# Patient Record
Sex: Female | Born: 1965
Health system: Southern US, Community
[De-identification: ages and names within clinical notes are randomized; demographics above are authoritative.]

## PROBLEM LIST (undated history)

## (undated) DIAGNOSIS — R45850 Homicidal ideations: Secondary | ICD-10-CM

## (undated) DIAGNOSIS — K219 Gastro-esophageal reflux disease without esophagitis: Secondary | ICD-10-CM

## (undated) DIAGNOSIS — F32A Depression, unspecified: Secondary | ICD-10-CM

## (undated) DIAGNOSIS — E039 Hypothyroidism, unspecified: Secondary | ICD-10-CM

## (undated) DIAGNOSIS — F329 Major depressive disorder, single episode, unspecified: Secondary | ICD-10-CM

## (undated) DIAGNOSIS — I10 Essential (primary) hypertension: Secondary | ICD-10-CM

## (undated) DIAGNOSIS — E119 Type 2 diabetes mellitus without complications: Secondary | ICD-10-CM

## (undated) DIAGNOSIS — F419 Anxiety disorder, unspecified: Secondary | ICD-10-CM

---

## 1999-07-20 ENCOUNTER — Inpatient Hospital Stay (HOSPITAL_COMMUNITY): Admission: EM | Admit: 1999-07-20 | Discharge: 1999-07-23 | Payer: Self-pay | Admitting: Psychiatry

## 1999-11-16 ENCOUNTER — Inpatient Hospital Stay: Admission: EM | Admit: 1999-11-16 | Discharge: 1999-12-17 | Payer: Self-pay | Admitting: Pulmonary Disease

## 1999-11-16 ENCOUNTER — Encounter: Payer: Self-pay | Admitting: Pulmonary Disease

## 1999-11-16 ENCOUNTER — Encounter: Payer: Self-pay | Admitting: Thoracic Surgery (Cardiothoracic Vascular Surgery)

## 1999-11-18 ENCOUNTER — Encounter: Payer: Self-pay | Admitting: Pulmonary Disease

## 1999-11-19 ENCOUNTER — Encounter: Payer: Self-pay | Admitting: Pulmonary Disease

## 1999-11-20 ENCOUNTER — Encounter: Payer: Self-pay | Admitting: Pulmonary Disease

## 1999-11-21 ENCOUNTER — Encounter: Payer: Self-pay | Admitting: Pulmonary Disease

## 1999-11-22 ENCOUNTER — Encounter: Payer: Self-pay | Admitting: Pulmonary Disease

## 1999-11-23 ENCOUNTER — Encounter: Payer: Self-pay | Admitting: Pulmonary Disease

## 1999-11-24 ENCOUNTER — Encounter: Payer: Self-pay | Admitting: Pulmonary Disease

## 1999-11-25 ENCOUNTER — Encounter: Payer: Self-pay | Admitting: Pulmonary Disease

## 1999-11-26 ENCOUNTER — Encounter: Payer: Self-pay | Admitting: Pulmonary Disease

## 1999-11-27 ENCOUNTER — Encounter: Payer: Self-pay | Admitting: Pulmonary Disease

## 1999-11-30 ENCOUNTER — Encounter: Payer: Self-pay | Admitting: Critical Care Medicine

## 1999-12-01 ENCOUNTER — Encounter: Payer: Self-pay | Admitting: Critical Care Medicine

## 1999-12-02 ENCOUNTER — Encounter: Payer: Self-pay | Admitting: Pulmonary Disease

## 1999-12-16 ENCOUNTER — Encounter: Payer: Self-pay | Admitting: Pulmonary Disease

## 1999-12-17 ENCOUNTER — Inpatient Hospital Stay (HOSPITAL_COMMUNITY): Admission: EM | Admit: 1999-12-17 | Discharge: 1999-12-20 | Payer: Self-pay | Admitting: Psychiatry

## 2001-10-16 ENCOUNTER — Inpatient Hospital Stay (HOSPITAL_COMMUNITY): Admission: EM | Admit: 2001-10-16 | Discharge: 2001-10-20 | Payer: Self-pay | Admitting: Psychiatry

## 2002-03-16 ENCOUNTER — Inpatient Hospital Stay (HOSPITAL_COMMUNITY): Admission: EM | Admit: 2002-03-16 | Discharge: 2002-03-20 | Payer: Self-pay | Admitting: Psychiatry

## 2003-04-14 ENCOUNTER — Inpatient Hospital Stay (HOSPITAL_COMMUNITY): Admission: AD | Admit: 2003-04-14 | Discharge: 2003-04-20 | Payer: Self-pay | Admitting: Psychiatry

## 2005-07-30 ENCOUNTER — Inpatient Hospital Stay (HOSPITAL_COMMUNITY): Admission: EM | Admit: 2005-07-30 | Discharge: 2005-08-03 | Payer: Self-pay | Admitting: Psychiatry

## 2005-07-30 ENCOUNTER — Ambulatory Visit: Payer: Self-pay | Admitting: Psychiatry

## 2006-03-01 ENCOUNTER — Ambulatory Visit: Payer: Self-pay | Admitting: *Deleted

## 2006-03-01 ENCOUNTER — Inpatient Hospital Stay (HOSPITAL_COMMUNITY): Admission: AD | Admit: 2006-03-01 | Discharge: 2006-03-08 | Payer: Self-pay | Admitting: *Deleted

## 2006-11-05 ENCOUNTER — Ambulatory Visit: Payer: Self-pay | Admitting: Psychiatry

## 2006-11-06 ENCOUNTER — Inpatient Hospital Stay (HOSPITAL_COMMUNITY): Admission: RE | Admit: 2006-11-06 | Discharge: 2006-11-12 | Payer: Self-pay | Admitting: Psychiatry

## 2007-02-04 ENCOUNTER — Ambulatory Visit: Payer: Self-pay | Admitting: Psychiatry

## 2007-02-04 ENCOUNTER — Inpatient Hospital Stay (HOSPITAL_COMMUNITY): Admission: AD | Admit: 2007-02-04 | Discharge: 2007-02-07 | Payer: Self-pay | Admitting: Psychiatry

## 2007-02-14 ENCOUNTER — Inpatient Hospital Stay (HOSPITAL_COMMUNITY): Admission: AD | Admit: 2007-02-14 | Discharge: 2007-02-21 | Payer: Self-pay | Admitting: Psychiatry
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
## 2010-06-03 NOTE — H&P (Signed)
NAMESUZANA, Jessica [name] NO.:  [id number]   MEDICAL RECORD NO.:  [id number]          PATIENT TYPE:  IPS   LOCATION:  0405                          FACILITY:  [facility]   PHYSICIAN:  Anselm Jungling, MD  DATE OF BIRTH:  1965/06/11   DATE OF ADMISSION:  11/06/2006  DATE OF DISCHARGE:                       PSYCHIATRIC ADMISSION ASSESSMENT   IDENTIFYING INFORMATION:  This is a voluntary admission to the services  of Dr. Geralyn Flash.  This is a 45 year old single white female.  The  patient was originally admitted to [facility] on November 03, 2006.  She  was found with a decreased level of consciousness after an intentional  drug overdose.  She took 45 40-mg tablets of Celexa.  This was after  getting into an argument with her father.  Apparently, he called her  stupid and she was treated with charcoal in the ED at [facility] and  admitted for further stabilization.  She was admitted to the ICU for  supportive care and was medically stable and allowed to transfer here to  the [facility] late yesterday, November 06, 2006.   PAST PSYCHIATRIC HISTORY:  She has had numerous prior inpatient stays,  both here, [facility] and [facility].  She was last with [place]  back in March 01, 2006 to March 08, 2006.   SOCIAL HISTORY:  She is a high school graduate in 1985.  She has never  married.  She has had no children.  She was last employed around age 48.  She gets disability and a check from her dad from his [employer].   FAMILY HISTORY:  Two of her brothers used alcohol and drugs.  She denies  using alcohol or drugs herself.   PRIMARY CARE PHYSICIAN:  Her primary care Jozette Castrellon is Dr. Harrison Mons.  She is  followed psychiatrically at [facility].   MEDICAL PROBLEMS:  Asthma, morbid obesity, hypothyroid,  hyperparathyroidism, hepatitis C and diabetes mellitus type 2.   MEDICATIONS:  Celexa 60 mg p.o. q.d., Topamax 100 mg, 1 p.o. q.a.m.,  1  p.o. q.p.m., calcitriol 0.5 mcg, 2 tablets daily, Synthroid 125 mcg p.o.  daily, hydrochlorothiazide 25 mg p.o. daily, Loxitane 25 mg, 1 p.o.  daily, 1-A-Day vitamin, Humalog mix 75/25, 12 units in the a.m., 14  units at lunch and 10 units at dinner, Lantus 36 units subcu daily.   ALLERGIES:  HALDOL.   POSITIVE PHYSICAL FINDINGS:  Her physical examination is well-documented  on the chart from [place].  On admission to our unit, her vital signs  showed that she is 4 feet 11 inches tall, she weighs 231 pounds,  temperature is 97.6, blood pressure is 132/85, pulse 79, respirations  are 16.  She has old scars from a prior tracheotomy, thyroid surgery and  a fracture of her elbow.  She is O2-dependent and she uses BiPAP at h.s.   MENTAL STATUS EXAM:  Today, she is alert and oriented x4.  She is  appropriately groomed, dressed and nourished.  Her speech is a normal  rate, rhythm and tone.  Her mood is appropriate  to the situation.  Her  affect remains somewhat blunted.  Thought processes are clear, rational  and goal-oriented.  She would like to have placements here in  [place].  Judgment and insight are fair.  Concentration and memory  are intact.  Intelligence is average.  She denies being actively  suicidal or homicidal.  She denies auditory or visual hallucinations.   DIAGNOSES:  AXIS I:  Schizophrenia.  Major depressive disorder.  AXIS II:  Deferred.  AXIS III:  Diabetes, type 2, asthma, hypothyroid, hyperparathyroidism,  hepatitis C.  AXIS IV:  Relationship issues with father, requiring placement changes.  AXIS V:  20.   PLAN:  To admit for further safety and stabilization.  Will have the  casemanager work on different placement here in [place] and will  adjust her medications as indicated.      Mickie Leonarda Salon, P.A.-C.      Anselm Jungling, MD  Electronically Signed    MD/MEDQ  D:  11/07/2006  T:  11/07/2006  Job:  [id number]

## 2010-06-06 NOTE — H&P (Signed)
[facility]  Patient:    Jessica [name], Jessica [name]                       MRN: [id number] Adm. Date:  [id number] Attending:  Marlyn Corporal Fabmy Dictator:   Johnella Moloney, N.P.                         History and Physical  IDENTIFYING INFORMATION:  Ms. Smoot is a 45 year old white single female, admitted December 17, 1999 on a voluntary basis after overdosing on Seroquel and Synthroid.  The chart lists two different times that she took the overdose, either November 16, 1999 or December 02, 1999; patient is unable to provide this information for me.  HISTORY OF PRESENT ILLNESS:  Patient apparently overdosed on Seroquel and Synthroid approximately two weeks ago.  She states she did it because "Im tired of living and tired of having schizophrenia."  The overdose apparently resulted in aspiration pneumonia with chronic respiratory failure; subsequently, she was hospitalized on a medical unit for approximately the past two weeks.  Patient currently denies suicidal ideation or intent.  She states, "I learned my lesson."  She reports getting angry when she gets upset. She did acknowledge that she slept well last night.  Appetite is good.  She did say last night that she saw "dead people trying to grab me and put me in the grave and blood dripping from the ceiling."  She states she believes that this is true since "I am a schizophrenic."  Also, reports hallucinations last night, "they told me to harm people, but I would never do that."  She states she is happy now; denies depression.  She says she does not plan to overdose again, "Ive learned my lesson."  She states she always has auditory and visual hallucinations and they generally do not go away.  Patient has a history of multiple suicide attempts by overdose, cutting wrists.  This patient is an unreliable historian, cannot remember a lot of things, so I do not think this information is as accurate as should be.  I am  getting my information from the chart, i.e., the assessment team and some of the notes that were sent to [facility].  PAST PSYCHIATRIC HISTORY:  Patient currently goes to [facility] and sees Dr. Hinton Lovely, Montez Hageman. and Myriam Forehand, who is her therapist.  She has been going there one year.  She states she saw Dr. Eliezer Bottom for four years, last seen in 1999.  Multiple hospitalizations including [facility], [facility] [place], [facility] and apparently some others which she cannot remember.  PAST MEDICAL HISTORY:  Patients primary care doctor is Dr. Nadine Counts in [place], who she said she saw two weeks ago.  Medical problems include obesity, diabetes mellitus, type 2, Pseudomonas pneumonia (aspiration pneumonia related to overdose), hypercholesterolemia, hepatitis C -- she said this was diagnosed 12 years ago, status post tracheotomy, November 2001, thyroidectomy in 1983 due to Graves disease, shortness of breath and asthma.  MEDICATIONS PRIOR TO THE OVERDOSE:  Seroquel, Celexa, Lamictal, Neurontin, Synthroid; also, O2 2 L via nasal cannula.  DRUG ALLERGIES:  HALDOL -- she does not know what happens when she takes Haldol.  SOCIAL HISTORY:  Patient lives with her parents in [place].  Two brothers and two sisters.  She is single, completed the 12th grade, apparently has some type of learning disability and disabled  since her 65s.  FAMILY HISTORY:  Brother is a paranoid schizophrenic.  Her paternal grandfather is schizophrenic.  ALCOHOL/DRUG HISTORY:  Patient denies alcohol abuse, no substance abuse and is a nonsmoker.  POSITIVE PHYSICAL FINDINGS:  Please see [facility] and medical unit records.  She apparently was admitted there December 02, 1999 through December 17, 1999 and please refer to their notes.  CBG this morning was 141.  Temperature 98, pulse 117, respirations 24, blood pressure  130/82.  CURRENT MENTAL STATUS EXAMINATION:  Short, obese white female with an nasal cannula oxygen, dressed in her pajamas, cooperative, however, and good eye contact; however, she appears to demand a lot of attention from staff.  Speech somewhat pressured; she talks nonstop; it is usually relevant.  Mood euthymic. Affect is euthymic.  She denies current suicidal ideation or intent.  She did tell me that the voices are telling her to hurt someone; however, she states she ignores them.  Thought process:  She is having auditory hallucinations as of last night and also visual hallucinations as of last night.  No apparent delusions currently.  Cognitive:  Alert and oriented.  Her judgement is poor. Impulse control is poor.  Cognitive function appears to be consistent with her level of intellectual functioning.  CURRENT DIAGNOSES Axes I:    1. Schizophrenia, chronic undifferentiated type.            2. Major depression, recurrent. Axis II:   Deferred. Axes III:  1. Diabetes mellitus.            2. Aspiration pneumonia, status post tracheotomy, November 2001.            3. History of a thyroidectomy, hepatitis C and asthma. Axis IV:   Severe, related to her mental illness and medical problems. Axis V:    Current 45 and probably highest in the past year is 55.  TREATMENT PLAN AND RECOMMENDATION:  Voluntary admission to [facility], check every 15 minutes, maintain safety -- patient is a high fall risk.  Will do CBGs a.c. breakfast and supper and coordinate this with Regular insulin sliding scale.  Start her on Geodon 20 mg p.o. b.i.d., Restoril 30 mg p.o. p.r.n. for sleep, also use the incentive spirometer q.2h. when awake, also pulse oximeter every shift, albuterol nebulizer 2.5 mg _____ q.i.d., Protonix 40 mg p.o. b.i.d., ______ 5 mg p.o. q.a.m., levothyroxine 100 mcg p.o. q.d., Colace 100 mg b.i.d., sodium chloride nasal spray, two sprays b.i.d., Neurontin 300 mg  q.i.d., Lamictal 25 mg two q.a.m. and 100 mg at h.s., 70/30 insulin subcu 28 units a.c. b.i.d., Regular scale insulin p.r.n. a.c. breakfast and supper, Artificial Tears p.r.n., albuterol 2.5 mg  nebulizer solution q.3h. p.r.n., Ativan 2 mg p.o. or IM q.4h. p.r.n.  TENTATIVE LENGTH OF STAY AND DISCHARGE PLAN:  Three days. DD:  12/18/99 TD:  12/18/99 Job: [id number]

## 2010-06-06 NOTE — Discharge Summary (Signed)
NAME:  Jessica [name], Jessica [name] NO.:  [id number]   MEDICAL RECORD NO.:  [id number]          PATIENT TYPE:  IPS   LOCATION:  0401                          FACILITY:  [facility]   PHYSICIAN:  Geoffery Lyons, M.D.      DATE OF BIRTH:  September 09, 1965   DATE OF ADMISSION:  07/30/2005  DATE OF DISCHARGE:  08/03/2005                                 DISCHARGE SUMMARY   CHIEF COMPLAINT AND PRESENT ILLNESS:  This was the second admission to [facility] for this 45 year old single white female voluntarily  admitted.  History of psychosis.  She had a nervous breakdown.  Became very  agitated over everything and everyone.  Tired of her illness.  Tired of  all the medications she has to take.  Sudden bout of depression, was hearing  noises, vibrations, or speaker-type noises.  Experiencing visual  hallucinations, seeing vampires and blood on the wall.  She was hearing  voices telling her to overdose.   PAST PSYCHIATRIC HISTORY:  Second admission.  Diagnosed with schizophrenia.   ALCOHOL/DRUG HISTORY:  Denies active use of any substances.   MEDICAL HISTORY:  Insulin-dependent diabetes mellitus, hepatitis C.   MEDICATIONS:  Neurontin 400 mg four times a day, Celexa 40 mg per day,  Synthroid 88 mcg daily, Abilify 10 mg at night, Risperdal 0.5 mg in the  morning, trazodone 50 mg at night, Humalog insulin 15 units subcutaneous in  the morning and 18 units subcutaneous at lunch, 18 units in the evening, and  Lantus 42 units at bedtime.   PHYSICAL EXAMINATION:  Performed and failed to show any acute findings.   LABORATORY DATA:  CBC with white blood cells 10.0, hemoglobin 14.1.  Blood  chemistry with sodium 137, potassium 3.8, glucose 177.  Liver enzymes with  SGOT 42, SGPT 47.  Drug screen negative for substances of abuse.   MENTAL STATUS EXAM:  Alert, cooperative female.  Fully alert.  Good eye  contact.  Speech clear, normal rate, tempo and production.  Mood anxiety.  Affect  anxiety.  Thought processes logical, coherent and relevant.  Endorsing hallucinations, fears, worries, concerns but no evidence of  delusions.  Cognition was well-preserved.   ADMISSION DIAGNOSES:  AXIS I:  Schizoaffective disorder.  AXIS II:  Borderline intellectual functioning.  AXIS III:  Hypothyroidism, diabetes mellitus.  AXIS IV:  Moderate.  AXIS V:  GAF upon admission 35; highest GAF in the last year 65-70.   HOSPITAL COURSE:  She was admitted.  She was started in individual and group  psychotherapy.  She was maintained on her medications.  Neurontin was placed  at 300 mg three times a day and 600 mg at bedtime.  She endorsed that she  was doing well for a long time.  Was switched from Seroquel to Abilify.  Did  work for a year.  Recently increased to 3 tabs.  Cannot identify a possible  stressor.  Got very upset.  Mood swings with hallucinations.  On July 31, 2005, she was very sedated by the medication.  We moved medications around.  By  the 14th, she was better.  Feeling that the medications were working,  interacted with other patients and staff.  On August 03, 2005, she was in full  contact with reality.  There were no suicidal or homicidal ideation, no  hallucinations, no delusions.  She felt better.  Her mood improved.  Affect  was brighter, broader.  Sleeping through the night.  No hallucinations.  We  went ahead and discharged to outpatient follow-up.   DISCHARGE DIAGNOSES:  AXIS I:  Schizoaffective disorder.  AXIS II:  Rule out borderline intellectual functioning.  AXIS III:  Hypothyroidism, insulin-dependent diabetes mellitus, hepatitis C.  AXIS IV:  Moderate.  AXIS V:  GAF upon discharge 50.   DISCHARGE MEDICATIONS:  1. Celexa 40 mg per day.  2. Abilify 10 mg at bedtime.  3. Risperdal 0.5 mg at 6 p.m.  4. Trazodone 50 mg at night.  5. Neurontin 300 mg three times a day and 600 mg at bedtime.  6. Synthroid 88 mcg daily.  7. Continue insulin as scheduled.    FOLLOWUP:  [facility].      Geoffery Lyons, M.D.  Electronically Signed     IL/MEDQ  D:  08/14/2005  T:  08/15/2005  Job:  [id number]

## 2010-06-06 NOTE — H&P (Signed)
NAME:  STORIE, HEFFERN NO.:  [id number]   MEDICAL RECORD NO.:  [id number]          PATIENT TYPE:  IPS   LOCATION:  0401                          [facility]ACILITY[facility]  [facility]   PHYSICIAN:  Geoffery Lyons, M.D.      DATE OF BIRTH:  06/06/1965   DATE OF ADMISSION:  07/30/2005  DATE OF DISCHARGE:  08/03/2005                         PSYCHIATRIC ADMISSION ASSESSMENT   IDENTIFYING INFORMATION:  A 45 year old single white female voluntarily  admitted on July 29, 2005.   HISTORY OF PRESENT ILLNESS:  The patient presents with a history of  psychosis.  The patient states she had a nervous breakdown and became very  agitated over everything and everyone.  She states she is tired of her  illness and tired of all the medications she has to take.  She states she  had a sudden bout of depression, was hearing noises, vibrations or speaker-  type noises, also experiencing visual hallucinations, seeing vampires and  blood on the walls.  The patient states she was hearing voices telling her  to overdose and the patient states all she wants to do is just to get  better.   PAST PSYCHIATRIC HISTORY:  The patient's second admission, has a history of  borderline personality and schizophrenia.  She sees Dr. Claudie Revering at [facility].  The patient has a history of overdosing.  The patient also  has a therapist named Lauree Chandler.   SOCIAL HISTORY:  She is a 45 year old single white female with no children.  She lives alone.  She has a 12th grade education currently on disability.   FAMILY HISTORY:  Brother with schizophrenia and bipolar disorder.   ALCOHOL DRUG HISTORY:  Nonsmoker, denies any alcohol or drug use.   PAST MEDICAL HISTORY:  Primary care Librada Castronovo is Dr. Harrison Mons in [place]. Also  has an endocrinologist, Dr. Lelan Pons in [place]. Medical problems are insulin-  dependent diabetes mellitus, hepatitis C.  The patient was recently  diagnosed with her diabetes.   MEDICATIONS:  Neurontin  400 mg q.i.d., Celexa 40 mg daily, Synthroid 88 mcg  daily, Abilify 10 mg at bedtime, Risperdal 0.5 mg in the morning, trazodone  50 mg at bedtime, Humalog insulin 15 units subcutaneous in the morning,  Humalog insulin 18 units subcutaneous at lunch, Humalog 18 units  subcutaneous in the evening.  Also takes Lantus 42 units at bedtime.   DRUG ALLERGIES:  No known allergies.   PHYSICAL EXAMINATION:  The patient was fully assessed at [facility].  This is a short-statured middle-aged female in no acute distress.  Her  temperature was 98, pulse of 100, 20 respirations, blood pressure 114/81..  She is 252 pounds, approximately 5 feet 4 inches tall.  Urine pregnancy test  was negative, alcohol level less than 0.1, acetaminophen level less than 10,  glucose was 177.  Her RDW is elevated at 15.1.   MENTAL STATUS EXAM:  She is fully alert, cooperative, good eye contact,  casually dressed.  Speech is clear, normal rate and tone.  The patient's  mood is neutral.  The patient is displaying some mild  anxiety.  Thought  process: Endorsing positive auditory or visual hallucinations, cognitive  function intact.  Her memory is good, judgment and insight is fair.   ADMISSION DIAGNOSES:  AXIS I:  Schizoaffective disorder.  AXIS II:  Borderline personality per chart and per patient.  AXIS III: Hypothyroidism, insulin-dependent diabetes mellitus, and hepatitis  C.  AXIS IV:  Other psychosocial problems, medical problems.  AXIS V:  Current is 35.   PLAN:  Plan is to stabilize mood and thinking.  We will resume her  medications, will check her blood sugars a.c. and h.s., put the patient on  sliding scale.  At this time continue with her Lantus.  The patient is to  follow up with her providers as advised.  Emotional reassurance.   TENTATIVE LENGTH OF CARE:  5-7 days.      Landry Corporal, N.P.      Geoffery Lyons, M.D.  Electronically Signed    JO/MEDQ  D:  08/03/2005  T:  08/03/2005  Job:   [id number]

## 2010-06-06 NOTE — Op Note (Signed)
[facility]  Patient:    Jessica [name], Jessica [name]                       MRN: [id number] Proc. Date: 11/27/99 Adm. Date:  [id number] Attending:  Merwyn Katos CC:         Barbaraann Share, M.D. [facility]   Operative Report  PREOPERATIVE DIAGNOSES: 1. Ventilatory dependent. 2. Acute respiratory distress syndrome. 3. Drug overdose.  POSTOPERATIVE DIAGNOSES: 1. Ventilatory dependent. 2. Acute respiratory distress syndrome. 3. Drug overdose.  OPERATION:  Tracheotomy.  SURGEON:  Veverly Fells. Arletha Grippe, M.D.  ASSISTANT:  Kinnie Scales. Annalee Genta, M.D.  ANESTHESIA:  General endotracheal.  INDICATION FOR PROCEDURE:  This is a 45 year old white female with multiple medical problems who has been intubated for the past two weeks despite aggressive attempts at weaning by the pulmonary service and due to her lung function and ARDS and pulmonary edema, she continues to be ventilatory dependent.  Based on her history and physical examination and failure of weaning, we have recommended proceeding with a tracheotomy tube placement.  I have discussed extensively with the family, the risks and benefits for surgery including risk of general anesthesia, infection, bleeding, injury to larynx and trachea and normal recovery period after this type of surgery.  I have entertained questions and answered them appropriately.  Informed consent has been obtained.  The patient presents for the above noted procedure.  OPERATIVE FINDINGS:  Normal anatomy.  DESCRIPTION OF PROCEDURE:  The patient was brought to the operating room and placed in the supine position.  General endotracheal anesthesia administered via the anesthesiologist without complications.  Her shoulders were placed on a shoulder roll.  Her head was placed on a donut.  The patients back was sterilely prepped and draped in a standard fashion.  Prior skin incision where the thyroidectomy had been performed was noted and an  incision approximately 2 to 3 cm in length just above this incision was marked out using a marking pen. It was injected with approximately 4 cc of 1% Lidocaine solution and 1:100,000 epinephrine.  Skin incision was made with a scalpel blade and subcutaneous tissues were dissected free using electrocautery.  Bleeding was controlled with electrocautery.  Strap muscles were identified in the midline and divided and retracted laterally.  Thyroid isthmus had already been resected due to a prior thyroidectomy, therefore an isthmectomy was not needed.  Bleeding was controlled in the area with electrocautery without difficulty.  An incision between the second and third intercartilaginous rings was made with a scalpel blade.  Inferiorly a base trap door flap was created with lateral cuts through the inferior incision of the third ring.  This was sutured lightly to the anterior neck of the skin with a 2-0 silk stitch.  Bleeding from the area of the trachea was controlled with electrocautery without difficulty. The nurse anesthetist was instructed to remove the endotracheal tube slowly.  Once she had it passed above the opening of the tracheotomy site, a size 6 cuff nonfenestrated Shiley tracheal tube was placed within the incision without difficulty.  The cuff was inflated. The inner cannula was placed and it was hooked up to the anesthesia circuit.  Breath sounds were also obtained in both lung fields with good pCO2 return on the monitor and good oxygenation was noted.  The tracheostomy was then sutured using four quadrant silk sutures to the anterior neck skin. A piece of Xeroform gauze was placed underneath the tracheostomy flange  and a foam tracheostomy tie was placed around the patients neck.  Fluids given during the procedure were approximately 300 cc of crystalloid.  Estimated blood loss was minimal.  Urine output not measured. There were no drains.  Above noted Xeroform packing was placed.  No  specimens sent.  The patient tolerated the procedure well without complications and was transferred back, intubated to the 2100 intensive care unit in stable condition.  Sponge, instrument and needle counts were correct during the procedure.  Total duration of the procedure is approximately one hour. It is of note, that portable chest x-ray confirmed placement of the tracheotomy tube and really a pneumothorax is still pending at the time of this dictation. DD:  11/27/99 TD:  11/28/99 Job: [id number]

## 2010-06-06 NOTE — Discharge Summary (Signed)
NAME:  Jessica [name], Jessica [name] NO.:  [id number]   MEDICAL RECORD NO.:  [id number]                   PATIENT TYPE:  IPS   LOCATION:  0506                                 [facility]ILITY[facility]   PHYSICIAN:  Geoffery Lyons, M.D.                   DATE OF BIRTH:  24-May-1965   DATE OF ADMISSION:  03/16/2002  DATE OF DISCHARGE:  03/20/2002                                 DISCHARGE SUMMARY   CHIEF COMPLAINT AND PRESENT ILLNESS:  This was one of multiple admissions to  [facility] for this 45 year old white female,  single, voluntarily admitted.  She was claiming, I don't want to live  anymore.  She overdosed on Seroquel and other psychotropic medications.  She had a phone conversation immediately prior to the overdose with her best  friend that upset her.  Apparently, the best friend told her they could not  be friends anymore; no clear reason why.  She got upset as she also lost  another friend to suicide a couple of years before.  This was the last  friend she had.  Upon this initial evaluation, she endorsed suicidal ideas  but no plan, willing to contract for safety.   PAST PSYCHIATRIC HISTORY:  She had multiple admissions to [facility].  She had a long history of chronic  undifferentiated schizophrenia.  She was followed by [facility].   SUBSTANCE ABUSE HISTORY:  She denied the use or abuse of any substances.   PAST MEDICAL HISTORY:  1. Hepatitis C.  2. Grave's disease.  3. Hyperparathyroidism.  4. Diabetes mellitus type 2.  5. Obesity.  6. Sleep apnea.   MEDICATIONS:  1. Synthroid 112 mcg daily.  2. Celexa 40 mg per day.  3. Risperdal 1 mg at night.  4. Calcitriol 0.5 mcg one in the morning.  5. Calcium carbonate 500 mg three times a day.  6. Seroquel 200 mg one in the morning and 600 mg at night.  7. Humulin insulin 22 units in the morning and 25 units at supper.   PHYSICAL  EXAMINATION:  Physical examination was performed, failed to show  any acute findings.   MENTAL STATUS EXAM:  Mental status exam revealed an obese female, blunted  affect, poor concentration, cooperative, poor eye contact, generally  detached affect.  She appeared to have an apathetic manner.  Speech was  normal.  Mood was depressed, frustrated, grieving, very distressed about the  loss of her friend.  She felt she could be safe in the hospital but not at  home.  She claimed to feel tired as she could not stand to live without her  friend.  Logical and coherent, although very concrete.  Positive for  suicidal ideas, no plan, no homicidal ideas.  Cognitive: Cognition was well  preserved.    ADMISSION DIAGNOSES:  AXIS I:  Schizophrenia, chronic, undifferentiated.   AXIS II:  Borderline intellectual functioning.   AXIS III:  1. Hepatitis C.  2. Edema.  3. Diabetes mellitus type 2.  4. Grave's disease.  5. Hyperparathyroidism.   AXIS IV:  Moderate.   AXIS V:  Global assessment of functioning upon admission 28, highest global  assessment of functioning in the last year 55-60.   LABORATORY DATA:  Other laboratory workup: Thyroid profile was within normal  limits.  Pregnancy test was negative.  Other lab values were within normal  limits.   HOSPITAL COURSE:  She was admitted and started in intensive individual and  group psychotherapy.  She was basically kept on her medications: Humulin  insulin 70/30 22 units in the morning and 25 units in the afternoon,  Seroquel 200 mg in the morning and 600 mg at night, Risperdal 1 mg in the  afternoon, Neurontin 400 mg four times a day, calcitriol 0.5 mg in the  morning, calcium carbonate 500 mg three times a day, Synthroid 112 mcg  daily.  She evidenced some agitation that required that dose of Seroquel.  She did admit to multiple issues but especially the loss of the  relationship.  She continued to evidence depression, suicidal  ruminations,  but then she started getting involved in the individual and group process on  the unit.  She was able to talk about the grief and loss.  She started  feeling better.  She denied any active suicidal or homicidal ideas.  There  were no delusions, no hallucinations.  She started to request to be  considered for discharge.  On March 1, she was in full contact with reality,  no suicidal ideas, no homicidal ideas, dealt with the loss of the  relationship.  She felt she was ready to move on.  She would continue to  work on an outpatient basis.   DISCHARGE DIAGNOSES:   AXIS I:  Schizophrenia, chronic, undifferentiated, by history.   AXIS II:  Borderline intellectual functioning.   AXIS III:  1. Hepatitis C.  2. Edema.  3. Diabetes mellitus type 2.  4. Grave's disease.  5. Hyperparathyroidism.   AXIS IV:  Moderate.   AXIS V:  Global assessment of functioning upon discharge 50.   DISCHARGE MEDICATIONS:  1. Lasix 20 mg per day.  2. Insulin Humulin 70/30 35 units in the morning and 28 in the afternoon.  3. Seroquel 200 mg one at night and three at bedtime.  4. Risperdal 1 mg at bedtime.  5. Neurontin 400 mg four times a day.  6. Rocaltrol 0.5 mg daily.  7. Synthroid 112 mcg daily.  8. __________  40 mg daily.   FOLLOW UP:  She was to follow up at [facility].                                                 Geoffery Lyons, M.D.    IL/MEDQ  D:  04/19/2002  T:  04/19/2002  Job:  [id number]

## 2010-06-06 NOTE — Discharge Summary (Signed)
[facility]  Patient:    Jessica [name], Jessica [name]                       MRN: [id number] Adm. Date:  [id number] Disc. Date: [id number] Attending:  Marlyn Corporal Fabmy                           Discharge Summary  ADMISSION DIAGNOSES: Axis I:    1. Schizophrenia, chronic, undifferentiated-type.            2. Major depression, recurrent. Axis II:   Deferred. Axis III:  1. Diabetes mellitus.            2. Aspiration pneumonia.            3. History of thyroidectomy.            4. Hepatitis C.            5. Bronchial asthma. Axis IV:   Severe (related to her mental illness). Axis V:    Global Assessment of Functioning 45 on admission; 50-55 during the            past year.  DISCHARGE DIAGNOSES: Axis I:    1. Schizophrenia, chronic, undifferentiated-type.            2. Major depression, recurrent. Axis II:   Deferred. Axis III:  1. Diabetes mellitus.            2. Aspiration pneumonia with status post tracheostomy on November               2001.            3. History of thyroidectomy.            4. Hepatitis C.            5. Bronchial asthma. Axis IV:   Severe. Axis V:    Global Assessment of Functioning 45 on admission, 50 on discharge.  BRIEF HISTORY:  This is one of several psychiatric admissions for this 45 year old single Caucasian female who lives with her parents.  Patient overdosed on Seroquel and Synthroid and developed aspiration pneumonia which was managed at [facility] with a tracheostomy.  She is now transferred to the Behavioral Unit for further evaluation and management.  At the time of her admission, she denied suicidal ideation.  She was apparently hallucinating and seeing that people trying to grab her and put her in a grave with blood dripping from the ceiling.  In terms of her suicidal aspirations, the patient stated that she has learned her lesson and that she will not overdose again.  Patient is under the care of Dr. ________ at  [facility].  PERTINENT PHYSICAL AND LABORATORY DATA:  Laboratory data was not done since she had extensive lab work at [facility].  Patient was maintained on oxygen 2 liters and maintained on the medications that she was discharged on from [facility] throughout her hospitalization.  COURSE IN HOSPITAL:  Patient was limited, needy, dependent and demanding. Otherwise, she did not show any evidence of phenomic behavior and was compliant and pleasant throughout her hospital stay.  She seemed to tolerate her medications well and with benefit.  CONDITION ON DISCHARGE:  Patient is pleasant, interactive and shows evidence of good reality connectedness.  There is no evidence of any hallucinations and any fear and she is not delusional.  Affect is full.  She  regrets her overdose and promises never to overdose again.  MEDICATION AND FOLLOW-UP:  Patient is discharged with prescriptions for oxygen 2 liters via nasal cannula, Geodon 2 mg b.i.d., albuterol inhaler 2.5 mg q.i.d., Protonix 40 mg b.i.d., glipizide XL 5 mg daily, levothyroxine 100 mcg daily, Colace 100 mg b.i.d., sodium chloride nasal spray b.i.d., Neurontin 300 mg q.i.d., lamotrigine 25 mg b.i.d., lamotrigine 100 mg q.h.s. and insulin SL 70/30 28 U b.i.d.  She is to follow with mental health and with her own physician.  She is to call me should a crisis develop. DD:  12/20/99 TD:  12/20/99 Job: [id number]

## 2010-06-06 NOTE — Discharge Summary (Signed)
[facility]  Patient:    Jessica [name], Jessica [name]                       MRN: [id number] Adm. Date:  [id number] Disc. Date: [id number] Attending:  Marlyn Corporal Fabmy                           Discharge Summary  ADMISSION DIAGNOSES: Axis I:    Paranoid schizophrenia. Axis II:   Learning disabled. Axis III:  1. Type 1 diabetes mellitus.            2. History of thyroid surgery.            3. Possible hepatitis C. Axis IV:   Severe (problems with primary support group). Axis V:    GAF 20 on admission; 55 during past year.  DISCHARGE DIAGNOSES: Axis I:    Paranoid schizophrenia. Axis II:   Learning disability. Axis III:  1. Type 1 diabetes mellitus.            2. History of thyroid surgery.            3. Possible hepatitis C. Axis IV:   Severe. Axis V:    GAF 20 on admission; 50-55 on discharge.  BRIEF HISTORY:  The patient is a 45 year old single Caucasian female admitted voluntarily to my service from [facility] where she presented expressing suicidal and homicidal ideas.  The patient had a plan to overdose on her insulin and also had a homicidal plan to burn her house down with family inside or to shoot her family and her neighbors.  Patient was able to contract for her own safety on admission.  PERTINENT PHYSICAL AND LABORATORY DATA:  Physical examination, on admission, was unremarkable.  We ordered magnesium and phosphorus levels.  Phosphorus level was elevated at 5.1 with a magnesium being normal.  Thyroid profile showed moderately high TSH at 7.219 and she was advised to follow with her internist with another thyroid level in a months time.  COURSE IN HOSPITAL:  Patient was placed on Neurontin 300 mg q.a.m. and 300 mg q.h.s., Synthroid 0.075 mg q.d., 70/30 insulin 24 U q.a.m. and 20 U q.p.m., Seroquel 200 mg q.a.m. and 3 q.h.s. and Lamictal 100 mg 1/2 q.a.m. and q.h.s. She had Accu-Cheks q.a.c. and q.h.s. and was felt to be a fall risk  and treated accordingly.  Later, Celexa 40 mg q.h.s., trazodone 50 mg q.h.s. p.r.n. and calcium 500 mg q.i.d. with vitamin D 1000 mg q.d. was added to her medication.  She was placed on a course of amoxicillin.  Initially suicidal and homicidal, the patient rapidly compensated after a good nights sleep. The next day shows irritable outburst and stated that her brother had upset her and she lost her temper.  Within a day or two, she had showed me that there was no validity to her threats.  CONDITION ON DISCHARGE:  The patient was much calmer and she was sleeping through the night.  She denies experiencing any hallucinations.  Her thinking was tight and goal directed.  There was no evidence of any schneiderian criteria.  Her affect was full.  Her mood was euthymic.  There was no evidence of impulsivity.  She was tolerating her medication without evidence of EPS, tardive dyskinesia or sedation.  MEDICATION AND FOLLOW-UP:  Patient was discharged with prescriptions for Os-Cal with vitamin D q.i.d., amoxicillin 500 mg t.i.d. x 10 days,  Celexa 40 mg q.d., Lamictal 100 mg 1/2 b.i.d., Seroquel 200 mg 1 q.a.m. and 3 q.a.m., 70/20 insulin 24 U q.a.m. and 20 U at 4 p.m., Synthroid 0.075 mg q.d. and Neurontin 300 mg 2 q.a.m. and 3 q.h.s.  She was to follow at mental health and to see her internist, Dr. Harrison Mons.  She was given a copy of her thyroid profile. She was advised not to drink any alcoholic beverages. DD:  09/19/99 TD:  09/22/99 Job: [id number]

## 2010-06-06 NOTE — Discharge Summary (Signed)
NAMEMarland [name]  [name], Jessica NO.:  [id number]   MEDICAL RECORD NO.:  [id number]          PATIENT TYPE:  IPS   LOCATION:  0404                          [facility]ACILITY:  [facility]   PHYSICIAN:  Jasmine Pang, M.D. DATE OF BIRTH:  June 23, 1965   DATE OF ADMISSION:  03/01/2006  DATE OF DISCHARGE:  03/08/2006                               DISCHARGE SUMMARY   IDENTIFYING INFORMATION:  [age]-year-old single white female who was  admitted on a voluntary basis on March 01, 2006.   HISTORY OF PRESENT ILLNESS:  Patient has a history of schizophrenia.  She presented in the ED feeling unstable and frustrated.  She had not  been sleeping.  She stated the neighbors are causing conflict with a lot  of noise and she has been unable to sleep properly for 2 weeks.  She is  feeling out of control with homicidal ideation towards the neighbors.  There is no plan.  The patient is seen in outpatient treatment at  [facility] by a therapist and psychiatrist.  This is  her third [facility] admission. As indicated above, she has a history of  schizophrenia.  She is currently on Abilify 30 mg p.o. nightly, Celexa  60 mg in the morning, Topamax 100 mg twice daily, trazodone 100 mg p.o.  nightly, and Risperdal 1 tablet at 5:00 p.m. and 1 tablet daily as  needed for agitation or paranoia.   PHYSICAL FINDINGS:  Patient suffers from a dermatitis and diabetes type  1, which is controlled.  She also suffers from obesity and  hypothyroidism.  Her physical exam was done in the ED and there were no  acute medical problems.   ADMISSION LABORATORIES:  Comprehensive metabolic panel was positive for  low potassium of 3 (3.5-5.1), chloride of 94 (96-112), carbon dioxide 33  (19-32), glucose 128 (70-99), and ALT of 41 (0-35).  The rest of the  comprehensive metabolic panel was within normal limits.  CBC was  remarkable for a slightly low RBC count of 3.7 (3.87-5.11).  Platelet  count was low at 100  (150-400).  TSH was 5.206 which was within normal  limits.  A corrected potassium was 4.  Platelet count is pending.   HOSPITAL COURSE:  Upon admission, patient was continued on Abilify 30 mg  p.o. nightly, Celexa 60 mg p.o. q.a.m., Topamax 100 mg q.a.m. and h.s.,  Synthroid 100 mcg in the morning, Calcium D 1 pill daily, trazodone 100  mg at h.s., Risperdal 1 tablet p.o. at 1700 and 1 tablet p.r.n. daily as  needed.  On February 11,2008 patient was placed on hydrochlorothiazide  25 mg 1 tab weekly.  She was also changed to Risperdal 0.5 mg 1 tablet  at 1700 and 0.5 mg p.o. p.r.n. agitation or psychosis.  On March 02, 2006, Risperdal was increased to 1 M-Tab at 5:00 p.m. and 1 M-Tab once  daily p.r.n. agitation.  She was also placed on Lantus insulin 36 units  p.o. nightly, and placed on a glycemic control protocol.  She was  ordered O2 1 liter per minute via nasal  cannula around the clock.  On  March 03, 2006 she was ordered K-Dur 40 mEq p.o. b.i.d. x1 day.  Potassium was to be rechecked.  On March 04, 2006, patient was  started on loxapine 25 mg p.o. nightly with discontinuation of the  Risperdal standing dose.  On March 07, 2005, a repeat platelet count  was ordered but it had not returned at the time of this dictation.  Patient tolerated her medications well with no significant side effects.   Upon first meeting patient, she discussed being unable to sleep because  of her neighbors playing music for the past 2 weeks.  She complained to  the landlord but states he did nothing.  She is apparently now paranoid  about her neighbors wanting to jump her.  She admits increased anger  towards them and having some homicidal thoughts towards them.  She  denies substance abuse.  Information from the patient's landlord was  that there is no loud music playing or neighbors being loud and that she  is apparently hearing voices.  On March 03, 2006, patient states she  felt somewhat  better.  She indicated that a her mother and brother felt  it was my voices making the music.  She denied auditory/visual  hallucinations but had heard music last night.  She had no suicidal or  homicidal ideation.  On March 04, 2006, patient slept well.  She was  eating better.  She felt now that the noise she heard was due to her  voices.  At first she had thought they were real she stated.  She does  not feel quite ready to return to the apartment yet.  On March 05, 2006, patient was in a good mood, because she was happy about a plan to  go home and live with her mother.  She states she always enjoyed living  there, and her mother reports to case manager that she has enjoyed  having Lori there.  The patient states she moved out because she had  some difficulty getting along with her father, but this is resolved now.  The therapist from the mental health center called to speak to our case  manager.  Patient's Risperdal had been discontinued and changed to  loxapine as indicated above.  She was having no psychosis.  On February  16 and March 07, 2006, patient's mental status continued to be  improved.  The.  She was excited about going home.  On March 08, 2006, mental status improved markedly from admission status.  The  patient was friendly and cooperative.  She had good eye contact.  Speech  normal rate and flow.  Psychomotor activity was within normal limits.  Her mood was euthymic.  Affect wide range.  There was no suicidal or  homicidal ideation.  No thoughts of self-injurious behavior.  No  auditory or visual hallucinations.  No paranoia or delusions.  Thoughts  were logical and goal-directed.  Thought content predominantly happy  bout going home to live with her mother and father.  Cognitive was  grossly back to baseline.   DISCHARGE DIAGNOSES:  AXIS I:  Schizophrenia paranoid type.  AXIS II:  None. AXIS III:  Diabetes mellitus type 1, controlled; dermatitis;  obesity;  hypothyroidism, thrombocytopenia.  AXIS IV:  Moderate (problems related to social environment, housing  problem, burden of psychiatric illness, medical problems).  AXIS V: Global assessment of functioning upon admission was 25.  Global  assessment of functioning upon discharge  was 50.  Global assessment of  functioning highest past year was 60.   DISCHARGE/PLAN:  There were no specific activity level restrictions, and  dietary restrictions revolved around needing a diabetic diet with no  extra sugar.   POST HOSPITAL CARE PLANS:  [facility].  This  appointment is being arranged by the patient's case manager here.   DISCHARGE MEDICATIONS:  1. Abilify 30 mg at bedtime.  2. Topamax 100 mg in the morning and at bedtime.  3. Levothyroxine 100 mcg daily in the morning.  4. Os-Cal Plus D daily.  5. Trazodone 100 mg at bedtime.  6. Celexa 40 mg 1-1/2 pills daily.  7. Lantus insulin 36 mg p.o. nightly.  8. Loxitane 25 mg at bedtime.  9. Hydrochlorothiazide 25 mg weekly.      Jasmine Pang, M.D.  Electronically Signed     BHS/MEDQ  D:  03/08/2006  T:  03/08/2006  Job:  [id number]

## 2010-06-06 NOTE — H&P (Signed)
NAME:  Jessica [name], Jessica [name] NO.:  [id number]   MEDICAL RECORD NO.:  [id number]                   PATIENT TYPE:  IPS   LOCATION:  0400                                 [facility]ACILITY:  [facility]   PHYSICIAN:  Jeanice Lim, M.D.              DATE OF BIRTH:  12/05/1965   DATE OF ADMISSION:  04/14/2003  DATE OF DISCHARGE:                         PSYCHIATRIC ADMISSION ASSESSMENT   IDENTIFYING INFORMATION:  A 45 year old single white female, voluntarily  admitted on April 14, 2003.   HISTORY OF PRESENT ILLNESS:  The patient presents with a history of  threatening family members, her parents.  The patient reports that they are  verbally abusive.  She states that she is going to assassinate them with a  knife.  She has no history of violence.  She denies any suicidal thoughts.  The patient reports she has been having trouble sleeping at home and that  her appetite has been fair.  She has been having problems with auditory  hallucinations to hurt her father, also visual hallucinations seeing blood  dripping from the walls and spiders.  The patient reports feeling some  depression at times but denied any current suicidal ideation.   PAST PSYCHIATRIC HISTORY:  First hospitalization at [facility], has a history of overdosing in 2000.  Says it was a serious  overdose.  Has been hospitalized at [facility] last year.  She reports a  history of borderline personality and schizophrenia.  She sees Dr. Denyce Robert  at [facility].   SOCIAL HISTORY:  She is a 45 year old single white female.  She lives with  her parents.  She is on disability.  No legal problems.   FAMILY HISTORY:  On her father's side she reports a history of  schizophrenia.   ALCOHOL DRUG HISTORY:  Nonsmoker, no alcoholic drug use.   PAST MEDICAL HISTORY:  Her primary care Golda Zavalza is Dr. Harrison Mons.  Also sees  Dr. Arnoldo Lenis who is a liver specialist at [facility].  Medical problems are  hepatitis C, insulin-dependent diabetes mellitus.   MEDICATIONS:  Humulin 70-30 32 units in the morning and 34 units in the  evening, Risperdal 1 mg at h.s., Celexa 40 mg 1.5 tabs daily, Neurontin 200  mg q.i.d., calcium 500 mg daily, Seroquel 300 mg at bedtime.   DRUG ALLERGIES:  HALDOL, CLOZARIL.  The patient reports that she gets too  sleepy.   PHYSICAL EXAMINATION:  Done at [place] which was reviewed.  The  patient is a short-statured, overweight female.  Temperature 98.4, 80 pulse,  20 respirations.  She is 4 feet 11 inches tall.  She is 245 pounds.  Her  right hand is mildly swollen, somewhat red.   LABORATORY DATA:  Blood sugar is 136, her urine drug screen is negative.  Her SGOT is 55, SGPT is 62, alkaline phosphatase is 144.  Alcohol level less  than 0.1.  ALT is 48.3.   MENTAL STATUS EXAM:  She is an alert, overweight, unkempt female,  cooperative, fair eye contact.  Speech is clear.  The patient states today  that she feels out of it.  Affect is somewhat flat.  Thought processes are  coherent, the patient was reporting positive auditory hallucinations,  mumbling,  possibly could be distracted at this time to some internal  stimuli, looking off into the distance at time.  Cognitive function intact  Memory is fair, judgment and insight is fair.   ADMISSION DIAGNOSES:   AXIS I:  Schizoaffective disorder.   AXIS II:  Deferred.   AXIS III:  Insulin-dependent diabetes mellitus, hypothyroidism, hepatitis C.   AXIS IV:  Problems with primary support group, other psychosocial problems,  medical problems.   AXIS V:  Current is 30, estimated this past year 35.   PLAN:  Voluntary admission for threatening behavior and psychotic symptoms.  Contract for safety, the patient will be placed in the 400 hall for close  monitoring.  Will monitor blood sugars, will check labs as needed.  Will  stabilize mood and thinking.  We will resume the patient's medications at  this  time.  The patient is to follow up with Dr. Denyce Robert and will consider  assisted living.   TENTATIVE LENGTH OF CARE:  5-6 days or more depending on the patient's  placement.     Landry Corporal, N.P.                       Jeanice Lim, M.D.    JO/MEDQ  D:  04/17/2003  T:  04/17/2003  Job:  [id number]

## 2010-06-06 NOTE — Consult Note (Signed)
[place]. [facility]  Patient:    Jessica [name], Jessica [name]                       MRN: [id number] Proc. Date: 11/17/99 Adm. Date:  [id number] Attending:  Merwyn Katos CC:         Barbaraann Share, M.D. [facility]   Consultation Report  DATE OF BIRTH: 1965-08-19  REASON FOR CONSULTATION: I was asked by the critical care team to see this 45 year old woman with significant overdose of Seroquel and Synthroid, who suffered aspiration pneumonia and developed severe respiratory failure.  HISTORY OF PRESENT ILLNESS: CT scan of the brain carried out yesterday showed symmetric bilateral low density areas in the parieto-occipital and parietal regions that were thought by the reading radiologist to represent hypoxic ischemic insult.  In actuality, these areas could represent watershed infarction but the CT scan does not show evidence of diffuse cerebral edema on the basis of loss of gray/ white matter differentiation, decreased cerebrospinal fluid spaces including the ventricles, sulci, and cisterns.  In addition to this a small area of low density in the right pons which is of uncertain significance was noted.  The remainder of the brain looks quite good at this time.  Detailed review of the referring notes fails to show evidence of a PaO2 below 60.  At this point the patient seems to be improving somewhat in oxygenation and ventilation with being placed in the prone position, on Diprivan with -.  All three of these steps were necessary in order to properly ventilate her.  Unfortunately, they have made neurologic examination virtually impossible.  PAST MEDICAL HISTORY:  1. Graves disease, treated with thyroidectomy and she is now functionally     hypothyroid.  2. Type 2 diabetes mellitus.  3. Obesity.  4. Hepatitis C.  5. Paranoid schizophrenia.  6. Borderline personality disorder.  7. Obsessive-compulsive disorder.  8. The chart suggests that she has mental  retardation but the mother     vehemently denies this and says that she has learning disabilities.  No previous episode of suicide attempt.  The patient was initially evaluated and found to have obtundation.  She was given charcoal and became slightly combative.  She became more somnolent and started snoring and grunting, was dusky, and was intubated.  At some point she may have aspirated.  She was able to self-extubate on November 14, 1999. Arterial blood gas showed a pH of 7.23 at one point on November 14, 1999, which is when I suspect this occurred.  Again, no signs of hypoxemia.  ADMISSION MEDICATIONS:  1. Humulin 70/30 24 units in the morning and 20 units in the evening.  2. Neurontin 300 mg 4 tablets q.d.  3. Celexa 40 mg 2 tablets every evening.  4. Lamictal 100 mg 1/2 tablet in the morning and 1 tablet in the evening.  5. Synthroid 0.1 mg q.d.  6. Seroquel 200 mg b.i.d.  ALLERGIES: No known drug allergies.  SOCIAL HISTORY: The patient lives with her parents.  She does not use tobacco, drink alcohol, or use drugs.  There are times when she is noncompliant with medications.  She became despondent and her mother had gone to the gas station to fill the car and came back and found two empty bottles, and immediately took the patient to the hospital.  FAMILY HISTORY: Noncontributory.  PHYSICAL EXAMINATION:  VITAL SIGNS: On examination today temperature is 99.8 degrees, blood pressure 116/66 resting,  pulse 94, respirations 26.  Pulse oximetry 100% with the patient on 100% oxygen.  There is a very significant effusion gradient with the most recent blood gas obtained around noon showing a pH of 7.31, pCO2 of 64.4, pO2 69.  She was on decreased ventilation the patient decrease stretch of lung.  CHEST: Lungs show good air movement and diffuse rhonchi.  HEART: Appears normal.  No signs of infection on the limited examination.  No significant rash.  Normal pulses.  ABDOMEN:  Morbidly obese.  I cannot palpate her abdomen.  NEUROLOGIC: Limited to pupillary reaction.  IMPRESSION: Medical obtundation with altered mental status secondary to medications, 293.0.  I cannot rule out the possibility of hypoxemic insult but CT scan does not show that.  I suspect that the effects of Seroquel and Synthroid have passed or will pass soon.  Effects of the aspiration pneumonia will require prolonged ventilatory support, which has the attendant risk of developing critical care polyneuropathy and neuromyopathy.  RECOMMENDATIONS: CT scan of the brain needs to be repeated when it is safe to do so.  EEG should be done when the patient can come off Diprivan.  Finally, MRI scan with an open magnet could be done when it is safe to actually move the patient.  I appreciate the opportunity to see the patient.  I spoke with the mother at length about our inability to prognosticate, the reason for her diffuse edema, and the relationship of Seroquel, Synthroid, and aspiration to her clinical course.  I will follow up and make comments as are appropriate. DD:  11/17/99 TD:  11/18/99 Job: [id number]

## 2010-06-06 NOTE — Discharge Summary (Signed)
NAME:  Jessica [name], Jessica [name] NO.:  [id number]   MEDICAL RECORD NO.:  [id number]                   PATIENT TYPE:  IPS   LOCATION:  0400                                 FACILITY:  [facility]   PHYSICIAN:  Jeanice Lim, M.D.              DATE OF BIRTH:  11-11-1965   DATE OF ADMISSION:  04/14/2003  DATE OF DISCHARGE:  04/20/2003                                 DISCHARGE SUMMARY   IDENTIFYING DATA:  This is a 45 year old single Caucasian female,  voluntarily admitted, history of threatening family members, talking about  wanting to assassinate parents with a knife, however has no history of  violence.  History of trouble sleeping and auditory hallucinations, at times  command nature to hurt father and reported seeing visual hallucinations of  blood dripping from the walls and spiders.  The patient sees Dr. Ames Coupe  at [facility].   ADMISSION MEDICATIONS:  Humulin, Risperdal, Celexa, Neurontin, calcium and  Seroquel.   ALLERGIES:  HALDOL, CLOZARIL.   PHYSICAL EXAMINATION:  Essentially within normal limits, neurologically  nonfocal.   ROUTINE ADMISSION LABS:  Essentially within normal limits.  Blood sugar 136,  urine drug screen negative.  SGOT and SGPT 55 and 62.  Alcohol level less  than 0.1.   MENTAL STATUS EXAM:  Alert, overweight, unkempt female, cooperative, fair  eye contact.  Speech clear, feeling out of it.  Affect somewhat flat,  thought process goal directed, reporting auditory hallucinations, mumbling,  thoughts distracted at times.  Some internal stimulus, looking off in the  distance.  Cognitive function intact.  Memory, judgment and insight fair.   ADMISSION DIAGNOSES:   AXIS I:  Schizoaffective disorder.   AXIS II:  Deferred.   AXIS III:  Insulin-dependent diabetes mellitus, hypothyroidism, hepatitis C.   AXIS IV:  Moderate, problems with primary support group and other  psychosocial issues, medical  problems.   AXIS V:  30/60.   HOSPITAL COURSE:  The patient was admitted and ordered routine p.r.n.  medications, underwent further monitoring, and was encouraged to participate  in individual, group and milieu therapy.  The patient was stabilized on  medications.  The patient was encouraged to develop healthier coping skills.  Seroquel was decreased, Neurontin optimized, then Seroquel further  optimized.  Risperdal added for acute psychotic symptoms and as medications  were adjusted the patient reported positive response, with resolution of  dangerous ideation, no longer feeling anger towards parents, regretting  having made statements prior to admission, doing quite well.  Mood was  stable and there were no overt psychotic symptoms.  The patient was given  medication education.   DISCHARGE MEDICATIONS:  1. Insulin.  2. Risperdal 0.25 mg q.a.m. and 4 tablets at night.  3. Neurontin 100 mg 2 tablets 3 times a day and Neurontin 400 mg q.h.s.  4. Seroquel 300 mg q.h.s. and 25 mg four q.a.m. and two  at bedtime.  5. Celexa 40 mg 1 tablet in the morning.  6. Levothyroxine 112 mcg q.a.m.   DISPOSITION:  The patient was to follow up on April 1 with Pennie Rushing and  April 5 at 2:30 with Serita Sheller, attend  [facility] on April  6 at 9 a.m., Dr. Cheree Ditto.   DISCHARGE DIAGNOSES:   AXIS I:  Schizoaffective disorder.   AXIS II:  Deferred.   AXIS III:  Insulin-dependent diabetes mellitus, hypothyroidism, hepatitis C.   AXIS IV:  Moderate, problems with primary support group and other  psychosocial issues, medical problems.   AXIS V:  Global assessment of function on discharge was 50-55.                                               Jeanice Lim, M.D.    JEM/MEDQ  D:  05/13/2003  T:  05/14/2003  Job:  [id number]

## 2010-06-06 NOTE — H&P (Signed)
NAME:  Jessica [name], Jessica [name] NO.:  [id number]   MEDICAL RECORD NO.:  [id number]                   PATIENT TYPE:  IPS   LOCATION:  0506                                 FACILITY:  [facility]   PHYSICIAN:  Geoffery Lyons, M.D.                   DATE OF BIRTH:  15-May-1965   DATE OF ADMISSION:  03/16/2002  DATE OF DISCHARGE:                         PSYCHIATRIC ADMISSION ASSESSMENT   DATE OF ASSESSMENT:  March 17, 2002   CHIEF COMPLAINT:  I just don't want to live anymore; its not worth it.   PATIENT IDENTIFICATION:  This is a 45 year old white female who is single,  voluntary admission.   HISTORY OF PRESENT ILLNESS:  This patient took and overdose of Seroquel and  other psychotropic medications on February 25, taking approximately five  days' worth, according to her mother's report in the emergency room.  The  patient had phone conversation immediately prior to the overdose with her  best friend that upset her.  Her best friend told her that they could not be  friends anymore, although the patient seems to be unclear on the reason for  this.  The patient states that she is upset, this is her best friend, and  she lost another friend to suicide a couple of years ago.  This is the last  friend she has and she does not want to live anymore if she cannot continue  with their friendship.  The patient is able to promise safety on the unit.  She thought that her medications were working quite well up until the time  she took the overdose.  She endorses suicidal ideation today with no  particular plan; denies any homicidal ideation.  She uses no substances was  not under the influence of any substances at the time of the overdose.   PAST PSYCHIATRIC HISTORY:  This is one of multiple admissions to [facility] with the last one here being in September 2003.  The patient has a long history of chronic undifferentiated schizophrenia and  is followed at  [facility].   SUBSTANCE ABUSE HISTORY:  The patient denies any history of substance abuse.   PAST MEDICAL HISTORY:  The patient is followed by Dr. Harrison Mons in [place], who  is her primary care Jessica [name].  Medical problems include a history of  hepatitis C, Grave's disease, hyperparathyroidism, diabetes mellitus type  II, morbid obesity, sleep apnea, and currently, she has considerable edema,  appears to have 1-2+ edema of her extremities.   MEDICATIONS:  1. Synthroid 112 mcg daily.  2. Celexa 40 mg daily.  3. Risperdal 1 mg p.o. q.h.s.  4. Calcitrol 0.5 mcg one p.o. q.a.m.  5. Calcium carbonate 500 mg p.o. t.i.d. p.r.n.  6. Seroquel 200 mg one tablet p.o. q.a.m. and 600 mg q.h.s.  7. Humulin insulin 70/30 22 units q.a.m. and 25 units  at supper.   DRUG ALLERGIES:  None.   PHYSICAL EXAMINATION:  GENERAL:  The patient's physical examination was done  in the medical ICU at [facility] by Dr. Corey Skains.  It is noted in the  record and is generally unremarkable.  She was treated there with IVs and  had EKGs done, which include normal tracings.  Her corrected QT interval at  that time was noted at 400.  She is 4 feet 11 tall and weighs 233 pounds.  Today, she appears to have peripheral edema in her hands and feet.  Her face  is rounded and appears puffy.  VITAL SIGNS:  On admission to the unit, temperature 98.1, pulse 95,  respirations 16, blood pressure 141/92.  LUNGS:  Her lungs are clear to auscultation.  HEART:  Heart sounds are generally normal and apical pulse is synchronous  with radial pulse.  MUSCULOSKELETAL:  Her gait is generally normal.  ABDOMEN:  Her abdomen is rounded and firm and nontender.   LABORATORY DATA:  Her laboratory studies revealed a generally normal CBC  with normal hemoglobin and hematocrit, platelets were mildly decreased at  92,000.  Her hemoglobin A1c is noted at 7.8.  Her last metabolic panel that  was done noted her chloride  was mildly decreased at 97, her glucose was  elevated at 124, her potassium was normal at 4.2, and sodium 138.  Her  calcium level was 7.3.  On admission last night, her CBG was 119 mg%.   SOCIAL HISTORY:  The patient is single, no children, never married.  She  lives with her parents.  She has no history of substance abuse, no legal  charges against her.  Her social life is primarily centered around [employer] in [place], where she has had varying degrees of  involvement with different youth groups there and has had considerable  support there.  That is where her best friend is located and how she knew  her and this may be as stressor for her also.   FAMILY HISTORY:  Family history is unclear; unremarkable.   MENTAL STATUS EXAM:  The patient is an obese female with a blunted affect,  poor concentration.  She is cooperative with poor eye contact and generally  has detached affect and appears to have an apathetic manner.  Speech is  normal.  Mood is depressed, frustrated.  She is grieving and rather  distressed about the loss of her friend.  When asked about her thoughts  today, she states, I know I can be safe here but not at home.  I just feel  so tired.  I just can't stand to live without my friend.  Thought process  is generally logical and coherent, although her thinking processes are  extremely concrete.  She sees no way or opportunity to find herself any  other friends or any future without the friend that had rejected her.  She  seems to be very unclear about why friend has rejected her and is unable to  think toward the future that there could be something going with this friend  or that anything will change.  She is positive for positive but has no clear  plan; no homicidal ideation.  Cognitive: She is intact to person and place.  She is a little bit confused about the day.   ADMISSION DIAGNOSES:   AXIS I: 1. Adjustment reaction, not otherwise  specified.  2. Schizophrenia, chronic, undifferentiated.   AXIS II:  Deferred.  AXIS III:  1. Hepatitis C.  2. Edema, not otherwise specified.  3. Diabetes mellitus type 2.  4. Grave's disease.  5. Hyperparathyroidism.   AXIS IV:  Grief over the loss of a friendship.  Having supportive parents  and home environment and a supportive social group at church is an asset to  her.   AXIS V:  Current 28, past year 37.   INITIAL PLAN OF CARE:  Plan is to voluntarily admit the patient with q.17m.  checks in place to alleviate her suicidal ideation.  We are going to go  ahead and continue her previous medicines at this point and will ask the  case manager to contact her parents for additional history about their  concerns.  We are going to ask internal medicine also to evaluate her edema  and her medical conditions.  She does have several complex medical  conditions.  We have discussed a plan of care with her and she is agreeable  to it.  Because she has had some agitation today and some worry over her  condition, we are going to give her one time now dose of Seroquel 100 mg  p.o. daily and will make further adjustments as need to.   ESTIMATED LENGTH OF STAY:  At this point, estimated length of stay is five  days.     Margaret A. Scott, N.P.                   Geoffery Lyons, M.D.    MAS/MEDQ  D:  03/20/2002  T:  03/20/2002  Job:  [id number]
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
## 2010-06-06 NOTE — Discharge Summary (Signed)
[facility]  Patient:    Jessica [name], Jessica [name]                       MRN: [id number] Adm. Date:  [id number] Disc. Date: 12/17/99 Attending:  Avie Echevaria Dictator:   Earley Favor, RN,MSN,ACNP CC:         Adelene Amas. Williford, M.D.  Dr. Reggy Eye.  Dr. Deanna Artis. Hickling.  Dr. Kristopher Oppenheim.   Discharge Summary  DATE OF BIRTH:  [birth year]  DISCHARGE DIAGNOSES: 1. Acute respiratory failure with fibroproliferative adult respiratory    distress syndrome requiring mechanical ventilatory support. 2. Adult onset diabetes mellitus, type 2, with exacerbation secondary to    steroids. 3. Paranoid schizophrenia. 4. Hypothyroidism.  HISTORY OF PRESENT ILLNESS:  Jessica [name] is a 45 year old white female admitted to [facility] November 12, 1999, status post drug overdose on Seroquel and Zaroxolyn.  She has had progressive obtundation and necessitated intubation, and attempts at weaning have failed secondary to agitation.  She was transferred to [facility] with progressive hypoxemia.  Due to her deteriorated pulmonary status, she was transferred to [facility] to the care of the pulmonary critical care services.  PAST MEDICAL HISTORY: 1. Graves disease. 2. Obesity. 3. Obsessive-Compulsive disorder. 4. Mental retardation. 5. Schizophrenia. 6. Borderline personality. 7. Multiple suicide attempts.  LABORATORY DATA:  Hemoglobin was 11.1, hematocrit 34.4, platelets 127, WBC 4.7, sodium 141, potassium 3.4, chloride 100, CO2 33, BUN 7, creatinine 0.5, glucose 177.  Chest x-ray currently shows bilateral diffuse air space disease that is improved from previous x-rays.  ABGs on 40% FIO2 with a pressure regulated ______ control ______ normal 500 with a rate of 18 on ______ . pH 7.3, pCO2 49, PO2 106. TSH on 12/13/99 was 14.081.  Chest x-ray also shows stable cardiomegaly.  EKG shows sinus tachycardia with nonspecific ST changes, no acute  abnormalities.  2-D echocardiogram demonstrated left ventricular grossly normal, ejection fraction 55-65%, without further abnormalities.  This was read by Dr. Charlton Haws.  PROCEDURES: 1. Endotracheal intubation 1/28 to 11/27/1999. 2. A #6 Shiley tracheotomy from 11/8 to 12/08/1999.  November 19, a #4 Shiley    tracheotomy. until 11/20. 3. Central venous access from 10/28 to 11/07. 4. Right radial arterial line 11/5 to 11/08.  HOSPITAL COURSE BY DISCHARGE DIAGNOSES: #1 - ACUTE RESPIRATORY FAILURE WITH FIBROPROLIFERATIVE ADULT RESPIRATORY DISTRESS SYNDROME:  Requiring mechanical ventilatory support with oral tracheal intubation, subsequently underwent tracheostomy with prolonged mechanical ventilatory support.  Jessica [name] was admitted as a transfer from [facility] with overdose requiring mechanical ventilatory support and decrease in level of consciousness.  She required tracheostomy for prolonged ventilation.  She was subsequently liberated from the ventilator on November 29, 1999 and subsequently had her tracheostomy removed by December 09, 1999.  Currently she remains O2 dependent requiring O2 of 1-2 liters 24 hours a day to keep her O2 saturations reading at 92%.  Of note, her tracheostomy site is 90% closed.  She has reached maximal hospital benefit and despite requiring oxygen for residual fibroproliferative ARDS changes, she is ready to be transferred to [facility].  #2 - ADULT ONSET DIABETES MELLITUS, TYPE 2, EXACERBATION SECONDARY TO ILLNESS AND STEROIDS FOR FIBROPROLIFERATIVE ARDS:  Currently, her capillary glucose is being controlled at 170.  She will be continued on her current treatment as specified in her MAR.  #3 - PARANOID SCHIZOPHRENIC WITH OBSESSIVE-COMPULSIVE RITUALISTIC PERSONALITY WITH MENTAL RETARDATION:  While in hospital, she  has been cared for by Dr. Jeanie Sewer of psychiatry.  Please see the medication list for pharmaceutical interventions  to control her mental state.  #4 - HYPOTHYROIDISM:  Her TSH was noted to be elevated at 14.  She was restarted on her Synthroid, and her TSH will be checked in the future.  #5 - DEBILITATION:  She is debilitated secondary to prolonged hospitalization but has improved with speech therapy, occupational therapy and physical therapy.  DISCHARGE MEDICATIONS:  1. Remeron 45 mg q.h.s.  2. Klonopin 1.5 mg t.i.d.  Note, this is being held for sedation.  3. Albuterol or Ventolin nebulizer 2.5 q.i.d.  4. Protonix 40 mg b.i.d.  5. Glucotrol 5 mg q.a.c.  6. Synthroid 100 mcg q.d.  7. Colace 100 mg b.i.d.  8. Sodium chloride nasal spray 2 puffs b.i.d.  9. Neurontin 300 mg q.i.d. 10. Lamictal 25 mg 1 q.d. 11. Lamictal 100 mg q.h.s. 12. Seroquel 100 mg q.d. 13. Celexa 60 mg 1 q.d. 14. Seroquel 300 mg q.h.s. 15. Albuterol p.r.n. 2.5 mg q.3h. p.r.n. 16. Artificial tears q.2h. p.r.n. to both eyes. 17. Laxative of choice. 18. Tylenol 325 mg q.4h. p.r.n. 19. Sliding scale insulin on an a.c. and h.s. scale.  From 200-299, 6 units     regular insulin; 300-399, 12 units; greater than 400, 14 units.  FOLLOW-UP:  With [facility] under the care of Dr. ______  DIET:  1800 calorie ADA diet.  SPECIAL INSTRUCTIONS:  Per [facility].  DISPOSITION/CONDITION ON DISCHARGE:  Acute respiratory failure with acute respiratory distress syndrome with fibroproliferative ARDS is currently stable.  She remains O2 dependent.  Her main problem at this time are her psychiatric problems which will be addressed at [facility]. DD:  12/17/99 TD:  12/17/99 Job: [id number]

## 2010-06-06 NOTE — Consult Note (Signed)
[place]  Patient:    Jessica [name], Jessica [name]                       MRN: [id number] Proc. Date: 11/26/99 Adm. Date:  [id number] Attending:  Merwyn Katos CC:         Barbaraann Share, M.D. [facility]                          Consultation Report  REASON FOR CONSULTATION:  I was asked to see this unfortunate 45 year old white female with multiple medical problems who basically was admitted to [facility] with change and alteration of level of consciousness due to drug overdose.  She was intubated and then transferred soon thereafter to the surgical intensive care unit over at [facility].  She is being followed by the critical care team, including Dr. Shelle Iron.  She has developed ARDS, and has been ventilatory dependent despite very aggressive attempt of weaning by the critical care team.  She continues to be ventilatory dependent, and we are consulted regarding tracheotomy tube placement.  It was of note that she has had problems with Graves disease, and that she has actually had a subtotal thyroidectomy at a prior setting.  PHYSICAL EXAMINATION:  GENERAL:  The patient is a 45 year old sedated white female intubated in the ICU.  HEENT:  Oral cavity shows the orotracheal tube in place, but no evidence of any lesions or masses.  NECK:  Shows a well-healed thyroidectomy incision, horizontal ______ with discrete masses or other abnormalities.  The rest of her head and neck examination is unremarkable.  ASSESSMENT:  A 45 year old white female with a history of ARDS, ventilatory dependent, prior history of thyroidectomy due to Graves disease, intubated since 11/12/99, secondary to a drug overdose and ARDS.  RECOMMENDATIONS:  At this point we will proceed with elective tracheotomy tube placement in the GOR tomorrow, 11/27/99, under anesthesia.  I have discussed with the mother the risks and benefits from surgery, including risk of  general anesthesia, infection, bleeding, injury to trachea and larynx, and normal recovery period after surgery.  I have entertained any questions, and answered them appropriately.  Informed consent has been obtained.  The patient will be sent for surgery as noted tomorrow morning.  Thank you for allowing me to take part in the care of Ms. Selbe.  If you have any further questions or concerns please feel free to contact me anytime. DD:  11/26/99 TD:  11/27/99 Job: [id number]
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
## 2012-08-16 DIAGNOSIS — F259 Schizoaffective disorder, unspecified: Secondary | ICD-10-CM | POA: Diagnosis not present

## 2013-01-20 DIAGNOSIS — F259 Schizoaffective disorder, unspecified: Secondary | ICD-10-CM | POA: Diagnosis not present

## 2013-01-31 DIAGNOSIS — F259 Schizoaffective disorder, unspecified: Secondary | ICD-10-CM | POA: Diagnosis not present

## 2013-02-03 DIAGNOSIS — F259 Schizoaffective disorder, unspecified: Secondary | ICD-10-CM | POA: Diagnosis not present

## 2013-02-08 DIAGNOSIS — Z79899 Other long term (current) drug therapy: Secondary | ICD-10-CM | POA: Diagnosis not present

## 2013-02-21 DIAGNOSIS — F259 Schizoaffective disorder, unspecified: Secondary | ICD-10-CM | POA: Diagnosis not present

## 2013-02-22 DIAGNOSIS — Z79899 Other long term (current) drug therapy: Secondary | ICD-10-CM | POA: Diagnosis not present

## 2013-02-27 DIAGNOSIS — Z6836 Body mass index (BMI) 36.0-36.9, adult: Secondary | ICD-10-CM | POA: Diagnosis not present

## 2013-02-27 DIAGNOSIS — R059 Cough, unspecified: Secondary | ICD-10-CM | POA: Diagnosis not present

## 2013-02-27 DIAGNOSIS — J069 Acute upper respiratory infection, unspecified: Secondary | ICD-10-CM | POA: Diagnosis not present

## 2013-02-27 DIAGNOSIS — J309 Allergic rhinitis, unspecified: Secondary | ICD-10-CM | POA: Diagnosis not present

## 2013-02-27 DIAGNOSIS — R05 Cough: Secondary | ICD-10-CM | POA: Diagnosis not present

## 2013-03-09 DIAGNOSIS — J45901 Unspecified asthma with (acute) exacerbation: Secondary | ICD-10-CM | POA: Diagnosis not present

## 2013-03-09 DIAGNOSIS — J069 Acute upper respiratory infection, unspecified: Secondary | ICD-10-CM | POA: Diagnosis not present

## 2013-03-09 DIAGNOSIS — Z79899 Other long term (current) drug therapy: Secondary | ICD-10-CM | POA: Diagnosis not present

## 2013-03-21 DIAGNOSIS — F259 Schizoaffective disorder, unspecified: Secondary | ICD-10-CM | POA: Diagnosis not present

## 2013-03-22 DIAGNOSIS — Z3009 Encounter for other general counseling and advice on contraception: Secondary | ICD-10-CM | POA: Diagnosis not present

## 2013-03-27 DIAGNOSIS — M25569 Pain in unspecified knee: Secondary | ICD-10-CM | POA: Diagnosis not present

## 2013-03-28 DIAGNOSIS — M25569 Pain in unspecified knee: Secondary | ICD-10-CM | POA: Diagnosis not present

## 2013-04-06 DIAGNOSIS — R45851 Suicidal ideations: Secondary | ICD-10-CM | POA: Diagnosis not present

## 2013-04-06 DIAGNOSIS — F3289 Other specified depressive episodes: Secondary | ICD-10-CM | POA: Diagnosis not present

## 2013-04-06 DIAGNOSIS — F329 Major depressive disorder, single episode, unspecified: Secondary | ICD-10-CM | POA: Diagnosis not present

## 2013-04-06 DIAGNOSIS — F315 Bipolar disorder, current episode depressed, severe, with psychotic features: Secondary | ICD-10-CM | POA: Diagnosis not present

## 2013-04-06 DIAGNOSIS — R45850 Homicidal ideations: Secondary | ICD-10-CM | POA: Diagnosis not present

## 2013-04-06 DIAGNOSIS — F29 Unspecified psychosis not due to a substance or known physiological condition: Secondary | ICD-10-CM | POA: Diagnosis not present

## 2013-04-07 DIAGNOSIS — F315 Bipolar disorder, current episode depressed, severe, with psychotic features: Secondary | ICD-10-CM | POA: Diagnosis not present

## 2013-04-08 DIAGNOSIS — E039 Hypothyroidism, unspecified: Secondary | ICD-10-CM | POA: Diagnosis present

## 2013-04-08 DIAGNOSIS — F200 Paranoid schizophrenia: Secondary | ICD-10-CM | POA: Diagnosis present

## 2013-04-08 DIAGNOSIS — R45851 Suicidal ideations: Secondary | ICD-10-CM | POA: Diagnosis not present

## 2013-04-08 DIAGNOSIS — F911 Conduct disorder, childhood-onset type: Secondary | ICD-10-CM | POA: Diagnosis not present

## 2013-04-08 DIAGNOSIS — I10 Essential (primary) hypertension: Secondary | ICD-10-CM | POA: Diagnosis present

## 2013-04-08 DIAGNOSIS — F3189 Other bipolar disorder: Secondary | ICD-10-CM | POA: Diagnosis not present

## 2013-04-08 DIAGNOSIS — E119 Type 2 diabetes mellitus without complications: Secondary | ICD-10-CM | POA: Diagnosis present

## 2013-04-08 DIAGNOSIS — F3289 Other specified depressive episodes: Secondary | ICD-10-CM | POA: Diagnosis not present

## 2013-04-08 DIAGNOSIS — F319 Bipolar disorder, unspecified: Secondary | ICD-10-CM | POA: Diagnosis present

## 2013-04-08 DIAGNOSIS — B1920 Unspecified viral hepatitis C without hepatic coma: Secondary | ICD-10-CM | POA: Diagnosis present

## 2013-04-08 DIAGNOSIS — F068 Other specified mental disorders due to known physiological condition: Secondary | ICD-10-CM | POA: Diagnosis not present

## 2013-04-08 DIAGNOSIS — F259 Schizoaffective disorder, unspecified: Secondary | ICD-10-CM | POA: Diagnosis not present

## 2013-04-08 DIAGNOSIS — F603 Borderline personality disorder: Secondary | ICD-10-CM | POA: Diagnosis not present

## 2013-04-08 DIAGNOSIS — F329 Major depressive disorder, single episode, unspecified: Secondary | ICD-10-CM | POA: Diagnosis not present

## 2013-04-08 DIAGNOSIS — R45850 Homicidal ideations: Secondary | ICD-10-CM | POA: Diagnosis not present

## 2013-04-12 DIAGNOSIS — M25569 Pain in unspecified knee: Secondary | ICD-10-CM | POA: Diagnosis not present

## 2013-04-12 DIAGNOSIS — Z79899 Other long term (current) drug therapy: Secondary | ICD-10-CM | POA: Diagnosis not present

## 2013-04-12 DIAGNOSIS — F259 Schizoaffective disorder, unspecified: Secondary | ICD-10-CM | POA: Diagnosis not present

## 2013-04-18 DIAGNOSIS — F259 Schizoaffective disorder, unspecified: Secondary | ICD-10-CM | POA: Diagnosis not present

## 2013-04-18 DIAGNOSIS — M25569 Pain in unspecified knee: Secondary | ICD-10-CM | POA: Diagnosis not present

## 2013-05-02 DIAGNOSIS — M1710 Unilateral primary osteoarthritis, unspecified knee: Secondary | ICD-10-CM | POA: Diagnosis not present

## 2013-05-02 DIAGNOSIS — Z79899 Other long term (current) drug therapy: Secondary | ICD-10-CM | POA: Diagnosis not present

## 2013-05-02 DIAGNOSIS — IMO0002 Reserved for concepts with insufficient information to code with codable children: Secondary | ICD-10-CM | POA: Diagnosis not present

## 2013-05-03 DIAGNOSIS — M25569 Pain in unspecified knee: Secondary | ICD-10-CM | POA: Diagnosis not present

## 2013-05-08 DIAGNOSIS — Z794 Long term (current) use of insulin: Secondary | ICD-10-CM | POA: Diagnosis not present

## 2013-05-08 DIAGNOSIS — J189 Pneumonia, unspecified organism: Secondary | ICD-10-CM | POA: Diagnosis not present

## 2013-05-08 DIAGNOSIS — Z79899 Other long term (current) drug therapy: Secondary | ICD-10-CM | POA: Diagnosis not present

## 2013-05-08 DIAGNOSIS — J180 Bronchopneumonia, unspecified organism: Secondary | ICD-10-CM | POA: Diagnosis not present

## 2013-05-08 DIAGNOSIS — I10 Essential (primary) hypertension: Secondary | ICD-10-CM | POA: Diagnosis not present

## 2013-05-08 DIAGNOSIS — E039 Hypothyroidism, unspecified: Secondary | ICD-10-CM | POA: Diagnosis not present

## 2013-05-08 DIAGNOSIS — E119 Type 2 diabetes mellitus without complications: Secondary | ICD-10-CM | POA: Diagnosis not present

## 2013-05-09 DIAGNOSIS — Z79899 Other long term (current) drug therapy: Secondary | ICD-10-CM | POA: Diagnosis not present

## 2013-05-09 DIAGNOSIS — J309 Allergic rhinitis, unspecified: Secondary | ICD-10-CM | POA: Diagnosis not present

## 2013-05-09 DIAGNOSIS — J189 Pneumonia, unspecified organism: Secondary | ICD-10-CM | POA: Diagnosis not present

## 2013-05-13 DIAGNOSIS — F489 Nonpsychotic mental disorder, unspecified: Secondary | ICD-10-CM | POA: Diagnosis not present

## 2013-05-13 DIAGNOSIS — F209 Schizophrenia, unspecified: Secondary | ICD-10-CM | POA: Diagnosis not present

## 2013-05-13 DIAGNOSIS — F3289 Other specified depressive episodes: Secondary | ICD-10-CM | POA: Diagnosis not present

## 2013-05-13 DIAGNOSIS — F312 Bipolar disorder, current episode manic severe with psychotic features: Secondary | ICD-10-CM | POA: Diagnosis not present

## 2013-05-13 DIAGNOSIS — F29 Unspecified psychosis not due to a substance or known physiological condition: Secondary | ICD-10-CM | POA: Diagnosis not present

## 2013-05-13 DIAGNOSIS — F329 Major depressive disorder, single episode, unspecified: Secondary | ICD-10-CM | POA: Diagnosis not present

## 2013-05-13 DIAGNOSIS — E119 Type 2 diabetes mellitus without complications: Secondary | ICD-10-CM | POA: Diagnosis not present

## 2013-05-13 DIAGNOSIS — I10 Essential (primary) hypertension: Secondary | ICD-10-CM | POA: Diagnosis not present

## 2013-05-13 DIAGNOSIS — R443 Hallucinations, unspecified: Secondary | ICD-10-CM | POA: Diagnosis not present

## 2013-05-13 DIAGNOSIS — R45850 Homicidal ideations: Secondary | ICD-10-CM | POA: Diagnosis not present

## 2013-05-13 DIAGNOSIS — E039 Hypothyroidism, unspecified: Secondary | ICD-10-CM | POA: Diagnosis not present

## 2013-05-14 DIAGNOSIS — R45850 Homicidal ideations: Secondary | ICD-10-CM | POA: Diagnosis not present

## 2013-05-14 DIAGNOSIS — F312 Bipolar disorder, current episode manic severe with psychotic features: Secondary | ICD-10-CM | POA: Diagnosis not present

## 2013-05-14 DIAGNOSIS — F329 Major depressive disorder, single episode, unspecified: Secondary | ICD-10-CM | POA: Diagnosis not present

## 2013-05-14 DIAGNOSIS — F3289 Other specified depressive episodes: Secondary | ICD-10-CM | POA: Diagnosis not present

## 2013-05-14 DIAGNOSIS — F209 Schizophrenia, unspecified: Secondary | ICD-10-CM | POA: Diagnosis not present

## 2013-05-15 DIAGNOSIS — F259 Schizoaffective disorder, unspecified: Secondary | ICD-10-CM | POA: Diagnosis not present

## 2013-05-15 DIAGNOSIS — F79 Unspecified intellectual disabilities: Secondary | ICD-10-CM | POA: Diagnosis not present

## 2013-05-15 DIAGNOSIS — I10 Essential (primary) hypertension: Secondary | ICD-10-CM | POA: Diagnosis present

## 2013-05-15 DIAGNOSIS — B1920 Unspecified viral hepatitis C without hepatic coma: Secondary | ICD-10-CM | POA: Diagnosis present

## 2013-05-15 DIAGNOSIS — E669 Obesity, unspecified: Secondary | ICD-10-CM | POA: Diagnosis present

## 2013-05-15 DIAGNOSIS — F312 Bipolar disorder, current episode manic severe with psychotic features: Secondary | ICD-10-CM | POA: Diagnosis not present

## 2013-05-15 DIAGNOSIS — F29 Unspecified psychosis not due to a substance or known physiological condition: Secondary | ICD-10-CM | POA: Diagnosis not present

## 2013-05-15 DIAGNOSIS — E119 Type 2 diabetes mellitus without complications: Secondary | ICD-10-CM | POA: Diagnosis present

## 2013-05-15 DIAGNOSIS — E039 Hypothyroidism, unspecified: Secondary | ICD-10-CM | POA: Diagnosis present

## 2013-05-15 DIAGNOSIS — F3289 Other specified depressive episodes: Secondary | ICD-10-CM | POA: Diagnosis not present

## 2013-05-15 DIAGNOSIS — R45850 Homicidal ideations: Secondary | ICD-10-CM | POA: Diagnosis not present

## 2013-05-15 DIAGNOSIS — F39 Unspecified mood [affective] disorder: Secondary | ICD-10-CM | POA: Diagnosis not present

## 2013-05-15 DIAGNOSIS — F3130 Bipolar disorder, current episode depressed, mild or moderate severity, unspecified: Secondary | ICD-10-CM | POA: Diagnosis present

## 2013-05-15 DIAGNOSIS — F209 Schizophrenia, unspecified: Secondary | ICD-10-CM | POA: Diagnosis not present

## 2013-05-15 DIAGNOSIS — F329 Major depressive disorder, single episode, unspecified: Secondary | ICD-10-CM | POA: Diagnosis not present

## 2013-06-05 DIAGNOSIS — A6000 Herpesviral infection of urogenital system, unspecified: Secondary | ICD-10-CM | POA: Diagnosis not present

## 2013-06-05 DIAGNOSIS — G4733 Obstructive sleep apnea (adult) (pediatric): Secondary | ICD-10-CM | POA: Diagnosis not present

## 2013-06-05 DIAGNOSIS — F70 Mild intellectual disabilities: Secondary | ICD-10-CM | POA: Diagnosis not present

## 2013-06-05 DIAGNOSIS — D696 Thrombocytopenia, unspecified: Secondary | ICD-10-CM | POA: Diagnosis not present

## 2013-06-05 DIAGNOSIS — E782 Mixed hyperlipidemia: Secondary | ICD-10-CM | POA: Diagnosis not present

## 2013-06-05 DIAGNOSIS — E039 Hypothyroidism, unspecified: Secondary | ICD-10-CM | POA: Diagnosis not present

## 2013-06-05 DIAGNOSIS — I10 Essential (primary) hypertension: Secondary | ICD-10-CM | POA: Diagnosis not present

## 2013-06-05 DIAGNOSIS — E119 Type 2 diabetes mellitus without complications: Secondary | ICD-10-CM | POA: Diagnosis not present

## 2013-06-19 DIAGNOSIS — J3089 Other allergic rhinitis: Secondary | ICD-10-CM | POA: Diagnosis not present

## 2013-06-20 DIAGNOSIS — F259 Schizoaffective disorder, unspecified: Secondary | ICD-10-CM | POA: Diagnosis not present

## 2013-06-22 DIAGNOSIS — Z79899 Other long term (current) drug therapy: Secondary | ICD-10-CM | POA: Diagnosis not present

## 2013-06-23 DIAGNOSIS — I10 Essential (primary) hypertension: Secondary | ICD-10-CM | POA: Diagnosis not present

## 2013-06-23 DIAGNOSIS — F17200 Nicotine dependence, unspecified, uncomplicated: Secondary | ICD-10-CM | POA: Diagnosis not present

## 2013-06-23 DIAGNOSIS — F79 Unspecified intellectual disabilities: Secondary | ICD-10-CM | POA: Diagnosis not present

## 2013-06-23 DIAGNOSIS — G4733 Obstructive sleep apnea (adult) (pediatric): Secondary | ICD-10-CM | POA: Diagnosis not present

## 2013-06-23 DIAGNOSIS — E119 Type 2 diabetes mellitus without complications: Secondary | ICD-10-CM | POA: Diagnosis not present

## 2013-06-23 DIAGNOSIS — Z888 Allergy status to other drugs, medicaments and biological substances status: Secondary | ICD-10-CM | POA: Diagnosis not present

## 2013-06-23 DIAGNOSIS — Z79899 Other long term (current) drug therapy: Secondary | ICD-10-CM | POA: Diagnosis not present

## 2013-06-23 DIAGNOSIS — F919 Conduct disorder, unspecified: Secondary | ICD-10-CM | POA: Diagnosis not present

## 2013-06-23 DIAGNOSIS — F603 Borderline personality disorder: Secondary | ICD-10-CM | POA: Diagnosis not present

## 2013-06-24 DIAGNOSIS — F603 Borderline personality disorder: Secondary | ICD-10-CM | POA: Diagnosis not present

## 2013-06-25 DIAGNOSIS — F603 Borderline personality disorder: Secondary | ICD-10-CM | POA: Diagnosis not present

## 2013-06-27 DIAGNOSIS — E878 Other disorders of electrolyte and fluid balance, not elsewhere classified: Secondary | ICD-10-CM | POA: Diagnosis not present

## 2013-06-27 DIAGNOSIS — E119 Type 2 diabetes mellitus without complications: Secondary | ICD-10-CM | POA: Diagnosis not present

## 2013-06-27 DIAGNOSIS — E782 Mixed hyperlipidemia: Secondary | ICD-10-CM | POA: Diagnosis not present

## 2013-06-27 DIAGNOSIS — E039 Hypothyroidism, unspecified: Secondary | ICD-10-CM | POA: Diagnosis not present

## 2013-06-29 DIAGNOSIS — F259 Schizoaffective disorder, unspecified: Secondary | ICD-10-CM | POA: Diagnosis not present

## 2013-06-30 DIAGNOSIS — F70 Mild intellectual disabilities: Secondary | ICD-10-CM | POA: Diagnosis not present

## 2013-06-30 DIAGNOSIS — F259 Schizoaffective disorder, unspecified: Secondary | ICD-10-CM | POA: Diagnosis not present

## 2013-07-03 DIAGNOSIS — I10 Essential (primary) hypertension: Secondary | ICD-10-CM | POA: Diagnosis not present

## 2013-07-03 DIAGNOSIS — A6000 Herpesviral infection of urogenital system, unspecified: Secondary | ICD-10-CM | POA: Diagnosis not present

## 2013-07-03 DIAGNOSIS — E782 Mixed hyperlipidemia: Secondary | ICD-10-CM | POA: Diagnosis not present

## 2013-07-03 DIAGNOSIS — E669 Obesity, unspecified: Secondary | ICD-10-CM | POA: Diagnosis not present

## 2013-07-03 DIAGNOSIS — D696 Thrombocytopenia, unspecified: Secondary | ICD-10-CM | POA: Diagnosis not present

## 2013-07-03 DIAGNOSIS — E119 Type 2 diabetes mellitus without complications: Secondary | ICD-10-CM | POA: Diagnosis not present

## 2013-07-03 DIAGNOSIS — G4733 Obstructive sleep apnea (adult) (pediatric): Secondary | ICD-10-CM | POA: Diagnosis not present

## 2013-07-03 DIAGNOSIS — E039 Hypothyroidism, unspecified: Secondary | ICD-10-CM | POA: Diagnosis not present

## 2013-07-07 DIAGNOSIS — F259 Schizoaffective disorder, unspecified: Secondary | ICD-10-CM | POA: Diagnosis not present

## 2013-07-07 DIAGNOSIS — F70 Mild intellectual disabilities: Secondary | ICD-10-CM | POA: Diagnosis not present

## 2013-07-11 DIAGNOSIS — R45851 Suicidal ideations: Secondary | ICD-10-CM | POA: Diagnosis not present

## 2013-07-11 DIAGNOSIS — F79 Unspecified intellectual disabilities: Secondary | ICD-10-CM | POA: Diagnosis present

## 2013-07-11 DIAGNOSIS — Z794 Long term (current) use of insulin: Secondary | ICD-10-CM | POA: Diagnosis not present

## 2013-07-11 DIAGNOSIS — F603 Borderline personality disorder: Secondary | ICD-10-CM | POA: Diagnosis not present

## 2013-07-11 DIAGNOSIS — F259 Schizoaffective disorder, unspecified: Secondary | ICD-10-CM | POA: Diagnosis not present

## 2013-07-11 DIAGNOSIS — F332 Major depressive disorder, recurrent severe without psychotic features: Secondary | ICD-10-CM | POA: Diagnosis not present

## 2013-07-11 DIAGNOSIS — E079 Disorder of thyroid, unspecified: Secondary | ICD-10-CM | POA: Diagnosis not present

## 2013-07-11 DIAGNOSIS — E119 Type 2 diabetes mellitus without complications: Secondary | ICD-10-CM | POA: Diagnosis present

## 2013-07-11 DIAGNOSIS — B1920 Unspecified viral hepatitis C without hepatic coma: Secondary | ICD-10-CM | POA: Diagnosis present

## 2013-07-11 DIAGNOSIS — I10 Essential (primary) hypertension: Secondary | ICD-10-CM | POA: Diagnosis present

## 2013-07-11 DIAGNOSIS — Z888 Allergy status to other drugs, medicaments and biological substances status: Secondary | ICD-10-CM | POA: Diagnosis not present

## 2013-07-11 DIAGNOSIS — F209 Schizophrenia, unspecified: Secondary | ICD-10-CM | POA: Diagnosis not present

## 2013-07-11 DIAGNOSIS — G4733 Obstructive sleep apnea (adult) (pediatric): Secondary | ICD-10-CM | POA: Diagnosis present

## 2013-07-26 DIAGNOSIS — F259 Schizoaffective disorder, unspecified: Secondary | ICD-10-CM | POA: Diagnosis not present

## 2013-07-26 DIAGNOSIS — F70 Mild intellectual disabilities: Secondary | ICD-10-CM | POA: Diagnosis not present

## 2013-08-01 DIAGNOSIS — E039 Hypothyroidism, unspecified: Secondary | ICD-10-CM | POA: Diagnosis not present

## 2013-08-01 DIAGNOSIS — E782 Mixed hyperlipidemia: Secondary | ICD-10-CM | POA: Diagnosis not present

## 2013-08-01 DIAGNOSIS — E878 Other disorders of electrolyte and fluid balance, not elsewhere classified: Secondary | ICD-10-CM | POA: Diagnosis not present

## 2013-08-01 DIAGNOSIS — D510 Vitamin B12 deficiency anemia due to intrinsic factor deficiency: Secondary | ICD-10-CM | POA: Diagnosis not present

## 2013-08-04 DIAGNOSIS — A6000 Herpesviral infection of urogenital system, unspecified: Secondary | ICD-10-CM | POA: Diagnosis not present

## 2013-08-04 DIAGNOSIS — E782 Mixed hyperlipidemia: Secondary | ICD-10-CM | POA: Diagnosis not present

## 2013-08-04 DIAGNOSIS — D696 Thrombocytopenia, unspecified: Secondary | ICD-10-CM | POA: Diagnosis not present

## 2013-08-04 DIAGNOSIS — G4733 Obstructive sleep apnea (adult) (pediatric): Secondary | ICD-10-CM | POA: Diagnosis not present

## 2013-08-04 DIAGNOSIS — F259 Schizoaffective disorder, unspecified: Secondary | ICD-10-CM | POA: Diagnosis not present

## 2013-08-04 DIAGNOSIS — E669 Obesity, unspecified: Secondary | ICD-10-CM | POA: Diagnosis not present

## 2013-08-04 DIAGNOSIS — E119 Type 2 diabetes mellitus without complications: Secondary | ICD-10-CM | POA: Diagnosis not present

## 2013-08-04 DIAGNOSIS — E039 Hypothyroidism, unspecified: Secondary | ICD-10-CM | POA: Diagnosis not present

## 2013-08-04 DIAGNOSIS — I10 Essential (primary) hypertension: Secondary | ICD-10-CM | POA: Diagnosis not present

## 2013-08-07 DIAGNOSIS — E669 Obesity, unspecified: Secondary | ICD-10-CM | POA: Diagnosis not present

## 2013-08-07 DIAGNOSIS — E119 Type 2 diabetes mellitus without complications: Secondary | ICD-10-CM | POA: Diagnosis not present

## 2013-08-07 DIAGNOSIS — A6000 Herpesviral infection of urogenital system, unspecified: Secondary | ICD-10-CM | POA: Diagnosis not present

## 2013-08-07 DIAGNOSIS — G4733 Obstructive sleep apnea (adult) (pediatric): Secondary | ICD-10-CM | POA: Diagnosis not present

## 2013-08-07 DIAGNOSIS — E039 Hypothyroidism, unspecified: Secondary | ICD-10-CM | POA: Diagnosis not present

## 2013-08-07 DIAGNOSIS — I10 Essential (primary) hypertension: Secondary | ICD-10-CM | POA: Diagnosis not present

## 2013-08-07 DIAGNOSIS — E782 Mixed hyperlipidemia: Secondary | ICD-10-CM | POA: Diagnosis not present

## 2013-08-07 DIAGNOSIS — D696 Thrombocytopenia, unspecified: Secondary | ICD-10-CM | POA: Diagnosis not present

## 2013-08-08 DIAGNOSIS — F259 Schizoaffective disorder, unspecified: Secondary | ICD-10-CM | POA: Diagnosis not present

## 2013-08-15 DIAGNOSIS — F259 Schizoaffective disorder, unspecified: Secondary | ICD-10-CM | POA: Diagnosis not present

## 2013-08-22 DIAGNOSIS — F259 Schizoaffective disorder, unspecified: Secondary | ICD-10-CM | POA: Diagnosis not present

## 2013-09-05 DIAGNOSIS — F259 Schizoaffective disorder, unspecified: Secondary | ICD-10-CM | POA: Diagnosis not present

## 2013-09-08 DIAGNOSIS — M25529 Pain in unspecified elbow: Secondary | ICD-10-CM | POA: Diagnosis not present

## 2013-09-11 DIAGNOSIS — K219 Gastro-esophageal reflux disease without esophagitis: Secondary | ICD-10-CM | POA: Diagnosis not present

## 2013-09-11 DIAGNOSIS — I10 Essential (primary) hypertension: Secondary | ICD-10-CM | POA: Diagnosis not present

## 2013-09-11 DIAGNOSIS — G4733 Obstructive sleep apnea (adult) (pediatric): Secondary | ICD-10-CM | POA: Diagnosis not present

## 2013-09-11 DIAGNOSIS — E119 Type 2 diabetes mellitus without complications: Secondary | ICD-10-CM | POA: Diagnosis not present

## 2013-09-11 DIAGNOSIS — D696 Thrombocytopenia, unspecified: Secondary | ICD-10-CM | POA: Diagnosis not present

## 2013-09-11 DIAGNOSIS — S42309A Unspecified fracture of shaft of humerus, unspecified arm, initial encounter for closed fracture: Secondary | ICD-10-CM | POA: Diagnosis not present

## 2013-09-11 DIAGNOSIS — E039 Hypothyroidism, unspecified: Secondary | ICD-10-CM | POA: Diagnosis not present

## 2013-09-11 DIAGNOSIS — E782 Mixed hyperlipidemia: Secondary | ICD-10-CM | POA: Diagnosis not present

## 2013-09-17 DIAGNOSIS — IMO0002 Reserved for concepts with insufficient information to code with codable children: Secondary | ICD-10-CM | POA: Diagnosis not present

## 2013-09-17 DIAGNOSIS — N390 Urinary tract infection, site not specified: Secondary | ICD-10-CM | POA: Diagnosis not present

## 2013-09-17 DIAGNOSIS — Z888 Allergy status to other drugs, medicaments and biological substances status: Secondary | ICD-10-CM | POA: Diagnosis not present

## 2013-09-17 DIAGNOSIS — F79 Unspecified intellectual disabilities: Secondary | ICD-10-CM | POA: Diagnosis not present

## 2013-09-17 DIAGNOSIS — R45851 Suicidal ideations: Secondary | ICD-10-CM | POA: Diagnosis not present

## 2013-09-17 DIAGNOSIS — F259 Schizoaffective disorder, unspecified: Secondary | ICD-10-CM | POA: Diagnosis not present

## 2013-09-17 DIAGNOSIS — M25529 Pain in unspecified elbow: Secondary | ICD-10-CM | POA: Diagnosis not present

## 2013-09-26 DIAGNOSIS — F259 Schizoaffective disorder, unspecified: Secondary | ICD-10-CM | POA: Diagnosis not present

## 2013-10-06 DIAGNOSIS — F70 Mild intellectual disabilities: Secondary | ICD-10-CM | POA: Diagnosis not present

## 2013-10-06 DIAGNOSIS — F259 Schizoaffective disorder, unspecified: Secondary | ICD-10-CM | POA: Diagnosis not present

## 2013-10-13 DIAGNOSIS — I10 Essential (primary) hypertension: Secondary | ICD-10-CM | POA: Diagnosis not present

## 2013-10-13 DIAGNOSIS — E119 Type 2 diabetes mellitus without complications: Secondary | ICD-10-CM | POA: Diagnosis not present

## 2013-10-13 DIAGNOSIS — D696 Thrombocytopenia, unspecified: Secondary | ICD-10-CM | POA: Diagnosis not present

## 2013-10-13 DIAGNOSIS — A6000 Herpesviral infection of urogenital system, unspecified: Secondary | ICD-10-CM | POA: Diagnosis not present

## 2013-10-13 DIAGNOSIS — S42309A Unspecified fracture of shaft of humerus, unspecified arm, initial encounter for closed fracture: Secondary | ICD-10-CM | POA: Diagnosis not present

## 2013-10-13 DIAGNOSIS — K219 Gastro-esophageal reflux disease without esophagitis: Secondary | ICD-10-CM | POA: Diagnosis not present

## 2013-10-13 DIAGNOSIS — G4733 Obstructive sleep apnea (adult) (pediatric): Secondary | ICD-10-CM | POA: Diagnosis not present

## 2013-10-13 DIAGNOSIS — E782 Mixed hyperlipidemia: Secondary | ICD-10-CM | POA: Diagnosis not present

## 2013-10-20 DIAGNOSIS — F250 Schizoaffective disorder, bipolar type: Secondary | ICD-10-CM | POA: Diagnosis not present

## 2013-10-23 DIAGNOSIS — M25561 Pain in right knee: Secondary | ICD-10-CM | POA: Diagnosis not present

## 2013-10-31 DIAGNOSIS — F250 Schizoaffective disorder, bipolar type: Secondary | ICD-10-CM | POA: Diagnosis not present

## 2013-11-29 DIAGNOSIS — E1165 Type 2 diabetes mellitus with hyperglycemia: Secondary | ICD-10-CM | POA: Diagnosis not present

## 2013-11-29 DIAGNOSIS — Z79899 Other long term (current) drug therapy: Secondary | ICD-10-CM | POA: Diagnosis not present

## 2013-11-29 DIAGNOSIS — E039 Hypothyroidism, unspecified: Secondary | ICD-10-CM | POA: Diagnosis not present

## 2013-11-29 DIAGNOSIS — E785 Hyperlipidemia, unspecified: Secondary | ICD-10-CM | POA: Diagnosis not present

## 2013-11-29 DIAGNOSIS — F209 Schizophrenia, unspecified: Secondary | ICD-10-CM | POA: Diagnosis not present

## 2013-11-29 DIAGNOSIS — I10 Essential (primary) hypertension: Secondary | ICD-10-CM | POA: Diagnosis not present

## 2013-11-30 DIAGNOSIS — E1165 Type 2 diabetes mellitus with hyperglycemia: Secondary | ICD-10-CM | POA: Diagnosis not present

## 2013-12-12 DIAGNOSIS — F250 Schizoaffective disorder, bipolar type: Secondary | ICD-10-CM | POA: Diagnosis not present

## 2013-12-28 DIAGNOSIS — Z79899 Other long term (current) drug therapy: Secondary | ICD-10-CM | POA: Diagnosis not present

## 2013-12-28 DIAGNOSIS — M25569 Pain in unspecified knee: Secondary | ICD-10-CM | POA: Diagnosis not present

## 2014-01-08 DIAGNOSIS — F250 Schizoaffective disorder, bipolar type: Secondary | ICD-10-CM | POA: Diagnosis not present

## 2014-01-08 DIAGNOSIS — I10 Essential (primary) hypertension: Secondary | ICD-10-CM | POA: Diagnosis not present

## 2014-01-08 DIAGNOSIS — E119 Type 2 diabetes mellitus without complications: Secondary | ICD-10-CM | POA: Diagnosis not present

## 2014-01-08 DIAGNOSIS — R45850 Homicidal ideations: Secondary | ICD-10-CM | POA: Diagnosis not present

## 2014-01-08 DIAGNOSIS — F603 Borderline personality disorder: Secondary | ICD-10-CM | POA: Diagnosis not present

## 2014-01-08 DIAGNOSIS — Z30013 Encounter for initial prescription of injectable contraceptive: Secondary | ICD-10-CM | POA: Diagnosis not present

## 2014-01-08 DIAGNOSIS — E039 Hypothyroidism, unspecified: Secondary | ICD-10-CM | POA: Diagnosis not present

## 2014-01-08 DIAGNOSIS — R45851 Suicidal ideations: Secondary | ICD-10-CM | POA: Diagnosis not present

## 2014-01-08 DIAGNOSIS — F202 Catatonic schizophrenia: Secondary | ICD-10-CM | POA: Diagnosis not present

## 2014-01-08 DIAGNOSIS — Z79899 Other long term (current) drug therapy: Secondary | ICD-10-CM | POA: Diagnosis not present

## 2014-01-09 DIAGNOSIS — E039 Hypothyroidism, unspecified: Secondary | ICD-10-CM | POA: Diagnosis present

## 2014-01-09 DIAGNOSIS — R45851 Suicidal ideations: Secondary | ICD-10-CM | POA: Diagnosis not present

## 2014-01-09 DIAGNOSIS — B009 Herpesviral infection, unspecified: Secondary | ICD-10-CM | POA: Diagnosis present

## 2014-01-09 DIAGNOSIS — Z62811 Personal history of psychological abuse in childhood: Secondary | ICD-10-CM | POA: Diagnosis present

## 2014-01-09 DIAGNOSIS — K5909 Other constipation: Secondary | ICD-10-CM | POA: Diagnosis present

## 2014-01-09 DIAGNOSIS — R45850 Homicidal ideations: Secondary | ICD-10-CM | POA: Diagnosis not present

## 2014-01-09 DIAGNOSIS — F419 Anxiety disorder, unspecified: Secondary | ICD-10-CM | POA: Diagnosis present

## 2014-01-09 DIAGNOSIS — F209 Schizophrenia, unspecified: Secondary | ICD-10-CM | POA: Diagnosis not present

## 2014-01-09 DIAGNOSIS — B1920 Unspecified viral hepatitis C without hepatic coma: Secondary | ICD-10-CM | POA: Diagnosis present

## 2014-01-09 DIAGNOSIS — K219 Gastro-esophageal reflux disease without esophagitis: Secondary | ICD-10-CM | POA: Diagnosis present

## 2014-01-09 DIAGNOSIS — E119 Type 2 diabetes mellitus without complications: Secondary | ICD-10-CM | POA: Diagnosis present

## 2014-01-09 DIAGNOSIS — I10 Essential (primary) hypertension: Secondary | ICD-10-CM | POA: Diagnosis present

## 2014-01-09 DIAGNOSIS — F250 Schizoaffective disorder, bipolar type: Secondary | ICD-10-CM | POA: Diagnosis not present

## 2014-01-09 DIAGNOSIS — E559 Vitamin D deficiency, unspecified: Secondary | ICD-10-CM | POA: Diagnosis present

## 2014-01-09 DIAGNOSIS — F603 Borderline personality disorder: Secondary | ICD-10-CM | POA: Diagnosis present

## 2014-01-29 DIAGNOSIS — F250 Schizoaffective disorder, bipolar type: Secondary | ICD-10-CM | POA: Diagnosis not present

## 2014-01-31 DIAGNOSIS — L0231 Cutaneous abscess of buttock: Secondary | ICD-10-CM | POA: Diagnosis not present

## 2014-02-27 DIAGNOSIS — F250 Schizoaffective disorder, bipolar type: Secondary | ICD-10-CM | POA: Diagnosis not present

## 2014-03-21 DIAGNOSIS — F411 Generalized anxiety disorder: Secondary | ICD-10-CM | POA: Diagnosis not present

## 2014-03-21 DIAGNOSIS — M25569 Pain in unspecified knee: Secondary | ICD-10-CM | POA: Diagnosis not present

## 2014-03-21 DIAGNOSIS — Z6833 Body mass index (BMI) 33.0-33.9, adult: Secondary | ICD-10-CM | POA: Diagnosis not present

## 2014-03-26 DIAGNOSIS — F250 Schizoaffective disorder, bipolar type: Secondary | ICD-10-CM | POA: Diagnosis not present

## 2014-04-04 DIAGNOSIS — L739 Follicular disorder, unspecified: Secondary | ICD-10-CM | POA: Diagnosis not present

## 2014-04-04 DIAGNOSIS — N763 Subacute and chronic vulvitis: Secondary | ICD-10-CM | POA: Diagnosis not present

## 2014-04-05 DIAGNOSIS — H6593 Unspecified nonsuppurative otitis media, bilateral: Secondary | ICD-10-CM | POA: Diagnosis not present

## 2014-04-05 DIAGNOSIS — Z6833 Body mass index (BMI) 33.0-33.9, adult: Secondary | ICD-10-CM | POA: Diagnosis not present

## 2014-04-09 DIAGNOSIS — N763 Subacute and chronic vulvitis: Secondary | ICD-10-CM | POA: Diagnosis not present

## 2014-04-09 DIAGNOSIS — L739 Follicular disorder, unspecified: Secondary | ICD-10-CM | POA: Diagnosis not present

## 2014-04-11 DIAGNOSIS — R45851 Suicidal ideations: Secondary | ICD-10-CM | POA: Diagnosis not present

## 2014-04-11 DIAGNOSIS — E1165 Type 2 diabetes mellitus with hyperglycemia: Secondary | ICD-10-CM | POA: Diagnosis not present

## 2014-04-11 DIAGNOSIS — F329 Major depressive disorder, single episode, unspecified: Secondary | ICD-10-CM | POA: Diagnosis not present

## 2014-04-11 DIAGNOSIS — E039 Hypothyroidism, unspecified: Secondary | ICD-10-CM | POA: Diagnosis not present

## 2014-04-11 DIAGNOSIS — I10 Essential (primary) hypertension: Secondary | ICD-10-CM | POA: Diagnosis not present

## 2014-04-11 DIAGNOSIS — Z79899 Other long term (current) drug therapy: Secondary | ICD-10-CM | POA: Diagnosis not present

## 2014-04-11 DIAGNOSIS — F209 Schizophrenia, unspecified: Secondary | ICD-10-CM | POA: Diagnosis not present

## 2014-04-13 DIAGNOSIS — F250 Schizoaffective disorder, bipolar type: Secondary | ICD-10-CM | POA: Diagnosis not present

## 2014-04-13 DIAGNOSIS — F603 Borderline personality disorder: Secondary | ICD-10-CM | POA: Diagnosis not present

## 2014-04-13 DIAGNOSIS — Z794 Long term (current) use of insulin: Secondary | ICD-10-CM | POA: Diagnosis not present

## 2014-04-13 DIAGNOSIS — F329 Major depressive disorder, single episode, unspecified: Secondary | ICD-10-CM | POA: Diagnosis not present

## 2014-04-13 DIAGNOSIS — I10 Essential (primary) hypertension: Secondary | ICD-10-CM | POA: Diagnosis present

## 2014-04-13 DIAGNOSIS — F209 Schizophrenia, unspecified: Secondary | ICD-10-CM | POA: Diagnosis not present

## 2014-04-13 DIAGNOSIS — R45851 Suicidal ideations: Secondary | ICD-10-CM | POA: Diagnosis not present

## 2014-04-13 DIAGNOSIS — E1165 Type 2 diabetes mellitus with hyperglycemia: Secondary | ICD-10-CM | POA: Diagnosis present

## 2014-04-13 DIAGNOSIS — E118 Type 2 diabetes mellitus with unspecified complications: Secondary | ICD-10-CM | POA: Diagnosis not present

## 2014-04-13 DIAGNOSIS — E039 Hypothyroidism, unspecified: Secondary | ICD-10-CM | POA: Diagnosis not present

## 2014-04-20 DIAGNOSIS — F411 Generalized anxiety disorder: Secondary | ICD-10-CM | POA: Diagnosis not present

## 2014-04-20 DIAGNOSIS — E1165 Type 2 diabetes mellitus with hyperglycemia: Secondary | ICD-10-CM | POA: Diagnosis not present

## 2014-04-20 DIAGNOSIS — Z6834 Body mass index (BMI) 34.0-34.9, adult: Secondary | ICD-10-CM | POA: Diagnosis not present

## 2014-04-20 DIAGNOSIS — M25569 Pain in unspecified knee: Secondary | ICD-10-CM | POA: Diagnosis not present

## 2014-04-20 DIAGNOSIS — Z79899 Other long term (current) drug therapy: Secondary | ICD-10-CM | POA: Diagnosis not present

## 2014-04-23 DIAGNOSIS — F250 Schizoaffective disorder, bipolar type: Secondary | ICD-10-CM | POA: Diagnosis not present

## 2014-04-26 DIAGNOSIS — N763 Subacute and chronic vulvitis: Secondary | ICD-10-CM | POA: Diagnosis not present

## 2014-04-26 DIAGNOSIS — L739 Follicular disorder, unspecified: Secondary | ICD-10-CM | POA: Diagnosis not present

## 2014-04-27 DIAGNOSIS — Z79899 Other long term (current) drug therapy: Secondary | ICD-10-CM | POA: Diagnosis not present

## 2014-05-02 DIAGNOSIS — E11329 Type 2 diabetes mellitus with mild nonproliferative diabetic retinopathy without macular edema: Secondary | ICD-10-CM | POA: Diagnosis not present

## 2014-05-02 DIAGNOSIS — H524 Presbyopia: Secondary | ICD-10-CM | POA: Diagnosis not present

## 2014-05-07 DIAGNOSIS — F250 Schizoaffective disorder, bipolar type: Secondary | ICD-10-CM | POA: Diagnosis not present

## 2014-05-21 DIAGNOSIS — F250 Schizoaffective disorder, bipolar type: Secondary | ICD-10-CM | POA: Diagnosis not present

## 2014-05-25 DIAGNOSIS — M25561 Pain in right knee: Secondary | ICD-10-CM | POA: Diagnosis not present

## 2014-06-04 DIAGNOSIS — R45851 Suicidal ideations: Secondary | ICD-10-CM | POA: Diagnosis not present

## 2014-06-04 DIAGNOSIS — T887XXA Unspecified adverse effect of drug or medicament, initial encounter: Secondary | ICD-10-CM | POA: Diagnosis not present

## 2014-06-06 DIAGNOSIS — R45851 Suicidal ideations: Secondary | ICD-10-CM | POA: Diagnosis not present

## 2014-06-06 DIAGNOSIS — T50904A Poisoning by unspecified drugs, medicaments and biological substances, undetermined, initial encounter: Secondary | ICD-10-CM | POA: Diagnosis not present

## 2014-06-06 DIAGNOSIS — I10 Essential (primary) hypertension: Secondary | ICD-10-CM | POA: Diagnosis not present

## 2014-06-06 DIAGNOSIS — F329 Major depressive disorder, single episode, unspecified: Secondary | ICD-10-CM | POA: Diagnosis not present

## 2014-06-06 DIAGNOSIS — T424X2A Poisoning by benzodiazepines, intentional self-harm, initial encounter: Secondary | ICD-10-CM | POA: Diagnosis not present

## 2014-06-06 DIAGNOSIS — E039 Hypothyroidism, unspecified: Secondary | ICD-10-CM | POA: Diagnosis not present

## 2014-06-06 DIAGNOSIS — T39392A Poisoning by other nonsteroidal anti-inflammatory drugs [NSAID], intentional self-harm, initial encounter: Secondary | ICD-10-CM | POA: Diagnosis not present

## 2014-06-06 DIAGNOSIS — Z79899 Other long term (current) drug therapy: Secondary | ICD-10-CM | POA: Diagnosis not present

## 2014-06-06 DIAGNOSIS — Z794 Long term (current) use of insulin: Secondary | ICD-10-CM | POA: Diagnosis not present

## 2014-06-06 DIAGNOSIS — E119 Type 2 diabetes mellitus without complications: Secondary | ICD-10-CM | POA: Diagnosis not present

## 2014-06-07 DIAGNOSIS — T39392A Poisoning by other nonsteroidal anti-inflammatory drugs [NSAID], intentional self-harm, initial encounter: Secondary | ICD-10-CM | POA: Diagnosis not present

## 2014-06-07 DIAGNOSIS — E039 Hypothyroidism, unspecified: Secondary | ICD-10-CM | POA: Diagnosis not present

## 2014-06-07 DIAGNOSIS — T424X2A Poisoning by benzodiazepines, intentional self-harm, initial encounter: Secondary | ICD-10-CM | POA: Diagnosis not present

## 2014-06-07 DIAGNOSIS — F329 Major depressive disorder, single episode, unspecified: Secondary | ICD-10-CM | POA: Diagnosis not present

## 2014-06-07 DIAGNOSIS — I10 Essential (primary) hypertension: Secondary | ICD-10-CM | POA: Diagnosis not present

## 2014-06-07 DIAGNOSIS — F250 Schizoaffective disorder, bipolar type: Secondary | ICD-10-CM | POA: Diagnosis not present

## 2014-06-07 DIAGNOSIS — E119 Type 2 diabetes mellitus without complications: Secondary | ICD-10-CM | POA: Diagnosis not present

## 2014-06-26 DIAGNOSIS — F250 Schizoaffective disorder, bipolar type: Secondary | ICD-10-CM | POA: Diagnosis not present

## 2014-06-26 DIAGNOSIS — F603 Borderline personality disorder: Secondary | ICD-10-CM | POA: Diagnosis not present

## 2014-06-27 DIAGNOSIS — Z30013 Encounter for initial prescription of injectable contraceptive: Secondary | ICD-10-CM | POA: Diagnosis not present

## 2014-06-28 DIAGNOSIS — Z124 Encounter for screening for malignant neoplasm of cervix: Secondary | ICD-10-CM | POA: Diagnosis not present

## 2014-06-28 DIAGNOSIS — Z01419 Encounter for gynecological examination (general) (routine) without abnormal findings: Secondary | ICD-10-CM | POA: Diagnosis not present

## 2014-06-28 DIAGNOSIS — Z30013 Encounter for initial prescription of injectable contraceptive: Secondary | ICD-10-CM | POA: Diagnosis not present

## 2014-06-28 DIAGNOSIS — R87619 Unspecified abnormal cytological findings in specimens from cervix uteri: Secondary | ICD-10-CM | POA: Diagnosis not present

## 2014-06-29 DIAGNOSIS — F250 Schizoaffective disorder, bipolar type: Secondary | ICD-10-CM | POA: Diagnosis not present

## 2014-07-03 DIAGNOSIS — F603 Borderline personality disorder: Secondary | ICD-10-CM | POA: Diagnosis not present

## 2014-07-03 DIAGNOSIS — R45850 Homicidal ideations: Secondary | ICD-10-CM | POA: Diagnosis not present

## 2014-07-03 DIAGNOSIS — E039 Hypothyroidism, unspecified: Secondary | ICD-10-CM | POA: Diagnosis not present

## 2014-07-03 DIAGNOSIS — Z1231 Encounter for screening mammogram for malignant neoplasm of breast: Secondary | ICD-10-CM | POA: Diagnosis not present

## 2014-07-03 DIAGNOSIS — Z79899 Other long term (current) drug therapy: Secondary | ICD-10-CM | POA: Diagnosis not present

## 2014-07-03 DIAGNOSIS — I10 Essential (primary) hypertension: Secondary | ICD-10-CM | POA: Diagnosis not present

## 2014-07-03 DIAGNOSIS — Z794 Long term (current) use of insulin: Secondary | ICD-10-CM | POA: Diagnosis not present

## 2014-07-03 DIAGNOSIS — E119 Type 2 diabetes mellitus without complications: Secondary | ICD-10-CM | POA: Diagnosis not present

## 2014-07-03 DIAGNOSIS — F209 Schizophrenia, unspecified: Secondary | ICD-10-CM | POA: Diagnosis not present

## 2014-07-04 DIAGNOSIS — F250 Schizoaffective disorder, bipolar type: Secondary | ICD-10-CM | POA: Diagnosis not present

## 2014-07-04 DIAGNOSIS — F603 Borderline personality disorder: Secondary | ICD-10-CM | POA: Diagnosis not present

## 2014-07-10 DIAGNOSIS — F250 Schizoaffective disorder, bipolar type: Secondary | ICD-10-CM | POA: Diagnosis not present

## 2014-07-11 DIAGNOSIS — F250 Schizoaffective disorder, bipolar type: Secondary | ICD-10-CM | POA: Diagnosis not present

## 2014-07-11 DIAGNOSIS — F603 Borderline personality disorder: Secondary | ICD-10-CM | POA: Diagnosis not present

## 2014-07-17 DIAGNOSIS — F250 Schizoaffective disorder, bipolar type: Secondary | ICD-10-CM | POA: Diagnosis not present

## 2014-07-17 DIAGNOSIS — F603 Borderline personality disorder: Secondary | ICD-10-CM | POA: Diagnosis not present

## 2014-07-31 DIAGNOSIS — F250 Schizoaffective disorder, bipolar type: Secondary | ICD-10-CM | POA: Diagnosis not present

## 2014-08-20 DIAGNOSIS — F250 Schizoaffective disorder, bipolar type: Secondary | ICD-10-CM | POA: Diagnosis not present

## 2014-08-20 DIAGNOSIS — F603 Borderline personality disorder: Secondary | ICD-10-CM | POA: Diagnosis not present

## 2014-08-21 DIAGNOSIS — F329 Major depressive disorder, single episode, unspecified: Secondary | ICD-10-CM | POA: Diagnosis not present

## 2014-08-21 DIAGNOSIS — Z79899 Other long term (current) drug therapy: Secondary | ICD-10-CM | POA: Diagnosis not present

## 2014-08-21 DIAGNOSIS — Z6839 Body mass index (BMI) 39.0-39.9, adult: Secondary | ICD-10-CM | POA: Diagnosis not present

## 2014-08-21 DIAGNOSIS — I10 Essential (primary) hypertension: Secondary | ICD-10-CM | POA: Diagnosis not present

## 2014-08-21 DIAGNOSIS — F209 Schizophrenia, unspecified: Secondary | ICD-10-CM | POA: Diagnosis not present

## 2014-08-21 DIAGNOSIS — E039 Hypothyroidism, unspecified: Secondary | ICD-10-CM | POA: Diagnosis not present

## 2014-08-21 DIAGNOSIS — E1165 Type 2 diabetes mellitus with hyperglycemia: Secondary | ICD-10-CM | POA: Diagnosis not present

## 2014-08-23 DIAGNOSIS — R87619 Unspecified abnormal cytological findings in specimens from cervix uteri: Secondary | ICD-10-CM | POA: Diagnosis not present

## 2014-08-23 DIAGNOSIS — N840 Polyp of corpus uteri: Secondary | ICD-10-CM | POA: Diagnosis not present

## 2014-08-28 DIAGNOSIS — F250 Schizoaffective disorder, bipolar type: Secondary | ICD-10-CM | POA: Diagnosis not present

## 2014-08-31 DIAGNOSIS — M25561 Pain in right knee: Secondary | ICD-10-CM | POA: Diagnosis not present

## 2014-09-03 DIAGNOSIS — Z79899 Other long term (current) drug therapy: Secondary | ICD-10-CM | POA: Diagnosis not present

## 2014-09-03 DIAGNOSIS — E1165 Type 2 diabetes mellitus with hyperglycemia: Secondary | ICD-10-CM | POA: Diagnosis not present

## 2014-09-03 DIAGNOSIS — Z794 Long term (current) use of insulin: Secondary | ICD-10-CM | POA: Diagnosis not present

## 2014-09-03 DIAGNOSIS — I10 Essential (primary) hypertension: Secondary | ICD-10-CM | POA: Diagnosis not present

## 2014-09-03 DIAGNOSIS — E039 Hypothyroidism, unspecified: Secondary | ICD-10-CM | POA: Diagnosis not present

## 2014-09-04 DIAGNOSIS — F603 Borderline personality disorder: Secondary | ICD-10-CM | POA: Diagnosis not present

## 2014-09-04 DIAGNOSIS — F250 Schizoaffective disorder, bipolar type: Secondary | ICD-10-CM | POA: Diagnosis not present

## 2014-09-10 DIAGNOSIS — R5381 Other malaise: Secondary | ICD-10-CM | POA: Diagnosis not present

## 2014-09-10 DIAGNOSIS — Z79899 Other long term (current) drug therapy: Secondary | ICD-10-CM | POA: Diagnosis not present

## 2014-09-10 DIAGNOSIS — E1165 Type 2 diabetes mellitus with hyperglycemia: Secondary | ICD-10-CM | POA: Diagnosis not present

## 2014-09-10 DIAGNOSIS — Z6839 Body mass index (BMI) 39.0-39.9, adult: Secondary | ICD-10-CM | POA: Diagnosis not present

## 2014-09-13 DIAGNOSIS — Z309 Encounter for contraceptive management, unspecified: Secondary | ICD-10-CM | POA: Diagnosis not present

## 2014-09-17 DIAGNOSIS — R590 Localized enlarged lymph nodes: Secondary | ICD-10-CM | POA: Diagnosis not present

## 2014-09-17 DIAGNOSIS — L03112 Cellulitis of left axilla: Secondary | ICD-10-CM | POA: Diagnosis not present

## 2014-09-17 DIAGNOSIS — E119 Type 2 diabetes mellitus without complications: Secondary | ICD-10-CM | POA: Diagnosis not present

## 2014-09-17 DIAGNOSIS — I10 Essential (primary) hypertension: Secondary | ICD-10-CM | POA: Diagnosis not present

## 2014-09-17 DIAGNOSIS — Z794 Long term (current) use of insulin: Secondary | ICD-10-CM | POA: Diagnosis not present

## 2014-09-17 DIAGNOSIS — E039 Hypothyroidism, unspecified: Secondary | ICD-10-CM | POA: Diagnosis not present

## 2014-09-17 DIAGNOSIS — Z79899 Other long term (current) drug therapy: Secondary | ICD-10-CM | POA: Diagnosis not present

## 2014-09-17 DIAGNOSIS — R591 Generalized enlarged lymph nodes: Secondary | ICD-10-CM | POA: Diagnosis not present

## 2014-09-18 DIAGNOSIS — R591 Generalized enlarged lymph nodes: Secondary | ICD-10-CM | POA: Diagnosis not present

## 2014-09-18 DIAGNOSIS — L03112 Cellulitis of left axilla: Secondary | ICD-10-CM | POA: Diagnosis not present

## 2014-09-19 DIAGNOSIS — E11329 Type 2 diabetes mellitus with mild nonproliferative diabetic retinopathy without macular edema: Secondary | ICD-10-CM | POA: Diagnosis not present

## 2014-09-19 DIAGNOSIS — L02412 Cutaneous abscess of left axilla: Secondary | ICD-10-CM | POA: Diagnosis not present

## 2014-09-21 DIAGNOSIS — E119 Type 2 diabetes mellitus without complications: Secondary | ICD-10-CM | POA: Diagnosis not present

## 2014-09-21 DIAGNOSIS — L02412 Cutaneous abscess of left axilla: Secondary | ICD-10-CM | POA: Diagnosis not present

## 2014-09-21 DIAGNOSIS — Z4801 Encounter for change or removal of surgical wound dressing: Secondary | ICD-10-CM | POA: Diagnosis not present

## 2014-09-23 DIAGNOSIS — L0291 Cutaneous abscess, unspecified: Secondary | ICD-10-CM | POA: Diagnosis not present

## 2014-09-26 DIAGNOSIS — Z6839 Body mass index (BMI) 39.0-39.9, adult: Secondary | ICD-10-CM | POA: Diagnosis not present

## 2014-09-26 DIAGNOSIS — L02419 Cutaneous abscess of limb, unspecified: Secondary | ICD-10-CM | POA: Diagnosis not present

## 2014-09-28 DIAGNOSIS — F250 Schizoaffective disorder, bipolar type: Secondary | ICD-10-CM | POA: Diagnosis not present

## 2014-09-28 DIAGNOSIS — F603 Borderline personality disorder: Secondary | ICD-10-CM | POA: Diagnosis not present

## 2014-10-01 DIAGNOSIS — E1165 Type 2 diabetes mellitus with hyperglycemia: Secondary | ICD-10-CM | POA: Diagnosis not present

## 2014-10-01 DIAGNOSIS — Z6839 Body mass index (BMI) 39.0-39.9, adult: Secondary | ICD-10-CM | POA: Diagnosis not present

## 2014-10-01 DIAGNOSIS — L02419 Cutaneous abscess of limb, unspecified: Secondary | ICD-10-CM | POA: Diagnosis not present

## 2014-10-02 DIAGNOSIS — F250 Schizoaffective disorder, bipolar type: Secondary | ICD-10-CM | POA: Diagnosis not present

## 2014-10-05 DIAGNOSIS — F603 Borderline personality disorder: Secondary | ICD-10-CM | POA: Diagnosis not present

## 2014-10-05 DIAGNOSIS — F250 Schizoaffective disorder, bipolar type: Secondary | ICD-10-CM | POA: Diagnosis not present

## 2014-10-19 DIAGNOSIS — F250 Schizoaffective disorder, bipolar type: Secondary | ICD-10-CM | POA: Diagnosis not present

## 2014-10-19 DIAGNOSIS — F603 Borderline personality disorder: Secondary | ICD-10-CM | POA: Diagnosis not present

## 2014-10-26 DIAGNOSIS — F603 Borderline personality disorder: Secondary | ICD-10-CM | POA: Diagnosis not present

## 2014-10-26 DIAGNOSIS — F250 Schizoaffective disorder, bipolar type: Secondary | ICD-10-CM | POA: Diagnosis not present

## 2014-10-30 DIAGNOSIS — F250 Schizoaffective disorder, bipolar type: Secondary | ICD-10-CM | POA: Diagnosis not present

## 2014-11-17 DIAGNOSIS — R45851 Suicidal ideations: Secondary | ICD-10-CM | POA: Diagnosis not present

## 2014-11-17 DIAGNOSIS — Z79899 Other long term (current) drug therapy: Secondary | ICD-10-CM | POA: Diagnosis not present

## 2014-11-17 DIAGNOSIS — Z7984 Long term (current) use of oral hypoglycemic drugs: Secondary | ICD-10-CM | POA: Diagnosis not present

## 2014-11-17 DIAGNOSIS — E039 Hypothyroidism, unspecified: Secondary | ICD-10-CM | POA: Diagnosis not present

## 2014-11-17 DIAGNOSIS — E119 Type 2 diabetes mellitus without complications: Secondary | ICD-10-CM | POA: Diagnosis not present

## 2014-11-17 DIAGNOSIS — F200 Paranoid schizophrenia: Secondary | ICD-10-CM | POA: Diagnosis not present

## 2014-11-17 DIAGNOSIS — F603 Borderline personality disorder: Secondary | ICD-10-CM | POA: Diagnosis not present

## 2014-11-17 DIAGNOSIS — R440 Auditory hallucinations: Secondary | ICD-10-CM | POA: Diagnosis not present

## 2014-11-17 DIAGNOSIS — Z794 Long term (current) use of insulin: Secondary | ICD-10-CM | POA: Diagnosis not present

## 2014-11-17 DIAGNOSIS — I10 Essential (primary) hypertension: Secondary | ICD-10-CM | POA: Diagnosis not present

## 2014-11-21 DIAGNOSIS — E039 Hypothyroidism, unspecified: Secondary | ICD-10-CM | POA: Diagnosis not present

## 2014-11-21 DIAGNOSIS — Z09 Encounter for follow-up examination after completed treatment for conditions other than malignant neoplasm: Secondary | ICD-10-CM | POA: Diagnosis not present

## 2014-11-21 DIAGNOSIS — E1165 Type 2 diabetes mellitus with hyperglycemia: Secondary | ICD-10-CM | POA: Diagnosis not present

## 2014-11-21 DIAGNOSIS — I10 Essential (primary) hypertension: Secondary | ICD-10-CM | POA: Diagnosis not present

## 2014-11-21 DIAGNOSIS — F209 Schizophrenia, unspecified: Secondary | ICD-10-CM | POA: Diagnosis not present

## 2014-11-21 DIAGNOSIS — Z6839 Body mass index (BMI) 39.0-39.9, adult: Secondary | ICD-10-CM | POA: Diagnosis not present

## 2014-11-21 DIAGNOSIS — Z79899 Other long term (current) drug therapy: Secondary | ICD-10-CM | POA: Diagnosis not present

## 2014-11-21 DIAGNOSIS — E663 Overweight: Secondary | ICD-10-CM | POA: Diagnosis not present

## 2014-11-29 DIAGNOSIS — Z309 Encounter for contraceptive management, unspecified: Secondary | ICD-10-CM | POA: Diagnosis not present

## 2014-11-30 DIAGNOSIS — Z6839 Body mass index (BMI) 39.0-39.9, adult: Secondary | ICD-10-CM | POA: Diagnosis not present

## 2014-11-30 DIAGNOSIS — L0291 Cutaneous abscess, unspecified: Secondary | ICD-10-CM | POA: Diagnosis not present

## 2014-12-03 DIAGNOSIS — F250 Schizoaffective disorder, bipolar type: Secondary | ICD-10-CM | POA: Diagnosis not present

## 2014-12-17 DIAGNOSIS — F250 Schizoaffective disorder, bipolar type: Secondary | ICD-10-CM | POA: Diagnosis not present

## 2014-12-17 DIAGNOSIS — F603 Borderline personality disorder: Secondary | ICD-10-CM | POA: Diagnosis not present

## 2014-12-31 DIAGNOSIS — F250 Schizoaffective disorder, bipolar type: Secondary | ICD-10-CM | POA: Diagnosis not present

## 2015-01-18 DIAGNOSIS — I10 Essential (primary) hypertension: Secondary | ICD-10-CM | POA: Diagnosis not present

## 2015-01-18 DIAGNOSIS — Z79899 Other long term (current) drug therapy: Secondary | ICD-10-CM | POA: Diagnosis not present

## 2015-01-18 DIAGNOSIS — F209 Schizophrenia, unspecified: Secondary | ICD-10-CM | POA: Diagnosis not present

## 2015-01-18 DIAGNOSIS — F250 Schizoaffective disorder, bipolar type: Secondary | ICD-10-CM | POA: Diagnosis not present

## 2015-01-18 DIAGNOSIS — Z794 Long term (current) use of insulin: Secondary | ICD-10-CM | POA: Diagnosis not present

## 2015-01-18 DIAGNOSIS — F332 Major depressive disorder, recurrent severe without psychotic features: Secondary | ICD-10-CM | POA: Diagnosis not present

## 2015-01-18 DIAGNOSIS — E119 Type 2 diabetes mellitus without complications: Secondary | ICD-10-CM | POA: Diagnosis not present

## 2015-01-18 DIAGNOSIS — E039 Hypothyroidism, unspecified: Secondary | ICD-10-CM | POA: Diagnosis not present

## 2015-01-18 DIAGNOSIS — R45851 Suicidal ideations: Secondary | ICD-10-CM | POA: Diagnosis not present

## 2015-01-18 DIAGNOSIS — R440 Auditory hallucinations: Secondary | ICD-10-CM | POA: Diagnosis not present

## 2015-01-19 DIAGNOSIS — Z794 Long term (current) use of insulin: Secondary | ICD-10-CM | POA: Diagnosis not present

## 2015-01-19 DIAGNOSIS — E039 Hypothyroidism, unspecified: Secondary | ICD-10-CM | POA: Diagnosis not present

## 2015-01-19 DIAGNOSIS — E119 Type 2 diabetes mellitus without complications: Secondary | ICD-10-CM | POA: Diagnosis not present

## 2015-01-19 DIAGNOSIS — F419 Anxiety disorder, unspecified: Secondary | ICD-10-CM | POA: Diagnosis not present

## 2015-01-19 DIAGNOSIS — K219 Gastro-esophageal reflux disease without esophagitis: Secondary | ICD-10-CM | POA: Diagnosis not present

## 2015-01-19 DIAGNOSIS — F332 Major depressive disorder, recurrent severe without psychotic features: Secondary | ICD-10-CM | POA: Diagnosis not present

## 2015-01-19 DIAGNOSIS — R45851 Suicidal ideations: Secondary | ICD-10-CM | POA: Diagnosis not present

## 2015-01-19 DIAGNOSIS — F603 Borderline personality disorder: Secondary | ICD-10-CM | POA: Diagnosis not present

## 2015-01-19 DIAGNOSIS — R440 Auditory hallucinations: Secondary | ICD-10-CM | POA: Diagnosis not present

## 2015-01-19 DIAGNOSIS — M1990 Unspecified osteoarthritis, unspecified site: Secondary | ICD-10-CM | POA: Diagnosis not present

## 2015-01-19 DIAGNOSIS — Z23 Encounter for immunization: Secondary | ICD-10-CM | POA: Diagnosis not present

## 2015-01-19 DIAGNOSIS — F209 Schizophrenia, unspecified: Secondary | ICD-10-CM | POA: Diagnosis not present

## 2015-01-19 DIAGNOSIS — B009 Herpesviral infection, unspecified: Secondary | ICD-10-CM | POA: Diagnosis not present

## 2015-01-19 DIAGNOSIS — I10 Essential (primary) hypertension: Secondary | ICD-10-CM | POA: Diagnosis not present

## 2015-01-19 DIAGNOSIS — F329 Major depressive disorder, single episode, unspecified: Secondary | ICD-10-CM | POA: Diagnosis not present

## 2015-01-29 DIAGNOSIS — F250 Schizoaffective disorder, bipolar type: Secondary | ICD-10-CM | POA: Diagnosis not present

## 2015-02-08 DIAGNOSIS — F250 Schizoaffective disorder, bipolar type: Secondary | ICD-10-CM | POA: Diagnosis not present

## 2015-02-08 DIAGNOSIS — F603 Borderline personality disorder: Secondary | ICD-10-CM | POA: Diagnosis not present

## 2015-02-14 DIAGNOSIS — Z309 Encounter for contraceptive management, unspecified: Secondary | ICD-10-CM | POA: Diagnosis not present

## 2015-02-18 DIAGNOSIS — F603 Borderline personality disorder: Secondary | ICD-10-CM | POA: Diagnosis not present

## 2015-02-18 DIAGNOSIS — F250 Schizoaffective disorder, bipolar type: Secondary | ICD-10-CM | POA: Diagnosis not present

## 2015-02-27 DIAGNOSIS — E1165 Type 2 diabetes mellitus with hyperglycemia: Secondary | ICD-10-CM | POA: Diagnosis not present

## 2015-02-27 DIAGNOSIS — E039 Hypothyroidism, unspecified: Secondary | ICD-10-CM | POA: Diagnosis not present

## 2015-02-27 DIAGNOSIS — F209 Schizophrenia, unspecified: Secondary | ICD-10-CM | POA: Diagnosis not present

## 2015-02-27 DIAGNOSIS — I10 Essential (primary) hypertension: Secondary | ICD-10-CM | POA: Diagnosis not present

## 2015-02-27 DIAGNOSIS — Z6839 Body mass index (BMI) 39.0-39.9, adult: Secondary | ICD-10-CM | POA: Diagnosis not present

## 2015-02-27 DIAGNOSIS — Z79899 Other long term (current) drug therapy: Secondary | ICD-10-CM | POA: Diagnosis not present

## 2015-03-01 DIAGNOSIS — E1165 Type 2 diabetes mellitus with hyperglycemia: Secondary | ICD-10-CM | POA: Diagnosis not present

## 2015-03-05 DIAGNOSIS — F250 Schizoaffective disorder, bipolar type: Secondary | ICD-10-CM | POA: Diagnosis not present

## 2015-03-08 DIAGNOSIS — F603 Borderline personality disorder: Secondary | ICD-10-CM | POA: Diagnosis not present

## 2015-03-08 DIAGNOSIS — F250 Schizoaffective disorder, bipolar type: Secondary | ICD-10-CM | POA: Diagnosis not present

## 2015-04-15 DIAGNOSIS — F250 Schizoaffective disorder, bipolar type: Secondary | ICD-10-CM | POA: Diagnosis not present

## 2015-05-06 DIAGNOSIS — Z3042 Encounter for surveillance of injectable contraceptive: Secondary | ICD-10-CM | POA: Diagnosis not present

## 2015-05-06 DIAGNOSIS — F250 Schizoaffective disorder, bipolar type: Secondary | ICD-10-CM | POA: Diagnosis not present

## 2015-05-27 DIAGNOSIS — Z1231 Encounter for screening mammogram for malignant neoplasm of breast: Secondary | ICD-10-CM | POA: Diagnosis not present

## 2015-05-27 DIAGNOSIS — E039 Hypothyroidism, unspecified: Secondary | ICD-10-CM | POA: Diagnosis not present

## 2015-05-27 DIAGNOSIS — I8393 Asymptomatic varicose veins of bilateral lower extremities: Secondary | ICD-10-CM | POA: Diagnosis not present

## 2015-05-27 DIAGNOSIS — F209 Schizophrenia, unspecified: Secondary | ICD-10-CM | POA: Diagnosis not present

## 2015-05-27 DIAGNOSIS — A6009 Herpesviral infection of other urogenital tract: Secondary | ICD-10-CM | POA: Diagnosis not present

## 2015-05-27 DIAGNOSIS — I10 Essential (primary) hypertension: Secondary | ICD-10-CM | POA: Diagnosis not present

## 2015-05-27 DIAGNOSIS — Z79899 Other long term (current) drug therapy: Secondary | ICD-10-CM | POA: Diagnosis not present

## 2015-05-27 DIAGNOSIS — F329 Major depressive disorder, single episode, unspecified: Secondary | ICD-10-CM | POA: Diagnosis not present

## 2015-05-27 DIAGNOSIS — Z6838 Body mass index (BMI) 38.0-38.9, adult: Secondary | ICD-10-CM | POA: Diagnosis not present

## 2015-05-27 DIAGNOSIS — E785 Hyperlipidemia, unspecified: Secondary | ICD-10-CM | POA: Diagnosis not present

## 2015-05-27 DIAGNOSIS — Z1389 Encounter for screening for other disorder: Secondary | ICD-10-CM | POA: Diagnosis not present

## 2015-05-27 DIAGNOSIS — E1165 Type 2 diabetes mellitus with hyperglycemia: Secondary | ICD-10-CM | POA: Diagnosis not present

## 2015-05-28 DIAGNOSIS — E1165 Type 2 diabetes mellitus with hyperglycemia: Secondary | ICD-10-CM | POA: Diagnosis not present

## 2015-06-03 DIAGNOSIS — H25013 Cortical age-related cataract, bilateral: Secondary | ICD-10-CM | POA: Diagnosis not present

## 2015-06-03 DIAGNOSIS — E113293 Type 2 diabetes mellitus with mild nonproliferative diabetic retinopathy without macular edema, bilateral: Secondary | ICD-10-CM | POA: Diagnosis not present

## 2015-06-03 DIAGNOSIS — F250 Schizoaffective disorder, bipolar type: Secondary | ICD-10-CM | POA: Diagnosis not present

## 2015-06-27 DIAGNOSIS — R748 Abnormal levels of other serum enzymes: Secondary | ICD-10-CM | POA: Diagnosis not present

## 2015-06-27 DIAGNOSIS — D638 Anemia in other chronic diseases classified elsewhere: Secondary | ICD-10-CM | POA: Diagnosis not present

## 2015-07-08 DIAGNOSIS — F250 Schizoaffective disorder, bipolar type: Secondary | ICD-10-CM | POA: Diagnosis not present

## 2015-07-16 DIAGNOSIS — Z1231 Encounter for screening mammogram for malignant neoplasm of breast: Secondary | ICD-10-CM | POA: Diagnosis not present

## 2015-07-16 DIAGNOSIS — R87610 Atypical squamous cells of undetermined significance on cytologic smear of cervix (ASC-US): Secondary | ICD-10-CM | POA: Diagnosis not present

## 2015-07-16 DIAGNOSIS — Z01419 Encounter for gynecological examination (general) (routine) without abnormal findings: Secondary | ICD-10-CM | POA: Diagnosis not present

## 2015-07-16 DIAGNOSIS — Z124 Encounter for screening for malignant neoplasm of cervix: Secondary | ICD-10-CM | POA: Diagnosis not present

## 2015-07-16 DIAGNOSIS — Z3009 Encounter for other general counseling and advice on contraception: Secondary | ICD-10-CM | POA: Diagnosis not present

## 2015-07-29 DIAGNOSIS — F250 Schizoaffective disorder, bipolar type: Secondary | ICD-10-CM | POA: Diagnosis not present

## 2015-08-07 DIAGNOSIS — F603 Borderline personality disorder: Secondary | ICD-10-CM | POA: Diagnosis not present

## 2015-08-07 DIAGNOSIS — F250 Schizoaffective disorder, bipolar type: Secondary | ICD-10-CM | POA: Diagnosis not present

## 2015-09-09 DIAGNOSIS — F250 Schizoaffective disorder, bipolar type: Secondary | ICD-10-CM | POA: Diagnosis not present

## 2015-09-26 DIAGNOSIS — R87619 Unspecified abnormal cytological findings in specimens from cervix uteri: Secondary | ICD-10-CM | POA: Diagnosis not present

## 2015-09-30 DIAGNOSIS — F209 Schizophrenia, unspecified: Secondary | ICD-10-CM | POA: Diagnosis not present

## 2015-09-30 DIAGNOSIS — A6009 Herpesviral infection of other urogenital tract: Secondary | ICD-10-CM | POA: Diagnosis not present

## 2015-09-30 DIAGNOSIS — I10 Essential (primary) hypertension: Secondary | ICD-10-CM | POA: Diagnosis not present

## 2015-09-30 DIAGNOSIS — E785 Hyperlipidemia, unspecified: Secondary | ICD-10-CM | POA: Diagnosis not present

## 2015-09-30 DIAGNOSIS — E039 Hypothyroidism, unspecified: Secondary | ICD-10-CM | POA: Diagnosis not present

## 2015-09-30 DIAGNOSIS — Z6838 Body mass index (BMI) 38.0-38.9, adult: Secondary | ICD-10-CM | POA: Diagnosis not present

## 2015-09-30 DIAGNOSIS — E1165 Type 2 diabetes mellitus with hyperglycemia: Secondary | ICD-10-CM | POA: Diagnosis not present

## 2015-09-30 DIAGNOSIS — F329 Major depressive disorder, single episode, unspecified: Secondary | ICD-10-CM | POA: Diagnosis not present

## 2015-09-30 DIAGNOSIS — Z79899 Other long term (current) drug therapy: Secondary | ICD-10-CM | POA: Diagnosis not present

## 2015-10-01 DIAGNOSIS — E1165 Type 2 diabetes mellitus with hyperglycemia: Secondary | ICD-10-CM | POA: Diagnosis not present

## 2015-10-07 DIAGNOSIS — F250 Schizoaffective disorder, bipolar type: Secondary | ICD-10-CM | POA: Diagnosis not present

## 2015-10-09 DIAGNOSIS — R87619 Unspecified abnormal cytological findings in specimens from cervix uteri: Secondary | ICD-10-CM | POA: Diagnosis not present

## 2015-10-21 DIAGNOSIS — Z01818 Encounter for other preprocedural examination: Secondary | ICD-10-CM | POA: Diagnosis not present

## 2015-10-21 DIAGNOSIS — N879 Dysplasia of cervix uteri, unspecified: Secondary | ICD-10-CM | POA: Diagnosis not present

## 2015-10-21 DIAGNOSIS — E1165 Type 2 diabetes mellitus with hyperglycemia: Secondary | ICD-10-CM | POA: Diagnosis not present

## 2015-10-21 DIAGNOSIS — F209 Schizophrenia, unspecified: Secondary | ICD-10-CM | POA: Diagnosis not present

## 2015-10-21 DIAGNOSIS — Z6838 Body mass index (BMI) 38.0-38.9, adult: Secondary | ICD-10-CM | POA: Diagnosis not present

## 2015-10-21 DIAGNOSIS — E039 Hypothyroidism, unspecified: Secondary | ICD-10-CM | POA: Diagnosis not present

## 2015-10-21 DIAGNOSIS — I10 Essential (primary) hypertension: Secondary | ICD-10-CM | POA: Diagnosis not present

## 2015-10-23 DIAGNOSIS — R87619 Unspecified abnormal cytological findings in specimens from cervix uteri: Secondary | ICD-10-CM | POA: Diagnosis not present

## 2015-10-29 DIAGNOSIS — E079 Disorder of thyroid, unspecified: Secondary | ICD-10-CM | POA: Diagnosis not present

## 2015-10-29 DIAGNOSIS — E669 Obesity, unspecified: Secondary | ICD-10-CM | POA: Diagnosis not present

## 2015-10-29 DIAGNOSIS — A6000 Herpesviral infection of urogenital system, unspecified: Secondary | ICD-10-CM | POA: Diagnosis not present

## 2015-10-29 DIAGNOSIS — N840 Polyp of corpus uteri: Secondary | ICD-10-CM | POA: Diagnosis not present

## 2015-10-29 DIAGNOSIS — R87619 Unspecified abnormal cytological findings in specimens from cervix uteri: Secondary | ICD-10-CM | POA: Diagnosis not present

## 2015-10-29 DIAGNOSIS — B1920 Unspecified viral hepatitis C without hepatic coma: Secondary | ICD-10-CM | POA: Diagnosis not present

## 2015-10-29 DIAGNOSIS — Z79899 Other long term (current) drug therapy: Secondary | ICD-10-CM | POA: Diagnosis not present

## 2015-10-29 DIAGNOSIS — E119 Type 2 diabetes mellitus without complications: Secondary | ICD-10-CM | POA: Diagnosis not present

## 2015-10-29 DIAGNOSIS — Z7984 Long term (current) use of oral hypoglycemic drugs: Secondary | ICD-10-CM | POA: Diagnosis not present

## 2015-10-29 DIAGNOSIS — K219 Gastro-esophageal reflux disease without esophagitis: Secondary | ICD-10-CM | POA: Diagnosis not present

## 2015-10-29 DIAGNOSIS — Z794 Long term (current) use of insulin: Secondary | ICD-10-CM | POA: Diagnosis not present

## 2015-10-29 DIAGNOSIS — Z8659 Personal history of other mental and behavioral disorders: Secondary | ICD-10-CM | POA: Diagnosis not present

## 2015-11-04 DIAGNOSIS — F250 Schizoaffective disorder, bipolar type: Secondary | ICD-10-CM | POA: Diagnosis not present

## 2015-12-02 DIAGNOSIS — F250 Schizoaffective disorder, bipolar type: Secondary | ICD-10-CM | POA: Diagnosis not present

## 2015-12-09 DIAGNOSIS — R87619 Unspecified abnormal cytological findings in specimens from cervix uteri: Secondary | ICD-10-CM | POA: Diagnosis not present

## 2016-01-28 DIAGNOSIS — F250 Schizoaffective disorder, bipolar type: Secondary | ICD-10-CM | POA: Diagnosis not present

## 2016-01-31 DIAGNOSIS — E113293 Type 2 diabetes mellitus with mild nonproliferative diabetic retinopathy without macular edema, bilateral: Secondary | ICD-10-CM | POA: Diagnosis not present

## 2016-02-19 DIAGNOSIS — F329 Major depressive disorder, single episode, unspecified: Secondary | ICD-10-CM | POA: Diagnosis not present

## 2016-02-19 DIAGNOSIS — E039 Hypothyroidism, unspecified: Secondary | ICD-10-CM | POA: Diagnosis not present

## 2016-02-19 DIAGNOSIS — E1165 Type 2 diabetes mellitus with hyperglycemia: Secondary | ICD-10-CM | POA: Diagnosis not present

## 2016-02-19 DIAGNOSIS — Z79899 Other long term (current) drug therapy: Secondary | ICD-10-CM | POA: Diagnosis not present

## 2016-02-19 DIAGNOSIS — B182 Chronic viral hepatitis C: Secondary | ICD-10-CM | POA: Diagnosis not present

## 2016-02-19 DIAGNOSIS — F209 Schizophrenia, unspecified: Secondary | ICD-10-CM | POA: Diagnosis not present

## 2016-02-19 DIAGNOSIS — E785 Hyperlipidemia, unspecified: Secondary | ICD-10-CM | POA: Diagnosis not present

## 2016-02-19 DIAGNOSIS — I10 Essential (primary) hypertension: Secondary | ICD-10-CM | POA: Diagnosis not present

## 2016-02-19 DIAGNOSIS — F603 Borderline personality disorder: Secondary | ICD-10-CM | POA: Diagnosis not present

## 2016-02-19 DIAGNOSIS — F250 Schizoaffective disorder, bipolar type: Secondary | ICD-10-CM | POA: Diagnosis not present

## 2016-02-19 DIAGNOSIS — Z6837 Body mass index (BMI) 37.0-37.9, adult: Secondary | ICD-10-CM | POA: Diagnosis not present

## 2016-02-28 ENCOUNTER — Telehealth: Payer: Self-pay | Admitting: *Deleted

## 2016-02-28 ENCOUNTER — Ambulatory Visit: Payer: Self-pay | Admitting: Gynecologic Oncology

## 2016-02-28 NOTE — Telephone Encounter (Signed)
Pt no showed appt. No working numbers in system for pt.

## 2016-02-28 NOTE — Telephone Encounter (Signed)
[place] office called back with contact # for pt. Called [phone]. Unable to reach pt. Lmovm for pt to call regarding r/s appt.

## 2016-02-28 NOTE — Telephone Encounter (Signed)
Left message for Dr Marin Roberts nurse to call office as pt no show and no contact for pt as numbers in system are invalid.

## 2016-02-28 NOTE — Progress Notes (Deleted)
Consult Note: Gyn-Onc  Consult was requested by Dr. Marland Kitchen for the evaluation of Jessica [name] 51 y.o. female  CC: No chief complaint on file.   Assessment/Plan:  Ms. CALYSSA YARDLEY  is a 51 y.o.  year old ***   HPI: ***   Interval History: ***  Current Meds:  No outpatient encounter prescriptions on file as of 02/28/2016.   No facility-administered encounter medications on file as of 02/28/2016.     Allergy: Allergies not on file  Social Hx:   Social History   Social History  . Marital status: Single    Spouse name: N/A  . Number of children: N/A  . Years of education: N/A   Occupational History  . Not on file.   Social History Main Topics  . Smoking status: Not on file  . Smokeless tobacco: Not on file  . Alcohol use Not on file  . Drug use: Unknown  . Sexual activity: Not on file   Other Topics Concern  . Not on file   Social History Narrative  . No narrative on file    Past Surgical Hx: No past surgical history on file.  Past Medical Hx: No past medical history on file.  Past Gynecological History:  *** No LMP recorded.  Family Hx: No family history on file.  Review of Systems:  Constitutional  Feels well,  ***  ENT Normal appearing ears and nares bilaterally Skin/Breast  No rash, sores, jaundice, itching, dryness Cardiovascular  No chest pain, shortness of breath, or edema  Pulmonary  No cough or wheeze.  Gastro Intestinal  No nausea, vomitting, or diarrhoea. No bright red blood per rectum, no abdominal pain, change in bowel movement, or constipation.  Genito Urinary  No frequency, urgency, dysuria, *** Musculo Skeletal  No myalgia, arthralgia, joint swelling or pain  Neurologic  No weakness, numbness, change in gait,  Psychology  No depression, anxiety, insomnia.   Vitals:  There were no vitals taken for this visit.  Physical Exam: WD in NAD Neck  Supple NROM, without any enlargements.  Lymph Node Survey No cervical  supraclavicular or inguinal adenopathy Cardiovascular  Pulse normal rate, regularity and rhythm. S1 and S2 normal.  Lungs  Clear to auscultation bilateraly, without wheezes/crackles/rhonchi. Good air movement.  Skin  No rash/lesions/breakdown  Psychiatry  Alert and oriented to person, place, and time  Abdomen  Normoactive bowel sounds, abdomen soft, non-tender and obese without evidence of hernia. *** Back No CVA tenderness Genito Urinary  Vulva/vagina: Normal external female genitalia. ***  No lesions. No discharge or bleeding.  Bladder/urethra:  No lesions or masses, well supported bladder  Vagina: ***  Cervix: Normal appearing, no lesions.  Uterus: *** Small, mobile, no parametrial involvement or nodularity.  Adnexa: *** masses. Rectal  Good tone, no masses no cul de sac nodularity.  Extremities  No bilateral cyanosis, clubbing or edema.   Donaciano Eva, MD  02/28/2016, 9:26 AM

## 2016-03-09 DIAGNOSIS — F250 Schizoaffective disorder, bipolar type: Secondary | ICD-10-CM | POA: Diagnosis not present

## 2016-03-30 DIAGNOSIS — F250 Schizoaffective disorder, bipolar type: Secondary | ICD-10-CM | POA: Diagnosis not present

## 2016-05-04 DIAGNOSIS — F250 Schizoaffective disorder, bipolar type: Secondary | ICD-10-CM | POA: Diagnosis not present

## 2016-05-27 ENCOUNTER — Telehealth: Payer: Self-pay | Admitting: Gynecologic Oncology

## 2016-05-27 NOTE — Telephone Encounter (Signed)
Called Dr. Jon Billings office to touch base about the patient's referral since we had not been able to reach her.  Butch Penny, RN stated she would reach out to Dr. Marin Roberts and let us know if the patient needed to be seen.

## 2016-06-01 DIAGNOSIS — F250 Schizoaffective disorder, bipolar type: Secondary | ICD-10-CM | POA: Diagnosis not present

## 2016-06-09 DIAGNOSIS — R87610 Atypical squamous cells of undetermined significance on cytologic smear of cervix (ASC-US): Secondary | ICD-10-CM | POA: Diagnosis not present

## 2016-06-09 DIAGNOSIS — R87619 Unspecified abnormal cytological findings in specimens from cervix uteri: Secondary | ICD-10-CM | POA: Insufficient documentation

## 2016-06-09 DIAGNOSIS — Z124 Encounter for screening for malignant neoplasm of cervix: Secondary | ICD-10-CM | POA: Diagnosis not present

## 2016-06-09 DIAGNOSIS — Z1231 Encounter for screening mammogram for malignant neoplasm of breast: Secondary | ICD-10-CM | POA: Diagnosis not present

## 2016-06-30 DIAGNOSIS — F250 Schizoaffective disorder, bipolar type: Secondary | ICD-10-CM | POA: Diagnosis not present

## 2016-08-03 DIAGNOSIS — F250 Schizoaffective disorder, bipolar type: Secondary | ICD-10-CM | POA: Diagnosis not present

## 2016-08-19 DIAGNOSIS — E113291 Type 2 diabetes mellitus with mild nonproliferative diabetic retinopathy without macular edema, right eye: Secondary | ICD-10-CM | POA: Diagnosis not present

## 2016-08-19 DIAGNOSIS — E113212 Type 2 diabetes mellitus with mild nonproliferative diabetic retinopathy with macular edema, left eye: Secondary | ICD-10-CM | POA: Diagnosis not present

## 2016-08-19 DIAGNOSIS — H2513 Age-related nuclear cataract, bilateral: Secondary | ICD-10-CM | POA: Diagnosis not present

## 2016-08-31 DIAGNOSIS — F250 Schizoaffective disorder, bipolar type: Secondary | ICD-10-CM | POA: Diagnosis not present

## 2016-09-03 DIAGNOSIS — E113393 Type 2 diabetes mellitus with moderate nonproliferative diabetic retinopathy without macular edema, bilateral: Secondary | ICD-10-CM | POA: Diagnosis not present

## 2016-09-12 DIAGNOSIS — Z751 Person awaiting admission to adequate facility elsewhere: Secondary | ICD-10-CM | POA: Diagnosis not present

## 2016-09-12 DIAGNOSIS — F259 Schizoaffective disorder, unspecified: Secondary | ICD-10-CM | POA: Diagnosis not present

## 2016-09-12 DIAGNOSIS — R456 Violent behavior: Secondary | ICD-10-CM | POA: Diagnosis not present

## 2016-09-12 DIAGNOSIS — I10 Essential (primary) hypertension: Secondary | ICD-10-CM | POA: Diagnosis not present

## 2016-09-12 DIAGNOSIS — Z794 Long term (current) use of insulin: Secondary | ICD-10-CM | POA: Diagnosis not present

## 2016-09-12 DIAGNOSIS — E039 Hypothyroidism, unspecified: Secondary | ICD-10-CM | POA: Diagnosis not present

## 2016-09-12 DIAGNOSIS — E119 Type 2 diabetes mellitus without complications: Secondary | ICD-10-CM | POA: Diagnosis not present

## 2016-09-12 DIAGNOSIS — F319 Bipolar disorder, unspecified: Secondary | ICD-10-CM | POA: Diagnosis not present

## 2016-09-12 DIAGNOSIS — Z79899 Other long term (current) drug therapy: Secondary | ICD-10-CM | POA: Diagnosis not present

## 2016-09-12 DIAGNOSIS — Z7984 Long term (current) use of oral hypoglycemic drugs: Secondary | ICD-10-CM | POA: Diagnosis not present

## 2016-09-14 DIAGNOSIS — F251 Schizoaffective disorder, depressive type: Secondary | ICD-10-CM | POA: Diagnosis present

## 2016-09-14 DIAGNOSIS — E109 Type 1 diabetes mellitus without complications: Secondary | ICD-10-CM | POA: Diagnosis not present

## 2016-09-14 DIAGNOSIS — E119 Type 2 diabetes mellitus without complications: Secondary | ICD-10-CM | POA: Diagnosis present

## 2016-09-14 DIAGNOSIS — I10 Essential (primary) hypertension: Secondary | ICD-10-CM | POA: Diagnosis present

## 2016-09-14 DIAGNOSIS — F331 Major depressive disorder, recurrent, moderate: Secondary | ICD-10-CM | POA: Diagnosis not present

## 2016-09-14 DIAGNOSIS — R456 Violent behavior: Secondary | ICD-10-CM | POA: Diagnosis not present

## 2016-09-14 DIAGNOSIS — B1920 Unspecified viral hepatitis C without hepatic coma: Secondary | ICD-10-CM | POA: Diagnosis present

## 2016-09-14 DIAGNOSIS — R45850 Homicidal ideations: Secondary | ICD-10-CM | POA: Diagnosis present

## 2016-09-14 DIAGNOSIS — R45851 Suicidal ideations: Secondary | ICD-10-CM | POA: Diagnosis present

## 2016-09-14 DIAGNOSIS — G4089 Other seizures: Secondary | ICD-10-CM | POA: Diagnosis not present

## 2016-09-14 DIAGNOSIS — F603 Borderline personality disorder: Secondary | ICD-10-CM | POA: Diagnosis present

## 2016-09-14 DIAGNOSIS — Z915 Personal history of self-harm: Secondary | ICD-10-CM | POA: Diagnosis not present

## 2016-09-14 DIAGNOSIS — Z91419 Personal history of unspecified adult abuse: Secondary | ICD-10-CM | POA: Diagnosis not present

## 2016-09-14 DIAGNOSIS — F259 Schizoaffective disorder, unspecified: Secondary | ICD-10-CM | POA: Diagnosis not present

## 2016-09-14 DIAGNOSIS — E039 Hypothyroidism, unspecified: Secondary | ICD-10-CM | POA: Diagnosis present

## 2016-09-14 DIAGNOSIS — Z62811 Personal history of psychological abuse in childhood: Secondary | ICD-10-CM | POA: Diagnosis present

## 2016-09-14 DIAGNOSIS — Z87891 Personal history of nicotine dependence: Secondary | ICD-10-CM | POA: Diagnosis not present

## 2016-09-14 DIAGNOSIS — F819 Developmental disorder of scholastic skills, unspecified: Secondary | ICD-10-CM | POA: Diagnosis present

## 2016-09-15 DIAGNOSIS — G4089 Other seizures: Secondary | ICD-10-CM | POA: Diagnosis not present

## 2016-09-15 DIAGNOSIS — F331 Major depressive disorder, recurrent, moderate: Secondary | ICD-10-CM | POA: Diagnosis not present

## 2016-09-15 DIAGNOSIS — E109 Type 1 diabetes mellitus without complications: Secondary | ICD-10-CM | POA: Diagnosis not present

## 2016-09-29 DIAGNOSIS — F250 Schizoaffective disorder, bipolar type: Secondary | ICD-10-CM | POA: Diagnosis not present

## 2016-10-07 DIAGNOSIS — Z794 Long term (current) use of insulin: Secondary | ICD-10-CM | POA: Diagnosis not present

## 2016-10-07 DIAGNOSIS — I8511 Secondary esophageal varices with bleeding: Secondary | ICD-10-CM | POA: Diagnosis present

## 2016-10-07 DIAGNOSIS — I10 Essential (primary) hypertension: Secondary | ICD-10-CM | POA: Diagnosis not present

## 2016-10-07 DIAGNOSIS — K7290 Hepatic failure, unspecified without coma: Secondary | ICD-10-CM | POA: Diagnosis not present

## 2016-10-07 DIAGNOSIS — Z79899 Other long term (current) drug therapy: Secondary | ICD-10-CM | POA: Diagnosis not present

## 2016-10-07 DIAGNOSIS — D62 Acute posthemorrhagic anemia: Secondary | ICD-10-CM | POA: Diagnosis present

## 2016-10-07 DIAGNOSIS — F329 Major depressive disorder, single episode, unspecified: Secondary | ICD-10-CM | POA: Diagnosis not present

## 2016-10-07 DIAGNOSIS — Z888 Allergy status to other drugs, medicaments and biological substances status: Secondary | ICD-10-CM | POA: Diagnosis not present

## 2016-10-07 DIAGNOSIS — K3189 Other diseases of stomach and duodenum: Secondary | ICD-10-CM | POA: Diagnosis not present

## 2016-10-07 DIAGNOSIS — B1920 Unspecified viral hepatitis C without hepatic coma: Secondary | ICD-10-CM | POA: Diagnosis not present

## 2016-10-07 DIAGNOSIS — E669 Obesity, unspecified: Secondary | ICD-10-CM | POA: Diagnosis not present

## 2016-10-07 DIAGNOSIS — K766 Portal hypertension: Secondary | ICD-10-CM | POA: Diagnosis present

## 2016-10-07 DIAGNOSIS — F209 Schizophrenia, unspecified: Secondary | ICD-10-CM | POA: Diagnosis not present

## 2016-10-07 DIAGNOSIS — K219 Gastro-esophageal reflux disease without esophagitis: Secondary | ICD-10-CM | POA: Diagnosis not present

## 2016-10-07 DIAGNOSIS — E119 Type 2 diabetes mellitus without complications: Secondary | ICD-10-CM | POA: Diagnosis not present

## 2016-10-07 DIAGNOSIS — R079 Chest pain, unspecified: Secondary | ICD-10-CM | POA: Diagnosis not present

## 2016-10-07 DIAGNOSIS — F332 Major depressive disorder, recurrent severe without psychotic features: Secondary | ICD-10-CM | POA: Diagnosis present

## 2016-10-07 DIAGNOSIS — Z23 Encounter for immunization: Secondary | ICD-10-CM | POA: Diagnosis not present

## 2016-10-07 DIAGNOSIS — R161 Splenomegaly, not elsewhere classified: Secondary | ICD-10-CM | POA: Diagnosis not present

## 2016-10-07 DIAGNOSIS — K922 Gastrointestinal hemorrhage, unspecified: Secondary | ICD-10-CM | POA: Diagnosis not present

## 2016-10-07 DIAGNOSIS — E039 Hypothyroidism, unspecified: Secondary | ICD-10-CM | POA: Diagnosis not present

## 2016-10-07 DIAGNOSIS — F1411 Cocaine abuse, in remission: Secondary | ICD-10-CM | POA: Diagnosis not present

## 2016-10-07 DIAGNOSIS — I8501 Esophageal varices with bleeding: Secondary | ICD-10-CM | POA: Diagnosis present

## 2016-10-07 DIAGNOSIS — I8500 Esophageal varices without bleeding: Secondary | ICD-10-CM | POA: Diagnosis not present

## 2016-10-07 DIAGNOSIS — Z6837 Body mass index (BMI) 37.0-37.9, adult: Secondary | ICD-10-CM | POA: Diagnosis not present

## 2016-10-07 DIAGNOSIS — D649 Anemia, unspecified: Secondary | ICD-10-CM | POA: Diagnosis not present

## 2016-10-07 DIAGNOSIS — K7460 Unspecified cirrhosis of liver: Secondary | ICD-10-CM | POA: Diagnosis not present

## 2016-10-08 DIAGNOSIS — I8500 Esophageal varices without bleeding: Secondary | ICD-10-CM | POA: Diagnosis not present

## 2016-10-08 DIAGNOSIS — K3189 Other diseases of stomach and duodenum: Secondary | ICD-10-CM | POA: Diagnosis not present

## 2016-10-08 DIAGNOSIS — K7460 Unspecified cirrhosis of liver: Secondary | ICD-10-CM | POA: Diagnosis not present

## 2016-11-10 DIAGNOSIS — F603 Borderline personality disorder: Secondary | ICD-10-CM | POA: Diagnosis not present

## 2016-11-10 DIAGNOSIS — F250 Schizoaffective disorder, bipolar type: Secondary | ICD-10-CM | POA: Diagnosis not present

## 2016-11-17 DIAGNOSIS — F603 Borderline personality disorder: Secondary | ICD-10-CM | POA: Diagnosis not present

## 2016-11-18 DIAGNOSIS — Z1339 Encounter for screening examination for other mental health and behavioral disorders: Secondary | ICD-10-CM | POA: Diagnosis not present

## 2016-11-18 DIAGNOSIS — Z0000 Encounter for general adult medical examination without abnormal findings: Secondary | ICD-10-CM | POA: Diagnosis not present

## 2016-11-18 DIAGNOSIS — E1165 Type 2 diabetes mellitus with hyperglycemia: Secondary | ICD-10-CM | POA: Diagnosis not present

## 2016-11-18 DIAGNOSIS — E1140 Type 2 diabetes mellitus with diabetic neuropathy, unspecified: Secondary | ICD-10-CM | POA: Diagnosis not present

## 2016-12-01 DIAGNOSIS — F250 Schizoaffective disorder, bipolar type: Secondary | ICD-10-CM | POA: Diagnosis not present

## 2016-12-02 DIAGNOSIS — K31819 Angiodysplasia of stomach and duodenum without bleeding: Secondary | ICD-10-CM | POA: Diagnosis not present

## 2016-12-02 DIAGNOSIS — K766 Portal hypertension: Secondary | ICD-10-CM | POA: Diagnosis not present

## 2016-12-02 DIAGNOSIS — K7460 Unspecified cirrhosis of liver: Secondary | ICD-10-CM | POA: Diagnosis not present

## 2016-12-02 DIAGNOSIS — I8500 Esophageal varices without bleeding: Secondary | ICD-10-CM | POA: Diagnosis not present

## 2016-12-02 DIAGNOSIS — E039 Hypothyroidism, unspecified: Secondary | ICD-10-CM | POA: Diagnosis not present

## 2016-12-02 DIAGNOSIS — I8510 Secondary esophageal varices without bleeding: Secondary | ICD-10-CM | POA: Diagnosis not present

## 2016-12-02 DIAGNOSIS — K219 Gastro-esophageal reflux disease without esophagitis: Secondary | ICD-10-CM | POA: Diagnosis not present

## 2016-12-02 DIAGNOSIS — B1920 Unspecified viral hepatitis C without hepatic coma: Secondary | ICD-10-CM | POA: Diagnosis not present

## 2016-12-02 DIAGNOSIS — K3189 Other diseases of stomach and duodenum: Secondary | ICD-10-CM | POA: Diagnosis not present

## 2016-12-02 DIAGNOSIS — E119 Type 2 diabetes mellitus without complications: Secondary | ICD-10-CM | POA: Diagnosis not present

## 2016-12-08 DIAGNOSIS — F603 Borderline personality disorder: Secondary | ICD-10-CM | POA: Diagnosis not present

## 2017-01-01 DIAGNOSIS — E119 Type 2 diabetes mellitus without complications: Secondary | ICD-10-CM | POA: Diagnosis not present

## 2017-01-01 DIAGNOSIS — E039 Hypothyroidism, unspecified: Secondary | ICD-10-CM | POA: Diagnosis not present

## 2017-01-01 DIAGNOSIS — I10 Essential (primary) hypertension: Secondary | ICD-10-CM | POA: Diagnosis not present

## 2017-01-01 DIAGNOSIS — K219 Gastro-esophageal reflux disease without esophagitis: Secondary | ICD-10-CM | POA: Diagnosis not present

## 2017-01-01 DIAGNOSIS — R45851 Suicidal ideations: Secondary | ICD-10-CM | POA: Diagnosis not present

## 2017-01-01 DIAGNOSIS — F319 Bipolar disorder, unspecified: Secondary | ICD-10-CM | POA: Diagnosis not present

## 2017-01-02 ENCOUNTER — Other Ambulatory Visit: Payer: Self-pay

## 2017-01-02 ENCOUNTER — Inpatient Hospital Stay (HOSPITAL_COMMUNITY)
Admission: AD | Admit: 2017-01-02 | Discharge: 2017-01-15 | DRG: 885 | Disposition: A | Discharge status: Home / Self Care | Payer: Medicare Other | Attending: Psychiatry | Admitting: Psychiatry

## 2017-01-02 ENCOUNTER — Encounter (HOSPITAL_COMMUNITY): Payer: Self-pay | Admitting: Emergency Medicine

## 2017-01-02 DIAGNOSIS — F419 Anxiety disorder, unspecified: Secondary | ICD-10-CM | POA: Diagnosis present

## 2017-01-02 DIAGNOSIS — Z79899 Other long term (current) drug therapy: Secondary | ICD-10-CM

## 2017-01-02 DIAGNOSIS — G4700 Insomnia, unspecified: Secondary | ICD-10-CM | POA: Diagnosis not present

## 2017-01-02 DIAGNOSIS — Z794 Long term (current) use of insulin: Secondary | ICD-10-CM

## 2017-01-02 DIAGNOSIS — F29 Unspecified psychosis not due to a substance or known physiological condition: Secondary | ICD-10-CM | POA: Diagnosis not present

## 2017-01-02 DIAGNOSIS — Z87891 Personal history of nicotine dependence: Secondary | ICD-10-CM

## 2017-01-02 DIAGNOSIS — E039 Hypothyroidism, unspecified: Secondary | ICD-10-CM | POA: Diagnosis not present

## 2017-01-02 DIAGNOSIS — Z915 Personal history of self-harm: Secondary | ICD-10-CM

## 2017-01-02 DIAGNOSIS — F319 Bipolar disorder, unspecified: Secondary | ICD-10-CM | POA: Diagnosis present

## 2017-01-02 DIAGNOSIS — F1210 Cannabis abuse, uncomplicated: Secondary | ICD-10-CM | POA: Diagnosis not present

## 2017-01-02 DIAGNOSIS — K219 Gastro-esophageal reflux disease without esophagitis: Secondary | ICD-10-CM | POA: Diagnosis not present

## 2017-01-02 DIAGNOSIS — R45851 Suicidal ideations: Secondary | ICD-10-CM | POA: Diagnosis not present

## 2017-01-02 DIAGNOSIS — I10 Essential (primary) hypertension: Secondary | ICD-10-CM | POA: Diagnosis present

## 2017-01-02 DIAGNOSIS — Z888 Allergy status to other drugs, medicaments and biological substances status: Secondary | ICD-10-CM

## 2017-01-02 DIAGNOSIS — Z62820 Parent-biological child conflict: Secondary | ICD-10-CM | POA: Diagnosis not present

## 2017-01-02 DIAGNOSIS — R441 Visual hallucinations: Secondary | ICD-10-CM | POA: Diagnosis not present

## 2017-01-02 DIAGNOSIS — R440 Auditory hallucinations: Secondary | ICD-10-CM | POA: Diagnosis not present

## 2017-01-02 DIAGNOSIS — R451 Restlessness and agitation: Secondary | ICD-10-CM | POA: Diagnosis not present

## 2017-01-02 DIAGNOSIS — Z818 Family history of other mental and behavioral disorders: Secondary | ICD-10-CM | POA: Diagnosis not present

## 2017-01-02 DIAGNOSIS — F39 Unspecified mood [affective] disorder: Secondary | ICD-10-CM | POA: Diagnosis not present

## 2017-01-02 DIAGNOSIS — F209 Schizophrenia, unspecified: Principal | ICD-10-CM | POA: Diagnosis present

## 2017-01-02 DIAGNOSIS — F200 Paranoid schizophrenia: Secondary | ICD-10-CM | POA: Diagnosis not present

## 2017-01-02 DIAGNOSIS — R450 Nervousness: Secondary | ICD-10-CM | POA: Diagnosis not present

## 2017-01-02 DIAGNOSIS — E119 Type 2 diabetes mellitus without complications: Secondary | ICD-10-CM | POA: Diagnosis present

## 2017-01-02 DIAGNOSIS — F1410 Cocaine abuse, uncomplicated: Secondary | ICD-10-CM | POA: Diagnosis not present

## 2017-01-02 HISTORY — DX: Depression, unspecified: F32.A

## 2017-01-02 HISTORY — DX: Major depressive disorder, single episode, unspecified: F32.9

## 2017-01-02 HISTORY — DX: Essential (primary) hypertension: I10

## 2017-01-02 HISTORY — DX: Anxiety disorder, unspecified: F41.9

## 2017-01-02 HISTORY — DX: Gastro-esophageal reflux disease without esophagitis: K21.9

## 2017-01-02 HISTORY — DX: Type 2 diabetes mellitus without complications: E11.9

## 2017-01-02 LAB — GLUCOSE, CAPILLARY: GLUCOSE-CAPILLARY: 121 mg/dL — AB (ref 65–99)

## 2017-01-02 MED ORDER — ALUM & MAG HYDROXIDE-SIMETH 200-200-20 MG/5ML PO SUSP: 30.0000 mL | ORAL | Status: DC | PRN

## 2017-01-02 MED ORDER — MAGNESIUM HYDROXIDE 400 MG/5ML PO SUSP: 30.0000 mL | Freq: Every day | ORAL | Status: DC | PRN

## 2017-01-02 MED ORDER — METFORMIN HCL 500 MG PO TABS
500.0000 mg | ORAL_TABLET | Freq: Three times a day (TID) | ORAL | Status: DC
  Filled 2017-01-02: qty 1

## 2017-01-02 MED ORDER — ACETAMINOPHEN 325 MG PO TABS
650.0000 mg | ORAL_TABLET | Freq: Four times a day (QID) | ORAL | Status: DC | PRN
  Administered 2017-01-03 – 2017-01-15 (×27): 650 mg via ORAL
  Filled 2017-01-02 (×27): qty 2

## 2017-01-02 MED ORDER — INSULIN DETEMIR 100 UNIT/ML ~~LOC~~ SOLN
30.0000 [IU] | Freq: Two times a day (BID) | SUBCUTANEOUS | Status: DC
  Administered 2017-01-03 – 2017-01-04 (×2): 30 [IU] via SUBCUTANEOUS
  Administered 2017-01-04: 3 [IU] via SUBCUTANEOUS
  Administered 2017-01-05 – 2017-01-15 (×21): 30 [IU] via SUBCUTANEOUS

## 2017-01-02 MED ORDER — HYDROXYZINE HCL 25 MG PO TABS
25.0000 mg | ORAL_TABLET | Freq: Three times a day (TID) | ORAL | Status: DC | PRN
  Administered 2017-01-02 – 2017-01-14 (×9): 25 mg via ORAL
  Filled 2017-01-02 (×7): qty 1

## 2017-01-02 MED ORDER — TRAZODONE HCL 50 MG PO TABS
50.0000 mg | ORAL_TABLET | Freq: Every evening | ORAL | Status: DC | PRN
  Administered 2017-01-02 – 2017-01-08 (×6): 50 mg via ORAL
  Filled 2017-01-02 (×3): qty 1

## 2017-01-02 MED ORDER — INSULIN ASPART 100 UNIT/ML ~~LOC~~ SOLN: 12.0000 [IU] | Freq: Three times a day (TID) | SUBCUTANEOUS | Status: DC

## 2017-01-02 NOTE — Progress Notes (Signed)
Patient ID: Jessica [name], female   DOB: 06-14-1965, 51 y.o.   MRN: [id number] Per State regulations 482.30 this chart was reviewed for medical necessity with respect to the patient's admission/duration of stay.    Next review date: 01/06/17  Debarah Crape, BSN, RN-BC  Case Manager
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
## 2017-01-02 NOTE — Tx Team (Signed)
Initial Treatment Plan 01/02/2017 10:24 PM Jessica [name] GTX:[id number]    PATIENT STRESSORS: Marital or family conflict Other: Auditory hallucinations   PATIENT STRENGTHS: Ability for insight Average or above average intelligence Communication skills General fund of knowledge Supportive family/friends   PATIENT IDENTIFIED PROBLEMS: Depression Suicidal thoughts Auditory hallucinations "Think I need to be on something else"                     DISCHARGE CRITERIA:  Ability to meet basic life and health needs Improved stabilization in mood, thinking, and/or behavior Verbal commitment to aftercare and medication compliance  PRELIMINARY DISCHARGE PLAN: Attend aftercare/continuing care group Return to previous living arrangement  PATIENT/FAMILY INVOLVEMENT: This treatment plan has been presented to and reviewed with the patient, Jessica [name], and/or family member, .  The patient and family have been given the opportunity to ask questions and make suggestions.  [place], [place], [place] 01/02/2017, 10:24 PM
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
## 2017-01-03 DIAGNOSIS — R441 Visual hallucinations: Secondary | ICD-10-CM

## 2017-01-03 DIAGNOSIS — R450 Nervousness: Secondary | ICD-10-CM

## 2017-01-03 DIAGNOSIS — R440 Auditory hallucinations: Secondary | ICD-10-CM

## 2017-01-03 DIAGNOSIS — G4700 Insomnia, unspecified: Secondary | ICD-10-CM

## 2017-01-03 DIAGNOSIS — Z818 Family history of other mental and behavioral disorders: Secondary | ICD-10-CM

## 2017-01-03 DIAGNOSIS — F419 Anxiety disorder, unspecified: Secondary | ICD-10-CM

## 2017-01-03 DIAGNOSIS — F209 Schizophrenia, unspecified: Principal | ICD-10-CM

## 2017-01-03 LAB — GLUCOSE, CAPILLARY
GLUCOSE-CAPILLARY: 184 mg/dL — AB (ref 65–99)
GLUCOSE-CAPILLARY: 183 mg/dL — AB (ref 65–99)
Glucose-Capillary: 158 mg/dL — ABNORMAL HIGH (ref 65–99)
Glucose-Capillary: 232 mg/dL — ABNORMAL HIGH (ref 65–99)

## 2017-01-03 LAB — LIPID PANEL
CHOL/HDL RATIO: 5.2 ratio
Cholesterol: 223 mg/dL — ABNORMAL HIGH (ref 0–200)
HDL: 43 mg/dL (ref >40)
LDL CALC: 134 mg/dL — AB (ref 0–99)
TRIGLYCERIDES: 228 mg/dL — AB (ref <150)
VLDL: 46 mg/dL — ABNORMAL HIGH (ref 0–40)

## 2017-01-03 LAB — TSH: TSH: 1.969 u[IU]/mL (ref 0.350–4.500)

## 2017-01-03 MED ORDER — VALACYCLOVIR HCL 500 MG PO TABS
500.0000 mg | ORAL_TABLET | Freq: Every day | ORAL | Status: DC
  Administered 2017-01-03 – 2017-01-15 (×13): 500 mg via ORAL
  Filled 2017-01-03 (×14): qty 1

## 2017-01-03 MED ORDER — INSULIN ASPART 100 UNIT/ML ~~LOC~~ SOLN: 0.0000 [IU] | Freq: Every day | SUBCUTANEOUS | Status: DC

## 2017-01-03 MED ORDER — LAMOTRIGINE 25 MG PO TABS
50.0000 mg | ORAL_TABLET | Freq: Every day | ORAL | Status: DC
  Administered 2017-01-03: 50 mg via ORAL
  Filled 2017-01-03 (×3): qty 2

## 2017-01-03 MED ORDER — GABAPENTIN 600 MG PO TABS
600.0000 mg | ORAL_TABLET | Freq: Four times a day (QID) | ORAL | Status: DC
  Filled 2017-01-03: qty 1

## 2017-01-03 MED ORDER — LEVOCETIRIZINE DIHYDROCHLORIDE 5 MG PO TABS
5.0000 mg | ORAL_TABLET | Freq: Every day | ORAL | Status: DC
  Filled 2017-01-03 (×4): qty 1

## 2017-01-03 MED ORDER — BENZTROPINE MESYLATE 1 MG PO TABS
1.0000 mg | ORAL_TABLET | Freq: Two times a day (BID) | ORAL | Status: DC
  Administered 2017-01-03 – 2017-01-15 (×25): 1 mg via ORAL
  Filled 2017-01-03 (×29): qty 1

## 2017-01-03 MED ORDER — LISINOPRIL 10 MG PO TABS
10.0000 mg | ORAL_TABLET | Freq: Every day | ORAL | Status: DC
  Administered 2017-01-03 – 2017-01-15 (×13): 10 mg via ORAL
  Filled 2017-01-03 (×11): qty 1
  Filled 2017-01-03: qty 2
  Filled 2017-01-03 (×3): qty 1

## 2017-01-03 MED ORDER — PANTOPRAZOLE SODIUM 40 MG PO TBEC
40.0000 mg | DELAYED_RELEASE_TABLET | Freq: Every day | ORAL | Status: DC
  Administered 2017-01-03 – 2017-01-15 (×13): 40 mg via ORAL
  Filled 2017-01-03 (×14): qty 1

## 2017-01-03 MED ORDER — LURASIDONE HCL 80 MG PO TABS
80.0000 mg | ORAL_TABLET | Freq: Two times a day (BID) | ORAL | Status: DC
  Administered 2017-01-03 – 2017-01-15 (×25): 80 mg via ORAL
  Filled 2017-01-03 (×5): qty 1
  Filled 2017-01-03 (×2): qty 2
  Filled 2017-01-03 (×17): qty 1
  Filled 2017-01-03 (×2): qty 2
  Filled 2017-01-03 (×5): qty 1

## 2017-01-03 MED ORDER — INSULIN ASPART 100 UNIT/ML ~~LOC~~ SOLN
0.0000 [IU] | Freq: Three times a day (TID) | SUBCUTANEOUS | Status: DC
  Administered 2017-01-04: 2 [IU] via SUBCUTANEOUS
  Administered 2017-01-06: 3 [IU] via SUBCUTANEOUS
  Administered 2017-01-12: 2 [IU] via SUBCUTANEOUS

## 2017-01-03 MED ORDER — LAMOTRIGINE 25 MG PO TABS
25.0000 mg | ORAL_TABLET | Freq: Two times a day (BID) | ORAL | Status: DC
  Administered 2017-01-03 – 2017-01-04 (×4): 25 mg via ORAL
  Filled 2017-01-03 (×7): qty 1

## 2017-01-03 MED ORDER — DULOXETINE HCL 60 MG PO CPEP
60.0000 mg | ORAL_CAPSULE | Freq: Every day | ORAL | Status: DC
  Administered 2017-01-03 – 2017-01-15 (×13): 60 mg via ORAL
  Filled 2017-01-03 (×14): qty 1

## 2017-01-03 MED ORDER — CARVEDILOL 6.25 MG PO TABS
6.2500 mg | ORAL_TABLET | Freq: Two times a day (BID) | ORAL | Status: DC
  Administered 2017-01-03 – 2017-01-15 (×25): 6.25 mg via ORAL
  Filled 2017-01-03 (×27): qty 1

## 2017-01-03 MED ORDER — CALCIUM CARBONATE-VITAMIN D 500-200 MG-UNIT PO TABS
1.0000 | ORAL_TABLET | Freq: Two times a day (BID) | ORAL | Status: DC
  Administered 2017-01-03 – 2017-01-15 (×25): 1 via ORAL
  Filled 2017-01-03 (×28): qty 1

## 2017-01-03 MED ORDER — CYCLOSPORINE 0.05 % OP EMUL
1.0000 [drp] | Freq: Two times a day (BID) | OPHTHALMIC | Status: DC
  Administered 2017-01-03 – 2017-01-15 (×25): 1 [drp] via OPHTHALMIC
  Filled 2017-01-03 (×27): qty 1

## 2017-01-03 MED ORDER — GABAPENTIN 600 MG PO TABS
600.0000 mg | ORAL_TABLET | Freq: Three times a day (TID) | ORAL | Status: DC
  Administered 2017-01-03 – 2017-01-15 (×38): 600 mg via ORAL
  Filled 2017-01-03 (×42): qty 1

## 2017-01-03 MED ORDER — METFORMIN HCL 500 MG PO TABS
500.0000 mg | ORAL_TABLET | Freq: Two times a day (BID) | ORAL | Status: DC
  Administered 2017-01-03 – 2017-01-15 (×25): 500 mg via ORAL
  Filled 2017-01-03 (×28): qty 1

## 2017-01-03 MED ORDER — FERROUS SULFATE 325 (65 FE) MG PO TABS
325.0000 mg | ORAL_TABLET | Freq: Two times a day (BID) | ORAL | Status: DC
  Administered 2017-01-03 – 2017-01-15 (×25): 325 mg via ORAL
  Filled 2017-01-03 (×27): qty 1

## 2017-01-03 MED ORDER — LEVOTHYROXINE SODIUM 150 MCG PO TABS
150.0000 ug | ORAL_TABLET | Freq: Every day | ORAL | Status: DC
  Administered 2017-01-03 – 2017-01-15 (×13): 150 ug via ORAL
  Filled 2017-01-03 (×14): qty 1
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
## 2017-01-03 NOTE — BHH Group Notes (Signed)
[facility] LCSW Group Therapy Note  Date/Time:  01/03/2017  11:00AM-12:00PM  Type of Therapy and Topic:  Group Therapy:  Music and Mood  Participation Level:  Active   Description of Group: In this process group, members listened to a variety of genres of music and identified that different types of music evoke different responses.  Patients were encouraged to identify music that was soothing for them and music that was energizing for them.  Patients discussed how this knowledge can help with wellness and recovery in various ways including managing depression and anxiety as well as encouraging healthy sleep habits.    Therapeutic Goals: 1. Patients will explore the impact of different varieties of music on mood 2. Patients will verbalize the thoughts they have when listening to different types of music 3. Patients will identify music that is soothing to them as well as music that is energizing to them 4. Patients will discuss how to use this knowledge to assist in maintaining wellness and recovery 5. Patients will explore the use of music as a coping skill  Summary of Patient Progress:  At the beginning of group, patient expressed that she felt good.  She danced almost continually, saying at the end of group that she felt relaxed.  Therapeutic Modalities: Solution Focused Brief Therapy Motivational Interviewing Activity   Selmer Dominion, LCSW 01/03/2017 12:32 PM

## 2017-01-03 NOTE — Plan of Care (Signed)
D: Jessica [name] has been calm and cooperative today. She denied SI, HI, and AVH. She has been interactive with staff and peers, engaging in conversations when possible. She has been concerned about her mother, who she has been admitted to [facility] for cellulitis. Jessica [name] is also concerned about how her dog is doing while she and her mother are hospitalized. She has complained of arthritic pain that has been relieved somewhat with 650 mg acetaminophen. Her insulin was held this a.m while Ricky Ala NP reviewed her medications.   A: Meds given as ordered, including the PRN acetaminophen mentioned above. CBG 183 at 1205. Q15 safety checks maintained. Support/encouragement offered.  R: Pt remains free from harm and continues with treatment. Will continue to monitor for needs/safety.

## 2017-01-03 NOTE — Progress Notes (Signed)
CBG 184 at present time. Provider notified. Lindon Romp NP orders novolog to be administered after breakfast. Will continue to monitor.
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
## 2017-01-04 DIAGNOSIS — F1210 Cannabis abuse, uncomplicated: Secondary | ICD-10-CM

## 2017-01-04 DIAGNOSIS — Z87891 Personal history of nicotine dependence: Secondary | ICD-10-CM

## 2017-01-04 DIAGNOSIS — E119 Type 2 diabetes mellitus without complications: Secondary | ICD-10-CM

## 2017-01-04 DIAGNOSIS — F1410 Cocaine abuse, uncomplicated: Secondary | ICD-10-CM

## 2017-01-04 DIAGNOSIS — R451 Restlessness and agitation: Secondary | ICD-10-CM

## 2017-01-04 DIAGNOSIS — I10 Essential (primary) hypertension: Secondary | ICD-10-CM

## 2017-01-04 DIAGNOSIS — F39 Unspecified mood [affective] disorder: Secondary | ICD-10-CM

## 2017-01-04 DIAGNOSIS — F29 Unspecified psychosis not due to a substance or known physiological condition: Secondary | ICD-10-CM

## 2017-01-04 LAB — GLUCOSE, CAPILLARY
GLUCOSE-CAPILLARY: 114 mg/dL — AB (ref 65–99)
GLUCOSE-CAPILLARY: 197 mg/dL — AB (ref 65–99)
Glucose-Capillary: 109 mg/dL — ABNORMAL HIGH (ref 65–99)
Glucose-Capillary: 205 mg/dL — ABNORMAL HIGH (ref 65–99)

## 2017-01-04 LAB — HEMOGLOBIN A1C
Hgb A1c MFr Bld: 6.3 % — ABNORMAL HIGH (ref 4.8–5.6)
MEAN PLASMA GLUCOSE: 134 mg/dL

## 2017-01-04 MED ORDER — CETIRIZINE HCL 10 MG PO TABS
10.0000 mg | ORAL_TABLET | Freq: Every day | ORAL | Status: DC
  Administered 2017-01-04 – 2017-01-06 (×3): 10 mg via ORAL
  Filled 2017-01-04 (×5): qty 1

## 2017-01-04 MED ORDER — LAMOTRIGINE 100 MG PO TABS
100.0000 mg | ORAL_TABLET | Freq: Every day | ORAL | Status: DC
  Administered 2017-01-04 – 2017-01-14 (×11): 100 mg via ORAL
  Filled 2017-01-04 (×15): qty 1

## 2017-01-04 NOTE — Plan of Care (Signed)
D: Jessica [name] has been pleasant and visible in the milieu. She denied SI, HI, and AVH. She reported no notable medication side effects. On her self inventory form, she reported fair sleep, fair appetite, normal energy level, and good concentration. She rated her depression 7/10, hopelessness 5/10, and anxiety 8/10. She has presented no behavioral management issues, as she has been calm and friendly. She has continued to wonder aloud whether she should return to her mom's or go to an ALF.   A: Meds given as ordered. Q15 safety checks maintained. Support/encouragement offered.  R: Pt remains free from harm and continues with treatment. Will continue to monitor for needs/safety.

## 2017-01-04 NOTE — Progress Notes (Signed)
D: When asked about her day pt stated, "I spoke to the doctor and social walker. They are looking for asst living in [place]". Stated, "I don't want to be too far from family because they might not be able to come visit". Pt has no other questions or concerns.    A:  Support and encouragement was offered. 15 min checks continued for safety.  R: Pt remains safe.

## 2017-01-04 NOTE — BHH Group Notes (Signed)
LCSW Group Therapy Note   01/04/2017 1:15pm   Type of Therapy and Topic:  Group Therapy:  Overcoming Obstacles   Participation Level:  Minimal   Description of Group:    In this group patients will be encouraged to explore what they see as obstacles to their own wellness and recovery. They will be guided to discuss their thoughts, feelings, and behaviors related to these obstacles. The group will process together ways to cope with barriers, with attention given to specific choices patients can make. Each patient will be challenged to identify changes they are motivated to make in order to overcome their obstacles. This group will be process-oriented, with patients participating in exploration of their own experiences as well as giving and receiving support and challenge from other group members.   Therapeutic Goals: 1. Patient will identify personal and current obstacles as they relate to admission. 2. Patient will identify barriers that currently interfere with their wellness or overcoming obstacles.  3. Patient will identify feelings, thought process and behaviors related to these barriers. 4. Patient will identify two changes they are willing to make to overcome these obstacles:      Summary of Patient Progress   Stayesd the entire time, engaged throughout.  Minimal participation.  Concrete in her thinking.  "I decided that I need to go to a [facility], but I think that will be a good thing."   Therapeutic Modalities:   Cognitive Behavioral Therapy Solution Focused Therapy Motivational Interviewing Relapse Prevention Mena, LCSW 01/04/2017 4:50 PM

## 2017-01-04 NOTE — Tx Team (Signed)
Interdisciplinary Treatment and Diagnostic Plan Update  01/04/2017 Time of Session: 8:23 AM  Jessica [name] MRN: [id number]  Principal Diagnosis: Schizophrenia [facility])  Secondary Diagnoses: Principal Problem:   Schizophrenia ([place])   Current Medications:  Current Facility-Administered Medications  Medication Dose Route Frequency Provider Last Rate Last Dose  . acetaminophen (TYLENOL) tablet 650 mg  650 mg Oral Q6H PRN Okonkwo, Justina A, NP   650 mg at 01/03/17 1032  . alum & mag hydroxide-simeth (MAALOX/MYLANTA) 200-200-20 MG/5ML suspension 30 mL  30 mL Oral Q4H PRN Okonkwo, Justina A, NP      . benztropine (COGENTIN) tablet 1 mg  1 mg Oral BID Lindon Romp A, NP   1 mg at 01/04/17 0750  . calcium-vitamin D (OSCAL WITH D) 500-200 MG-UNIT per tablet 1 tablet  1 tablet Oral BID Lindon Romp A, NP   1 tablet at 01/04/17 0749  . carvedilol (COREG) tablet 6.25 mg  6.25 mg Oral BID Lindon Romp A, NP   6.25 mg at 01/04/17 0749  . cycloSPORINE (RESTASIS) 0.05 % ophthalmic emulsion 1 drop  1 drop Both Eyes BID Lindon Romp A, NP   1 drop at 01/04/17 0751  . DULoxetine (CYMBALTA) DR capsule 60 mg  60 mg Oral Daily Lindon Romp A, NP   60 mg at 01/04/17 0749  . ferrous sulfate tablet 325 mg  325 mg Oral BID Lindon Romp A, NP   325 mg at 01/04/17 0750  . gabapentin (NEURONTIN) tablet 600 mg  600 mg Oral TID Lindon Romp A, NP   600 mg at 01/04/17 0751  . hydrOXYzine (ATARAX/VISTARIL) tablet 25 mg  25 mg Oral TID PRN Hughie Closs A, NP   25 mg at 01/03/17 2129  . insulin aspart (novoLOG) injection 0-5 Units  0-5 Units Subcutaneous TID WC Lewis, Marcy Panning, NP      . insulin detemir (LEVEMIR) injection 30 Units  30 Units Subcutaneous Q12H Lindon Romp A, NP   30 Units at 01/04/17 0759  . lamoTRIgine (LAMICTAL) tablet 25 mg  25 mg Oral BID Lindon Romp A, NP   25 mg at 01/04/17 0751  . lamoTRIgine (LAMICTAL) tablet 50 mg  50 mg Oral QHS Lindon Romp A, NP   50 mg at 01/03/17 2128  . levocetirizine  (XYZAL) tablet 5 mg  5 mg Oral Daily Lindon Romp A, NP      . levothyroxine (SYNTHROID, LEVOTHROID) tablet 150 mcg  150 mcg Oral Daily Lindon Romp A, NP   150 mcg at 01/04/17 0750  . lisinopril (PRINIVIL,ZESTRIL) tablet 10 mg  10 mg Oral Daily Lindon Romp A, NP   10 mg at 01/04/17 0751  . lurasidone (LATUDA) tablet 80 mg  80 mg Oral BID PC Lindon Romp A, NP   80 mg at 01/04/17 0750  . magnesium hydroxide (MILK OF MAGNESIA) suspension 30 mL  30 mL Oral Daily PRN Okonkwo, Justina A, NP      . metFORMIN (GLUCOPHAGE) tablet 500 mg  500 mg Oral BID Lindon Romp A, NP   500 mg at 01/04/17 0750  . pantoprazole (PROTONIX) EC tablet 40 mg  40 mg Oral Daily Lindon Romp A, NP   40 mg at 01/04/17 0751  . traZODone (DESYREL) tablet 50 mg  50 mg Oral QHS PRN Lu Duffel, Justina A, NP   50 mg at 01/03/17 2129  . valACYclovir (VALTREX) tablet 500 mg  500 mg Oral Daily Lindon Romp A, NP   500 mg at 01/04/17 0750  PTA Medications: Medications Prior to Admission  Medication Sig Dispense Refill Last Dose  . acetaminophen (TYLENOL) 325 MG tablet Take 650 mg by mouth every 6 (six) hours as needed for mild pain or headache.     . benztropine (COGENTIN) 1 MG tablet Take 1 mg by mouth 2 (two) times daily.  1   . carvedilol (COREG) 6.25 MG tablet Take 6.25 mg by mouth 2 (two) times daily.  5   . DULoxetine (CYMBALTA) 30 MG capsule Take one (1) capsule by mouth every morning  1   . FEROSUL 325 (65 Fe) MG tablet Take 325 mg by mouth 2 (two) times daily.  0   . furosemide (LASIX) 40 MG tablet Take 40 mg by mouth daily.  5   . gabapentin (NEURONTIN) 600 MG tablet Take one (1) tablet by mouth four times a day  1   . lamoTRIgine (LAMICTAL) 25 MG tablet TAKE ONE TABLET BY MOUTH EVERY MORNING, ONE TABLET AT NOON AND TWO TABLETS AT BEDTIME  1   . LATUDA 80 MG TABS tablet Take one (1) tablet by mouth twice a day  1   . LEVEMIR FLEXTOUCH 100 UNIT/ML Pen INJECT 30 UNITS UNDER SKIN EVERY TWELVE HOURS  5   . levocetirizine  (XYZAL) 5 MG tablet Take 5 mg by mouth daily.  3   . levothyroxine (SYNTHROID, LEVOTHROID) 150 MCG tablet Take 150 mcg by mouth daily.  3   . lisinopril (PRINIVIL,ZESTRIL) 10 MG tablet Take 10 mg by mouth daily.  6   . metFORMIN (GLUCOPHAGE) 500 MG tablet Take 500 mg by mouth 2 (two) times daily.  0   . NOVOLOG FLEXPEN 100 UNIT/ML FlexPen INJECT 12 UNITS UNDER SKIN THREE TIMES DAILY  5   . OS-CAL CALCIUM + D3 500-200 MG-UNIT TABS Take 1 tablet by mouth 2 (two) times daily.  0   . pantoprazole (PROTONIX) 40 MG tablet Take 40 mg by mouth daily.  1   . RESTASIS 0.05 % ophthalmic emulsion INSTILL ONE DROP IN AFFECTED EYE TWICE DAILY AS DIRECTED  6   . valACYclovir (VALTREX) 500 MG tablet Take 500 mg by mouth daily.  5     Patient Stressors: Marital or family conflict Other: Auditory hallucinations  Patient Strengths: Ability for insight Average or above average intelligence Communication skills General fund of knowledge Supportive family/friends  Treatment Modalities: Medication Management, Group therapy, Case management,  1 to 1 session with clinician, Psychoeducation, Recreational therapy.   Physician Treatment Plan for Primary Diagnosis: Schizophrenia ([place]) Long Term Goal(s): Improvement in symptoms so as ready for discharge  Short Term Goals: Ability to identify changes in lifestyle to reduce recurrence of condition will improve Ability to verbalize feelings will improve Ability to disclose and discuss suicidal ideas Ability to demonstrate self-control will improve Ability to identify and develop effective coping behaviors will improve Ability to maintain clinical measurements within normal limits will improve  Medication Management: Evaluate patient's response, side effects, and tolerance of medication regimen.  Therapeutic Interventions: 1 to 1 sessions, Unit Group sessions and Medication administration.  Evaluation of Outcomes: Progressing  Physician Treatment Plan for  Secondary Diagnosis: Principal Problem:   Schizophrenia ([place])   Long Term Goal(s): Improvement in symptoms so as ready for discharge  Short Term Goals: Ability to identify changes in lifestyle to reduce recurrence of condition will improve Ability to verbalize feelings will improve Ability to disclose and discuss suicidal ideas Ability to demonstrate self-control will improve Ability to identify and develop  effective coping behaviors will improve Ability to maintain clinical measurements within normal limits will improve  Medication Management: Evaluate patient's response, side effects, and tolerance of medication regimen.  Therapeutic Interventions: 1 to 1 sessions, Unit Group sessions and Medication administration.  Evaluation of Outcomes: Progressing   RN Treatment Plan for Primary Diagnosis: Schizophrenia ([place]) Long Term Goal(s): Knowledge of disease and therapeutic regimen to maintain health will improve  Short Term Goals: Ability to identify and develop effective coping behaviors will improve and Compliance with prescribed medications will improve  Medication Management: RN will administer medications as ordered by provider, will assess and evaluate patient's response and provide education to patient for prescribed medication. RN will report any adverse and/or side effects to prescribing provider.  Therapeutic Interventions: 1 on 1 counseling sessions, Psychoeducation, Medication administration, Evaluate responses to treatment, Monitor vital signs and CBGs as ordered, Perform/monitor CIWA, COWS, AIMS and Fall Risk screenings as ordered, Perform wound care treatments as ordered.  Evaluation of Outcomes: Progressing   LCSW Treatment Plan for Primary Diagnosis: Schizophrenia ([place]) Long Term Goal(s): Safe transition to appropriate next level of care at discharge, Engage patient in therapeutic group addressing interpersonal concerns.  Short Term Goals: Engage patient in aftercare  planning with referrals and resources  Therapeutic Interventions: Assess for all discharge needs, 1 to 1 time with Social worker, Explore available resources and support systems, Assess for adequacy in community support network, Educate family and significant other(s) on suicide prevention, Complete Psychosocial Assessment, Interpersonal group therapy.  Evaluation of Outcomes: Progressing  States she wants to go to [facility] or [facility]"   Progress in Treatment: Attending groups: Yes Participating in groups: Yes Taking medication as prescribed: Yes Toleration medication: Yes, no side effects reported at this time Family/Significant other contact made: No Patient understands diagnosis: Yes AEB Discussing patient identified problems/goals with staff: Yes Medical problems stabilized or resolved: Yes Denies suicidal/homicidal ideation: Yes Issues/concerns per patient self-inventory: None Other: N/A  New problem(s) identified: None identified at this time.   New Short Term/Long Term Goal(s): "Think I need to be on something else"   Discharge Plan or Barriers:   Reason for Continuation of Hospitalization: Mood instability Medication stabilization   Estimated Length of Stay: 12/22  Attendees: Patient: Jessica [name] 01/04/2017  8:23 AM  Physician: Maris Berger, MD 01/04/2017  8:23 AM  Nursing: Sena Hitch, RN 01/04/2017  8:23 AM  RN Care Manager: Lars Pinks, RN 01/04/2017  8:23 AM  Social Worker: [name] Fraise 01/04/2017  8:23 AM  Recreational Therapist: Winfield Cunas 01/04/2017  8:23 AM  Other: Norberto Sorenson 01/04/2017  8:23 AM  Other:  01/04/2017  8:23 AM    Scribe for Treatment Team:  Roque Lias LCSW 01/04/2017 8:23 AM

## 2017-01-04 NOTE — Progress Notes (Signed)
Adult Psychoeducational Group Note  Date:  01/04/2017 Time:  9:59 PM  Group Topic/Focus:  Wrap-Up Group:   The focus of this group is to help patients review their daily goal of treatment and discuss progress on daily workbooks.  Participation Level:  Minimal  Participation Quality:  Appropriate  Affect:  Flat  Cognitive:  Lacking  Insight: Limited  Engagement in Group:  Engaged  Modes of Intervention:  Socialization and Support  Additional Comments:  Patient attended and participated in group tonight. She reports that she talked with the SW today. The SW will assist her with finding an Environmental consultant living arrangement in [place].  Salley Scarlet [facility] 01/04/2017, 9:59 PM

## 2017-01-04 NOTE — Progress Notes (Signed)
  D: When asked about her day pt stated, "good. I spoke to my SW. In the morning I'm gonna go to breakfast. I went to supper today". When asked if SI or SI pt stated, "I don't want to her my mom or myself. It was a good day". Pt explained that she wants to stay 10 days in the hosp, because "she wants to get well". Pt has no questions or concerns.    A:  Support and encouragement was offered. 15 min checks continued for safety.  R: Pt remains safe.

## 2017-01-04 NOTE — Progress Notes (Signed)
Recreation Therapy Notes  Date: 01/04/17 Time: 1000 Location: 500 Hall Dayroom  Group Topic: Coping Skills  Goal Area(s) Addresses:  Patient will be able to identify positive coping skills. Patient will be able to identify benefits of using coping skills. Patient will be able to identify benefits of using coping skills post d/c.  Behavioral Response: Engaged  Intervention: Mind map, pencils, white board, marker, eraser  Activity: Mind map.  Patients and LRT filled in the first eight boxes of the mind map together with depression, anxiety, hygiene, wellness, family, withdrawal, work and social interaction.  Patients were to then identify three coping skills for each of the situations identified.  The group would then reconvene and coping skills would be written on the board.   Education: Radiographer, therapeutic, Dentist.   Education Outcome: Acknowledges understanding/In group clarification offered/Needs additional education.   Clinical Observations/Feedback: Pt was bright.  Pt stated she had a hard time seeing the board because of her glasses.  Pt stated her coping skills were healthy eating for anxiety; read bible for wellness; see therapist for family; prayer for work; talk to somebody for social interaction and play with pet, exercise for depression.    Victorino Sparrow, LRT/CTRS    Victorino Sparrow A 01/04/2017 12:25 PM
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
## 2017-01-04 NOTE — Progress Notes (Signed)
Adult Psychoeducational Group Note  Date:  01/04/2017 Time:  3:21 AM  Group Topic/Focus:  Wrap-Up Group:   The focus of this group is to help patients review their daily goal of treatment and discuss progress on daily workbooks.  Participation Level:  Minimal  Participation Quality:  Appropriate  Affect:  Flat  Cognitive:  Oriented  Insight: Limited  Engagement in Group:  Engaged  Modes of Intervention:  Socialization and Support  Additional Comments:  Patient attended and participated in group tonight. She reports having a good day. She saw  the NP, SW, and her Doctor. She spoke with the SW about going to an Web designer environment. Today she went to her meals, attended her groups and went with the group to the gym.  Salley Scarlet [facility] 01/04/2017, 3:21 AM
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
## 2017-01-05 LAB — GLUCOSE, CAPILLARY
GLUCOSE-CAPILLARY: 175 mg/dL — AB (ref 65–99)
GLUCOSE-CAPILLARY: 121 mg/dL — AB (ref 65–99)
GLUCOSE-CAPILLARY: 165 mg/dL — AB (ref 65–99)

## 2017-01-05 MED ORDER — TUBERCULIN PPD 5 UNIT/0.1ML ID SOLN
5.0000 [IU] | Freq: Once | INTRADERMAL | Status: AC
  Administered 2017-01-05: 5 [IU] via INTRADERMAL

## 2017-01-05 NOTE — Progress Notes (Signed)
Recreation Therapy Notes  Date: 01/05/17 Time: 0950 Location:  500 Hall Dayroom   Group Topic: Self-Esteem  Goal Area(s) Addresses:  Patient will successfully identify positive attributes about themselves.  Patient will successfully identify benefit of improved self-esteem.   Behavioral Response: Engaged  Intervention: Magazines, scissors, glue sticks, Architect paper  Activity: Collage.  Patients were to create a collage that highlighted things about them such as what they like, new things they would like to try, things that mean something to them, etc.  Education:  Self-Esteem, Discharge Planning.   Education Outcome: Acknowledges education/In group clarification offered/Needs additional education  Clinical Observations/Feedback: Pt was pleasant and engaged.  Pt used her collage to show she is healthy, likes fashion, tea which makes her feel warm inside and beautiful.    Victorino Sparrow, LRT/CTRS    Victorino Sparrow A 01/05/2017 12:01 PM

## 2017-01-05 NOTE — B[facility] Group Notes (Signed)
LCSW Group Therapy Note   01/05/2017 1:15pm   Type of Therapy and Topic:  Group Therapy:  Positive Affirmations   Participation Level:  None  Description of Group: This group addressed positive affirmation toward self and others. Patients went around the room and identified two positive things about themselves and two positive things about a peer in the room. Patients reflected on how it felt to share something positive with others, to identify positive things about themselves, and to hear positive things from others. Patients were encouraged to have a daily reflection of positive characteristics or circumstances.  Therapeutic Goals 1. Patient will verbalize two of their positive qualities 2. Patient will demonstrate empathy for others by stating two positive qualities about a peer in the group 3. Patient will verbalize their feelings when voicing positive self affirmations and when voicing positive affirmations of others 4. Patients will discuss the potential positive impact on their wellness/recovery of focusing on positive traits of self and others. Summary of Patient Progress:  Stayed for the first half of group.  Left and did not return.  Therapeutic Modalities Cognitive Behavioral Therapy Motivational Hooks, [place] 01/05/2017 2:45 PM
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
## 2017-01-05 NOTE — Progress Notes (Signed)
Adult Psychoeducational Group Note  Date:  01/05/2017 Time:  9:31 PM  Group Topic/Focus:  Wrap-Up Group:   The focus of this group is to help patients review their daily goal of treatment and discuss progress on daily workbooks.  Participation Level:  Active  Participation Quality:  Appropriate  Affect:  Appropriate  Cognitive:  Appropriate  Insight: Appropriate  Engagement in Group:  Engaged  Modes of Intervention:  Discussion  Additional Comments: The patient expressed that she attended groups.The patient also said that she rates today a 7.  Nash Shearer 01/05/2017, 9:31 PM

## 2017-01-06 LAB — GLUCOSE, CAPILLARY
GLUCOSE-CAPILLARY: 254 mg/dL — AB (ref 65–99)
GLUCOSE-CAPILLARY: 198 mg/dL — AB (ref 65–99)
Glucose-Capillary: 222 mg/dL — ABNORMAL HIGH (ref 65–99)
Glucose-Capillary: 90 mg/dL (ref 65–99)
Glucose-Capillary: 114 mg/dL — ABNORMAL HIGH (ref 65–99)

## 2017-01-06 MED ORDER — LORATADINE 10 MG PO TABS
10.0000 mg | ORAL_TABLET | Freq: Every day | ORAL | Status: DC
  Administered 2017-01-07 – 2017-01-15 (×9): 10 mg via ORAL
  Filled 2017-01-06 (×10): qty 1

## 2017-01-06 NOTE — Progress Notes (Signed)
Recreation Therapy Notes  Date: 01/06/17 Time: 1000 Location: 500 Hall Dayroom  Group Topic: Leisure Education  Goal Area(s) Addresses:  Patient will identify positive leisure activities.  Patient will identify one positive benefit of participation in leisure activities.   Behavioral Response: Engaged  Intervention: Various words, dry erase marker, white board, eraser  Activity: Pictionary.  Patients were divided into two teams.  One person from a team would pick a word from the container and draw it on the board.  That team gets one minute to guess the picture drawn on the board.  If the team doesn't guess the picture, the other team can steal the point.  If they don't guess the picture, no one gets the point.  The other team would then go.  Education:  Leisure Education, Dentist  Education Outcome: Acknowledges education/In group clarification offered/Needs additional education  Clinical Observations/Feedback: Pt was engaged during activity.  Pt needed to be reminded of the rules a few times.  Pt was also social with her peers.   Victorino Sparrow, LRT/CTRS         Ria Comment, Lashana Spang A 01/06/2017 1:07 PM
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
## 2017-01-06 NOTE — Progress Notes (Signed)
Patient is very pleasant and cooperative, social and interactive with peers and staff. Compliant with meds and unit activities. Stated that she is looking forward to finding a group home or assisted living where she can make new friends.

## 2017-01-06 NOTE — Progress Notes (Signed)
Nursing Progress Note: 7p-7a D: Pt currently presents with a anxiety/depressed affect and behavior. Pt states "I had an ok day. Just been doing what I'm supposed to." Interacting appropriately with the milieu. Pt reports good sleep during the previous night with current medication regimen. Pt did attend wrap-up group.  A: Pt provided with medications per providers orders. Pt's labs and vitals were monitored throughout the night. Pt supported emotionally and encouraged to express concerns and questions. Pt educated on medications.  R: Pt's safety ensured with 15 minute and environmental checks. Pt currently denies SI, HI, and AVH. Pt verbally contracts to seek staff if SI,HI, or AVH occurs and to consult with staff before acting on any harmful thoughts. Will continue to monitor.

## 2017-01-06 NOTE — Progress Notes (Signed)
Patient ID: Jessica [name], female   DOB: 07-11-1965, 51 y.o.   MRN: [id number]   DAR Note: Pt is very animated. Denied depression, anxiety, pain, SI, HI or AVH; "they started me some new meds; they work real good." Pt is very hyper-verbal however, easily redirected. Medications offered as prescribed. All patient's questions and concerns addressed. Support, encouragement, and safe environment provided. 15-minute safety checks continue. Pt was med compliant. Safety checks continue. Pt did attend wrap-up group. Pt is very pleasant.

## 2017-01-06 NOTE — BHH Group Notes (Signed)
LCSW Group Therapy Note  01/06/2017 1:15pm  Type of Therapy/Topic:  Group Therapy:  Balance in Life  Participation Level:  Minimal  Description of Group:    This group will address the concept of balance and how it feels and looks when one is unbalanced. Patients will be encouraged to process areas in their lives that are out of balance and identify reasons for remaining unbalanced. Facilitators will guide patients in utilizing problem-solving interventions to address and correct the stressor making their life unbalanced. Understanding and applying boundaries will be explored and addressed for obtaining and maintaining a balanced life. Patients will be encouraged to explore ways to assertively make their unbalanced needs known to significant others in their lives, using other group members and facilitator for support and feedback.  Therapeutic Goals: 1. Patient will identify two or more emotions or situations they have that consume much of in their lives. 2. Patient will identify signs/triggers that life has become out of balance:  3. Patient will identify two ways to set boundaries in order to achieve balance in their lives:  4. Patient will demonstrate ability to communicate their needs through discussion and/or role plays  Summary of Patient Progress:  In and out multiple times.  "I know I'm balanced today because I am not cussing."    Therapeutic Modalities:   Cognitive Behavioral Therapy Solution-Focused Therapy Assertiveness Training  [name], [place] 01/06/2017 4:23 PM

## 2017-01-06 NOTE — BHH Suicide Risk Assessment (Signed)
[facility] INPATIENT:  Family/Significant Other Suicide Prevention Education  Suicide Prevention Education:  Education Completed; Brother Shaneese Tait [phone] has been identified by the patient as the family member/significant other with whom the patient will be residing, and identified as the person(s) who will aid the patient in the event of a mental health crisis (suicidal ideations/suicide attempt).  With written consent from the patient, the family member/significant other has been provided the following suicide prevention education, prior to the and/or following the discharge of the patient.  The suicide prevention education provided includes the following:  Suicide risk factors  Suicide prevention and interventions  National Suicide Hotline telephone number  [facility] assessment telephone number  [facility] Emergency Assistance [place] and/or Residential Mobile Crisis Unit telephone number  Request made of family/significant other to:  Remove weapons (e.g., guns, rifles, knives), all items previously/currently identified as safety concern.    Remove drugs/medications (over-the-counter, prescriptions, illicit drugs), all items previously/currently identified as a safety concern.  The family member/significant other verbalizes understanding of the suicide prevention education information provided.  The family member/significant other agrees to remove the items of safety concern listed above.  [name] 01/06/2017, 5:29 PM

## 2017-01-07 LAB — GLUCOSE, CAPILLARY
GLUCOSE-CAPILLARY: 125 mg/dL — AB (ref 65–99)
GLUCOSE-CAPILLARY: 177 mg/dL — AB (ref 65–99)
Glucose-Capillary: 96 mg/dL (ref 65–99)
Glucose-Capillary: 112 mg/dL — ABNORMAL HIGH (ref 65–99)

## 2017-01-07 NOTE — Progress Notes (Signed)
Pt is on unit in day room at shift change.  Pt participates in group.  Pt is a bit loud and asks many personal questions.  :Where did you get that watch?  How much did it cost?  How old are you? Pt is med compliant.  Pt has BS of 177 and get 5 units. Pt approaches nurses station for tylenol for generalized pain in legs and "bones".  Pt denies SI, HI and AVH.   Pt contracts for safety and remains safe on unit

## 2017-01-07 NOTE — Progress Notes (Signed)
DAR NOTE: Patient presents with anxious affect and depressed mood.  Denies suicidal thoughts, auditory and visual hallucinations.  Described energy level as normal and concentration as good.  Rates depression at 7, hopelessness at 0, and anxiety at 6.  Maintained on routine safety checks.  Medications given as prescribed.  Support and encouragement offered as needed.  Attended group and participated.  States goal for today is "better myself."  Patient observed socializing with peers in the dayroom.  Tylenol 650 mg given for complain of lower back pain with good effect.

## 2017-01-07 NOTE — Progress Notes (Signed)
Adult Psychoeducational Group Note  Date:  01/07/2017 Time:  9:08 PM  Group Topic/Focus:  Wrap-Up Group:   The focus of this group is to help patients review their daily goal of treatment and discuss progress on daily workbooks.  Participation Level:  Active  Participation Quality:  Appropriate  Affect:  Appropriate  Cognitive:  Appropriate  Insight: Appropriate  Engagement in Group:  Engaged  Modes of Intervention:  Discussion  Additional Comments: The  patient expressed she rates today a 10.The patient also said that she enjoyed the mental health stiory.  Nash Shearer 01/07/2017, 9:08 PM
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
## 2017-01-07 NOTE — BHH Group Notes (Signed)
[facility] Group Therapy  01/07/2017 , 2:23 PM    Type of Therapy:  Mental Health Association Presentation  Participation Level:  Active  Participation Quality:  Attentive  Affect:  Blunted  Cognitive:  Oriented  Insight:  Limited  Engagement in Therapy:  Engaged  Modes of Intervention:  Discussion, Education and Socialization  Summary of Progress/Problems:  Jessica [name]  from [place] came to present her recovery story, encourage group  members to share something about their story, and present information about the MHA.   Stayed the enitre time, engaged throughout [place] 01/07/2017 , 2:23 PM

## 2017-01-07 NOTE — Progress Notes (Signed)
Recreation Therapy Notes  Date: 01/07/17 Time: 1000 Location: 500 Hall Dayroom   Group Topic: Communication, Team Building, Problem Solving  Goal Area(s) Addresses:  Patient will effectively work with peer towards shared goal.  Patient will identify skill used to make activity successful.  Patient will identify how skills used during activity can be used to reach post d/c goals.   Behavioral Response: Engaged  Intervention: STEM Activity   Activity: [employer]. Patients were provided the following materials: 5 drinking straws, 5 rubber bands, 5 paper clips, 2 index cards, 2 drinking cups, and 2 toilet paper rolls. Using the provided materials patients were asked to build a launching mechanisms to launch a ping pong ball approximately 12 feet. Patients were divided into teams of 3-5.   Education: Education officer, community, Dentist.   Education Outcome: Acknowledges education/In group clarification offered/Needs additional education.   Clinical Observations/Feedback:  Pt was active and asked questions when she needed clarification.  Pt stated they could use the skills from this group once discharged to "help each other and communicate".    Victorino Sparrow, LRT/CTRS     Victorino Sparrow A 01/07/2017 12:32 PM
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
## 2017-01-08 LAB — GLUCOSE, CAPILLARY
GLUCOSE-CAPILLARY: 104 mg/dL — AB (ref 65–99)
GLUCOSE-CAPILLARY: 147 mg/dL — AB (ref 65–99)
Glucose-Capillary: 80 mg/dL (ref 65–99)
Glucose-Capillary: 72 mg/dL (ref 65–99)
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
## 2017-01-08 NOTE — Progress Notes (Signed)
DAR NOTE: Patient presents with anxious affect and mood.  Denies suicidal thoughts, auditory and visual hallucinations.  Described energy level as normal and concentration as good.  Rates depression at 7, hopelessness at 8, and anxiety at 9.  Maintained on routine safety checks.  Medications given as prescribed.  Support and encouragement offered as needed.  Attended group and participated.  States goal for today is "getting discharge."  Patient observed socializing with peers in the dayroom.  Patient continues to voiced concern regarding placement.  Patient is safe on the unit.

## 2017-01-08 NOTE — Progress Notes (Signed)
Did not attend group 

## 2017-01-09 DIAGNOSIS — F200 Paranoid schizophrenia: Secondary | ICD-10-CM

## 2017-01-09 LAB — GLUCOSE, CAPILLARY
GLUCOSE-CAPILLARY: 70 mg/dL (ref 65–99)
GLUCOSE-CAPILLARY: 121 mg/dL — AB (ref 65–99)
Glucose-Capillary: 85 mg/dL (ref 65–99)
Glucose-Capillary: 102 mg/dL — ABNORMAL HIGH (ref 65–99)

## 2017-01-09 MED ORDER — TRAZODONE HCL 100 MG PO TABS
100.0000 mg | ORAL_TABLET | Freq: Every evening | ORAL | Status: DC | PRN
  Administered 2017-01-09 – 2017-01-14 (×6): 100 mg via ORAL
  Filled 2017-01-09 (×6): qty 1

## 2017-01-09 NOTE — Progress Notes (Signed)
Patient ID: Jessica [name], female   DOB: 17-Feb-1965, 51 y.o.   MRN: [id number]  DAR Note: Pt was very worried about finding placement; "it has been difficult for me to find a place to stay." Pt remains animated and hyper-verbal however, easily redirected. Pt denied depression, anxiety, SI, HI or AVH; Medications offered as prescribed. All patient's questions and concerns addressed. Support, encouragement, and safe environment provided. 15-minute safety checks continue. Pt was med compliant. Safety checks continue. Pt did attend wrap-up group. Pt is very pleasant.

## 2017-01-09 NOTE — Progress Notes (Signed)
DAR NOTE: Patient presents with calm affect and pleasant mood.  Denies suicidal thoughts, auditory and visual hallucinations.  Described energy level as normal and concentration as good.  Rates depression at 0, hopelessness at 0, and anxiety at 0.  Maintained on routine safety checks.  Medications given as prescribed.  Support and encouragement offered as needed.  Attended group and participated.  States goal for today is "finding a group home."  Patient observed socializing with peers in the dayroom.  Tylenol 650 mg given for complain of back pain with good effect.

## 2017-01-09 NOTE — [facility] Group Notes (Addendum)
[facility] Group Notes:  (Nursing/MHT/Case Management/Adjunct)  Date:  01/09/2017  Time:  10:50am  Type of Therapy:  Nurse Education  Participation Level:  Active  Participation Quality:  Appropriate, Sharing and Supportive  Affect:  Excited  Cognitive:  Alert  Insight:  Improving  Engagement in Group:  Engaged and Supportive  Modes of Intervention:  Activity and Discussion  Summary of Progress/Problems:  Discussed snow flake and how no two are alike just like every individual in the world. We discussed favorite holiday memory and what was our favorite holiday movie.  Natelie was able to participate well in the group activity.  She reported her favorite movie was "It's a Immunologist Life."   Chiropodist
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
## 2017-01-09 NOTE — [facility] Group Notes (Signed)
LCSW Group Therapy Note  Date/Time:  01/09/2017    11:15am-12:00pm  Type of Therapy and Topic:  Group Therapy:  Fears and Unhealthy/Healthy Coping Skills  Participation Level:  Active   Description of Group:  The focus of this group was to discuss some of the prevalent fears that patients experience, and to identify the commonalities among group members.  A fun exercise was used to initiate the discussion, followed by writing on the white board a group-generated list of unhealthy coping and healthy coping techniques to deal with each fear.    Therapeutic Goals: 1. Patient will be able to distinguish between healthy and unhealthy coping skills 2. Patient will identify and describe 3 fears they experience 3. Patient will identify one positive coping strategy for each fear they experience 4. Patient will respond empathetically to peers' statements regarding fears they experience  Summary of Patient Progress:  The patient expressed herself freely and on topic throughout group.  She was focused on answering questions and showed an ability to discuss the responses with insight.  She also was asking numerous questions about going into a group home.  Therapeutic Modalities Cognitive Behavioral Therapy Motivational Interviewing  Selmer Dominion, LCSW

## 2017-01-10 LAB — GLUCOSE, CAPILLARY
GLUCOSE-CAPILLARY: 109 mg/dL — AB (ref 65–99)
GLUCOSE-CAPILLARY: 192 mg/dL — AB (ref 65–99)
Glucose-Capillary: 112 mg/dL — ABNORMAL HIGH (ref 65–99)
Glucose-Capillary: 121 mg/dL — ABNORMAL HIGH (ref 65–99)
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
## 2017-01-10 NOTE — Progress Notes (Addendum)
Patient ID: Jessica [name], female   DOB: 01/08/1966, 51 y.o.   MRN: [id number]   DAR Note: Pt denied depression, anxiety, pain, SI, HI or AVH; "I am just a little worried about finding placement." Pt may be confused atimes. Medications offered as prescribed. All patient's questions and concerns addressed. Support, encouragement, and safe environment provided. 15-minute safety checks continue. Pt was med compliant. Safety checks continue. Pt did attend wrap-up group. Pt is very pleasant.

## 2017-01-10 NOTE — [facility] Group Notes (Signed)
[facility] LCSW Group Therapy Note  Date/Time:  01/10/2017  11:00AM-12:00PM  Type of Therapy and Topic:  Group Therapy:  Music and Mood  Participation Level:  Active   Description of Group: In this process group, members listened to a variety of genres of music and identified that different types of music evoke different responses.  Patients were encouraged to identify music that was soothing for them and music that was energizing for them.  Patients discussed how this knowledge can help with wellness and recovery in various ways including managing depression and anxiety as well as encouraging healthy sleep habits.    Therapeutic Goals: 1. Patients will explore the impact of different varieties of music on mood 2. Patients will verbalize the thoughts they have when listening to different types of music 3. Patients will identify music that is soothing to them as well as music that is energizing to them 4. Patients will discuss how to use this knowledge to assist in maintaining wellness and recovery 5. Patients will explore the use of music as a coping skill  Summary of Patient Progress:  At the beginning of group, patient started dancing, and she danced and sang throughout group when present.  She did leave several times to go to the bathroom, said she was having trouble, would say "I feel much better now" when she returned.  Therapeutic Modalities: Solution Focused Brief Therapy Activity   Selmer Dominion, LCSW

## 2017-01-10 NOTE — Progress Notes (Signed)
DAR NOTE: Patient presents with anxious affect and depressed mood. Pt kept approaching the nurses station asking if she is doing good. Pt reports good sleep, good appetite, normal energy, and good concentration. Denies pain, auditory and visual hallucinations.  Rates depression at 7, hopelessness at 8, and anxiety at 9.  Maintained on routine safety checks.  Medications given as prescribed.  Support and encouragement offered as needed.  Attended group and participated.  States goal for today is " working on discharge." Patient observed socializing with peers in the dayroom.  Offered no complaint.

## 2017-01-11 LAB — GLUCOSE, CAPILLARY
GLUCOSE-CAPILLARY: 92 mg/dL (ref 65–99)
GLUCOSE-CAPILLARY: 79 mg/dL (ref 65–99)
GLUCOSE-CAPILLARY: 178 mg/dL — AB (ref 65–99)
Glucose-Capillary: 84 mg/dL (ref 65–99)

## 2017-01-11 NOTE — Progress Notes (Signed)
D:Patient has been in pleasant mood today. She has been observed interacting with staff and peers. Patient is taking medication as ordered. Patient rates depression a 5, hopelessness 6 and anxiety 4. Patient states she feels better. A:Patient provide support and encouragement. Q 15 minute checks in progress.  R:Patient remains safe on unit.

## 2017-01-11 NOTE — Progress Notes (Signed)
Patient ID: Jessica [name], female   DOB: Sep 11, 1965, 51 y.o.   MRN: [id number]  PER STATE REGULATIONS 482.30  THIS CHART WAS REVIEWED FOR MEDICAL NECESSITY WITH RESPECT TO THE PATIENT'S ADMISSION/ DURATION OF STAY.  NEXT REVIEW DATE: 01/14/2017  Chauncy Lean, RN, BSN CASE MANAGER

## 2017-01-11 NOTE — Progress Notes (Signed)
Recreation Therapy Notes  Date: 01/11/17 Time: 1000 Location: 500 Hall Dayroom  Group Topic: Self-Expression  Goal Area(s) Addresses:  Patient will successfully identify positive ways to express themselves.  Patient will successfully identify benefit of using positive interventions to express themselves.  Behavioral Response: Engaged  Intervention: Paint, colored pencils, Architect paper, paint brushes, water  Activity: Holiday Cards.  Patients were to use the supplies available to create holiday cards for themselves or their loved ones.  Education:  Self-Esteem, Dentist.   Education Outcome: Acknowledges education/In group clarification offered/Needs additional education  Clinical Observations/Feedback: Pt was active and social during group.  Pt talked about how she likes to draw and does abstract art.  Pt also stated her sister went to school for art and has her own greeting card line.     Victorino Sparrow, LRT/CTRS       Victorino Sparrow A 01/11/2017 12:28 PM

## 2017-01-11 NOTE — Progress Notes (Signed)
Patient ID: Jessica [name], female   DOB: 31-Jan-1965, 51 y.o.   MRN: [id number] DAR Note: Pt denied depression, anxiety, pain, SI, HI or AVH; "I'm ready to go' just waiting for placement." Pt continue to the pleasant and funny. Medications offered as prescribed. All patient's questions and concerns addressed. Support, encouragement, and safe environment provided. 15-minute safety checks continue. Pt was med compliant. Safety checks continue. Pt did attend wrap-up group.

## 2017-01-11 NOTE — Plan of Care (Signed)
Patient observed in milieu interacting with staff and peers. Patient is in a pleasant mood she [place] Patient is exited and has been sharing this news with staff and peers.

## 2017-01-11 NOTE — Progress Notes (Signed)
Pt attend wrap up group. Her day was a 9. Her goal was to work on her discharge plan. She talked to the doctor today. She plan to go to a group home. Rob is helping her find placement.
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
## 2017-01-12 LAB — GLUCOSE, CAPILLARY
GLUCOSE-CAPILLARY: 76 mg/dL (ref 65–99)
GLUCOSE-CAPILLARY: 131 mg/dL — AB (ref 65–99)
GLUCOSE-CAPILLARY: 210 mg/dL — AB (ref 65–99)
Glucose-Capillary: 132 mg/dL — ABNORMAL HIGH (ref 65–99)

## 2017-01-12 NOTE — Progress Notes (Signed)
D: Pt was at the nurse's station upon initial approach.  Pt presents with appropriate affect and mood.  Her goal today was to "get a group home and it came through, I'm going there Friday morning, I feel positive."  Pt reports she will be going to a group home in [place]  Pt denies SI/HI, denies hallucinations, reports bilateral leg pain of 8/10.  Pt has been visible in milieu interacting with peers and staff appropriately.     A: Introduced self to pt.  Actively listened to pt and offered support and encouragement. Medications administered per order.  PRN medication administered for anxiety, pain, and sleep.  Q15 minute safety checks maintained.  R: Pt is safe on the unit.  Pt is compliant with medications.  Pt verbally contracts for safety.  Will continue to monitor and assess.
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
## 2017-01-12 NOTE — Progress Notes (Signed)
Cooperative with unit routine, interactive with peers and staff. Occasionally intrusive requiring redirection. Talks excitedly about discharge to group home. Denies SI and AVH.

## 2017-01-12 NOTE — Progress Notes (Signed)
Adult Psychoeducational Group Note  Date:  01/12/2017 Time:  9:38 PM  Group Topic/Focus:  Wrap-Up Group:   The focus of this group is to help patients review their daily goal of treatment and discuss progress on daily workbooks.  Participation Level:  Active  Participation Quality:  Appropriate  Affect:  Appropriate  Cognitive:  Appropriate  Insight: Appropriate  Engagement in Group:  Engaged  Modes of Intervention:  Discussion  Additional Comments:  The patient expressed that she had a great day. Nash Shearer 01/12/2017, 9:38 PM

## 2017-01-13 LAB — GLUCOSE, CAPILLARY
GLUCOSE-CAPILLARY: 84 mg/dL (ref 65–99)
GLUCOSE-CAPILLARY: 174 mg/dL — AB (ref 65–99)
GLUCOSE-CAPILLARY: 141 mg/dL — AB (ref 65–99)
Glucose-Capillary: 131 mg/dL — ABNORMAL HIGH (ref 65–99)
Glucose-Capillary: 68 mg/dL (ref 65–99)

## 2017-01-13 NOTE — Plan of Care (Signed)
Nurse discussed anxiety, depression, coping skills with patient. 

## 2017-01-13 NOTE — Progress Notes (Signed)
Patient's CBG before lunch was 68.  After lunch CBG was 84.  NP stated not to give this patient any insulin at lunch.

## 2017-01-13 NOTE — Progress Notes (Signed)
Patient's CBG was 68 before lunch.  NP said for patient to eat lunch, retake CBG again.  Patient informed.

## 2017-01-13 NOTE — Progress Notes (Signed)
Recreation Therapy Notes  Date: 01/13/17 Time: 1000 Location: 500 Hall Dayroom  Group Topic: Coping Skills  Goal Area(s) Addresses:  Patient will be able to identify positive coping skills. Patient will be able to identify the benefits of using positive coping skills. Patient will identify benefits of using coping skills post d/c.  Behavioral Response: Engaged  Intervention: Building surveyor, pencils  Activity: Building surveyor.  Patient will place the things that have gotten them stuck inside the spider web.  Patients were to then identify their coping skills and place them outside the spider web.  Education:Coping Skills, Discharge Planning.   Education Outcome: Acknowledges understanding/In group clarification offered/Needs additional education.   Clinical Observations/Feedback: Pt stated coping skills was caring for others.  Pt stated the things that have her stuck are "fighting with my mother and not getting along with my family".  Pt stated her coping skills were shopping, go to her room, listen to radio and do art work.  Pt also expressed when she gets angry, she lashes out.  Pt went on to explain her medicine has been changed which will help with her anger.  Moving forward, pt stated she could "put things in the past and look towards the future".    Victorino Sparrow, LRT/CTRS      Victorino Sparrow A 01/13/2017 12:06 PM
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
## 2017-01-13 NOTE — Progress Notes (Signed)
Patient ID: Jessica [name], female   DOB: 1965/07/23, 51 y.o.   MRN: [id number]  DAR Note: Pt is very animated. Pt remained very worried about getting a good report by the RN; "I have been very good or have I done something wrong? Are you going to wright a good report about me because I may not be allowed to go to the new place with a bad report. Pt denied depression, anxiety, pain, SI, HI or AVH; "I am very excited." Medications offered as prescribed. All patient's questions and concerns addressed. Support, encouragement, and safe environment provided. 15-minute safety checks continue. Pt was med compliant. Safety checks continue. Pt did attend wrap-up group. Pt is very pleasant.
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
## 2017-01-13 NOTE — Progress Notes (Signed)
D:  Patient's self inventory sheet, patient sleeps good, sleep medication helpful.  Good appetite, normal energy level, anxiety 3.  Denied withdrawals.  Denied SI.  Physical problems, pain, legs and back.  Worst pain in past 24 hours is #3.  Goal is discharge.  Plans to be discharged.  Does have discharge plans. A:  Medicattons administered per MD orders.  Emotional support and encouragement given patient. R:  Patient denied SI and HI, contracts for safety.  Denied A/V hallucinations.  Safety maintained with 15 minute checks.

## 2017-01-14 LAB — GLUCOSE, CAPILLARY
GLUCOSE-CAPILLARY: 76 mg/dL (ref 65–99)
GLUCOSE-CAPILLARY: 82 mg/dL (ref 65–99)
Glucose-Capillary: 147 mg/dL — ABNORMAL HIGH (ref 65–99)
Glucose-Capillary: 161 mg/dL — ABNORMAL HIGH (ref 65–99)

## 2017-01-14 NOTE — Progress Notes (Signed)
[facility] MD Progress Note  01/14/2017 12:46 PM Jessica [name]  MRN:  [id number] Subjective:   Jessica [name] is a 51 y/o F with history of schizophrenia who was admitted due to worsening mood symptoms, agitation, and psychosis. Pt was aggressive towards her elderly mother with whom she was living. Pt was restarted on medications of cymbalta, neurontin, lamictal, and latuda, and she was observed on the inpatient psychiatry unit.She had dose of lamictal increased and consolidated to once-daily dosing in the evening. Pt has been calm and cooperative since admission on the unit, and SW team has secured placement at a group home in [facility].  Today upon evaluation, pt reports she is doing well overall, but she had some increased anxiety last night when she got a new roommate who was irritable and responding to internal stimuli. Pt shares, "I had to walk over to the quiet room because I couldn't sleep last night." Aside from that disturbance, pt reports she feeling stable and ready for discharge to group home tomorrow. She denies SI/HI/AH/VH. She is tolerating her medications well without difficulty or side effects. She agrees to continue her current regimen without changes at this time. Pt had no further questions, comments, or concerns.   Principal Problem: Schizophrenia ([place]) Diagnosis:   Patient Active Problem List   Diagnosis Date Noted  . Schizophrenia ([place]) [F20.9] 01/02/2017   Total Time spent with patient: 30 minutes  Past Psychiatric History: see H&P  Past Medical History:  Past Medical History:  Diagnosis Date  . Anxiety   . Depression   . Diabetes mellitus without complication ([place])   . GERD (gastroesophageal reflux disease)   . Hypertension    History reviewed. No pertinent surgical history. Family History: History reviewed. No pertinent family history. Family Psychiatric  History: see H&P Social History:  Social History   Substance and Sexual Activity  Alcohol Use No  .  Frequency: Never     Social History   Substance and Sexual Activity  Drug Use Yes  . Types: Marijuana, "Crack" cocaine   Comment: reports she has not done anything in a long time    Social History   Socioeconomic History  . Marital status: Single    Spouse name: None  . Number of children: None  . Years of education: None  . Highest education level: None  Social Needs  . Financial resource strain: None  . Food insecurity - worry: None  . Food insecurity - inability: None  . Transportation needs - medical: None  . Transportation needs - non-medical: None  Occupational History  . None  Tobacco Use  . Smoking status: Former Research scientist (life sciences)  . Smokeless tobacco: Never Used  Substance and Sexual Activity  . Alcohol use: No    Frequency: Never  . Drug use: Yes    Types: Marijuana, "Crack" cocaine    Comment: reports she has not done anything in a long time  . Sexual activity: None  Other Topics Concern  . None  Social History Narrative  . None   Additional Social History:    Pain Medications: pt denies abuse - see pta meds list Prescriptions: pt denies abuse - see pta meds list Over the Counter: pt denies abuse - see pta meds list History of alcohol / drug use?: Yes Name of Substance 1: crack cocaine 1 - Age of First Use: unknown 1 - Amount (size/oz): unknown 1 - Frequency: unknown 1 - Duration: unknown 1 - Last Use / Amount: two mos ago  Name of Substance 2: THC 2 - Age of First Use: unknown 2 - Amount (size/oz): unknown 2 - Frequency: unknown 2 - Duration: unknown 2 - Last Use / Amount: unknown - pt reports hx of THC abuse                Sleep: Poor  Appetite:  Fair  Current Medications: Current Facility-Administered Medications  Medication Dose Route Frequency Provider Last Rate Last Dose  . acetaminophen (TYLENOL) tablet 650 mg  650 mg Oral Q6H PRN Okonkwo, Justina A, NP   650 mg at 01/14/17 0818  . alum & mag hydroxide-simeth (MAALOX/MYLANTA) 200-200-20  MG/5ML suspension 30 mL  30 mL Oral Q4H PRN Okonkwo, Justina A, NP      . benztropine (COGENTIN) tablet 1 mg  1 mg Oral BID Lindon Romp A, NP   1 mg at 01/14/17 0814  . calcium-vitamin D (OSCAL WITH D) 500-200 MG-UNIT per tablet 1 tablet  1 tablet Oral BID Lindon Romp A, NP   1 tablet at 01/14/17 [phone]  . carvedilol (COREG) tablet 6.25 mg  6.25 mg Oral BID Lindon Romp A, NP   6.25 mg at 01/14/17 6237  . cycloSPORINE (RESTASIS) 0.05 % ophthalmic emulsion 1 drop  1 drop Both Eyes BID Lindon Romp A, NP   1 drop at 01/14/17 6283  . DULoxetine (CYMBALTA) DR capsule 60 mg  60 mg Oral Daily Lindon Romp A, NP   60 mg at 01/14/17 1517  . ferrous sulfate tablet 325 mg  325 mg Oral BID Lindon Romp A, NP   325 mg at 01/14/17 6160  . gabapentin (NEURONTIN) tablet 600 mg  600 mg Oral TID Lindon Romp A, NP   600 mg at 01/14/17 7371  . hydrOXYzine (ATARAX/VISTARIL) tablet 25 mg  25 mg Oral TID PRN Hughie Closs A, NP   25 mg at 01/13/17 2123  . insulin aspart (novoLOG) injection 0-5 Units  0-5 Units Subcutaneous TID WC Derrill Center, NP   2 Units at 01/12/17 1702  . insulin detemir (LEVEMIR) injection 30 Units  30 Units Subcutaneous Q12H Lindon Romp A, NP   30 Units at 01/14/17 0815  . lamoTRIgine (LAMICTAL) tablet 100 mg  100 mg Oral QHS Pennelope Bracken, MD   100 mg at 01/13/17 2124  . levothyroxine (SYNTHROID, LEVOTHROID) tablet 150 mcg  150 mcg Oral Daily Lindon Romp A, NP   150 mcg at 01/14/17 0626  . lisinopril (PRINIVIL,ZESTRIL) tablet 10 mg  10 mg Oral Daily Lindon Romp A, NP   10 mg at 01/14/17 9485  . loratadine (CLARITIN) tablet 10 mg  10 mg Oral Daily Minda Ditto, RPH   10 mg at 01/14/17 4627  . lurasidone (LATUDA) tablet 80 mg  80 mg Oral BID PC Lindon Romp A, NP   80 mg at 01/14/17 0350  . magnesium hydroxide (MILK OF MAGNESIA) suspension 30 mL  30 mL Oral Daily PRN Okonkwo, Justina A, NP      . metFORMIN (GLUCOPHAGE) tablet 500 mg  500 mg Oral BID Lindon Romp A, NP   500 mg  at 01/14/17 0814  . pantoprazole (PROTONIX) EC tablet 40 mg  40 mg Oral Daily Lindon Romp A, NP   40 mg at 01/14/17 0938  . traZODone (DESYREL) tablet 100 mg  100 mg Oral QHS PRN Benjamine Mola, FNP   100 mg at 01/13/17 2123  . valACYclovir (VALTREX) tablet 500 mg  500 mg Oral Daily Rozetta Nunnery,  NP   500 mg at 01/14/17 4742    Lab Results:  Results for orders placed or performed during the hospital encounter of 01/02/17 (from the past 48 hour(s))  Glucose, capillary     Status: Abnormal   Collection Time: 01/12/17  4:56 PM  Result Value Ref Range   Glucose-Capillary 210 (H) 65 - 99 mg/dL  Glucose, capillary     Status: Abnormal   Collection Time: 01/12/17  9:03 PM  Result Value Ref Range   Glucose-Capillary 132 (H) 65 - 99 mg/dL  Glucose, capillary     Status: Abnormal   Collection Time: 01/13/17  6:45 AM  Result Value Ref Range   Glucose-Capillary 131 (H) 65 - 99 mg/dL  Glucose, capillary     Status: None   Collection Time: 01/13/17 11:34 AM  Result Value Ref Range   Glucose-Capillary 68 65 - 99 mg/dL  Glucose, capillary     Status: None   Collection Time: 01/13/17 12:52 PM  Result Value Ref Range   Glucose-Capillary 84 65 - 99 mg/dL  Glucose, capillary     Status: Abnormal   Collection Time: 01/13/17  4:53 PM  Result Value Ref Range   Glucose-Capillary 174 (H) 65 - 99 mg/dL  Glucose, capillary     Status: Abnormal   Collection Time: 01/13/17  8:30 PM  Result Value Ref Range   Glucose-Capillary 141 (H) 65 - 99 mg/dL  Glucose, capillary     Status: None   Collection Time: 01/14/17  6:15 AM  Result Value Ref Range   Glucose-Capillary 76 65 - 99 mg/dL  Glucose, capillary     Status: None   Collection Time: 01/14/17 12:03 PM  Result Value Ref Range   Glucose-Capillary 82 65 - 99 mg/dL    Blood Alcohol level:  No results found for: [facility]  Metabolic Disorder Labs: Lab Results  Component Value Date   HGBA1C 6.3 (H) 01/03/2017   MPG 134 01/03/2017   No results found  for: PROLACTIN Lab Results  Component Value Date   CHOL 223 (H) 01/03/2017   TRIG 228 (H) 01/03/2017   HDL 43 01/03/2017   CHOLHDL 5.2 01/03/2017   VLDL 46 (H) 01/03/2017   LDLCALC 134 (H) 01/03/2017    Physical Findings: AIMS: Facial and Oral Movements Muscles of Facial Expression: None, normal Lips and Perioral Area: None, normal Jaw: None, normal Tongue: None, normal,Extremity Movements Upper (arms, wrists, hands, fingers): None, normal Lower (legs, knees, ankles, toes): None, normal, Trunk Movements Neck, shoulders, hips: None, normal, Overall Severity Severity of abnormal movements (highest score from questions above): None, normal Incapacitation due to abnormal movements: None, normal Patient's awareness of abnormal movements (rate only patient's report): No Awareness, Dental Status Current problems with teeth and/or dentures?: No Does patient usually wear dentures?: No  CIWA:  CIWA-Ar Total: 1 COWS:  COWS Total Score: 2  Musculoskeletal: Strength & Muscle Tone: within normal limits Gait & Station: normal Patient leans: N/A  Psychiatric Specialty Exam: Physical Exam  Nursing note and vitals reviewed.   Review of Systems  Constitutional: Negative for chills and fever.  Respiratory: Negative for cough and shortness of breath.   Cardiovascular: Negative for chest pain and palpitations.  Gastrointestinal: Negative for abdominal pain, heartburn, nausea and vomiting.  Psychiatric/Behavioral: Negative for depression, hallucinations and suicidal ideas. The patient is nervous/anxious and has insomnia.     Blood pressure 126/74, pulse 92, temperature 98.3 F (36.8 C), temperature source Oral, resp. rate 16, height 4\' 11"  (1.499  m), weight 84.8 kg (187 lb).Body mass index is 37.77 kg/m.  General Appearance: Casual and Fairly Groomed  Eye Contact:  Good  Speech:  Clear and Coherent and Normal Rate  Volume:  Normal  Mood:  Anxious and Euthymic  Affect:  Appropriate and  Congruent  Thought Process:  Coherent and Goal Directed  Orientation:  Full (Time, Place, and Person)  Thought Content:  Logical  Suicidal Thoughts:  No  Homicidal Thoughts:  No  Memory:  Immediate;   Good Recent;   Good Remote;   Good  Judgement:  Fair  Insight:  Fair  Psychomotor Activity:  Normal  Concentration:  Concentration: Fair  Recall:  [employer] of Knowledge:  Fair  Language:  Fair  Akathisia:  No  Handed:    AIMS (if indicated):     Assets:  Armed forces logistics/support/administrative officer Physical Health Resilience  ADL's:  Intact  Cognition:  WNL  Sleep:  Number of Hours: 6     Treatment Plan Summary: Daily contact with patient to assess and evaluate symptoms and progress in treatment and Medication management. Pt demonstrates ongoing stability of her mood symptoms and she is appropriate for anticipated discharge to group home in [facility] tomorrow.   - Continue inpatient hospitalization  - Schizophrenia - Continue cymbalta 60mg  qDay - Continue latuda 80mg  BID with food - Continuelamictal 100mg  qhs - Continue gabapentin 600mg  QID - HTN - Continue carvedilol 6.25mg  BID, lisinopril  - anxiety - continue atarax 25mg  TID prn anxiety - DMII - Continue SSI, metformin - insomnia - Continue Trazodone 100mg  qhs prn insomnia  -Encourage participation in groups and therapeutic milieu -Discharge planning will be ongoing    Pennelope Bracken, MD 01/14/2017, 12:46 PM

## 2017-01-14 NOTE — Progress Notes (Signed)
Recreation Therapy Notes  Date: 01/14/17 Time: 0950 Location: 500 Hall Dayroom  Group Topic: Wellness  Goal Area(s) Addresses:  Patient will define components of whole wellness. Patient will verbalize benefit of whole wellness.  Behavioral Response: Engaged  Intervention: Tax adviser, meditation script, music  Activity: Physicist, medical, Guided Meditation and Dancing.  LRT led patients through some chair exercises.  LRT also led patients through a short meditation before finishing with some dancing.  Education: Wellness, Dentist.   Education Outcome: Acknowledges education/In group clarification offered/Needs additional education.   Clinical Observations/Feedback: Pt was bright and active during group.  Pt participated in the exercises and meditation.  Pt stated she couldn't dance unless it something like [place] or [place].  Pt was pleasant.    Victorino Sparrow, LRT/CTRS      Victorino Sparrow A 01/14/2017 11:34 AM

## 2017-01-14 NOTE — Progress Notes (Signed)
Patient denies SI, HI and AVH this shift.  Patient reported being excited to discharge to the group home tomorrow. Patient has been compliant with therapy, attending groups, and has had no incident of behavioral dyscontrol.     Assess patient for safety, offer medications as prescribed, engage patient in 1:1 staff talks.   Patient able to contract for safety. Continue to monitor as prescribed.

## 2017-01-14 NOTE — Progress Notes (Signed)
Patient ID: Jessica [name], female   DOB: 1965-07-10, 51 y.o.   MRN: [id number] PER STATE REGULATIONS 482.30  THIS CHART WAS REVIEWED FOR MEDICAL NECESSITY WITH RESPECT TO THE PATIENT'S ADMISSION/ DURATION OF STAY.  NEXT REVIEW DATE: 01/17/2017  Chauncy Lean, RN, BSN CASE MANAGER

## 2017-01-14 NOTE — Progress Notes (Signed)
D: Patient was cheerful and pleasant at the beginning of the shift until she got a room mate. Patient reported that she does not like her room mate because roomate would not talk to her.  Patient opted to sleep in quiet room. Patient Denies pain, SI/HI, AH/VH states her day was "very good and I enjoyed Christmas day here. I got many useful gifts". Patient accepted her bedtime med.  A: Staff offered support and encouragement as needed. Routine safety checks maintained. Will continue to monitor patient.  R: Patient remains safe on unit.

## 2017-01-15 LAB — GLUCOSE, CAPILLARY
GLUCOSE-CAPILLARY: 80 mg/dL (ref 65–99)
Glucose-Capillary: 77 mg/dL (ref 65–99)

## 2017-01-15 MED ORDER — TRAZODONE HCL 100 MG PO TABS: 100.0000 mg | ORAL_TABLET | Freq: Every evening | ORAL | 0 refills | Status: DC | PRN

## 2017-01-15 MED ORDER — LISINOPRIL 10 MG PO TABS: 10.0000 mg | ORAL_TABLET | Freq: Every day | ORAL | 0 refills | Status: DC

## 2017-01-15 MED ORDER — LURASIDONE HCL 80 MG PO TABS: 80.0000 mg | ORAL_TABLET | Freq: Two times a day (BID) | ORAL | 0 refills | Status: DC

## 2017-01-15 MED ORDER — CARVEDILOL 6.25 MG PO TABS: 6.2500 mg | ORAL_TABLET | Freq: Two times a day (BID) | ORAL | 0 refills | Status: DC

## 2017-01-15 MED ORDER — RESTASIS 0.05 % OP EMUL: OPHTHALMIC | 0 refills | Status: DC

## 2017-01-15 MED ORDER — LAMOTRIGINE 100 MG PO TABS: 100.0000 mg | ORAL_TABLET | Freq: Every day | ORAL | 0 refills | Status: DC

## 2017-01-15 MED ORDER — LEVEMIR FLEXTOUCH 100 UNIT/ML ~~LOC~~ SOPN: PEN_INJECTOR | SUBCUTANEOUS | 0 refills | Status: DC

## 2017-01-15 MED ORDER — METFORMIN HCL 500 MG PO TABS: 500.0000 mg | ORAL_TABLET | Freq: Two times a day (BID) | ORAL | 0 refills | Status: DC

## 2017-01-15 MED ORDER — DULOXETINE HCL 60 MG PO CPEP: 60.0000 mg | ORAL_CAPSULE | Freq: Every day | ORAL | 0 refills | Status: DC

## 2017-01-15 MED ORDER — HYDROXYZINE HCL 25 MG PO TABS: 25.0000 mg | ORAL_TABLET | Freq: Three times a day (TID) | ORAL | 0 refills | Status: DC | PRN

## 2017-01-15 MED ORDER — OS-CAL CALCIUM + D3 500-200 MG-UNIT PO TABS: 1.0000 | ORAL_TABLET | Freq: Two times a day (BID) | ORAL | 0 refills | Status: DC

## 2017-01-15 MED ORDER — GABAPENTIN 600 MG PO TABS: 600.0000 mg | ORAL_TABLET | Freq: Three times a day (TID) | ORAL | 0 refills | Status: DC

## 2017-01-15 MED ORDER — VALACYCLOVIR HCL 500 MG PO TABS: 500.0000 mg | ORAL_TABLET | Freq: Every day | ORAL | 0 refills | Status: DC

## 2017-01-15 MED ORDER — ACETAMINOPHEN 325 MG PO TABS: 650.0000 mg | ORAL_TABLET | Freq: Four times a day (QID) | ORAL | Status: DC | PRN

## 2017-01-15 MED ORDER — LORATADINE 10 MG PO TABS: 10.0000 mg | ORAL_TABLET | Freq: Every day | ORAL | 0 refills | Status: DC

## 2017-01-15 MED ORDER — LEVOTHYROXINE SODIUM 150 MCG PO TABS: 150.0000 ug | ORAL_TABLET | Freq: Every day | ORAL | 0 refills | Status: DC

## 2017-01-15 MED ORDER — PANTOPRAZOLE SODIUM 40 MG PO TBEC: 40.0000 mg | DELAYED_RELEASE_TABLET | Freq: Every day | ORAL | 0 refills | Status: DC

## 2017-01-15 MED ORDER — BENZTROPINE MESYLATE 1 MG PO TABS: 1.0000 mg | ORAL_TABLET | Freq: Two times a day (BID) | ORAL | 0 refills | Status: DC

## 2017-01-15 MED ORDER — FERROUS SULFATE 325 (65 FE) MG PO TABS: 325.0000 mg | ORAL_TABLET | Freq: Two times a day (BID) | ORAL | 0 refills | Status: DC

## 2017-01-15 NOTE — Plan of Care (Signed)
Pt was able to identify coping skills to help with her anger after completing coping skills recreation therapy sessions.   Victorino Sparrow, LRT/CTRS

## 2017-01-15 NOTE — Progress Notes (Signed)
Patient ID: Jessica [name], female   DOB: [birth year], 51 y.o.   MRN: [id number] DAR Note: Pt remained in room for most of the evening. Pt denied depression, anxiety, pain, SI, HI or AVH; "I will be leaving tomorrow." Pt continue to the pleasant and funny. Medications offered as prescribed. All patient's questions and concerns addressed. Support, encouragement, and safe environment provided. 15-minute safety checks continue. Pt was med compliant. Safety checks continue. Pt did attend wrap-up group.

## 2017-01-15 NOTE — Progress Notes (Signed)
D: Pt was cooperative and polite all a.m. She denied SI, HI, and AVH as well as all psychiatric concerns. She was mostly preoccupied with when her discharge would occur.   A: Meds given as ordered. Q15 safety checks maintained. Support/encouragement offered.  R: Pt remains free from harm and continues with treatment. Will continue to monitor for needs/safety.

## 2017-01-15 NOTE — Progress Notes (Signed)
Patient was discharged per order. AVS, SRA, medications, scripts and transition summary were all reviewed with patient. Pt was given an opportunity to ask questions and verbalized understanding of all discharge paperwork. Belongings were returned, and patient signed for receipt. Patient verbalized readiness for discharge and appeared in no acute distress when escorted to lobby, where ride to group home awaited.

## 2017-01-15 NOTE — Progress Notes (Signed)
Recreation Therapy Notes  INPATIENT RECREATION TR PLAN  Patient Details Name: Jessica [name] MRN: [id number] DOB: 1965-05-26 Today's Date: 01/15/2017  Rec Therapy Plan Is patient appropriate for Therapeutic Recreation?: Yes Treatment times per week: about 3 days Estimated Length of Stay: 5-7 days TR Treatment/Interventions: Group participation (Comment)  Discharge Criteria Pt will be discharged from therapy if:: Discharged Treatment plan/goals/alternatives discussed and agreed upon by:: Patient/family  Discharge Summary Short term goals set: Pt will be able to identify coping skills for anger at completion of recreation therapy sessions. Short term goals met: Complete Progress toward goals comments: Groups attended Which groups?: Self-esteem, Wellness, Communication, Leisure education, Coping skills, Other (Comment)(Self-expression, Teambuilding) Reason goals not met: None Therapeutic equipment acquired: N/A Reason patient discharged from therapy: Discharge from hospital Pt/family agrees with progress & goals achieved: Yes Date patient discharged from therapy: 01/15/17    Victorino Sparrow, LRT/CTRS  Ria Comment, Latiffany Harwick A 01/15/2017, 11:57 AM

## 2017-01-15 NOTE — NC FL2 (Signed)
[place] LEVEL OF CARE SCREENING TOOL     IDENTIFICATION  Patient Name: Jessica [name] Birthdate: April 21, 1965 Sex: female Admission Date (Current Location): 01/02/2017  [facility] and [place] Number:  Jessica [name] [id number] [place] and Address:  [facility] [address]  [place] [phone])      Provider Number: [id number]  Attending Physician Name and Address:  Pennelope Bracken*  Relative Name and Phone Number:  [name], brother, [phone]    Current Level of Care: Hospital Recommended Level of Care: [place], [place] Prior Approval Number:    Date Approved/Denied:   PASRR Number:    Discharge Plan: Domiciliary (Rest home)    Current Diagnoses: Patient Active Problem List   Diagnosis Date Noted  . Schizophrenia ([place]) 01/02/2017    Orientation RESPIRATION BLADDER Height & Weight     Self, Time, Situation, Place  Normal Continent Weight: 187 lb (84.8 kg) Height:  4\' 11"  (149.9 cm)  BEHAVIORAL SYMPTOMS/MOOD NEUROLOGICAL BOWEL NUTRITION STATUS  Physically abusive (None) Continent (Regular diet)  AMBULATORY STATUS COMMUNICATION OF NEEDS Skin   Independent Verbally Normal                       Personal Care Assistance Level of Assistance  Bathing, Feeding, Dressing Bathing Assistance: Independent Feeding assistance: Independent Dressing Assistance: Independent     Functional Limitations Info  (none)          SPECIAL CARE FACTORS FREQUENCY  (None)                    Contractures Contractures Info: Not present    Additional Factors Info  (None)               Current Medications (01/15/2017):  This is the current hospital active medication list Current Facility-Administered Medications  Medication Dose Route Frequency Provider Last Rate Last Dose  . acetaminophen (TYLENOL) tablet 650 mg  650 mg Oral Q6H PRN Okonkwo, Justina A, NP   650 mg at 01/15/17 0823  . alum & mag hydroxide-simeth  (MAALOX/MYLANTA) 200-200-20 MG/5ML suspension 30 mL  30 mL Oral Q4H PRN Okonkwo, Justina A, NP      . benztropine (COGENTIN) tablet 1 mg  1 mg Oral BID Lindon Romp A, NP   1 mg at 01/15/17 7106  . calcium-vitamin D (OSCAL WITH D) 500-200 MG-UNIT per tablet 1 tablet  1 tablet Oral BID Lindon Romp A, NP   1 tablet at 01/15/17 [phone]  . carvedilol (COREG) tablet 6.25 mg  6.25 mg Oral BID Lindon Romp A, NP   6.25 mg at 01/15/17 8546  . cycloSPORINE (RESTASIS) 0.05 % ophthalmic emulsion 1 drop  1 drop Both Eyes BID Lindon Romp A, NP   1 drop at 01/15/17 [phone]  . DULoxetine (CYMBALTA) DR capsule 60 mg  60 mg Oral Daily Lindon Romp A, NP   60 mg at 01/15/17 5009  . ferrous sulfate tablet 325 mg  325 mg Oral BID Lindon Romp A, NP   325 mg at 01/15/17 3818  . gabapentin (NEURONTIN) tablet 600 mg  600 mg Oral TID Lindon Romp A, NP   600 mg at 01/15/17 2993  . hydrOXYzine (ATARAX/VISTARIL) tablet 25 mg  25 mg Oral TID PRN Hughie Closs A, NP   25 mg at 01/14/17 2101  . insulin aspart (novoLOG) injection 0-5 Units  0-5 Units Subcutaneous TID WC Derrill Center, NP   2 Units  at 01/12/17 1702  . insulin detemir (LEVEMIR) injection 30 Units  30 Units Subcutaneous Q12H Lindon Romp A, NP   30 Units at 01/15/17 8527  . lamoTRIgine (LAMICTAL) tablet 100 mg  100 mg Oral QHS Pennelope Bracken, MD   100 mg at 01/14/17 2101  . levothyroxine (SYNTHROID, LEVOTHROID) tablet 150 mcg  150 mcg Oral Daily Lindon Romp A, NP   150 mcg at 01/15/17 0820  . lisinopril (PRINIVIL,ZESTRIL) tablet 10 mg  10 mg Oral Daily Lindon Romp A, NP   10 mg at 01/15/17 0820  . loratadine (CLARITIN) tablet 10 mg  10 mg Oral Daily Minda Ditto, RPH   10 mg at 01/15/17 0820  . lurasidone (LATUDA) tablet 80 mg  80 mg Oral BID PC Lindon Romp A, NP   80 mg at 01/15/17 7824  . magnesium hydroxide (MILK OF MAGNESIA) suspension 30 mL  30 mL Oral Daily PRN Okonkwo, Justina A, NP      . metFORMIN (GLUCOPHAGE) tablet 500 mg  500 mg Oral BID  Lindon Romp A, NP   500 mg at 01/15/17 2353  . pantoprazole (PROTONIX) EC tablet 40 mg  40 mg Oral Daily Lindon Romp A, NP   40 mg at 01/15/17 6144  . traZODone (DESYREL) tablet 100 mg  100 mg Oral QHS PRN Benjamine Mola, FNP   100 mg at 01/14/17 2101  . valACYclovir (VALTREX) tablet 500 mg  500 mg Oral Daily Lindon Romp A, NP   500 mg at 01/15/17 3154     Discharge Medications: Please see discharge summary for a list of discharge medications.  Relevant Imaging Results:  Relevant Lab Results:   Additional [place], LCSW

## 2017-01-15 NOTE — Progress Notes (Signed)
Recreation Therapy Notes  Date: 01/15/17 Time: 1000 Location: 500 Hall Dayroom  Group Topic: Communication, Team Building, Problem Solving  Goal Area(s) Addresses:  Patient will effectively work with peers as a team  Patient will identify skill used to make activity successful.  Patient will identify how skills used during activity can be used to reach post d/c goals.   Behavioral Response: Engaged  Intervention: Beach ball  Activity:  Patients were placed in a small circle and tossed the ball back and forth to each other.   Education: Education officer, community, Dentist.   Education Outcome: Acknowledges education/In group clarification offered/Needs additional education.   Clinical Observations/Feedback: Pt was bright and active during group.  Pt was pleasant and patient with her peer.  Pt was social and able to focus on activity.    Victorino Sparrow, LRT/CTRS    Ria Comment, Tacara Hadlock A 01/15/2017 11:40 AM

## 2017-01-15 NOTE — Progress Notes (Signed)
  [facility] Adult Case Management Discharge Plan :  Will you be returning to the same living situation after discharge:  No, Group home  At discharge, do you have transportation home?: Yes,  Group home  Do you have the ability to pay for your medications: Yes,  insurance   Release of information consent forms completed and in the chart;  Patient's signature needed at discharge.  Patient to Follow up at: Follow-up Information    [facility] Follow up.   Why:  Go the the walk-in clinic with-in 3 days of d/c for your hospital follow up apointment Contact information: [place] [address] [phone]           Next level of care provider has access to [place] and Suicide Prevention discussed: Yes,  with pt and brother   Have you used any form of tobacco in the last 30 days? (Cigarettes, Smokeless Tobacco, Cigars, and/or Pipes): No  Has patient been referred to the Quitline?: N/A patient is not a smoker  Patient has been referred for addiction treatment: [place], LCSW 01/15/2017, 11:09 AM
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
## 2017-01-18 DIAGNOSIS — E039 Hypothyroidism, unspecified: Secondary | ICD-10-CM | POA: Diagnosis not present

## 2017-01-18 DIAGNOSIS — E559 Vitamin D deficiency, unspecified: Secondary | ICD-10-CM | POA: Diagnosis not present

## 2017-01-18 DIAGNOSIS — G4700 Insomnia, unspecified: Secondary | ICD-10-CM | POA: Diagnosis not present

## 2017-01-18 DIAGNOSIS — F209 Schizophrenia, unspecified: Secondary | ICD-10-CM | POA: Diagnosis not present

## 2017-01-18 DIAGNOSIS — E119 Type 2 diabetes mellitus without complications: Secondary | ICD-10-CM | POA: Diagnosis not present

## 2017-01-18 DIAGNOSIS — D509 Iron deficiency anemia, unspecified: Secondary | ICD-10-CM | POA: Diagnosis not present

## 2017-01-18 DIAGNOSIS — K219 Gastro-esophageal reflux disease without esophagitis: Secondary | ICD-10-CM | POA: Diagnosis not present

## 2017-01-20 DIAGNOSIS — F209 Schizophrenia, unspecified: Secondary | ICD-10-CM | POA: Diagnosis not present

## 2017-02-09 DIAGNOSIS — F419 Anxiety disorder, unspecified: Secondary | ICD-10-CM | POA: Diagnosis not present

## 2017-02-09 DIAGNOSIS — F209 Schizophrenia, unspecified: Secondary | ICD-10-CM | POA: Diagnosis not present

## 2017-02-10 DIAGNOSIS — F209 Schizophrenia, unspecified: Secondary | ICD-10-CM | POA: Diagnosis not present

## 2017-03-07 ENCOUNTER — Encounter: Payer: Self-pay | Admitting: Emergency Medicine

## 2017-03-07 ENCOUNTER — Emergency Department
Admission: EM | Admit: 2017-03-07 | Discharge: 2017-03-07 | Disposition: A | Discharge status: Home / Self Care | Payer: Medicare Other | Attending: Emergency Medicine | Admitting: Emergency Medicine

## 2017-03-07 ENCOUNTER — Other Ambulatory Visit: Payer: Self-pay

## 2017-03-07 DIAGNOSIS — I10 Essential (primary) hypertension: Secondary | ICD-10-CM | POA: Diagnosis not present

## 2017-03-07 DIAGNOSIS — R45851 Suicidal ideations: Secondary | ICD-10-CM | POA: Diagnosis not present

## 2017-03-07 DIAGNOSIS — F209 Schizophrenia, unspecified: Secondary | ICD-10-CM | POA: Diagnosis not present

## 2017-03-07 DIAGNOSIS — E119 Type 2 diabetes mellitus without complications: Secondary | ICD-10-CM | POA: Diagnosis not present

## 2017-03-07 DIAGNOSIS — Z79899 Other long term (current) drug therapy: Secondary | ICD-10-CM | POA: Diagnosis not present

## 2017-03-07 DIAGNOSIS — Z7984 Long term (current) use of oral hypoglycemic drugs: Secondary | ICD-10-CM | POA: Diagnosis not present

## 2017-03-07 DIAGNOSIS — Z87891 Personal history of nicotine dependence: Secondary | ICD-10-CM | POA: Insufficient documentation

## 2017-03-07 DIAGNOSIS — F418 Other specified anxiety disorders: Secondary | ICD-10-CM | POA: Diagnosis not present

## 2017-03-07 LAB — CBC
HCT: 40.1 % (ref 35.0–47.0)
Hemoglobin: 13.4 g/dL (ref 12.0–16.0)
MCH: 31.7 pg (ref 26.0–34.0)
MCHC: 33.5 g/dL (ref 32.0–36.0)
MCV: 94.4 fL (ref 80.0–100.0)
Platelets: 80 10*3/uL — ABNORMAL LOW (ref 150–440)
RBC: 4.24 MIL/uL (ref 3.80–5.20)
RDW: 13 % (ref 11.5–14.5)
WBC: 4.1 10*3/uL (ref 3.6–11.0)

## 2017-03-07 LAB — SALICYLATE LEVEL

## 2017-03-07 LAB — ETHANOL: Alcohol, Ethyl (B): <10 mg/dL (ref <10)

## 2017-03-07 LAB — COMPREHENSIVE METABOLIC PANEL
ALK PHOS: 89 U/L (ref 38–126)
ALT: 23 U/L (ref 14–54)
ANION GAP: 11 (ref 5–15)
AST: 30 U/L (ref 15–41)
Albumin: 4 g/dL (ref 3.5–5.0)
BUN: 8 mg/dL (ref 6–20)
CALCIUM: 7.7 mg/dL — AB (ref 8.9–10.3)
CHLORIDE: 100 mmol/L — AB (ref 101–111)
CO2: 25 mmol/L (ref 22–32)
Creatinine, Ser: 0.54 mg/dL (ref 0.44–1.00)
GFR calc non Af Amer: >60 mL/min (ref >60)
Glucose, Bld: 110 mg/dL — ABNORMAL HIGH (ref 65–99)
Potassium: 3.9 mmol/L (ref 3.5–5.1)
Sodium: 136 mmol/L (ref 135–145)
Total Bilirubin: 0.6 mg/dL (ref 0.3–1.2)
Total Protein: 8.2 g/dL — ABNORMAL HIGH (ref 6.5–8.1)

## 2017-03-07 LAB — URINE DRUG SCREEN, QUALITATIVE (ARMC ONLY)
Amphetamines, Ur Screen: NOT DETECTED
BARBITURATES, UR SCREEN: NOT DETECTED
BENZODIAZEPINE, UR SCRN: NOT DETECTED
COCAINE METABOLITE, UR ~~LOC~~: NOT DETECTED
Cannabinoid 50 Ng, Ur ~~LOC~~: NOT DETECTED
MDMA (Ecstasy)Ur Screen: NOT DETECTED
METHADONE SCREEN, URINE: NOT DETECTED
Opiate, Ur Screen: NOT DETECTED
Phencyclidine (PCP) Ur S: NOT DETECTED
TRICYCLIC, UR SCREEN: NOT DETECTED

## 2017-03-07 LAB — ACETAMINOPHEN LEVEL

## 2017-03-07 MED ORDER — LURASIDONE HCL 40 MG PO TABS: 40.0000 mg | ORAL_TABLET | Freq: Every day | ORAL | Status: DC

## 2017-03-07 MED ORDER — LURASIDONE HCL 40 MG PO TABS
40.0000 mg | ORAL_TABLET | ORAL | Status: AC
  Administered 2017-03-07: 40 mg via ORAL
  Filled 2017-03-07: qty 1

## 2017-03-07 NOTE — ED Notes (Signed)
Gave report to [employer] RN

## 2017-03-07 NOTE — ED Notes (Signed)
Pt dressed out per this RN and Anda Kraft, Therapist, sports.  Belongings (clothing, shoes) labeled and handed off to [place], Therapist, sports.

## 2017-03-07 NOTE — ED Notes (Signed)
Group Home phone number; [phone]

## 2017-03-07 NOTE — ED Notes (Signed)
Patient is IVC

## 2017-03-07 NOTE — ED Notes (Signed)
[place] at [phone].  Phone rang numerous times and then fax came on.

## 2017-03-07 NOTE — ED Notes (Signed)
Spoke with Alvis [phone]) at [place].  He states he is going to "try" to find transportation for patient.

## 2017-03-07 NOTE — ED Provider Notes (Signed)
-----------------------------------------   3:33 PM on 03/07/2017 -----------------------------------------  A she is now denying suicidal ideation will let her go as planned with tele-psychiatry.   Nena Polio, MD 03/07/17 [phone]

## 2017-03-07 NOTE — BH Assessment (Signed)
Writer called Group Home but was unable to reach anyone.  The [phone] rings busy and the [phone] number would not allow writer to leave a voicemail message. "The mailbox wasn't set up.

## 2017-03-07 NOTE — ED Provider Notes (Signed)
[facility] Emergency Department Provider Note   ____________________________________________   First MD Initiated Contact with Patient 03/07/17 1221     (approximate)  I have reviewed the triage vital signs and the nursing notes.   HISTORY  Chief Complaint Suicidal   HPI Jessica [name] is a 52 y.o. female Who has a history of schizophrenia. She says she is feeling suicidal and wants to kill herself by cutting herself. She has had these feelings before. She has diabetes and hypertension and other past medical history but nothing is bothering her right now except for the depression or suicidal ideation.   Past Medical History:  Diagnosis Date  . Anxiety   . Depression   . Diabetes mellitus without complication ([place])   . GERD (gastroesophageal reflux disease)   . Hypertension     Patient Active Problem List   Diagnosis Date Noted  . Schizophrenia ([place]) 01/02/2017    History reviewed. No pertinent surgical history.  Prior to Admission medications   Medication Sig Start Date End Date Taking? Authorizing Provider  acetaminophen (TYLENOL) 325 MG tablet Take 2 tablets (650 mg total) by mouth every 6 (six) hours as needed for mild pain or headache. 01/15/17   Lindell Spar I, NP  benztropine (COGENTIN) 1 MG tablet Take 1 tablet (1 mg total) by mouth 2 (two) times daily. For prevention of drug induced tremors 01/15/17   Lindell Spar I, NP  carvedilol (COREG) 6.25 MG tablet Take 1 tablet (6.25 mg total) by mouth 2 (two) times daily. For high blood pressure 01/15/17   Nwoko, Herbert Pun I, NP  DULoxetine (CYMBALTA) 60 MG capsule Take 1 capsule (60 mg total) by mouth daily. For depression 01/16/17   Lindell Spar I, NP  ferrous sulfate (FEROSUL) 325 (65 FE) MG tablet Take 1 tablet (325 mg total) by mouth 2 (two) times daily. For anemia 01/15/17   Lindell Spar I, NP  gabapentin (NEURONTIN) 600 MG tablet Take 1 tablet (600 mg total) by mouth 3 (three) times daily. For  agitation 01/15/17   Lindell Spar I, NP  hydrOXYzine (ATARAX/VISTARIL) 25 MG tablet Take 1 tablet (25 mg total) by mouth 3 (three) times daily as needed for anxiety. 01/15/17   Lindell Spar I, NP  lamoTRIgine (LAMICTAL) 100 MG tablet Take 1 tablet (100 mg total) by mouth at bedtime. For mood stabilization 01/15/17   Nwoko, Herbert Pun I, NP  LEVEMIR FLEXTOUCH 100 UNIT/ML Pen INJECT 30 UNITS UNDER SKIN EVERY TWELVE HOURS: For diabetes managment 01/15/17   Lindell Spar I, NP  levothyroxine (SYNTHROID, LEVOTHROID) 150 MCG tablet Take 1 tablet (150 mcg total) by mouth daily. For thyroid function hormone replacement 01/15/17   Lindell Spar I, NP  lisinopril (PRINIVIL,ZESTRIL) 10 MG tablet Take 1 tablet (10 mg total) by mouth daily. For high blood pressure 01/16/17   Nwoko, Herbert Pun I, NP  loratadine (CLARITIN) 10 MG tablet Take 1 tablet (10 mg total) by mouth daily. For allergies 01/16/17   Lindell Spar I, NP  lurasidone (LATUDA) 80 MG TABS tablet Take 1 tablet (80 mg total) by mouth 2 (two) times daily after a meal. For mood control 01/15/17   Lindell Spar I, NP  metFORMIN (GLUCOPHAGE) 500 MG tablet Take 1 tablet (500 mg total) by mouth 2 (two) times daily. For diabetes management 01/15/17   Lindell Spar I, NP  OS-CAL CALCIUM + D3 500-200 MG-UNIT TABS Take 1 tablet by mouth 2 (two) times daily. 01/15/17   Encarnacion Slates, NP  pantoprazole (PROTONIX) 40 MG tablet Take 1 tablet (40 mg total) by mouth daily. For acid reflux 01/16/17   Nwoko, Herbert Pun I, NP  RESTASIS 0.05 % ophthalmic emulsion INSTILL ONE DROP IN AFFECTED EYE TWICE DAILY AS DIRECTED: eye irritation 01/15/17   Lindell Spar I, NP  traZODone (DESYREL) 100 MG tablet Take 1 tablet (100 mg total) by mouth at bedtime as needed for sleep. 01/15/17   Lindell Spar I, NP  valACYclovir (VALTREX) 500 MG tablet Take 1 tablet (500 mg total) by mouth daily. For herpes simplex infection 01/15/17   Encarnacion Slates, NP    Allergies Abilify [aripiprazole]; Haldol  [haloperidol]; and Risperdal [risperidone]  History reviewed. No pertinent family history.  Social History Social History   Tobacco Use  . Smoking status: Former Research scientist (life sciences)  . Smokeless tobacco: Never Used  Substance Use Topics  . Alcohol use: No    Frequency: Never  . Drug use: Yes    Types: Marijuana, "Crack" cocaine    Comment: reports she has not done anything in a long time    Review of Systems  Constitutional: No fever/chills Eyes: No visual changes. ENT: No sore throat. Cardiovascular: Denies chest pain. Respiratory: Denies shortness of breath. Gastrointestinal: No abdominal pain.  No nausea, no vomiting.  No diarrhea.  No constipation. Genitourinary: Negative for dysuria. Musculoskeletal: Negative for back pain. Skin: Negative for rash. Neurological: Negative for headaches, focal weakness   ____________________________________________   PHYSICAL EXAM:  VITAL SIGNS: ED Triage Vitals [03/07/17 1148]  Enc Vitals Group     BP 131/77     Pulse Rate 77     Resp 16     Temp 99.2 F (37.3 C)     Temp Source Oral     SpO2 96 %     Weight 182 lb (82.6 kg)     Height 4\' 11"  (1.499 m)     Head Circumference      Peak Flow      Pain Score      Pain Loc      Pain Edu?      Excl. in [place]?     Constitutional: Alert and oriented. Well appearing and in no acute distress. Eyes: Conjunctivae are normal. . Head: Atraumatic. Nose: No congestion/rhinnorhea. Mouth/Throat: Mucous membranes are moist.  Oropharynx non-erythematous. Neck: No stridor.   Cardiovascular: Normal rate, regular rhythm. Grossly normal heart sounds.  Good peripheral circulation. Respiratory: Normal respiratory effort.  No retractions. Lungs CTAB. Gastrointestinal: Soft and nontender. No distention. No abdominal bruits. No CVA tenderness. Musculoskeletal: No lower extremity tenderness nor edema.   Neurologic:  Normal speech and language. No gross focal neurologic deficits are appreciated.  Skin:  Skin  is warm, dry and intact. No rash noted.   ____________________________________________   LABS (all labs ordered are listed, but only abnormal results are displayed)  Labs Reviewed  COMPREHENSIVE METABOLIC PANEL - Abnormal; Notable for the following components:      Result Value   Chloride 100 (*)    Glucose, Bld 110 (*)    Calcium 7.7 (*)    Total Protein 8.2 (*)    All other components within normal limits  ACETAMINOPHEN LEVEL - Abnormal; Notable for the following components:   Acetaminophen (Tylenol), Serum <10 (*)    All other components within normal limits  CBC - Abnormal; Notable for the following components:   Platelets 80 (*)    All other components within normal limits  ETHANOL  SALICYLATE LEVEL  URINE DRUG  SCREEN, QUALITATIVE ([name] ONLY)  BLOOD GAS, VENOUS   ____________________________________________  EKG   ____________________________________________  RADIOLOGY  ED MD interpretation:    Official radiology report(s): No results found.  ____________________________________________   PROCEDURES  Procedure(s) performed:   Procedures  Critical Care performed:   ____________________________________________   INITIAL IMPRESSION / ASSESSMENT AND PLAN / ED COURSE  patient's old records are reviewed she does have a history of schizophrenia. She has had aggressive behavior in the past but is not acting aggressively present. We will consult TTS and psychiatry.  ----------------------------------------- 2:42 PM on 03/07/2017 -----------------------------------------  Tele-psychiatry calls back and says that when they spoke with her she is no longer suicidal. They suggests giving her 40 with 2 to by mouth and seeing if she still not suicidal discharge her. Tell psychiatry thinks that the patient was probably stressed out by her visit to the church which she hadn't been to for a while and that may have triggered her feelings.        ____________________________________________   FINAL CLINICAL IMPRESSION(S) / ED DIAGNOSES  Final diagnoses:  Suicidal ideation   final diagnosis is suicidal ideation resolved suicidal ideation likely brought on by stress  ED Discharge Orders    None       Note:  This document was prepared using Dragon voice recognition software and may include unintentional dictation errors.    Nena Polio, MD 03/07/17 [phone]

## 2017-03-07 NOTE — ED Triage Notes (Signed)
Pt in via [place] [employer] from church; pt reports suicidal throughts x 3 days, plan to cut herself.  Pt reports hx of schizophrenia.  Pt from [place] but unable to recall name of home at this time.  NAD noted at this time.

## 2017-03-07 NOTE — ED Notes (Signed)
Preparing patient for discharge. 

## 2017-03-07 NOTE — ED Notes (Signed)
Pt denies SI/HI/AVH. Pt given discharge instructions. Pt states understanding. Pt states receipt of all belongings.   
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
## 2017-03-07 NOTE — Discharge Instructions (Signed)
tinea medicines. Please be sure to see your psychiatrist tomorrow as scheduled. Return for any further thoughts of hurting herself.

## 2017-03-07 NOTE — ED Notes (Signed)
Patient denies SI.

## 2017-05-05 DIAGNOSIS — F209 Schizophrenia, unspecified: Secondary | ICD-10-CM | POA: Diagnosis not present

## 2017-05-13 DIAGNOSIS — F419 Anxiety disorder, unspecified: Secondary | ICD-10-CM | POA: Diagnosis not present

## 2017-05-13 DIAGNOSIS — F209 Schizophrenia, unspecified: Secondary | ICD-10-CM | POA: Diagnosis not present

## 2017-05-14 DIAGNOSIS — K219 Gastro-esophageal reflux disease without esophagitis: Secondary | ICD-10-CM | POA: Diagnosis not present

## 2017-05-14 DIAGNOSIS — R739 Hyperglycemia, unspecified: Secondary | ICD-10-CM | POA: Diagnosis not present

## 2017-05-14 DIAGNOSIS — E032 Hypothyroidism due to medicaments and other exogenous substances: Secondary | ICD-10-CM | POA: Diagnosis not present

## 2017-05-14 DIAGNOSIS — F209 Schizophrenia, unspecified: Secondary | ICD-10-CM | POA: Diagnosis not present

## 2017-05-14 DIAGNOSIS — I10 Essential (primary) hypertension: Secondary | ICD-10-CM | POA: Diagnosis not present

## 2017-05-14 DIAGNOSIS — D509 Iron deficiency anemia, unspecified: Secondary | ICD-10-CM | POA: Diagnosis not present

## 2017-05-20 DIAGNOSIS — F419 Anxiety disorder, unspecified: Secondary | ICD-10-CM | POA: Diagnosis not present

## 2017-05-20 DIAGNOSIS — F209 Schizophrenia, unspecified: Secondary | ICD-10-CM | POA: Diagnosis not present

## 2017-05-27 DIAGNOSIS — F209 Schizophrenia, unspecified: Secondary | ICD-10-CM | POA: Diagnosis not present

## 2017-05-27 DIAGNOSIS — F419 Anxiety disorder, unspecified: Secondary | ICD-10-CM | POA: Diagnosis not present

## 2017-06-10 DIAGNOSIS — E113413 Type 2 diabetes mellitus with severe nonproliferative diabetic retinopathy with macular edema, bilateral: Secondary | ICD-10-CM | POA: Diagnosis not present

## 2017-06-11 DIAGNOSIS — F209 Schizophrenia, unspecified: Secondary | ICD-10-CM | POA: Diagnosis not present

## 2017-06-11 DIAGNOSIS — F419 Anxiety disorder, unspecified: Secondary | ICD-10-CM | POA: Diagnosis not present

## 2017-06-17 DIAGNOSIS — F209 Schizophrenia, unspecified: Secondary | ICD-10-CM | POA: Diagnosis not present

## 2017-06-17 DIAGNOSIS — F419 Anxiety disorder, unspecified: Secondary | ICD-10-CM | POA: Diagnosis not present

## 2017-06-24 DIAGNOSIS — E113412 Type 2 diabetes mellitus with severe nonproliferative diabetic retinopathy with macular edema, left eye: Secondary | ICD-10-CM | POA: Diagnosis not present

## 2017-06-30 DIAGNOSIS — R45850 Homicidal ideations: Secondary | ICD-10-CM | POA: Diagnosis not present

## 2017-06-30 DIAGNOSIS — F418 Other specified anxiety disorders: Secondary | ICD-10-CM | POA: Diagnosis not present

## 2017-06-30 DIAGNOSIS — I10 Essential (primary) hypertension: Secondary | ICD-10-CM | POA: Diagnosis not present

## 2017-06-30 DIAGNOSIS — Z79899 Other long term (current) drug therapy: Secondary | ICD-10-CM | POA: Diagnosis not present

## 2017-06-30 DIAGNOSIS — R45851 Suicidal ideations: Secondary | ICD-10-CM | POA: Diagnosis not present

## 2017-06-30 DIAGNOSIS — K219 Gastro-esophageal reflux disease without esophagitis: Secondary | ICD-10-CM | POA: Diagnosis not present

## 2017-06-30 DIAGNOSIS — M1990 Unspecified osteoarthritis, unspecified site: Secondary | ICD-10-CM | POA: Diagnosis not present

## 2017-07-02 DIAGNOSIS — F419 Anxiety disorder, unspecified: Secondary | ICD-10-CM | POA: Diagnosis not present

## 2017-07-02 DIAGNOSIS — F209 Schizophrenia, unspecified: Secondary | ICD-10-CM | POA: Diagnosis not present

## 2017-07-09 ENCOUNTER — Other Ambulatory Visit: Payer: Self-pay

## 2017-07-09 ENCOUNTER — Emergency Department
Admission: EM | Admit: 2017-07-09 | Discharge: 2017-07-10 | Disposition: A | Discharge status: Home / Self Care | Payer: Medicare Other | Attending: Emergency Medicine | Admitting: Emergency Medicine

## 2017-07-09 DIAGNOSIS — F419 Anxiety disorder, unspecified: Secondary | ICD-10-CM | POA: Diagnosis not present

## 2017-07-09 DIAGNOSIS — Y929 Unspecified place or not applicable: Secondary | ICD-10-CM | POA: Diagnosis not present

## 2017-07-09 DIAGNOSIS — S62232A Other displaced fracture of base of first metacarpal bone, left hand, initial encounter for closed fracture: Secondary | ICD-10-CM | POA: Diagnosis not present

## 2017-07-09 DIAGNOSIS — F4320 Adjustment disorder, unspecified: Secondary | ICD-10-CM | POA: Diagnosis not present

## 2017-07-09 DIAGNOSIS — E119 Type 2 diabetes mellitus without complications: Secondary | ICD-10-CM | POA: Insufficient documentation

## 2017-07-09 DIAGNOSIS — Y999 Unspecified external cause status: Secondary | ICD-10-CM | POA: Insufficient documentation

## 2017-07-09 DIAGNOSIS — Z794 Long term (current) use of insulin: Secondary | ICD-10-CM | POA: Diagnosis not present

## 2017-07-09 DIAGNOSIS — Z8659 Personal history of other mental and behavioral disorders: Secondary | ICD-10-CM | POA: Insufficient documentation

## 2017-07-09 DIAGNOSIS — S62235A Other nondisplaced fracture of base of first metacarpal bone, left hand, initial encounter for closed fracture: Secondary | ICD-10-CM | POA: Diagnosis not present

## 2017-07-09 DIAGNOSIS — I10 Essential (primary) hypertension: Secondary | ICD-10-CM | POA: Diagnosis not present

## 2017-07-09 DIAGNOSIS — Z046 Encounter for general psychiatric examination, requested by authority: Secondary | ICD-10-CM | POA: Diagnosis present

## 2017-07-09 DIAGNOSIS — Z87891 Personal history of nicotine dependence: Secondary | ICD-10-CM | POA: Insufficient documentation

## 2017-07-09 DIAGNOSIS — F209 Schizophrenia, unspecified: Secondary | ICD-10-CM | POA: Diagnosis not present

## 2017-07-09 DIAGNOSIS — Y939 Activity, unspecified: Secondary | ICD-10-CM | POA: Diagnosis not present

## 2017-07-09 DIAGNOSIS — F4325 Adjustment disorder with mixed disturbance of emotions and conduct: Secondary | ICD-10-CM | POA: Diagnosis not present

## 2017-07-09 DIAGNOSIS — X58XXXA Exposure to other specified factors, initial encounter: Secondary | ICD-10-CM | POA: Diagnosis not present

## 2017-07-09 DIAGNOSIS — Z79899 Other long term (current) drug therapy: Secondary | ICD-10-CM | POA: Diagnosis not present

## 2017-07-09 LAB — COMPREHENSIVE METABOLIC PANEL
ALBUMIN: 4.3 g/dL (ref 3.5–5.0)
ALT: 23 U/L (ref 14–54)
AST: 28 U/L (ref 15–41)
Alkaline Phosphatase: 87 U/L (ref 38–126)
Anion gap: 10 (ref 5–15)
BUN: 9 mg/dL (ref 6–20)
CHLORIDE: 99 mmol/L — AB (ref 101–111)
CO2: 29 mmol/L (ref 22–32)
Calcium: 8.1 mg/dL — ABNORMAL LOW (ref 8.9–10.3)
Creatinine, Ser: 0.55 mg/dL (ref 0.44–1.00)
GFR calc Af Amer: >60 mL/min (ref >60)
GFR calc non Af Amer: >60 mL/min (ref >60)
Glucose, Bld: 147 mg/dL — ABNORMAL HIGH (ref 65–99)
POTASSIUM: 3.9 mmol/L (ref 3.5–5.1)
Sodium: 138 mmol/L (ref 135–145)
Total Bilirubin: 0.7 mg/dL (ref 0.3–1.2)
Total Protein: 8.3 g/dL — ABNORMAL HIGH (ref 6.5–8.1)

## 2017-07-09 LAB — CBC
HCT: 41.2 % (ref 35.0–47.0)
Hemoglobin: 13.5 g/dL (ref 12.0–16.0)
MCH: 30.4 pg (ref 26.0–34.0)
MCHC: 32.9 g/dL (ref 32.0–36.0)
MCV: 92.5 fL (ref 80.0–100.0)
PLATELETS: 86 10*3/uL — AB (ref 150–440)
RBC: 4.46 MIL/uL (ref 3.80–5.20)
RDW: 14 % (ref 11.5–14.5)
WBC: 5.9 10*3/uL (ref 3.6–11.0)

## 2017-07-09 LAB — URINE DRUG SCREEN, QUALITATIVE (ARMC ONLY)
AMPHETAMINES, UR SCREEN: NOT DETECTED
Benzodiazepine, Ur Scrn: NOT DETECTED
Cannabinoid 50 Ng, Ur ~~LOC~~: NOT DETECTED
Cocaine Metabolite,Ur ~~LOC~~: NOT DETECTED
MDMA (ECSTASY) UR SCREEN: NOT DETECTED
METHADONE SCREEN, URINE: NOT DETECTED
Opiate, Ur Screen: NOT DETECTED
PHENCYCLIDINE (PCP) UR S: NOT DETECTED
Tricyclic, Ur Screen: NOT DETECTED

## 2017-07-09 LAB — ETHANOL

## 2017-07-09 LAB — SALICYLATE LEVEL: Salicylate Lvl: <7 mg/dL (ref 2.8–30.0)

## 2017-07-09 LAB — ACETAMINOPHEN LEVEL: Acetaminophen (Tylenol), Serum: <10 ug/mL — ABNORMAL LOW (ref 10–30)

## 2017-07-09 NOTE — ED Notes (Signed)
Patient received PM snack. 

## 2017-07-09 NOTE — ED Notes (Signed)

## 2017-07-09 NOTE — ED Provider Notes (Signed)
[facility] Emergency Department Provider Note  ____________________________________________   I have reviewed the triage vital signs and the nursing notes. Where available I have reviewed prior notes and, if possible and indicated, outside hospital notes.    HISTORY  Chief Complaint Psychiatric Evaluation    HPI Jessica [name] is a 52 y.o. female who presents today complaining of wanting to hurt herself and other people.  She is unhappy at her group home.  She did not actually take an overdose but she would if she could she states.  She has no other complaints.  She is been feeling this way for a few days nothing makes it better nothing makes it worse no other associated symptoms no prior treatment    Past Medical History:  Diagnosis Date  . Anxiety   . Depression   . Diabetes mellitus without complication ([place])   . GERD (gastroesophageal reflux disease)   . Hypertension     Patient Active Problem List   Diagnosis Date Noted  . Schizophrenia ([place]) 01/02/2017    History reviewed. No pertinent surgical history.  Prior to Admission medications   Medication Sig Start Date End Date Taking? Authorizing Provider  acetaminophen (TYLENOL) 325 MG tablet Take 2 tablets (650 mg total) by mouth every 6 (six) hours as needed for mild pain or headache. 01/15/17   Lindell Spar I, NP  benztropine (COGENTIN) 1 MG tablet Take 1 tablet (1 mg total) by mouth 2 (two) times daily. For prevention of drug induced tremors 01/15/17   Lindell Spar I, NP  carvedilol (COREG) 6.25 MG tablet Take 1 tablet (6.25 mg total) by mouth 2 (two) times daily. For high blood pressure 01/15/17   Nwoko, Herbert Pun I, NP  DULoxetine (CYMBALTA) 60 MG capsule Take 1 capsule (60 mg total) by mouth daily. For depression 01/16/17   Lindell Spar I, NP  ferrous sulfate (FEROSUL) 325 (65 FE) MG tablet Take 1 tablet (325 mg total) by mouth 2 (two) times daily. For anemia 01/15/17   Lindell Spar I, NP   gabapentin (NEURONTIN) 600 MG tablet Take 1 tablet (600 mg total) by mouth 3 (three) times daily. For agitation 01/15/17   Lindell Spar I, NP  hydrOXYzine (ATARAX/VISTARIL) 25 MG tablet Take 1 tablet (25 mg total) by mouth 3 (three) times daily as needed for anxiety. 01/15/17   Lindell Spar I, NP  lamoTRIgine (LAMICTAL) 100 MG tablet Take 1 tablet (100 mg total) by mouth at bedtime. For mood stabilization 01/15/17   Nwoko, Herbert Pun I, NP  LEVEMIR FLEXTOUCH 100 UNIT/ML Pen INJECT 30 UNITS UNDER SKIN EVERY TWELVE HOURS: For diabetes managment 01/15/17   Lindell Spar I, NP  levothyroxine (SYNTHROID, LEVOTHROID) 150 MCG tablet Take 1 tablet (150 mcg total) by mouth daily. For thyroid function hormone replacement 01/15/17   Lindell Spar I, NP  lisinopril (PRINIVIL,ZESTRIL) 10 MG tablet Take 1 tablet (10 mg total) by mouth daily. For high blood pressure 01/16/17   Nwoko, Herbert Pun I, NP  loratadine (CLARITIN) 10 MG tablet Take 1 tablet (10 mg total) by mouth daily. For allergies 01/16/17   Lindell Spar I, NP  lurasidone (LATUDA) 80 MG TABS tablet Take 1 tablet (80 mg total) by mouth 2 (two) times daily after a meal. For mood control 01/15/17   Lindell Spar I, NP  metFORMIN (GLUCOPHAGE) 500 MG tablet Take 1 tablet (500 mg total) by mouth 2 (two) times daily. For diabetes management 01/15/17   Encarnacion Slates, NP  OS-CAL CALCIUM +  D3 500-200 MG-UNIT TABS Take 1 tablet by mouth 2 (two) times daily. 01/15/17   Lindell Spar I, NP  pantoprazole (PROTONIX) 40 MG tablet Take 1 tablet (40 mg total) by mouth daily. For acid reflux 01/16/17   Nwoko, Herbert Pun I, NP  RESTASIS 0.05 % ophthalmic emulsion INSTILL ONE DROP IN AFFECTED EYE TWICE DAILY AS DIRECTED: eye irritation 01/15/17   Lindell Spar I, NP  traZODone (DESYREL) 100 MG tablet Take 1 tablet (100 mg total) by mouth at bedtime as needed for sleep. 01/15/17   Lindell Spar I, NP  valACYclovir (VALTREX) 500 MG tablet Take 1 tablet (500 mg total) by mouth daily. For herpes  simplex infection 01/15/17   Encarnacion Slates, NP    Allergies Abilify [aripiprazole]; Haldol [haloperidol]; and Risperdal [risperidone]  No family history on file.  Social History Social History   Tobacco Use  . Smoking status: Former Research scientist (life sciences)  . Smokeless tobacco: Never Used  Substance Use Topics  . Alcohol use: No    Frequency: Never  . Drug use: Yes    Types: Marijuana, "Crack" cocaine    Comment: reports she has not done anything in a long time    Review of Systems Constitutional: No fever/chills Eyes: No visual changes. ENT: No sore throat. No stiff neck no neck pain Cardiovascular: Denies chest pain. Respiratory: Denies shortness of breath. Gastrointestinal:   no vomiting.  No diarrhea.  No constipation. Genitourinary: Negative for dysuria. Musculoskeletal: Negative lower extremity swelling Skin: Negative for rash. Neurological: Negative for severe headaches, focal weakness or numbness.   ____________________________________________   PHYSICAL EXAM:  VITAL SIGNS: ED Triage Vitals [07/09/17 1641]  Enc Vitals Group     BP (!) 163/81     Pulse Rate 87     Resp 18     Temp 98.9 F (37.2 C)     Temp Source Oral     SpO2 95 %     Weight 160 lb (72.6 kg)     Height 4\' 11"  (1.499 m)     Head Circumference      Peak Flow      Pain Score 0     Pain Loc      Pain Edu?      Excl. in [place]?     Constitutional: Alert and oriented. Well appearing and in no acute distress. Eyes: Conjunctivae are normal Head: Atraumatic HEENT: No congestion/rhinnorhea. Mucous membranes are moist.  Oropharynx non-erythematous Neck:   Nontender with no meningismus, no masses, no stridor Cardiovascular: Normal rate, regular rhythm. Grossly normal heart sounds.  Good peripheral circulation. Respiratory: Normal respiratory effort.  No retractions. Lungs CTAB. Abdominal: Soft and nontender. No distention. No guarding no rebound Back:  There is no focal tenderness or step off.  there is no  midline tenderness there are no lesions noted. there is no CVA tenderness} Musculoskeletal: No lower extremity tenderness, no upper extremity tenderness. No joint effusions, no DVT signs strong distal pulses no edema Neurologic:  Normal speech and language. No gross focal neurologic deficits are appreciated.  Skin:  Skin is warm, dry and intact. No rash noted. Psychiatric: Mood and affect are somewhat flattened and her affect. Speech and behavior are normal.  ____________________________________________   LABS (all labs ordered are listed, but only abnormal results are displayed)  Labs Reviewed  CBC - Abnormal; Notable for the following components:      Result Value   Platelets 86 (*)    All other components within normal limits  COMPREHENSIVE METABOLIC PANEL  ETHANOL  SALICYLATE LEVEL  ACETAMINOPHEN LEVEL  URINE DRUG SCREEN, QUALITATIVE ([place])    Pertinent labs  results that were available during my care of the patient were reviewed by me and considered in my medical decision making (see chart for details). ____________________________________________  EKG  I personally interpreted any EKGs ordered by me or triage  ____________________________________________  RADIOLOGY  Pertinent labs & imaging results that were available during my care of the patient were reviewed by me and considered in my medical decision making (see chart for details). If possible, patient and/or family made aware of any abnormal findings.  No results found. ____________________________________________    PROCEDURES  Procedure(s) performed: None  Procedures  Critical Care performed: None  ____________________________________________   INITIAL IMPRESSION / ASSESSMENT AND PLAN / ED COURSE  Pertinent labs & imaging results that were available during my care of the patient were reviewed by me and considered in my medical decision making (see chart for details).  Patient here because she  has suicidal thoughts she is under IVC we will continue IVC until she is cleared by psychiatry.    ____________________________________________   FINAL CLINICAL IMPRESSION(S) / ED DIAGNOSES  Final diagnoses:  None      This chart was dictated using voice recognition software.  Despite best efforts to proofread,  errors can occur which can change meaning.      Schuyler Amor, MD 07/09/17 [phone]
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
## 2017-07-09 NOTE — ED Notes (Signed)
Pt asked this RN to call her sister [name]. Pt stated she is "in charge" of her affairs. Verbal permission given to leave [name] a message on voicemail to return phone call. Pt knows phone number.

## 2017-07-09 NOTE — ED Notes (Signed)
Amy, RN aware that patient in bed. Tommy EDT sitting 1:1. Charge RN aware.  belongings include shirt, pants, shoes, socks and bra. Pt has her own glasses.
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
## 2017-07-09 NOTE — ED Notes (Signed)
Pt. Moved from [facility] to [place].
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
## 2017-07-10 ENCOUNTER — Emergency Department: Payer: Medicare Other

## 2017-07-10 DIAGNOSIS — F4325 Adjustment disorder with mixed disturbance of emotions and conduct: Secondary | ICD-10-CM | POA: Diagnosis not present

## 2017-07-10 DIAGNOSIS — S62235A Other nondisplaced fracture of base of first metacarpal bone, left hand, initial encounter for closed fracture: Secondary | ICD-10-CM | POA: Diagnosis not present

## 2017-07-10 LAB — GLUCOSE, CAPILLARY: GLUCOSE-CAPILLARY: 137 mg/dL — AB (ref 65–99)

## 2017-07-10 MED ORDER — DULOXETINE HCL 60 MG PO CPEP
60.0000 mg | ORAL_CAPSULE | Freq: Every day | ORAL | Status: DC
  Administered 2017-07-10: 60 mg via ORAL
  Filled 2017-07-10: qty 1

## 2017-07-10 MED ORDER — METFORMIN HCL 500 MG PO TABS
500.0000 mg | ORAL_TABLET | Freq: Two times a day (BID) | ORAL | Status: DC
  Administered 2017-07-10: 500 mg via ORAL
  Filled 2017-07-10: qty 1

## 2017-07-10 MED ORDER — LISINOPRIL 5 MG PO TABS
10.0000 mg | ORAL_TABLET | Freq: Every day | ORAL | Status: DC
  Administered 2017-07-10: 10 mg via ORAL
  Filled 2017-07-10: qty 2

## 2017-07-10 MED ORDER — PANTOPRAZOLE SODIUM 40 MG PO TBEC
40.0000 mg | DELAYED_RELEASE_TABLET | Freq: Every day | ORAL | Status: DC
  Administered 2017-07-10: 40 mg via ORAL
  Filled 2017-07-10: qty 1

## 2017-07-10 MED ORDER — POLYETHYLENE GLYCOL 3350 17 G PO PACK
17.0000 g | PACK | Freq: Every day | ORAL | Status: DC
  Administered 2017-07-10: 17 g via ORAL
  Filled 2017-07-10: qty 1

## 2017-07-10 MED ORDER — IBUPROFEN 800 MG PO TABS
800.0000 mg | ORAL_TABLET | Freq: Once | ORAL | Status: AC
  Administered 2017-07-10: 800 mg via ORAL
  Filled 2017-07-10: qty 1

## 2017-07-10 MED ORDER — LAMOTRIGINE 100 MG PO TABS
100.0000 mg | ORAL_TABLET | Freq: Every day | ORAL | Status: DC
  Administered 2017-07-10: 100 mg via ORAL
  Filled 2017-07-10: qty 1

## 2017-07-10 MED ORDER — LORATADINE 10 MG PO TABS
10.0000 mg | ORAL_TABLET | Freq: Every day | ORAL | Status: DC
  Administered 2017-07-10: 10 mg via ORAL
  Filled 2017-07-10: qty 1

## 2017-07-10 MED ORDER — TRAZODONE HCL 100 MG PO TABS
100.0000 mg | ORAL_TABLET | Freq: Every evening | ORAL | Status: DC | PRN
  Administered 2017-07-10: 100 mg via ORAL
  Filled 2017-07-10: qty 1

## 2017-07-10 MED ORDER — FERROUS SULFATE 325 (65 FE) MG PO TABS
325.0000 mg | ORAL_TABLET | Freq: Two times a day (BID) | ORAL | Status: DC
  Administered 2017-07-10 (×2): 325 mg via ORAL
  Filled 2017-07-10 (×3): qty 1

## 2017-07-10 MED ORDER — LEVOTHYROXINE SODIUM 75 MCG PO TABS
150.0000 ug | ORAL_TABLET | ORAL | Status: DC
  Administered 2017-07-10: 150 ug via ORAL
  Filled 2017-07-10: qty 2

## 2017-07-10 MED ORDER — INSULIN DETEMIR 100 UNIT/ML ~~LOC~~ SOLN
15.0000 [IU] | Freq: Two times a day (BID) | SUBCUTANEOUS | Status: DC
  Administered 2017-07-10 (×2): 15 [IU] via SUBCUTANEOUS
  Filled 2017-07-10 (×4): qty 0.15

## 2017-07-10 MED ORDER — NAPROXEN 500 MG PO TABS: 500.0000 mg | ORAL_TABLET | Freq: Two times a day (BID) | ORAL | 2 refills | Status: DC

## 2017-07-10 MED ORDER — CARVEDILOL 6.25 MG PO TABS
6.2500 mg | ORAL_TABLET | Freq: Two times a day (BID) | ORAL | Status: DC
  Administered 2017-07-10 (×2): 6.25 mg via ORAL
  Filled 2017-07-10 (×3): qty 1

## 2017-07-10 MED ORDER — LURASIDONE HCL 40 MG PO TABS
80.0000 mg | ORAL_TABLET | Freq: Every day | ORAL | Status: DC
  Administered 2017-07-10: 80 mg via ORAL
  Filled 2017-07-10: qty 2
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
## 2017-07-10 NOTE — ED Notes (Signed)
Patient in shower 

## 2017-07-10 NOTE — ED Notes (Signed)
Patient talked with SOC.

## 2017-07-10 NOTE — ED Notes (Signed)
Patient alert and oriented and ambulatory at discharge. Patient discharge home with group home staff. Patient AVS reviewed with her and she verbalized understanding. Patient given written copy. All patient belongings returned to her. Patient escorted to lobby by this Probation officer.
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
## 2017-07-10 NOTE — ED Notes (Signed)
[place] machine set up for patient.

## 2017-07-10 NOTE — ED Notes (Signed)
Patient breakfast meal given to her.
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
## 2017-07-10 NOTE — ED Notes (Signed)
Patient lunch meal given to her.

## 2017-07-21 ENCOUNTER — Encounter: Payer: Self-pay | Admitting: Emergency Medicine

## 2017-07-21 ENCOUNTER — Emergency Department
Admission: EM | Admit: 2017-07-21 | Discharge: 2017-07-21 | Disposition: A | Discharge status: Home / Self Care | Payer: Medicare Other | Attending: Emergency Medicine | Admitting: Emergency Medicine

## 2017-07-21 ENCOUNTER — Other Ambulatory Visit: Payer: Self-pay

## 2017-07-21 DIAGNOSIS — E119 Type 2 diabetes mellitus without complications: Secondary | ICD-10-CM | POA: Diagnosis not present

## 2017-07-21 DIAGNOSIS — F4325 Adjustment disorder with mixed disturbance of emotions and conduct: Secondary | ICD-10-CM | POA: Diagnosis not present

## 2017-07-21 DIAGNOSIS — F209 Schizophrenia, unspecified: Secondary | ICD-10-CM | POA: Diagnosis not present

## 2017-07-21 DIAGNOSIS — I10 Essential (primary) hypertension: Secondary | ICD-10-CM | POA: Diagnosis not present

## 2017-07-21 DIAGNOSIS — Z7984 Long term (current) use of oral hypoglycemic drugs: Secondary | ICD-10-CM | POA: Diagnosis not present

## 2017-07-21 DIAGNOSIS — Z87891 Personal history of nicotine dependence: Secondary | ICD-10-CM | POA: Diagnosis not present

## 2017-07-21 DIAGNOSIS — Z79899 Other long term (current) drug therapy: Secondary | ICD-10-CM | POA: Diagnosis not present

## 2017-07-21 DIAGNOSIS — F329 Major depressive disorder, single episode, unspecified: Secondary | ICD-10-CM | POA: Diagnosis present

## 2017-07-21 LAB — COMPREHENSIVE METABOLIC PANEL
ALBUMIN: 3.9 g/dL (ref 3.5–5.0)
ALK PHOS: 97 U/L (ref 38–126)
ALT: 28 U/L (ref 0–44)
AST: 32 U/L (ref 15–41)
Anion gap: 8 (ref 5–15)
BUN: 10 mg/dL (ref 6–20)
CALCIUM: 7.3 mg/dL — AB (ref 8.9–10.3)
CO2: 29 mmol/L (ref 22–32)
Chloride: 103 mmol/L (ref 98–111)
Creatinine, Ser: 0.52 mg/dL (ref 0.44–1.00)
GFR calc non Af Amer: >60 mL/min (ref >60)
Glucose, Bld: 116 mg/dL — ABNORMAL HIGH (ref 70–99)
POTASSIUM: 3.9 mmol/L (ref 3.5–5.1)
SODIUM: 140 mmol/L (ref 135–145)
Total Bilirubin: 0.8 mg/dL (ref 0.3–1.2)
Total Protein: 7.4 g/dL (ref 6.5–8.1)

## 2017-07-21 LAB — URINE DRUG SCREEN, QUALITATIVE (ARMC ONLY)
Amphetamines, Ur Screen: NOT DETECTED
Benzodiazepine, Ur Scrn: NOT DETECTED
CANNABINOID 50 NG, UR ~~LOC~~: NOT DETECTED
Cocaine Metabolite,Ur ~~LOC~~: NOT DETECTED
MDMA (ECSTASY) UR SCREEN: NOT DETECTED
Methadone Scn, Ur: NOT DETECTED
OPIATE, UR SCREEN: NOT DETECTED
PHENCYCLIDINE (PCP) UR S: NOT DETECTED
Tricyclic, Ur Screen: NOT DETECTED

## 2017-07-21 LAB — CBC
HCT: 36.5 % (ref 35.0–47.0)
HEMOGLOBIN: 12.2 g/dL (ref 12.0–16.0)
MCH: 30.6 pg (ref 26.0–34.0)
MCHC: 33.5 g/dL (ref 32.0–36.0)
MCV: 91.2 fL (ref 80.0–100.0)
Platelets: 101 10*3/uL — ABNORMAL LOW (ref 150–440)
RBC: 4 MIL/uL (ref 3.80–5.20)
RDW: 13.9 % (ref 11.5–14.5)
WBC: 5.4 10*3/uL (ref 3.6–11.0)

## 2017-07-21 LAB — SALICYLATE LEVEL

## 2017-07-21 LAB — ETHANOL: Alcohol, Ethyl (B): <10 mg/dL (ref <10)

## 2017-07-21 LAB — ACETAMINOPHEN LEVEL: Acetaminophen (Tylenol), Serum: <10 ug/mL — ABNORMAL LOW (ref 10–30)

## 2017-07-21 NOTE — ED Provider Notes (Signed)
[facility] Emergency Department Provider Note  ____________________________________________  Time seen: Approximately 2:16 PM  I have reviewed the triage vital signs and the nursing notes.   HISTORY  Chief Complaint Homicidal    HPI AUDRIELLA BLAKELEY is a 52 y.o. female with a history of depression diabetes GERD hypertension and schizophrenia who reports that she is hearing voices of the devil who told her to kill everybody at her group home.  No SI.  No visual hallucinations.  These issues are chronic.  Denies any acute complaints.      Past Medical History:  Diagnosis Date  . Anxiety   . Depression   . Diabetes mellitus without complication ([place])   . GERD (gastroesophageal reflux disease)   . Hypertension      Patient Active Problem List   Diagnosis Date Noted  . Schizophrenia ([place]) 01/02/2017     History reviewed. No pertinent surgical history.   Prior to Admission medications   Medication Sig Start Date End Date Taking? Authorizing Provider  acetaminophen (TYLENOL) 325 MG tablet Take 2 tablets (650 mg total) by mouth every 6 (six) hours as needed for mild pain or headache. 01/15/17   Lindell Spar I, NP  benztropine (COGENTIN) 1 MG tablet Take 1 tablet (1 mg total) by mouth 2 (two) times daily. For prevention of drug induced tremors Patient not taking: Reported on 07/09/2017 01/15/17   Lindell Spar I, NP  carvedilol (COREG) 6.25 MG tablet Take 1 tablet (6.25 mg total) by mouth 2 (two) times daily. For high blood pressure 01/15/17   Nwoko, Herbert Pun I, NP  DULoxetine (CYMBALTA) 60 MG capsule Take 1 capsule (60 mg total) by mouth daily. For depression 01/16/17   Lindell Spar I, NP  ferrous sulfate (FEROSUL) 325 (65 FE) MG tablet Take 1 tablet (325 mg total) by mouth 2 (two) times daily. For anemia 01/15/17   Lindell Spar I, NP  gabapentin (NEURONTIN) 600 MG tablet Take 1 tablet (600 mg total) by mouth 3 (three) times daily. For agitation Patient not  taking: Reported on 07/09/2017 01/15/17   Lindell Spar I, NP  hydrOXYzine (ATARAX/VISTARIL) 25 MG tablet Take 1 tablet (25 mg total) by mouth 3 (three) times daily as needed for anxiety. Patient not taking: Reported on 07/09/2017 01/15/17   Lindell Spar I, NP  lamoTRIgine (LAMICTAL) 100 MG tablet Take 1 tablet (100 mg total) by mouth at bedtime. For mood stabilization 01/15/17   Nwoko, Agnes I, NP  LEVEMIR FLEXTOUCH 100 UNIT/ML Pen INJECT 30 UNITS UNDER SKIN EVERY TWELVE HOURS: For diabetes managment Patient taking differently: Inject 15 Units into the skin 2 (two) times daily.  01/15/17   Lindell Spar I, NP  levothyroxine (SYNTHROID, LEVOTHROID) 150 MCG tablet Take 1 tablet (150 mcg total) by mouth daily. For thyroid function hormone replacement 01/15/17   Lindell Spar I, NP  lisinopril (PRINIVIL,ZESTRIL) 10 MG tablet Take 1 tablet (10 mg total) by mouth daily. For high blood pressure 01/16/17   Nwoko, Herbert Pun I, NP  loratadine (CLARITIN) 10 MG tablet Take 1 tablet (10 mg total) by mouth daily. For allergies 01/16/17   Lindell Spar I, NP  lurasidone (LATUDA) 80 MG TABS tablet Take 1 tablet (80 mg total) by mouth 2 (two) times daily after a meal. For mood control Patient taking differently: Take 80 mg by mouth at bedtime.  01/15/17   Lindell Spar I, NP  metFORMIN (GLUCOPHAGE) 500 MG tablet Take 1 tablet (500 mg total) by mouth 2 (two) times  daily. For diabetes management 01/15/17   Lindell Spar I, NP  naproxen (NAPROSYN) 500 MG tablet Take 1 tablet (500 mg total) by mouth 2 (two) times daily with a meal. 07/10/17   Lavonia Drafts, MD  OS-CAL CALCIUM + D3 500-200 MG-UNIT TABS Take 1 tablet by mouth 2 (two) times daily. Patient not taking: Reported on 07/09/2017 01/15/17   Lindell Spar I, NP  pantoprazole (PROTONIX) 40 MG tablet Take 1 tablet (40 mg total) by mouth daily. For acid reflux 01/16/17   Nwoko, Herbert Pun I, NP  RESTASIS 0.05 % ophthalmic emulsion INSTILL ONE DROP IN AFFECTED EYE TWICE DAILY AS  DIRECTED: eye irritation Patient not taking: Reported on 07/09/2017 01/15/17   Lindell Spar I, NP  traZODone (DESYREL) 100 MG tablet Take 1 tablet (100 mg total) by mouth at bedtime as needed for sleep. 01/15/17   Lindell Spar I, NP  valACYclovir (VALTREX) 500 MG tablet Take 1 tablet (500 mg total) by mouth daily. For herpes simplex infection Patient not taking: Reported on 07/09/2017 01/15/17   Lindell Spar I, NP     Allergies Abilify [aripiprazole]; Haldol [haloperidol]; and Risperdal [risperidone]   No family history on file.  Social History Social History   Tobacco Use  . Smoking status: Former Research scientist (life sciences)  . Smokeless tobacco: Never Used  Substance Use Topics  . Alcohol use: No    Frequency: Never  . Drug use: Yes    Types: Marijuana, "Crack" cocaine    Comment: reports she has not done anything in a long time    Review of Systems  Constitutional:   No fever or chills.  ENT:   No sore throat. No rhinorrhea. Cardiovascular:   No chest pain or syncope. Respiratory:   No dyspnea or cough. Gastrointestinal:   Negative for abdominal pain, vomiting and diarrhea.  Musculoskeletal:   Negative for focal pain or swelling All other systems reviewed and are negative except as documented above in ROS and HPI.  ____________________________________________   PHYSICAL EXAM:  VITAL SIGNS: ED Triage Vitals  Enc Vitals Group     BP 07/21/17 1150 (!) 173/78     Pulse Rate 07/21/17 1150 80     Resp --      Temp 07/21/17 1150 99.3 F (37.4 C)     Temp Source 07/21/17 1150 Oral     SpO2 07/21/17 1150 94 %     Weight 07/21/17 1156 160 lb (72.6 kg)     Height 07/21/17 1156 4\' 11"  (1.499 m)     Head Circumference --      Peak Flow --      Pain Score 07/21/17 1155 10     Pain Loc --      Pain Edu? --      Excl. in [place]? --     Vital signs reviewed, nursing assessments reviewed.   Constitutional:   Alert and oriented. Non-toxic appearance. Eyes:   Conjunctivae are normal. EOMI.  PERRL. ENT      Head:   Normocephalic and atraumatic.          Neck:   No meningismus. Full ROM. Cardiovascular:   RRR. Symmetric bilateral radial and DP pulses.  No murmurs.  Respiratory:   Normal respiratory effort without tachypnea/retractions. Breath sounds are clear and equal bilaterally. No wheezes/rales/rhonchi. Gastrointestinal:   Soft and nontender. Non distended. There is no CVA tenderness.  No rebound, rigidity, or guarding. Musculoskeletal:   Normal range of motion in all extremities. Neurologic:   Normal speech  and language.  Motor grossly intact. No acute focal neurologic deficits are appreciated.  Skin:    Skin is warm, dry and intact. ____________________________________________    LABS (pertinent positives/negatives) (all labs ordered are listed, but only abnormal results are displayed) Labs Reviewed  COMPREHENSIVE METABOLIC PANEL - Abnormal; Notable for the following components:      Result Value   Glucose, Bld 116 (*)    Calcium 7.3 (*)    All other components within normal limits  ACETAMINOPHEN LEVEL - Abnormal; Notable for the following components:   Acetaminophen (Tylenol), Serum <10 (*)    All other components within normal limits  CBC - Abnormal; Notable for the following components:   Platelets 101 (*)    All other components within normal limits  ETHANOL  SALICYLATE LEVEL  URINE DRUG SCREEN, QUALITATIVE ([name] ONLY)   ____________________________________________   EKG    ____________________________________________    RADIOLOGY  No results found.  ____________________________________________   PROCEDURES Procedures  ____________________________________________    CLINICAL IMPRESSION / ASSESSMENT AND PLAN / ED COURSE  Pertinent labs & imaging results that were available during my care of the patient were reviewed by me and considered in my medical decision making (see chart for details).    Patient presents with chronic symptoms of  her underlying psychosis with auditory hallucinations.  She reports HI but without a clear plan to implement any destructive behaviors.  Case discussed with Dr. Weber Cooks after his evaluation the patient, feels the patient is psychiatrically stable and suitable for discharge home to her group home at this time.  She is calm and cooperative and not in apparent danger to herself or others.      ____________________________________________   FINAL CLINICAL IMPRESSION(S) / ED DIAGNOSES    Final diagnoses:  Schizophrenia, unspecified type [facility])     ED Discharge Orders    None      Portions of this note were generated with dragon dictation software. Dictation errors may occur despite best attempts at proofreading.    Carrie Mew, MD 07/22/17 2042
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
## 2017-07-21 NOTE — ED Notes (Signed)

## 2017-07-21 NOTE — ED Notes (Signed)
BEHAVIORAL HEALTH ROUNDING Patient sleeping: No. Patient alert and oriented: yes Behavior appropriate: Yes.  ; If no, describe:  Nutrition and fluids offered: yes Toileting and hygiene offered: Yes  Sitter present: q15 minute observations and security monitoring Law enforcement present: Yes    

## 2017-07-21 NOTE — BH Assessment (Signed)
Per the request of the patient's nurse (Amy T.), writer called and spoke with with Heron ([phone]) to inform her the patient is up for discharge.   Writer informed patient's nurse of the conversation.

## 2017-07-21 NOTE — ED Notes (Signed)
Pt to transfer back to her group home per psychiatry note  - awaiting group home to pick her up

## 2017-07-21 NOTE — ED Notes (Signed)
PT  VOL  PENDING  D/C ?
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
## 2017-07-21 NOTE — Consult Note (Signed)
Psychiatry: Patient seen chart reviewed.  Full note to follow.  Despite her statements the patient does not appear to be an acute risk that would require inpatient treatment or involuntary commitment.  Case reviewed with ER physician.  Recommend she be discharged back to her group home.
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
## 2017-08-13 DIAGNOSIS — D509 Iron deficiency anemia, unspecified: Secondary | ICD-10-CM | POA: Diagnosis not present

## 2017-08-13 DIAGNOSIS — G4700 Insomnia, unspecified: Secondary | ICD-10-CM | POA: Diagnosis not present

## 2017-08-13 DIAGNOSIS — R739 Hyperglycemia, unspecified: Secondary | ICD-10-CM | POA: Diagnosis not present

## 2017-08-13 DIAGNOSIS — I10 Essential (primary) hypertension: Secondary | ICD-10-CM | POA: Diagnosis not present

## 2017-08-13 DIAGNOSIS — K219 Gastro-esophageal reflux disease without esophagitis: Secondary | ICD-10-CM | POA: Diagnosis not present

## 2017-08-13 DIAGNOSIS — E559 Vitamin D deficiency, unspecified: Secondary | ICD-10-CM | POA: Diagnosis not present

## 2017-08-13 DIAGNOSIS — E785 Hyperlipidemia, unspecified: Secondary | ICD-10-CM | POA: Diagnosis not present

## 2017-08-13 DIAGNOSIS — F209 Schizophrenia, unspecified: Secondary | ICD-10-CM | POA: Diagnosis not present

## 2017-09-02 ENCOUNTER — Other Ambulatory Visit: Payer: Self-pay

## 2017-09-02 ENCOUNTER — Emergency Department
Admission: EM | Admit: 2017-09-02 | Discharge: 2017-09-03 | Disposition: A | Discharge status: Home / Self Care | Payer: Medicare Other | Attending: Emergency Medicine | Admitting: Emergency Medicine

## 2017-09-02 DIAGNOSIS — F329 Major depressive disorder, single episode, unspecified: Secondary | ICD-10-CM | POA: Diagnosis not present

## 2017-09-02 DIAGNOSIS — Z046 Encounter for general psychiatric examination, requested by authority: Secondary | ICD-10-CM | POA: Diagnosis present

## 2017-09-02 DIAGNOSIS — F4325 Adjustment disorder with mixed disturbance of emotions and conduct: Secondary | ICD-10-CM | POA: Diagnosis present

## 2017-09-02 DIAGNOSIS — F918 Other conduct disorders: Secondary | ICD-10-CM | POA: Diagnosis not present

## 2017-09-02 DIAGNOSIS — Z79899 Other long term (current) drug therapy: Secondary | ICD-10-CM | POA: Insufficient documentation

## 2017-09-02 DIAGNOSIS — Z794 Long term (current) use of insulin: Secondary | ICD-10-CM | POA: Diagnosis not present

## 2017-09-02 DIAGNOSIS — I10 Essential (primary) hypertension: Secondary | ICD-10-CM | POA: Diagnosis not present

## 2017-09-02 DIAGNOSIS — E119 Type 2 diabetes mellitus without complications: Secondary | ICD-10-CM

## 2017-09-02 DIAGNOSIS — Z87891 Personal history of nicotine dependence: Secondary | ICD-10-CM | POA: Diagnosis not present

## 2017-09-02 DIAGNOSIS — F209 Schizophrenia, unspecified: Secondary | ICD-10-CM | POA: Diagnosis present

## 2017-09-02 DIAGNOSIS — R451 Restlessness and agitation: Secondary | ICD-10-CM | POA: Diagnosis not present

## 2017-09-02 DIAGNOSIS — R45851 Suicidal ideations: Secondary | ICD-10-CM

## 2017-09-02 DIAGNOSIS — R4689 Other symptoms and signs involving appearance and behavior: Secondary | ICD-10-CM

## 2017-09-02 LAB — COMPREHENSIVE METABOLIC PANEL
ALT: 21 U/L (ref 0–44)
ANION GAP: 8 (ref 5–15)
AST: 27 U/L (ref 15–41)
Albumin: 4.2 g/dL (ref 3.5–5.0)
Alkaline Phosphatase: 86 U/L (ref 38–126)
BUN: 11 mg/dL (ref 6–20)
CHLORIDE: 100 mmol/L (ref 98–111)
CO2: 32 mmol/L (ref 22–32)
Calcium: 7.9 mg/dL — ABNORMAL LOW (ref 8.9–10.3)
Creatinine, Ser: 0.58 mg/dL (ref 0.44–1.00)
Glucose, Bld: 92 mg/dL (ref 70–99)
POTASSIUM: 4 mmol/L (ref 3.5–5.1)
SODIUM: 140 mmol/L (ref 135–145)
Total Bilirubin: 0.8 mg/dL (ref 0.3–1.2)
Total Protein: 8 g/dL (ref 6.5–8.1)

## 2017-09-02 LAB — CBC
HCT: 39.7 % (ref 35.0–47.0)
HEMOGLOBIN: 13.7 g/dL (ref 12.0–16.0)
MCH: 31.6 pg (ref 26.0–34.0)
MCHC: 34.5 g/dL (ref 32.0–36.0)
MCV: 91.5 fL (ref 80.0–100.0)
PLATELETS: 85 10*3/uL — AB (ref 150–440)
RBC: 4.33 MIL/uL (ref 3.80–5.20)
RDW: 14.2 % (ref 11.5–14.5)
WBC: 6.1 10*3/uL (ref 3.6–11.0)

## 2017-09-02 LAB — SALICYLATE LEVEL: Salicylate Lvl: <7 mg/dL (ref 2.8–30.0)

## 2017-09-02 LAB — URINE DRUG SCREEN, QUALITATIVE (ARMC ONLY)
Amphetamines, Ur Screen: NOT DETECTED
BARBITURATES, UR SCREEN: NOT DETECTED
CANNABINOID 50 NG, UR ~~LOC~~: NOT DETECTED
COCAINE METABOLITE, UR ~~LOC~~: NOT DETECTED
MDMA (ECSTASY) UR SCREEN: NOT DETECTED
METHADONE SCREEN, URINE: NOT DETECTED
Opiate, Ur Screen: NOT DETECTED
Phencyclidine (PCP) Ur S: NOT DETECTED
Tricyclic, Ur Screen: NOT DETECTED

## 2017-09-02 LAB — ETHANOL

## 2017-09-02 LAB — ACETAMINOPHEN LEVEL

## 2017-09-02 MED ORDER — TRAZODONE HCL 100 MG PO TABS
100.0000 mg | ORAL_TABLET | Freq: Every day | ORAL | Status: DC
  Administered 2017-09-02: 100 mg via ORAL
  Filled 2017-09-02: qty 1

## 2017-09-02 MED ORDER — LURASIDONE HCL 80 MG PO TABS
80.0000 mg | ORAL_TABLET | Freq: Two times a day (BID) | ORAL | Status: DC
  Administered 2017-09-02 – 2017-09-03 (×2): 80 mg via ORAL
  Filled 2017-09-02 (×3): qty 1

## 2017-09-02 MED ORDER — GABAPENTIN 300 MG PO CAPS
600.0000 mg | ORAL_CAPSULE | Freq: Three times a day (TID) | ORAL | Status: DC
  Administered 2017-09-02 – 2017-09-03 (×2): 600 mg via ORAL
  Filled 2017-09-02 (×5): qty 2

## 2017-09-02 MED ORDER — CARVEDILOL 6.25 MG PO TABS
6.2500 mg | ORAL_TABLET | Freq: Two times a day (BID) | ORAL | Status: DC
  Administered 2017-09-02 – 2017-09-03 (×2): 6.25 mg via ORAL
  Filled 2017-09-02 (×2): qty 1

## 2017-09-02 MED ORDER — LAMOTRIGINE 100 MG PO TABS
100.0000 mg | ORAL_TABLET | Freq: Every day | ORAL | Status: DC
  Administered 2017-09-02: 100 mg via ORAL
  Filled 2017-09-02: qty 1

## 2017-09-02 MED ORDER — DULOXETINE HCL 60 MG PO CPEP
60.0000 mg | ORAL_CAPSULE | Freq: Every day | ORAL | Status: DC
  Administered 2017-09-03: 60 mg via ORAL
  Filled 2017-09-02: qty 1

## 2017-09-02 MED ORDER — METFORMIN HCL 500 MG PO TABS
500.0000 mg | ORAL_TABLET | Freq: Two times a day (BID) | ORAL | Status: DC
  Administered 2017-09-03: 500 mg via ORAL
  Filled 2017-09-02: qty 1

## 2017-09-02 MED ORDER — LEVOTHYROXINE SODIUM 50 MCG PO TABS
150.0000 ug | ORAL_TABLET | Freq: Every day | ORAL | Status: DC
  Administered 2017-09-03: 150 ug via ORAL
  Filled 2017-09-02: qty 3

## 2017-09-02 MED ORDER — INSULIN DETEMIR 100 UNIT/ML ~~LOC~~ SOLN
15.0000 [IU] | Freq: Two times a day (BID) | SUBCUTANEOUS | Status: DC
  Administered 2017-09-02 – 2017-09-03 (×2): 15 [IU] via SUBCUTANEOUS
  Filled 2017-09-02 (×4): qty 0.15

## 2017-09-02 MED ORDER — LISINOPRIL 10 MG PO TABS
10.0000 mg | ORAL_TABLET | Freq: Every day | ORAL | Status: DC
  Administered 2017-09-03: 10 mg via ORAL
  Filled 2017-09-02: qty 1

## 2017-09-02 MED ORDER — BENZTROPINE MESYLATE 1 MG PO TABS
1.0000 mg | ORAL_TABLET | Freq: Two times a day (BID) | ORAL | Status: DC
  Administered 2017-09-02 – 2017-09-03 (×2): 1 mg via ORAL
  Filled 2017-09-02 (×2): qty 1

## 2017-09-02 NOTE — ED Notes (Signed)
Pt given supper tray.
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
## 2017-09-02 NOTE — ED Notes (Signed)
Patient is resting comfortably. 
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
## 2017-09-02 NOTE — ED Notes (Signed)
Patient assigned to appropriate care area   Introduced self to pt  Patient oriented to unit/care area: Informed that, for their safety, care areas are designed for safety and visiting and phone hours explained to patient. Patient verbalizes understanding, and verbal contract for safety obtained  Environment secured  

## 2017-09-02 NOTE — ED Notes (Signed)
Pt changed into wine colored behavior scrubs. Labs drawn and pt belongings placed in belongings bag and consisted of: black flip flops, underwear, short pants, bra, shirt. Pt walked back to [facility] and belongings placed at quad RN station.
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
## 2017-09-03 DIAGNOSIS — R451 Restlessness and agitation: Secondary | ICD-10-CM | POA: Diagnosis not present

## 2017-09-03 LAB — GLUCOSE, CAPILLARY: Glucose-Capillary: 119 mg/dL — ABNORMAL HIGH (ref 70–99)

## 2017-09-03 NOTE — ED Notes (Signed)
Patient talked with Probation officer and said "im seeing things", when asked what she is seeing patient said "red blue:, when asked what that means "she said I see it sometimes when I get angry and trying to calm down". Patient denies SI/HI/AH. Patient does not want to go back to group home, and she keeps saying "they don't want me back".

## 2017-09-03 NOTE — Discharge Instructions (Addendum)
Return to the ER for new, worsening, or persistent thoughts of wanting to hurt yourself or anyone else, hearing voices, worsening anxiety or depression, or any other new or worsening symptoms that concern you.

## 2017-09-03 NOTE — BH Assessment (Signed)
This Probation officer received return call from group home [facility] [phone]) and she stated she is in court in [place] and doesn't know when she will be here to pick up patient today. [name] stated she wants to make sure patients medication has been changed and she will call back before 7pm to inform of when she will pick up patient.

## 2017-09-03 NOTE — BH Assessment (Signed)
This Probation officer called group home [facility] [phone]) in regards to picking up patient from [name]. Left HIPPA compliant voicemail for return call.

## 2017-09-03 NOTE — ED Notes (Signed)
BEHAVIORAL HEALTH ROUNDING Patient sleeping: No. Patient alert and oriented: yes Behavior appropriate: Yes.  ; If no, describe:  Nutrition and fluids offered: yes Toileting and hygiene offered: Yes  Sitter present: q15 minute observations and security monitoring Law enforcement present: Yes    

## 2017-09-03 NOTE — ED Provider Notes (Signed)
-----------------------------------------   3:15 PM on 09/03/2017 -----------------------------------------  The patient has been evaluated by Dr. Weber Cooks.  IVC has been rescinded.  The patient is cleared to go back to her group home.  She is stable for discharge at this time.  Return precautions have been provided.   Arta Silence, MD 09/03/17 [phone]

## 2017-09-03 NOTE — ED Notes (Signed)
Patient made a phone call to her brother and he is coming to visit today, patient is under the assumption that she can not go back to the group home.

## 2017-09-03 NOTE — ED Notes (Signed)
Patient discharged to group home, patient received discharge papers. Patient received belongings and verbalized he has received all of his belongings. Patient appropriate and cooperative, Denies SI/HI AVH. Vital signs taken. NAD noted.

## 2017-09-06 DIAGNOSIS — E113213 Type 2 diabetes mellitus with mild nonproliferative diabetic retinopathy with macular edema, bilateral: Secondary | ICD-10-CM | POA: Diagnosis not present

## 2017-09-09 DIAGNOSIS — F419 Anxiety disorder, unspecified: Secondary | ICD-10-CM | POA: Diagnosis not present

## 2017-09-09 DIAGNOSIS — F209 Schizophrenia, unspecified: Secondary | ICD-10-CM | POA: Diagnosis not present

## 2017-10-14 DIAGNOSIS — F209 Schizophrenia, unspecified: Secondary | ICD-10-CM | POA: Diagnosis not present

## 2017-10-14 DIAGNOSIS — F419 Anxiety disorder, unspecified: Secondary | ICD-10-CM | POA: Diagnosis not present

## 2017-10-21 DIAGNOSIS — F209 Schizophrenia, unspecified: Secondary | ICD-10-CM | POA: Diagnosis not present

## 2017-10-21 DIAGNOSIS — F419 Anxiety disorder, unspecified: Secondary | ICD-10-CM | POA: Diagnosis not present

## 2017-10-27 DIAGNOSIS — F209 Schizophrenia, unspecified: Secondary | ICD-10-CM | POA: Diagnosis not present

## 2017-11-04 DIAGNOSIS — E785 Hyperlipidemia, unspecified: Secondary | ICD-10-CM | POA: Diagnosis not present

## 2017-11-04 DIAGNOSIS — I10 Essential (primary) hypertension: Secondary | ICD-10-CM | POA: Diagnosis not present

## 2017-11-04 DIAGNOSIS — R739 Hyperglycemia, unspecified: Secondary | ICD-10-CM | POA: Diagnosis not present

## 2017-11-04 DIAGNOSIS — Z0000 Encounter for general adult medical examination without abnormal findings: Secondary | ICD-10-CM | POA: Diagnosis not present

## 2017-11-04 DIAGNOSIS — F209 Schizophrenia, unspecified: Secondary | ICD-10-CM | POA: Diagnosis not present

## 2017-11-04 DIAGNOSIS — K219 Gastro-esophageal reflux disease without esophagitis: Secondary | ICD-10-CM | POA: Diagnosis not present

## 2017-11-04 DIAGNOSIS — E039 Hypothyroidism, unspecified: Secondary | ICD-10-CM | POA: Diagnosis not present

## 2017-11-04 DIAGNOSIS — E559 Vitamin D deficiency, unspecified: Secondary | ICD-10-CM | POA: Diagnosis not present

## 2017-11-04 DIAGNOSIS — R5383 Other fatigue: Secondary | ICD-10-CM | POA: Diagnosis not present

## 2017-11-04 DIAGNOSIS — R7309 Other abnormal glucose: Secondary | ICD-10-CM | POA: Diagnosis not present

## 2017-11-05 DIAGNOSIS — F419 Anxiety disorder, unspecified: Secondary | ICD-10-CM | POA: Diagnosis not present

## 2017-11-05 DIAGNOSIS — F209 Schizophrenia, unspecified: Secondary | ICD-10-CM | POA: Diagnosis not present

## 2017-11-09 DIAGNOSIS — E113213 Type 2 diabetes mellitus with mild nonproliferative diabetic retinopathy with macular edema, bilateral: Secondary | ICD-10-CM | POA: Diagnosis not present

## 2017-11-09 DIAGNOSIS — H34812 Central retinal vein occlusion, left eye, with macular edema: Secondary | ICD-10-CM | POA: Diagnosis not present

## 2017-12-13 DIAGNOSIS — F419 Anxiety disorder, unspecified: Secondary | ICD-10-CM | POA: Diagnosis not present

## 2017-12-13 DIAGNOSIS — F209 Schizophrenia, unspecified: Secondary | ICD-10-CM | POA: Diagnosis not present

## 2017-12-14 DIAGNOSIS — H34812 Central retinal vein occlusion, left eye, with macular edema: Secondary | ICD-10-CM | POA: Diagnosis not present

## 2017-12-20 DIAGNOSIS — F419 Anxiety disorder, unspecified: Secondary | ICD-10-CM | POA: Diagnosis not present

## 2017-12-20 DIAGNOSIS — F209 Schizophrenia, unspecified: Secondary | ICD-10-CM | POA: Diagnosis not present

## 2017-12-26 ENCOUNTER — Other Ambulatory Visit: Payer: Self-pay

## 2017-12-26 ENCOUNTER — Emergency Department
Admission: EM | Admit: 2017-12-26 | Discharge: 2017-12-27 | Disposition: A | Discharge status: Home / Self Care | Payer: Medicare Other | Attending: Emergency Medicine | Admitting: Emergency Medicine

## 2017-12-26 ENCOUNTER — Encounter: Payer: Self-pay | Admitting: Emergency Medicine

## 2017-12-26 DIAGNOSIS — Z79899 Other long term (current) drug therapy: Secondary | ICD-10-CM | POA: Diagnosis not present

## 2017-12-26 DIAGNOSIS — Z87891 Personal history of nicotine dependence: Secondary | ICD-10-CM | POA: Diagnosis not present

## 2017-12-26 DIAGNOSIS — F209 Schizophrenia, unspecified: Secondary | ICD-10-CM | POA: Diagnosis not present

## 2017-12-26 DIAGNOSIS — R45851 Suicidal ideations: Secondary | ICD-10-CM | POA: Diagnosis not present

## 2017-12-26 DIAGNOSIS — I10 Essential (primary) hypertension: Secondary | ICD-10-CM | POA: Insufficient documentation

## 2017-12-26 DIAGNOSIS — F329 Major depressive disorder, single episode, unspecified: Secondary | ICD-10-CM | POA: Diagnosis not present

## 2017-12-26 DIAGNOSIS — F1210 Cannabis abuse, uncomplicated: Secondary | ICD-10-CM | POA: Insufficient documentation

## 2017-12-26 DIAGNOSIS — R45850 Homicidal ideations: Secondary | ICD-10-CM | POA: Insufficient documentation

## 2017-12-26 DIAGNOSIS — F4325 Adjustment disorder with mixed disturbance of emotions and conduct: Secondary | ICD-10-CM | POA: Diagnosis not present

## 2017-12-26 DIAGNOSIS — F70 Mild intellectual disabilities: Secondary | ICD-10-CM

## 2017-12-26 DIAGNOSIS — Z794 Long term (current) use of insulin: Secondary | ICD-10-CM | POA: Insufficient documentation

## 2017-12-26 DIAGNOSIS — F79 Unspecified intellectual disabilities: Secondary | ICD-10-CM

## 2017-12-26 DIAGNOSIS — E119 Type 2 diabetes mellitus without complications: Secondary | ICD-10-CM | POA: Diagnosis not present

## 2017-12-26 DIAGNOSIS — Z046 Encounter for general psychiatric examination, requested by authority: Secondary | ICD-10-CM | POA: Diagnosis present

## 2017-12-26 DIAGNOSIS — F1410 Cocaine abuse, uncomplicated: Secondary | ICD-10-CM | POA: Insufficient documentation

## 2017-12-26 LAB — COMPREHENSIVE METABOLIC PANEL
ALT: 21 U/L (ref 0–44)
ANION GAP: 10 (ref 5–15)
AST: 26 U/L (ref 15–41)
Albumin: 4.3 g/dL (ref 3.5–5.0)
Alkaline Phosphatase: 79 U/L (ref 38–126)
BUN: 11 mg/dL (ref 6–20)
CALCIUM: 7.9 mg/dL — AB (ref 8.9–10.3)
CHLORIDE: 100 mmol/L (ref 98–111)
CO2: 30 mmol/L (ref 22–32)
Creatinine, Ser: 0.64 mg/dL (ref 0.44–1.00)
Glucose, Bld: 149 mg/dL — ABNORMAL HIGH (ref 70–99)
Potassium: 4 mmol/L (ref 3.5–5.1)
SODIUM: 140 mmol/L (ref 135–145)
Total Bilirubin: 0.5 mg/dL (ref 0.3–1.2)
Total Protein: 7.8 g/dL (ref 6.5–8.1)

## 2017-12-26 LAB — CBC
HCT: 39.6 % (ref 36.0–46.0)
Hemoglobin: 13 g/dL (ref 12.0–15.0)
MCH: 30.4 pg (ref 26.0–34.0)
MCHC: 32.8 g/dL (ref 30.0–36.0)
MCV: 92.5 fL (ref 80.0–100.0)
PLATELETS: 77 10*3/uL — AB (ref 150–400)
RBC: 4.28 MIL/uL (ref 3.87–5.11)
RDW: 13.3 % (ref 11.5–15.5)
WBC: 5.2 10*3/uL (ref 4.0–10.5)
nRBC: 0 % (ref 0.0–0.2)

## 2017-12-26 LAB — ACETAMINOPHEN LEVEL

## 2017-12-26 LAB — SALICYLATE LEVEL

## 2017-12-26 LAB — ETHANOL

## 2017-12-26 MED ORDER — ZIPRASIDONE MESYLATE 20 MG IM SOLR
10.0000 mg | Freq: Once | INTRAMUSCULAR | Status: AC
  Administered 2017-12-26: 10 mg via INTRAMUSCULAR
  Filled 2017-12-26: qty 20

## 2017-12-26 MED ORDER — LURASIDONE HCL 40 MG PO TABS
80.0000 mg | ORAL_TABLET | Freq: Two times a day (BID) | ORAL | Status: DC
  Administered 2017-12-27: 80 mg via ORAL
  Filled 2017-12-26: qty 2

## 2017-12-26 MED ORDER — HYDROXYZINE HCL 25 MG PO TABS
25.0000 mg | ORAL_TABLET | Freq: Three times a day (TID) | ORAL | Status: DC | PRN
  Administered 2017-12-27: 25 mg via ORAL
  Filled 2017-12-26: qty 1

## 2017-12-26 MED ORDER — TRAZODONE HCL 100 MG PO TABS: 100.0000 mg | ORAL_TABLET | Freq: Every day | ORAL | Status: DC

## 2017-12-26 MED ORDER — BENZTROPINE MESYLATE 1 MG PO TABS
1.0000 mg | ORAL_TABLET | Freq: Two times a day (BID) | ORAL | Status: DC
  Administered 2017-12-26 – 2017-12-27 (×2): 1 mg via ORAL
  Filled 2017-12-26 (×2): qty 1

## 2017-12-26 NOTE — ED Notes (Signed)
Patient yelling and banging on and kicking the door to the pod.

## 2017-12-26 NOTE — ED Triage Notes (Signed)
Patient states she was at [facility] and is brought in by [place] PD under IVC. Patient states she "wants to kill everyone in the group home and her roommate and wants to kill herself to." Patient states "they are all evil and I want to burn it to the ground." Patient states she is her own guardian and wants to live on the streets when she leaves the hospital. Per patient, she has been given an eviction notice from group home. Patient also states she does not want to take her medication and does not want to eat.
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
## 2017-12-26 NOTE — ED Notes (Signed)
Report to include Situation, Background, Assessment, and Recommendations received from [facility]. Patient alert and oriented, warm and dry, in no acute distress. Patient states wants to "jump out in front of a train" and wants to "kill everybody" as well as hearing voices telling her to "kill people" and seeing "devils". Patient made aware of Q15 minute rounds and security cameras for their safety. Patient instructed to come to me with needs or concerns.

## 2017-12-26 NOTE — ED Notes (Signed)
Hourly rounding reveals patient in room. No complaints, stable, in no acute distress. Q15 minute rounds and monitoring via Security Cameras to continue. 

## 2017-12-26 NOTE — ED Notes (Signed)

## 2017-12-26 NOTE — ED Provider Notes (Signed)
[facility] Emergency Department Provider Note  ____________________________________________   First MD Initiated Contact with Patient 12/26/17 1645     (approximate)  I have reviewed the triage vital signs and the nursing notes.   HISTORY  Chief Complaint Homicidal   HPI Jessica [name] is a 52 y.o. female with a history of depression as well as schizophrenia who is presenting to the emergency department today under involuntary commitment after threatening to burn down her group home as well as kill people there.  She also states that she is thinking of killing herself by walking in front of a train.  She says that she does not want to stay at her group home any longer and this was the reason for her threats today.  Says that she would rather live on the street.   Past Medical History:  Diagnosis Date  . Anxiety   . Depression   . Diabetes mellitus without complication ([place])   . GERD (gastroesophageal reflux disease)   . Hypertension     Patient Active Problem List   Diagnosis Date Noted  . Diabetes mellitus without complication ([place]) 88/89/1694  . Adjustment disorder with mixed disturbance of emotions and conduct 07/21/2017  . Schizophrenia ([place]) 01/02/2017    History reviewed. No pertinent surgical history.  Prior to Admission medications   Medication Sig Start Date End Date Taking? Authorizing Provider  acetaminophen (TYLENOL) 325 MG tablet Take 2 tablets (650 mg total) by mouth every 6 (six) hours as needed for mild pain or headache. 01/15/17   Lindell Spar I, NP  benztropine (COGENTIN) 1 MG tablet Take 1 tablet (1 mg total) by mouth 2 (two) times daily. For prevention of drug induced tremors Patient not taking: Reported on 07/09/2017 01/15/17   Lindell Spar I, NP  carvedilol (COREG) 6.25 MG tablet Take 1 tablet (6.25 mg total) by mouth 2 (two) times daily. For high blood pressure 01/15/17   Nwoko, Herbert Pun I, NP  DULoxetine (CYMBALTA) 60 MG  capsule Take 1 capsule (60 mg total) by mouth daily. For depression 01/16/17   Lindell Spar I, NP  ferrous sulfate (FEROSUL) 325 (65 FE) MG tablet Take 1 tablet (325 mg total) by mouth 2 (two) times daily. For anemia 01/15/17   Lindell Spar I, NP  gabapentin (NEURONTIN) 600 MG tablet Take 1 tablet (600 mg total) by mouth 3 (three) times daily. For agitation Patient not taking: Reported on 07/09/2017 01/15/17   Lindell Spar I, NP  hydrOXYzine (ATARAX/VISTARIL) 25 MG tablet Take 1 tablet (25 mg total) by mouth 3 (three) times daily as needed for anxiety. Patient not taking: Reported on 07/09/2017 01/15/17   Lindell Spar I, NP  lamoTRIgine (LAMICTAL) 100 MG tablet Take 1 tablet (100 mg total) by mouth at bedtime. For mood stabilization 01/15/17   Nwoko, Agnes I, NP  LEVEMIR FLEXTOUCH 100 UNIT/ML Pen INJECT 30 UNITS UNDER SKIN EVERY TWELVE HOURS: For diabetes managment Patient taking differently: Inject 15 Units into the skin 2 (two) times daily.  01/15/17   Lindell Spar I, NP  levothyroxine (SYNTHROID, LEVOTHROID) 150 MCG tablet Take 1 tablet (150 mcg total) by mouth daily. For thyroid function hormone replacement 01/15/17   Lindell Spar I, NP  lisinopril (PRINIVIL,ZESTRIL) 10 MG tablet Take 1 tablet (10 mg total) by mouth daily. For high blood pressure 01/16/17   Nwoko, Herbert Pun I, NP  loratadine (CLARITIN) 10 MG tablet Take 1 tablet (10 mg total) by mouth daily. For allergies 01/16/17   Nwoko,  Loleta Dicker, NP  lurasidone (LATUDA) 80 MG TABS tablet Take 1 tablet (80 mg total) by mouth 2 (two) times daily after a meal. For mood control Patient taking differently: Take 80 mg by mouth at bedtime.  01/15/17   Lindell Spar I, NP  metFORMIN (GLUCOPHAGE) 500 MG tablet Take 1 tablet (500 mg total) by mouth 2 (two) times daily. For diabetes management 01/15/17   Lindell Spar I, NP  naproxen (NAPROSYN) 500 MG tablet Take 1 tablet (500 mg total) by mouth 2 (two) times daily with a meal. 07/10/17   Lavonia Drafts, MD    OS-CAL CALCIUM + D3 500-200 MG-UNIT TABS Take 1 tablet by mouth 2 (two) times daily. Patient not taking: Reported on 07/09/2017 01/15/17   Lindell Spar I, NP  pantoprazole (PROTONIX) 40 MG tablet Take 1 tablet (40 mg total) by mouth daily. For acid reflux 01/16/17   Nwoko, Herbert Pun I, NP  RESTASIS 0.05 % ophthalmic emulsion INSTILL ONE DROP IN AFFECTED EYE TWICE DAILY AS DIRECTED: eye irritation Patient not taking: Reported on 07/09/2017 01/15/17   Lindell Spar I, NP  traZODone (DESYREL) 100 MG tablet Take 1 tablet (100 mg total) by mouth at bedtime as needed for sleep. 01/15/17   Lindell Spar I, NP  valACYclovir (VALTREX) 500 MG tablet Take 1 tablet (500 mg total) by mouth daily. For herpes simplex infection Patient not taking: Reported on 07/09/2017 01/15/17   Lindell Spar I, NP    Allergies Abilify [aripiprazole]; Haldol [haloperidol]; and Risperdal [risperidone]  No family history on file.  Social History Social History   Tobacco Use  . Smoking status: Former Research scientist (life sciences)  . Smokeless tobacco: Never Used  Substance Use Topics  . Alcohol use: No    Frequency: Never  . Drug use: Yes    Types: Marijuana, "Crack" cocaine    Comment: reports she has not done anything in a long time    Review of Systems  Constitutional: No fever/chills Eyes: No visual changes. ENT: No sore throat. Cardiovascular: Denies chest pain. Respiratory: Denies shortness of breath. Gastrointestinal: No abdominal pain.  No nausea, no vomiting.  No diarrhea.  No constipation. Genitourinary: Negative for dysuria. Musculoskeletal: Negative for back pain. Skin: Negative for rash. Neurological: Negative for headaches, focal weakness or numbness.   ____________________________________________   PHYSICAL EXAM:  VITAL SIGNS: ED Triage Vitals  Enc Vitals Group     BP 12/26/17 1629 (!) 166/89     Pulse Rate 12/26/17 1629 87     Resp 12/26/17 1629 18     Temp 12/26/17 1629 98.2 F (36.8 C)     Temp Source  12/26/17 1629 Oral     SpO2 12/26/17 1629 98 %     Weight 12/26/17 1631 154 lb (69.9 kg)     Height 12/26/17 1631 4\' 11"  (1.499 m)     Head Circumference --      Peak Flow --      Pain Score 12/26/17 1629 0     Pain Loc --      Pain Edu? --      Excl. in [place]? --     Constitutional: Alert and oriented.  Patient does not appear in any distress but does appear slightly agitated.  She is also refusing any further physical exam. Eyes: Conjunctivae are normal.  Head: Atraumatic. Nose: No congestion/rhinnorhea. Mouth/Throat: Mucous membranes are moist.  Neck: No stridor.   Cardiovascular: Unable to assess secondary to patient refusal. Respiratory: Normal respiratory effort.   Gastrointestinal: Unable  to assess secondary to patient refusal. Musculoskeletal: Unable to assess secondary to patient refusal Neurologic:  Normal speech and language. No gross focal neurologic deficits are appreciated. Skin:  Skin is warm, dry and intact. No rash noted. Psychiatric: Mildly agitated.  ____________________________________________   LABS (all labs ordered are listed, but only abnormal results are displayed)  Labs Reviewed  COMPREHENSIVE METABOLIC PANEL - Abnormal; Notable for the following components:      Result Value   Glucose, Bld 149 (*)    Calcium 7.9 (*)    All other components within normal limits  CBC - Abnormal; Notable for the following components:   Platelets 77 (*)    All other components within normal limits  ETHANOL  SALICYLATE LEVEL  ACETAMINOPHEN LEVEL  URINE DRUG SCREEN, QUALITATIVE ([name] ONLY)   ____________________________________________  EKG   ____________________________________________  RADIOLOGY   ____________________________________________   PROCEDURES  Procedure(s) performed:   Procedures  Critical Care performed:   ____________________________________________   INITIAL IMPRESSION / ASSESSMENT AND PLAN / ED COURSE  Pertinent labs & imaging  results that were available during my care of the patient were reviewed by me and considered in my medical decision making (see chart for details).  DDX: Medication noncompliance, schizophrenia, homicidal ideation, suicidal ideation As part of my medical decision making, I reviewed the following data within the electronic MEDICAL RECORD NUMBER Notes from prior ED visits  I will uphold the patient's involuntary commitment.  Psychiatry consult pending. ____________________________________________   FINAL CLINICAL IMPRESSION(S) / ED DIAGNOSES  Homicidal ideation.  Suicidal ideation.  NEW MEDICATIONS STARTED DURING THIS VISIT:  New Prescriptions   No medications on file     Note:  This document was prepared using Dragon voice recognition software and may include unintentional dictation errors.     Orbie Pyo, MD 12/26/17 1728

## 2017-12-26 NOTE — ED Notes (Signed)
Pt verbalizes  "I am not going to take any medicine - I am not going to give you any pee - I am not going to talk to anybody but the psychiatrist and it better not be on a computer  - Yes I want to kill myself by walking in front of a train.  I do not have a guardian so I am going to get out of here and go live in the woods.  My sister gets my check and she lives in [place] but I don't need my money - I will go live in the woods and be alone and happy."
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
## 2017-12-26 NOTE — ED Notes (Signed)
BEHAVIORAL HEALTH ROUNDING Patient sleeping: No. Patient alert and oriented: yes Behavior appropriate: Yes.  ; If no, describe:  Nutrition and fluids offered: yes Toileting and hygiene offered: Yes  Sitter present: q15 minute observations and security  monitoring Law enforcement present: Yes  ODS  
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
## 2017-12-26 NOTE — ED Notes (Signed)
Patient punching wall in room.

## 2017-12-26 NOTE — ED Notes (Signed)
Pt states that she is still suicidal and homicidal since she has been here in the ED.  Pt states she doesn't want to return to the group home.  She states she has thoughts of hurting everyone in the group home and herself.
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
## 2017-12-26 NOTE — ED Notes (Signed)
Patient stated she wants to "kill everybody tonight". Told patient I would note that in her chart. Academic librarian informed of same.

## 2017-12-26 NOTE — ED Notes (Signed)
Patients belongings placed in 1 belonging bag and secured. Tennis shoes, socks, jeans, underwear, bra, navy shirt, black jacket, $7 cash.

## 2017-12-26 NOTE — ED Notes (Signed)
Pt. Observed in her room tying two pillow cases together and placing them around her neck. Officer Sabra Heck and I went to patients room asked for the pillow cases and escorted patient to room 7 and removed all linen from the rooms to avoid another attempt to make a ligature. Patient went willingly without incident.

## 2017-12-26 NOTE — ED Notes (Signed)
Hourly rounding reveals patient in room talking to [facility]. No complaints, stable, in no acute distress. Q15 minute rounds and monitoring via [employer] to continue.

## 2017-12-27 DIAGNOSIS — R45850 Homicidal ideations: Secondary | ICD-10-CM | POA: Diagnosis not present

## 2017-12-27 DIAGNOSIS — F4325 Adjustment disorder with mixed disturbance of emotions and conduct: Secondary | ICD-10-CM

## 2017-12-27 DIAGNOSIS — F79 Unspecified intellectual disabilities: Secondary | ICD-10-CM

## 2017-12-27 DIAGNOSIS — F70 Mild intellectual disabilities: Secondary | ICD-10-CM

## 2017-12-27 NOTE — ED Notes (Signed)
Patient ate 100% of breakfast and beverage, she is cooperative, no behavioral issues at this time, will continue to monitor, q 15 minute checks and cameras surveillance for safety.

## 2017-12-27 NOTE — ED Notes (Signed)
Hourly rounding reveals patient sleeping in room. No complaints, stable, in no acute distress. Q15 minute rounds and monitoring via Security Cameras to continue. 

## 2017-12-27 NOTE — ED Notes (Signed)
Patient with lunch tray and beverage, no signs of distress.

## 2017-12-27 NOTE — ED Provider Notes (Signed)
-----------------------------------------   3:41 PM on 12/27/2017 -----------------------------------------  Patient has been seen and evaluated by psychiatry, they believe the patient is safe for discharge home from a psychiatric standpoint.  Patient's medical work-up has been largely nonrevealing.  Patient will be discharged with outpatient follow-up.   Harvest Dark, MD 12/27/17 [phone]

## 2017-12-27 NOTE — ED Notes (Signed)
Hourly rounding reveals patient in room. No complaints, stable, in no acute distress. Q15 minute rounds and monitoring via Security Cameras to continue. 

## 2017-12-27 NOTE — Discharge Instructions (Addendum)
You have been seen in the emergency department for a  psychiatric concern. You have been evaluated both medically as well as psychiatrically. Please follow-up with your outpatient resources provided. Return to the emergency department for any worsening symptoms, or any thoughts of hurting yourself or anyone else so that we may attempt to help you. 

## 2017-12-27 NOTE — ED Notes (Signed)
Dr. Weber Cooks talking to Patient at this time, no behavioral issues.
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
## 2017-12-27 NOTE — ED Notes (Signed)
Hourly rounding reveals patient in room lying on bed. No complaints, stable, in no acute distress. Q15 minute rounds and monitoring via [employer] to continue.

## 2017-12-27 NOTE — ED Notes (Signed)
Patient voices understanding of discharge instructions, called group home and she has transportation on the way to pick  Her up, all of her belongings given back to her, Patient without signs of distress.

## 2017-12-28 DIAGNOSIS — Z79899 Other long term (current) drug therapy: Secondary | ICD-10-CM | POA: Diagnosis not present

## 2017-12-28 DIAGNOSIS — F209 Schizophrenia, unspecified: Secondary | ICD-10-CM | POA: Diagnosis not present

## 2017-12-28 DIAGNOSIS — Z794 Long term (current) use of insulin: Secondary | ICD-10-CM | POA: Diagnosis not present

## 2017-12-28 DIAGNOSIS — I10 Essential (primary) hypertension: Secondary | ICD-10-CM | POA: Diagnosis not present

## 2017-12-28 DIAGNOSIS — F329 Major depressive disorder, single episode, unspecified: Secondary | ICD-10-CM | POA: Diagnosis not present

## 2017-12-28 DIAGNOSIS — F419 Anxiety disorder, unspecified: Secondary | ICD-10-CM | POA: Diagnosis not present

## 2017-12-28 DIAGNOSIS — F333 Major depressive disorder, recurrent, severe with psychotic symptoms: Secondary | ICD-10-CM | POA: Diagnosis not present

## 2017-12-28 DIAGNOSIS — R45851 Suicidal ideations: Secondary | ICD-10-CM | POA: Diagnosis not present

## 2017-12-28 DIAGNOSIS — E119 Type 2 diabetes mellitus without complications: Secondary | ICD-10-CM | POA: Diagnosis not present

## 2017-12-29 ENCOUNTER — Encounter: Payer: Self-pay | Admitting: Psychiatry

## 2017-12-29 ENCOUNTER — Other Ambulatory Visit: Payer: Self-pay | Admitting: Psychiatry

## 2017-12-29 ENCOUNTER — Other Ambulatory Visit: Payer: Self-pay

## 2017-12-29 ENCOUNTER — Inpatient Hospital Stay
Admission: AD | Admit: 2017-12-29 | Discharge: 2018-01-03 | DRG: 885 | Disposition: A | Discharge status: Home / Self Care | Payer: Medicare Other | Source: Other Acute Inpatient Hospital | Attending: Psychiatry | Admitting: Psychiatry

## 2017-12-29 DIAGNOSIS — Z79899 Other long term (current) drug therapy: Secondary | ICD-10-CM | POA: Diagnosis not present

## 2017-12-29 DIAGNOSIS — Z915 Personal history of self-harm: Secondary | ICD-10-CM | POA: Diagnosis not present

## 2017-12-29 DIAGNOSIS — I10 Essential (primary) hypertension: Secondary | ICD-10-CM | POA: Diagnosis present

## 2017-12-29 DIAGNOSIS — Z87891 Personal history of nicotine dependence: Secondary | ICD-10-CM

## 2017-12-29 DIAGNOSIS — Z79890 Hormone replacement therapy (postmenopausal): Secondary | ICD-10-CM | POA: Diagnosis not present

## 2017-12-29 DIAGNOSIS — R45851 Suicidal ideations: Secondary | ICD-10-CM | POA: Diagnosis present

## 2017-12-29 DIAGNOSIS — F419 Anxiety disorder, unspecified: Secondary | ICD-10-CM | POA: Diagnosis present

## 2017-12-29 DIAGNOSIS — E119 Type 2 diabetes mellitus without complications: Secondary | ICD-10-CM | POA: Diagnosis present

## 2017-12-29 DIAGNOSIS — R45850 Homicidal ideations: Secondary | ICD-10-CM | POA: Diagnosis present

## 2017-12-29 DIAGNOSIS — F329 Major depressive disorder, single episode, unspecified: Secondary | ICD-10-CM | POA: Diagnosis present

## 2017-12-29 DIAGNOSIS — F70 Mild intellectual disabilities: Secondary | ICD-10-CM | POA: Diagnosis present

## 2017-12-29 DIAGNOSIS — Z7984 Long term (current) use of oral hypoglycemic drugs: Secondary | ICD-10-CM | POA: Diagnosis not present

## 2017-12-29 DIAGNOSIS — E039 Hypothyroidism, unspecified: Secondary | ICD-10-CM | POA: Diagnosis present

## 2017-12-29 DIAGNOSIS — K219 Gastro-esophageal reflux disease without esophagitis: Secondary | ICD-10-CM | POA: Diagnosis present

## 2017-12-29 DIAGNOSIS — Z5181 Encounter for therapeutic drug level monitoring: Secondary | ICD-10-CM | POA: Diagnosis not present

## 2017-12-29 DIAGNOSIS — F203 Undifferentiated schizophrenia: Principal | ICD-10-CM | POA: Diagnosis present

## 2017-12-29 DIAGNOSIS — F79 Unspecified intellectual disabilities: Secondary | ICD-10-CM | POA: Diagnosis present

## 2017-12-29 DIAGNOSIS — Z794 Long term (current) use of insulin: Secondary | ICD-10-CM | POA: Diagnosis not present

## 2017-12-29 DIAGNOSIS — F209 Schizophrenia, unspecified: Secondary | ICD-10-CM | POA: Diagnosis not present

## 2017-12-29 LAB — GLUCOSE, CAPILLARY: Glucose-Capillary: 146 mg/dL — ABNORMAL HIGH (ref 70–99)

## 2017-12-29 MED ORDER — MAGNESIUM HYDROXIDE 400 MG/5ML PO SUSP: 30.0000 mL | Freq: Every day | ORAL | Status: DC | PRN

## 2017-12-29 MED ORDER — NICOTINE 21 MG/24HR TD PT24
21.0000 mg | MEDICATED_PATCH | Freq: Every day | TRANSDERMAL | Status: DC
  Administered 2018-01-01: 21 mg via TRANSDERMAL
  Filled 2017-12-29 (×4): qty 1

## 2017-12-29 MED ORDER — DULOXETINE HCL 30 MG PO CPEP
60.0000 mg | ORAL_CAPSULE | Freq: Every day | ORAL | Status: DC
  Administered 2017-12-29 – 2018-01-03 (×6): 60 mg via ORAL
  Filled 2017-12-29 (×6): qty 2

## 2017-12-29 MED ORDER — LISINOPRIL 20 MG PO TABS
10.0000 mg | ORAL_TABLET | Freq: Every day | ORAL | Status: DC
  Administered 2017-12-29 – 2018-01-03 (×5): 10 mg via ORAL
  Filled 2017-12-29 (×6): qty 1

## 2017-12-29 MED ORDER — PANTOPRAZOLE SODIUM 40 MG PO TBEC
40.0000 mg | DELAYED_RELEASE_TABLET | Freq: Every day | ORAL | Status: DC
  Administered 2017-12-29 – 2018-01-03 (×6): 40 mg via ORAL
  Filled 2017-12-29 (×6): qty 1

## 2017-12-29 MED ORDER — METFORMIN HCL 500 MG PO TABS
500.0000 mg | ORAL_TABLET | Freq: Two times a day (BID) | ORAL | Status: DC
  Administered 2017-12-30 – 2018-01-03 (×9): 500 mg via ORAL
  Filled 2017-12-29 (×9): qty 1

## 2017-12-29 MED ORDER — CARVEDILOL 6.25 MG PO TABS
6.2500 mg | ORAL_TABLET | Freq: Two times a day (BID) | ORAL | Status: DC
  Administered 2017-12-30 – 2018-01-03 (×9): 6.25 mg via ORAL
  Filled 2017-12-29 (×9): qty 1

## 2017-12-29 MED ORDER — LAMOTRIGINE 100 MG PO TABS
100.0000 mg | ORAL_TABLET | Freq: Two times a day (BID) | ORAL | Status: DC
  Administered 2017-12-29 – 2018-01-03 (×10): 100 mg via ORAL
  Filled 2017-12-29 (×10): qty 1

## 2017-12-29 MED ORDER — LEVOTHYROXINE SODIUM 75 MCG PO TABS
150.0000 ug | ORAL_TABLET | Freq: Every day | ORAL | Status: DC
  Administered 2017-12-30 – 2018-01-03 (×5): 150 ug via ORAL
  Filled 2017-12-29 (×5): qty 2

## 2017-12-29 MED ORDER — ACETAMINOPHEN 325 MG PO TABS
650.0000 mg | ORAL_TABLET | Freq: Four times a day (QID) | ORAL | Status: DC | PRN
  Administered 2018-01-02 – 2018-01-03 (×3): 650 mg via ORAL
  Filled 2017-12-29 (×3): qty 2

## 2017-12-29 MED ORDER — GABAPENTIN 300 MG PO CAPS
600.0000 mg | ORAL_CAPSULE | Freq: Three times a day (TID) | ORAL | Status: DC
  Administered 2017-12-30 – 2018-01-03 (×13): 600 mg via ORAL
  Filled 2017-12-29 (×13): qty 2

## 2017-12-29 MED ORDER — INSULIN DETEMIR 100 UNIT/ML ~~LOC~~ SOLN
15.0000 [IU] | Freq: Two times a day (BID) | SUBCUTANEOUS | Status: DC
  Administered 2017-12-29 – 2018-01-03 (×8): 15 [IU] via SUBCUTANEOUS
  Filled 2017-12-29 (×11): qty 0.15

## 2017-12-29 MED ORDER — INSULIN ASPART 100 UNIT/ML ~~LOC~~ SOLN: 0.0000 [IU] | Freq: Every day | SUBCUTANEOUS | Status: DC

## 2017-12-29 MED ORDER — ALUM & MAG HYDROXIDE-SIMETH 200-200-20 MG/5ML PO SUSP
30.0000 mL | ORAL | Status: DC | PRN
  Administered 2017-12-31: 30 mL via ORAL
  Filled 2017-12-29: qty 30

## 2017-12-29 MED ORDER — INSULIN ASPART 100 UNIT/ML ~~LOC~~ SOLN
0.0000 [IU] | Freq: Three times a day (TID) | SUBCUTANEOUS | Status: DC
  Administered 2017-12-30 – 2017-12-31 (×2): 2 [IU] via SUBCUTANEOUS
  Administered 2017-12-31 – 2018-01-02 (×4): 3 [IU] via SUBCUTANEOUS
  Administered 2018-01-02: 5 [IU] via SUBCUTANEOUS
  Administered 2018-01-02 – 2018-01-03 (×2): 2 [IU] via SUBCUTANEOUS
  Filled 2017-12-29 (×4): qty 1

## 2017-12-29 MED ORDER — HYDROXYZINE HCL 25 MG PO TABS
25.0000 mg | ORAL_TABLET | Freq: Three times a day (TID) | ORAL | Status: DC | PRN
  Administered 2018-01-01 – 2018-01-02 (×3): 25 mg via ORAL
  Filled 2017-12-29 (×3): qty 1

## 2017-12-29 MED ORDER — DOCUSATE SODIUM 100 MG PO CAPS
100.0000 mg | ORAL_CAPSULE | Freq: Two times a day (BID) | ORAL | Status: DC
  Administered 2017-12-29 – 2018-01-03 (×10): 100 mg via ORAL
  Filled 2017-12-29 (×10): qty 1

## 2017-12-29 MED ORDER — TRAZODONE HCL 100 MG PO TABS
100.0000 mg | ORAL_TABLET | Freq: Every evening | ORAL | Status: DC | PRN
  Administered 2017-12-30 – 2018-01-02 (×4): 100 mg via ORAL
  Filled 2017-12-29 (×4): qty 1

## 2017-12-29 NOTE — Tx Team (Signed)
Initial Treatment Plan 12/29/2017 9:48 PM BLESSYN [place] YYF:[id number]    PATIENT STRESSORS: Medication change or noncompliance Other: auditory hallucinations, suicidal thoughts   PATIENT STRENGTHS: Ability for insight Active sense of humor Communication skills Motivation for treatment/growth Supportive family/friends   PATIENT IDENTIFIED PROBLEMS: Psychosis 12/29/17  Suicidal thoughts 12/29/17  Depression 12/29/17  Anxiety 12/29/17               DISCHARGE CRITERIA:  Improved stabilization in mood, thinking, and/or behavior Motivation to continue treatment in a less acute level of care Need for constant or close observation no longer present Verbal commitment to aftercare and medication compliance  PRELIMINARY DISCHARGE PLAN: Outpatient therapy Return to previous living arrangement  PATIENT/FAMILY INVOLVEMENT: This treatment plan has been presented to and reviewed with the patient, Jessica [name].  The patient has been given the opportunity to ask questions and make suggestions.  Reyes Ivan, RN 12/29/2017, 9:48 PM
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
## 2017-12-29 NOTE — Progress Notes (Signed)
Patient ID: Jessica [name], female   DOB: 08/09/1965, 52 y.o.   MRN: [id number] Per State regulations 482.30 this chart was reviewed for medical necessity with respect to the patient's admission/duration of stay.    Next review date: 01/02/18  Debarah Crape, BSN, RN-BC  Case Manager

## 2017-12-29 NOTE — BH Assessment (Signed)
Patient has been accepted to [facility].  Accepting physician is Dr. Bary Leriche.  Attending Physician will be Dr. Bary Leriche.  Patient has been assigned to room 324, by [place].   Call report to [phone].  Representative/Transfer Coordinator is Atilano Median Patient pre-admitted by [facility] Patient Access Elberta Fortis)   [facility] ER Staff Judson Roch, CSW) made aware of acceptance.
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
## 2017-12-29 NOTE — Plan of Care (Signed)
Patient just recently admitted to the unit. Patient has not had sufficient time to show progressions at this time. Will continue to monitor for progressions. Pt. Does however contract verbally for safety and reports she can remain safe while on the unit.    Problem: Education: Goal: Will be free of psychotic symptoms Outcome: Not Progressing Goal: Knowledge of the prescribed therapeutic regimen will improve Outcome: Not Progressing   Problem: Coping: Goal: Coping ability will improve Outcome: Not Progressing Goal: Will verbalize feelings Outcome: Not Progressing   Problem: Health Behavior/Discharge Planning: Goal: Compliance with prescribed medication regimen will improve Outcome: Not Progressing   Problem: Self-Concept: Goal: Ability to disclose and discuss suicidal ideas will improve Outcome: Not Progressing   Problem: Safety: Goal: Periods of time without injury will increase Outcome: Not Progressing   Problem: Coping: Goal: Ability to identify and develop effective coping behavior will improve Outcome: Not Progressing

## 2017-12-29 NOTE — Progress Notes (Signed)
Precautionary checks every 15 minutes for safety maintained, room free of safety hazards, patient sustains no injury or falls during this shift. Will endorse care to next shift.   

## 2017-12-29 NOTE — BH Assessment (Signed)
Patient has been accepted to [facility].  Accepting physician is Dr. Bary Leriche.  Patient pending bed assignment & attending physician.

## 2017-12-30 DIAGNOSIS — F203 Undifferentiated schizophrenia: Principal | ICD-10-CM

## 2017-12-30 LAB — GLUCOSE, CAPILLARY
GLUCOSE-CAPILLARY: 129 mg/dL — AB (ref 70–99)
Glucose-Capillary: 111 mg/dL — ABNORMAL HIGH (ref 70–99)
Glucose-Capillary: 116 mg/dL — ABNORMAL HIGH (ref 70–99)
Glucose-Capillary: 101 mg/dL — ABNORMAL HIGH (ref 70–99)

## 2017-12-30 LAB — TSH: TSH: 0.87 u[IU]/mL (ref 0.350–4.500)

## 2017-12-30 LAB — LIPID PANEL
Cholesterol: 157 mg/dL (ref 0–200)
HDL: 44 mg/dL (ref >40)
LDL Cholesterol: 90 mg/dL (ref 0–99)
Total CHOL/HDL Ratio: 3.6 RATIO
Triglycerides: 114 mg/dL (ref <150)
VLDL: 23 mg/dL (ref 0–40)

## 2017-12-30 LAB — HEMOGLOBIN A1C
Hgb A1c MFr Bld: 5.2 % (ref 4.8–5.6)
Mean Plasma Glucose: 102.54 mg/dL

## 2017-12-30 MED ORDER — LURASIDONE HCL 80 MG PO TABS
80.0000 mg | ORAL_TABLET | Freq: Every day | ORAL | Status: DC
  Administered 2017-12-30: 80 mg via ORAL
  Filled 2017-12-30: qty 1

## 2017-12-30 MED ORDER — LURASIDONE HCL 80 MG PO TABS
120.0000 mg | ORAL_TABLET | Freq: Every day | ORAL | Status: DC
  Administered 2017-12-31 – 2018-01-03 (×4): 120 mg via ORAL
  Filled 2017-12-30 (×4): qty 1

## 2017-12-30 NOTE — H&P (Signed)
Psychiatric Admission Assessment Adult  Patient Identification: Jessica [name] MRN:  [id number] Date of Evaluation:  12/30/2017 Chief Complaint:  schizophrenia Principal Diagnosis: Undifferentiated schizophrenia ([place]) Diagnosis:  Principal Problem:   Undifferentiated schizophrenia ([place]) Active Problems:   Diabetes mellitus without complication ([place])   Mild intellectual disability  History of Present Illness:   Identifying data. Jessica [name] is a 52 year old female with a history of mild intellectual disability and schizophrenia.  Chief complaint. "I am depressed."  History of present illness. Information was obtained from the patient and the chart. The patient came to [facility] ER from her day program after she complained of suicidal ideation with a plan to step in front of a train in response to auditory command hallucinations. The patient came to our ER two days prior complaining of suicidal and homicidal ideation but returned to her group home with reassurance. She reports 6 months of hallucination that she did not mention to anybody. She lists conflict with her room mate as a major stress. No substance use.  Past psychiatric history. Long history of mental illness, medication trials and hospitalizations. Serious suicide attempt in 2000 by Seroquel overdose.  Family psychiatric history. Denies.  Social history. Has been living in the same group home since December 2018. The room mate is young and bosses her around.  Total Time spent with patient: 1 hour  Is the patient at risk to self? Yes.    Has the patient been a risk to self in the past 6 months? No.  Has the patient been a risk to self within the distant past? Yes.    Is the patient a risk to others? No.  Has the patient been a risk to others in the past 6 months? No.  Has the patient been a risk to others within the distant past? No.   Prior Inpatient Therapy:   Prior Outpatient Therapy:    Alcohol Screening: 1.  How often do you have a drink containing alcohol?: Never 2. How many drinks containing alcohol do you have on a typical day when you are drinking?: 1 or 2 3. How often do you have six or more drinks on one occasion?: Never AUDIT-C Score: 0 4. How often during the last year have you found that you were not able to stop drinking once you had started?: Never 5. How often during the last year have you failed to do what was normally expected from you becasue of drinking?: Never 6. How often during the last year have you needed a first drink in the morning to get yourself going after a heavy drinking session?: Never 7. How often during the last year have you had a feeling of guilt of remorse after drinking?: Never 8. How often during the last year have you been unable to remember what happened the night before because you had been drinking?: Never 9. Have you or someone else been injured as a result of your drinking?: No 10. Has a relative or friend or a doctor or another health worker been concerned about your drinking or suggested you cut down?: No Alcohol Use Disorder Identification Test Final Score (AUDIT): 0 Intervention/Follow-up: AUDIT Score <7 follow-up not indicated Substance Abuse History in the last 12 months:  No. Consequences of Substance Abuse: NA Previous Psychotropic Medications: Yes  Psychological Evaluations: No  Past Medical History:  Past Medical History:  Diagnosis Date  . Anxiety   . Depression   . Diabetes mellitus without complication ([place])   . GERD (  gastroesophageal reflux disease)   . Hypertension    History reviewed. No pertinent surgical history. Family History: History reviewed. No pertinent family history.  Tobacco Screening: Have you used any form of tobacco in the last 30 days? (Cigarettes, Smokeless Tobacco, Cigars, and/or Pipes): No Social History:  Social History   Substance and Sexual Activity  Alcohol Use No  . Frequency: Never     Social History    Substance and Sexual Activity  Drug Use Yes  . Types: Marijuana, "Crack" cocaine   Comment: reports she has not done anything in a long time    Additional Social History:                           Allergies:   Allergies  Allergen Reactions  . Abilify [Aripiprazole]     Chokes on food  . Haldol [Haloperidol] Other (See Comments)    Dizziness  . Risperdal [Risperidone]     Leg weakness   Lab Results:  Results for orders placed or performed during the hospital encounter of 12/29/17 (from the past 48 hour(s))  Glucose, capillary     Status: Abnormal   Collection Time: 12/29/17  8:34 PM  Result Value Ref Range   Glucose-Capillary 146 (H) 70 - 99 mg/dL  Lipid panel     Status: None   Collection Time: 12/30/17  6:38 AM  Result Value Ref Range   Cholesterol 157 0 - 200 mg/dL   Triglycerides 114 <150 mg/dL   HDL 44 >40 mg/dL   Total CHOL/HDL Ratio 3.6 RATIO   VLDL 23 0 - 40 mg/dL   LDL Cholesterol 90 0 - 99 mg/dL    Comment:        Total Cholesterol/HDL:CHD Risk Coronary Heart Disease Risk Table                     Men   Women  1/2 Average Risk   3.4   3.3  Average Risk       5.0   4.4  2 X Average Risk   9.6   7.1  3 X Average Risk  23.4   11.0        Use the calculated Patient Ratio above and the CHD Risk Table to determine the patient's CHD Risk.        ATP III CLASSIFICATION (LDL):  <100     mg/dL   Optimal  100-129  mg/dL   Near or Above                    Optimal  130-159  mg/dL   Borderline  160-189  mg/dL   High  >190     mg/dL   Very High Performed at [facility], [address] [place], [place] [address]   Hemoglobin A1c     Status: None   Collection Time: 12/30/17  6:38 AM  Result Value Ref Range   Hgb A1c MFr Bld 5.2 4.8 - 5.6 %    Comment: (NOTE) Pre diabetes:          5.7%-6.4% Diabetes:              >6.4% Glycemic control for   <7.0% adults with diabetes    Mean Plasma Glucose 102.54 mg/dL    Comment: Performed at  [facility] [address], [place], [place] [address]  TSH     Status: None  Collection Time: 12/30/17  6:38 AM  Result Value Ref Range   TSH 0.870 0.350 - 4.500 uIU/mL    Comment: Performed by a 3rd Generation assay with a functional sensitivity of <=0.01 uIU/mL. Performed at [facility], [address] [place], [place] [address]   Glucose, capillary     Status: Abnormal   Collection Time: 12/30/17  7:01 AM  Result Value Ref Range   Glucose-Capillary 111 (H) 70 - 99 mg/dL   Comment 1 Notify RN   Glucose, capillary     Status: Abnormal   Collection Time: 12/30/17 11:36 AM  Result Value Ref Range   Glucose-Capillary 129 (H) 70 - 99 mg/dL    Blood Alcohol level:  Lab Results  Component Value Date   ETH <10 12/26/2017   ETH <10 54/27/0623    Metabolic Disorder Labs:  Lab Results  Component Value Date   HGBA1C 5.2 12/30/2017   MPG 102.54 12/30/2017   MPG 134 01/03/2017   No results found for: PROLACTIN Lab Results  Component Value Date   CHOL 157 12/30/2017   TRIG 114 12/30/2017   HDL 44 12/30/2017   CHOLHDL 3.6 12/30/2017   VLDL 23 12/30/2017   LDLCALC 90 12/30/2017   LDLCALC 134 (H) 01/03/2017    Current Medications: Current Facility-Administered Medications  Medication Dose Route Frequency Provider Last Rate Last Dose  . acetaminophen (TYLENOL) tablet 650 mg  650 mg Oral Q6H PRN Jaemarie Hochberg B, MD      . alum & mag hydroxide-simeth (MAALOX/MYLANTA) 200-200-20 MG/5ML suspension 30 mL  30 mL Oral Q4H PRN Rodrigues Urbanek B, MD      . carvedilol (COREG) tablet 6.25 mg  6.25 mg Oral BID WC Sascha Palma B, MD   6.25 mg at 12/30/17 0906  . docusate sodium (COLACE) capsule 100 mg  100 mg Oral BID Jeramey Lanuza B, MD   100 mg at 12/30/17 0906  . DULoxetine (CYMBALTA) DR capsule 60 mg  60 mg Oral Daily Azion Centrella B, MD   60 mg at 12/30/17 0905  . gabapentin (NEURONTIN) capsule 600 mg  600 mg Oral TID Damyan Corne  B, MD   600 mg at 12/30/17 1146  . hydrOXYzine (ATARAX/VISTARIL) tablet 25 mg  25 mg Oral TID PRN Iria Jamerson B, MD      . insulin aspart (novoLOG) injection 0-15 Units  0-15 Units Subcutaneous TID WC Alahni Varone B, MD   2 Units at 12/30/17 1141  . insulin aspart (novoLOG) injection 0-5 Units  0-5 Units Subcutaneous QHS Kayshawn Ozburn B, MD      . insulin detemir (LEVEMIR) injection 15 Units  15 Units Subcutaneous BID Woodrow Dulski B, MD   15 Units at 12/30/17 1100  . lamoTRIgine (LAMICTAL) tablet 100 mg  100 mg Oral BID Karlei Waldo B, MD   100 mg at 12/30/17 0905  . levothyroxine (SYNTHROID, LEVOTHROID) tablet 150 mcg  150 mcg Oral Q0600 Lynn Sissel B, MD   150 mcg at 12/30/17 0642  . lisinopril (PRINIVIL,ZESTRIL) tablet 10 mg  10 mg Oral Daily Nkosi Cortright B, MD   10 mg at 12/30/17 0904  . lurasidone (LATUDA) tablet 80 mg  80 mg Oral Q breakfast Dontavian Marchi B, MD   80 mg at 12/30/17 0906  . magnesium hydroxide (MILK OF MAGNESIA) suspension 30 mL  30 mL Oral Daily PRN Cliff Damiani B, MD      . metFORMIN (GLUCOPHAGE) tablet 500 mg  500 mg Oral BID WC  Orson Slick B, MD   500 mg at 12/30/17 0905  . nicotine (NICODERM CQ - dosed in mg/24 hours) patch 21 mg  21 mg Transdermal Q0600 Rishab Stoudt B, MD      . pantoprazole (PROTONIX) EC tablet 40 mg  40 mg Oral Daily Velna Hedgecock B, MD   40 mg at 12/30/17 0906  . traZODone (DESYREL) tablet 100 mg  100 mg Oral QHS PRN Catheleen Langhorne B, MD       PTA Medications: Medications Prior to Admission  Medication Sig Dispense Refill Last Dose  . acetaminophen (TYLENOL) 325 MG tablet Take 2 tablets (650 mg total) by mouth every 6 (six) hours as needed for mild pain or headache.   PRN at PRN  . benztropine (COGENTIN) 1 MG tablet Take 1 tablet (1 mg total) by mouth 2 (two) times daily. For prevention of drug induced tremors (Patient not taking: Reported on 07/09/2017) 60 tablet  0 Not Taking at Unknown time  . carvedilol (COREG) 6.25 MG tablet Take 1 tablet (6.25 mg total) by mouth 2 (two) times daily. For high blood pressure 15 tablet 0 07/09/2017 at Unknown time  . DULoxetine (CYMBALTA) 60 MG capsule Take 1 capsule (60 mg total) by mouth daily. For depression 30 capsule 0 07/09/2017 at Unknown time  . ferrous sulfate (FEROSUL) 325 (65 FE) MG tablet Take 1 tablet (325 mg total) by mouth 2 (two) times daily. For anemia 30 tablet 0 07/09/2017 at Unknown time  . gabapentin (NEURONTIN) 600 MG tablet Take 1 tablet (600 mg total) by mouth 3 (three) times daily. For agitation (Patient not taking: Reported on 07/09/2017) 90 tablet 0 Not Taking at Unknown time  . hydrOXYzine (ATARAX/VISTARIL) 25 MG tablet Take 1 tablet (25 mg total) by mouth 3 (three) times daily as needed for anxiety. (Patient not taking: Reported on 07/09/2017) 60 tablet 0 Not Taking at Unknown time  . lamoTRIgine (LAMICTAL) 100 MG tablet Take 1 tablet (100 mg total) by mouth at bedtime. For mood stabilization 30 tablet 0 07/08/2017 at Unknown time  . LEVEMIR FLEXTOUCH 100 UNIT/ML Pen INJECT 30 UNITS UNDER SKIN EVERY TWELVE HOURS: For diabetes managment (Patient taking differently: Inject 15 Units into the skin 2 (two) times daily. ) 15 mL 0 07/09/2017 at Unknown time  . levothyroxine (SYNTHROID, LEVOTHROID) 150 MCG tablet Take 1 tablet (150 mcg total) by mouth daily. For thyroid function hormone replacement 15 tablet 0 07/09/2017 at Unknown time  . lisinopril (PRINIVIL,ZESTRIL) 10 MG tablet Take 1 tablet (10 mg total) by mouth daily. For high blood pressure 15 tablet 0 07/09/2017 at Unknown time  . loratadine (CLARITIN) 10 MG tablet Take 1 tablet (10 mg total) by mouth daily. For allergies 15 tablet 0 07/09/2017 at Unknown time  . lurasidone (LATUDA) 80 MG TABS tablet Take 1 tablet (80 mg total) by mouth 2 (two) times daily after a meal. For mood control (Patient taking differently: Take 80 mg by mouth at bedtime. ) 60 tablet 0  07/08/2017 at Unknown time  . metFORMIN (GLUCOPHAGE) 500 MG tablet Take 1 tablet (500 mg total) by mouth 2 (two) times daily. For diabetes management 30 tablet 0 07/09/2017 at Unknown time  . naproxen (NAPROSYN) 500 MG tablet Take 1 tablet (500 mg total) by mouth 2 (two) times daily with a meal. 20 tablet 2   . OS-CAL CALCIUM + D3 500-200 MG-UNIT TABS Take 1 tablet by mouth 2 (two) times daily. (Patient not taking: Reported on 07/09/2017) 30 tablet 0  Not Taking at Unknown time  . pantoprazole (PROTONIX) 40 MG tablet Take 1 tablet (40 mg total) by mouth daily. For acid reflux 15 tablet 0 07/09/2017 at Unknown time  . RESTASIS 0.05 % ophthalmic emulsion INSTILL ONE DROP IN AFFECTED EYE TWICE DAILY AS DIRECTED: eye irritation (Patient not taking: Reported on 07/09/2017) 0.4 mL 0 Not Taking at Unknown time  . traZODone (DESYREL) 100 MG tablet Take 1 tablet (100 mg total) by mouth at bedtime as needed for sleep. 30 tablet 0 07/08/2017 at Unknown time  . valACYclovir (VALTREX) 500 MG tablet Take 1 tablet (500 mg total) by mouth daily. For herpes simplex infection (Patient not taking: Reported on 07/09/2017) 10 tablet 0 Not Taking at Unknown time    Musculoskeletal: Strength & Muscle Tone: within normal limits Gait & Station: normal Patient leans: N/A  Psychiatric Specialty Exam: Physical Exam  Nursing note and vitals reviewed. Constitutional: She is oriented to person, place, and time. She appears well-developed and well-nourished.  HENT:  Head: Normocephalic and atraumatic.  Eyes: Pupils are equal, round, and reactive to light. EOM are normal.  Neck: Normal range of motion. Neck supple.  Cardiovascular: Normal rate and regular rhythm.  Respiratory: Effort normal and breath sounds normal.  GI: Soft. Bowel sounds are normal.  Musculoskeletal: Normal range of motion.  Neurological: She is alert and oriented to person, place, and time.  Skin: Skin is warm and dry.  Psychiatric: Her speech is normal. Her  affect is blunt. She is withdrawn and actively hallucinating. Cognition and memory are impaired. She expresses impulsivity. She exhibits a depressed mood. She expresses suicidal ideation. She expresses suicidal plans.    Review of Systems  Neurological: Negative.   Psychiatric/Behavioral: Positive for depression, hallucinations and suicidal ideas.  All other systems reviewed and are negative.   Blood pressure 125/67, pulse 87, temperature 98.6 F (37 C), temperature source Oral, resp. rate 18, height 4\' 11"  (1.499 m), weight 68.9 kg, SpO2 100 %.Body mass index is 30.7 kg/m.  General Appearance: Casual  Eye Contact:  Good  Speech:  Clear and Coherent  Volume:  Decreased  Mood:  Depressed  Affect:  Blunt  Thought Process:  Goal Directed and Descriptions of Associations: Intact  Orientation:  Full (Time, Place, and Person)  Thought Content:  Hallucinations: Auditory  Suicidal Thoughts:  Yes.  with intent/plan  Homicidal Thoughts:  No  Memory:  Immediate;   Poor Recent;   Poor Remote;   Poor  Judgement:  Poor  Insight:  Lacking  Psychomotor Activity:  Psychomotor Retardation  Concentration:  Concentration: Poor and Attention Span: Poor  Recall:  Poor  Fund of Knowledge:  Fair  Language:  Fair  Akathisia:  No  Handed:  Right  AIMS (if indicated):     Assets:  Communication Skills Desire for Improvement Physical Health Resilience Social Support  ADL's:  Intact  Cognition:  WNL  Sleep:  Number of Hours: 7.15    Treatment Plan Summary: Daily contact with patient to assess and evaluate symptoms and progress in treatment and Medication management   Ms. Stitt is a 52 year old female with a history of schizophrenia admitted for suicidal ideation [place]   #Suicidal ideation -patient is able to contract for safety in the hospital  #Psychosis -increase Latuda to 120 mg daily -Cymbalta 60 mg daily -Lamictal 100 mg BID -Trazodone 100 mg  nightly  #Hypothyroid -Synthroid 150 ucg daily  #DM -Lantus 15 units BID -Metformin 500  mg BID -ADA diet, SSI -Neurontin 600 mg TID  #HTN -continue antihypertensives  #GERD -Protonix 40 mg daily  #Smoking cessation -nicotine patch is available  #Labs -lipid panel, TSH, A1C -EKG  #Disposition -discharge back to her group home -follow up with primary psychiatrist   Observation Level/Precautions:  15 minute checks  Laboratory:  CBC Chemistry Profile UDS UA  Psychotherapy:    Medications:    Consultations:    Discharge Concerns:    Estimated LOS:  Other:     Physician Treatment Plan for Primary Diagnosis: Undifferentiated schizophrenia ([place]) Long Term Goal(s): Improvement in symptoms so as ready for discharge  Short Term Goals: Ability to identify changes in lifestyle to reduce recurrence of condition will improve, Ability to verbalize feelings will improve, Ability to disclose and discuss suicidal ideas, Ability to demonstrate self-control will improve, Ability to identify and develop effective coping behaviors will improve, Ability to maintain clinical measurements within normal limits will improve and Ability to identify triggers associated with substance abuse/mental health issues will improve  Physician Treatment Plan for Secondary Diagnosis: Principal Problem:   Undifferentiated schizophrenia ([place]) Active Problems:   Diabetes mellitus without complication ([place])   Mild intellectual disability  Long Term Goal(s): Improvement in symptoms so as ready for discharge  Short Term Goals: NA  I certify that inpatient services furnished can reasonably be expected to improve the patient's condition.    Orson Slick, MD 12/12/20193:06 PM

## 2017-12-30 NOTE — Progress Notes (Signed)
Recreation Therapy Notes  Date: 12/30/2017  Time: 9:30 am  Location: Craft Room  Behavioral response: Appropriate  Intervention Topic: Coping skills  Discussion/Intervention:  and when they can be used. Individuals described how they normally cope with things and the coping skills they normally use. Patients expressed why it is important to cope with things and how not coping with things can affect you. The group participated in the intervention "Exploring coping skills" where they had a chance to test new coping skills they could use in the future.  Clinical Observations/Feedback:  Patient came to group and defined coping skills as coping with life, illness and mind. She identified music, drawing as journaling as coping skills she uses. Individual participated in group and was social with peers and staff while participating in the intervention.  Vencent Hauschild LRT/CTRS         Harnoor Kohles 12/30/2017 11:00 AM

## 2017-12-30 NOTE — Plan of Care (Signed)
Problem: Coping: Goal: Coping ability will improve Outcome: Progressing   Problem: Safety: Goal: Periods of time without injury will increase Outcome: Progressing DAR Note: Pt visible in milieu for extended periods during this shift. Presents with blunted affect on initial approach but brightens up on interactions. Denies SI, HI, AVH and pain when assessed but endorsed SI, with +SI, +AVH on self inventory sheet. Pt verbally contracts for safety. Cooperative with care. Compliant with medications when offered. Denies side effects. Pt showered and changed scrubs earlier this shift. Rates her anxiety and depression both 9/10. Per pt "I slept good last night with the Trazodone they gave me, appetite is good with normal energy and good concentration level. Pt expressed good insight related to her mental health and diabetes symptoms "I know I have to take my medicines everyday and really watch what I eat too or else I will start hearing voices and my sugar will go up".  Emotional support offered. Encouraged pt to voice concerns, Attend to ADLs and comply with current treatment regimen including groups. All medications given per MD's orders with verbal education and effects monitored. Safety checks continues without self harm gestures or outburst to note at this time.  Pt is receptive to care. Observed in scheduled groups and was engaged. POC continues for safety and mood stability.

## 2017-12-30 NOTE — [facility] Suicide Risk Assessment (Signed)
[facility] Admission Suicide Risk Assessment   Nursing information obtained from:  Patient, Review of record Demographic factors:  Caucasian, Low socioeconomic status, Unemployed Current Mental Status:  Self-harm thoughts Loss Factors:  Financial problems / change in socioeconomic status Historical Factors:  Family history of mental illness or substance abuse Risk Reduction Factors:  Sense of responsibility to family, Positive social support, Positive therapeutic relationship  Total Time spent with patient: 1 hour Principal Problem: Undifferentiated schizophrenia ([place]) Diagnosis:  Principal Problem:   Undifferentiated schizophrenia ([place]) Active Problems:   Diabetes mellitus without complication ([place])   Mild intellectual disability  Subjective Data: suicidal ideation with a plan  Continued Clinical Symptoms:  Alcohol Use Disorder Identification Test Final Score (AUDIT): 0 The "Alcohol Use Disorders Identification Test", Guidelines for Use in Primary Care, Second Edition.  World Pharmacologist [facility]). Score between 0-7:  no or low risk or alcohol related problems. Score between 8-15:  moderate risk of alcohol related problems. Score between 16-19:  high risk of alcohol related problems. Score 20 or above:  warrants further diagnostic evaluation for alcohol dependence and treatment.   CLINICAL FACTORS:   Schizophrenia:   Command hallucinatons Depressive state   Musculoskeletal: Strength & Muscle Tone: within normal limits Gait & Station: normal Patient leans: N/A  Psychiatric Specialty Exam: Physical Exam  Nursing note and vitals reviewed. Psychiatric: Her speech is normal. Her affect is blunt. She is slowed, withdrawn and actively hallucinating. Cognition and memory are normal. She expresses impulsivity. She exhibits a depressed mood. She expresses suicidal ideation. She expresses suicidal plans.    Review of Systems  Neurological: Negative.   Psychiatric/Behavioral: Positive  for depression, hallucinations and suicidal ideas.  All other systems reviewed and are negative.   Blood pressure 125/67, pulse 87, temperature 98.6 F (37 C), temperature source Oral, resp. rate 18, height 4\' 11"  (1.499 m), weight 68.9 kg, SpO2 100 %.Body mass index is 30.7 kg/m.  General Appearance: Casual  Eye Contact:  Good  Speech:  Clear and Coherent  Volume:  Normal  Mood:  Depressed  Affect:  Blunt  Thought Process:  Goal Directed and Descriptions of Associations: Intact  Orientation:  Full (Time, Place, and Person)  Thought Content:  Hallucinations: Auditory  Suicidal Thoughts:  Yes.  with intent/plan  Homicidal Thoughts:  No  Memory:  Immediate;   Poor Recent;   Poor Remote;   Poor  Judgement:  Poor  Insight:  Lacking  Psychomotor Activity:  Decreased  Concentration:  Concentration: Poor and Attention Span: Poor  Recall:  Poor  Fund of Knowledge:  Poor  Language:  Fair  Akathisia:  No  Handed:  Right  AIMS (if indicated):     Assets:  Communication Skills Desire for Improvement Financial Resources/Insurance Housing Physical Health Resilience Social Support  ADL's:  Intact  Cognition:  WNL  Sleep:  Number of Hours: 7.15      COGNITIVE FEATURES THAT CONTRIBUTE TO RISK:  None    SUICIDE RISK:   Mild:  Suicidal ideation of limited frequency, intensity, duration, and specificity.  There are no identifiable plans, no associated intent, mild dysphoria and related symptoms, good self-control (both objective and subjective assessment), few other risk factors, and identifiable protective factors, including available and accessible social support.  PLAN OF CARE: hospital admission, medication management, discharge planning.  Ms. Schneck is a 52 year old female with a history of schizophrenia admitted for suicidal ideation [place].   #Suicidal ideation -patient is able to  contract for safety in the hospital  #Psychosis -increase  Latuda to 120 mg daily -Cymbalta 60 mg daily -Lamictal 100 mg BID -Trazodone 100 mg nightly  #Hypothyroid -Synthroid 150 ucg daily  #DM -Lantus 15 units BID -Metformin 500 mg BID -ADA diet, SSI -Neurontin 600 mg TID  #HTN -continue antihypertensives  #GERD -Protonix 40 mg daily  #Smoking cessation -nicotine patch is available  #Labs -lipid panel, TSH, A1C -EKG  #Disposition -discharge back to her group home -follow up with primary psychiatrist  I certify that inpatient services furnished can reasonably be expected to improve the patient's condition.   Orson Slick, MD 12/30/2017, 2:51 PM

## 2017-12-31 LAB — GLUCOSE, CAPILLARY
GLUCOSE-CAPILLARY: 154 mg/dL — AB (ref 70–99)
Glucose-Capillary: 166 mg/dL — ABNORMAL HIGH (ref 70–99)
Glucose-Capillary: 123 mg/dL — ABNORMAL HIGH (ref 70–99)
Glucose-Capillary: 99 mg/dL (ref 70–99)
Glucose-Capillary: 151 mg/dL — ABNORMAL HIGH (ref 70–99)

## 2017-12-31 NOTE — [facility] Group Notes (Signed)
[facility] Group Notes:  (Nursing/MHT/Case Management/Adjunct)  Date:  12/31/2017  Time:  2:54 PM  Type of Therapy:  Psychoeducational Skills  Participation Level:  Minimal   Participation Quality:  Inattentive  Affect:  Flat  Cognitive:  Lacking  Insight:  Limited  Engagement in Group:  Lacking  Modes of Intervention:  Activity and Education  Summary of Progress/Problems: Came in for 5 minutes and left Jessica [name] Nahzir Pohle 12/31/2017, 2:54 PM

## 2017-12-31 NOTE — Progress Notes (Signed)
   12/31/17 1030  Clinical Encounter Type  Visited With Patient;Health care provider  Visit Type Initial;Spiritual support;Behavioral Health  Referral From Social work  Consult/Referral To Chaplain  Spiritual Encounters  Spiritual Needs Emotional;Other (Comment)   Chance attended the patient's treatment team meeting. The MD has increased the patient's medication and the patient will be observed over the weekend. The plan is to discharge her back to her previous group home. The patient stated that her goal was to get better control of her illness.

## 2017-12-31 NOTE — Plan of Care (Signed)
Patient stated that she wasn't hearing voices or seeing anything anymore. Patient denies SI/HI and states that she is feeling a lot better.   Problem: Education: Goal: Will be free of psychotic symptoms Outcome: Progressing   Problem: Self-Concept: Goal: Ability to disclose and discuss suicidal ideas will improve Outcome: Progressing

## 2017-12-31 NOTE — Progress Notes (Signed)
[facility] MD Progress Note  12/31/2017 9:23 AM Jessica [name]  MRN:  [id number]  Subjective:    Jessica [name] has a history of mental illness and intellectual disability admitted for suicidal and homicidal threats. She asked for increase in Latuda dose.  Jessica [name] met with treatment team today. She is cool and collected, pleasant, cooperative but somewhat intrussive. She tolerates medications well and has no somatic complaints. She hopes to return to her group home on Monday and has been already calling her group home for pick up.  Principal Problem: Undifferentiated schizophrenia ([place]) Diagnosis: Principal Problem:   Undifferentiated schizophrenia ([place]) Active Problems:   Diabetes mellitus without complication ([place])   Mild intellectual disability  Total Time spent with patient: 20 minutes  Past Psychiatric History: schizophrenia  Past Medical History:  Past Medical History:  Diagnosis Date  . Anxiety   . Depression   . Diabetes mellitus without complication ([place])   . GERD (gastroesophageal reflux disease)   . Hypertension    History reviewed. No pertinent surgical history. Family History: History reviewed. No pertinent family history. Family Psychiatric  History: unknown Social History:  Social History   Substance and Sexual Activity  Alcohol Use No  . Frequency: Never     Social History   Substance and Sexual Activity  Drug Use Yes  . Types: Marijuana, "Crack" cocaine   Comment: reports she has not done anything in a long time    Social History   Socioeconomic History  . Marital status: Single    Spouse name: Not on file  . Number of children: Not on file  . Years of education: Not on file  . Highest education level: Not on file  Occupational History  . Not on file  Social Needs  . Financial resource strain: Not on file  . Food insecurity:    Worry: Not on file    Inability: Not on file  . Transportation needs:    Medical: Not on file    Non-medical: Not on  file  Tobacco Use  . Smoking status: Former Research scientist (life sciences)  . Smokeless tobacco: Never Used  Substance and Sexual Activity  . Alcohol use: No    Frequency: Never  . Drug use: Yes    Types: Marijuana, "Crack" cocaine    Comment: reports she has not done anything in a long time  . Sexual activity: Not on file  Lifestyle  . Physical activity:    Days per week: Not on file    Minutes per session: Not on file  . Stress: Not on file  Relationships  . Social connections:    Talks on phone: Not on file    Gets together: Not on file    Attends religious service: Not on file    Active member of club or organization: Not on file    Attends meetings of clubs or organizations: Not on file    Relationship status: Not on file  Other Topics Concern  . Not on file  Social History Narrative  . Not on file   Additional Social History:                         Sleep: Fair  Appetite:  Fair  Current Medications: Current Facility-Administered Medications  Medication Dose Route Frequency Provider Last Rate Last Dose  . acetaminophen (TYLENOL) tablet 650 mg  650 mg Oral Q6H PRN Shanti Eichel B, MD      . alum & mag  hydroxide-simeth (MAALOX/MYLANTA) 200-200-20 MG/5ML suspension 30 mL  30 mL Oral Q4H PRN Neithan Day B, MD      . carvedilol (COREG) tablet 6.25 mg  6.25 mg Oral BID WC Rivers Hamrick B, MD   6.25 mg at 12/31/17 0819  . docusate sodium (COLACE) capsule 100 mg  100 mg Oral BID Jerrald Doverspike B, MD   100 mg at 12/31/17 0819  . DULoxetine (CYMBALTA) DR capsule 60 mg  60 mg Oral Daily Hindy Perrault B, MD   60 mg at 12/31/17 0819  . gabapentin (NEURONTIN) capsule 600 mg  600 mg Oral TID Myran Arcia B, MD   600 mg at 12/31/17 0819  . hydrOXYzine (ATARAX/VISTARIL) tablet 25 mg  25 mg Oral TID PRN Kemuel Buchmann B, MD      . insulin aspart (novoLOG) injection 0-15 Units  0-15 Units Subcutaneous TID WC Ahmaad Neidhardt B, MD   3 Units at 12/31/17  0829  . insulin aspart (novoLOG) injection 0-5 Units  0-5 Units Subcutaneous QHS Camrin Lapre B, MD      . insulin detemir (LEVEMIR) injection 15 Units  15 Units Subcutaneous BID Angelique Chevalier B, MD   15 Units at 12/30/17 2137  . lamoTRIgine (LAMICTAL) tablet 100 mg  100 mg Oral BID Adarrius Graeff B, MD   100 mg at 12/31/17 0819  . levothyroxine (SYNTHROID, LEVOTHROID) tablet 150 mcg  150 mcg Oral Q0600 Azarah Dacy B, MD   150 mcg at 12/31/17 0618  . lisinopril (PRINIVIL,ZESTRIL) tablet 10 mg  10 mg Oral Daily Marshell Dilauro B, MD   10 mg at 12/31/17 9373  . lurasidone (LATUDA) tablet 120 mg  120 mg Oral Q breakfast Tacori Kvamme B, MD   120 mg at 12/31/17 0823  . magnesium hydroxide (MILK OF MAGNESIA) suspension 30 mL  30 mL Oral Daily PRN Mickel Schreur B, MD      . metFORMIN (GLUCOPHAGE) tablet 500 mg  500 mg Oral BID WC Jaymarie Yeakel B, MD   500 mg at 12/31/17 0819  . nicotine (NICODERM CQ - dosed in mg/24 hours) patch 21 mg  21 mg Transdermal Q0600 Elliannah Wayment B, MD      . pantoprazole (PROTONIX) EC tablet 40 mg  40 mg Oral Daily Darling Cieslewicz B, MD   40 mg at 12/31/17 0819  . traZODone (DESYREL) tablet 100 mg  100 mg Oral QHS PRN Chaney Ingram B, MD   100 mg at 12/30/17 2137    Lab Results:  Results for orders placed or performed during the hospital encounter of 12/29/17 (from the past 48 hour(s))  Glucose, capillary     Status: Abnormal   Collection Time: 12/29/17  8:34 PM  Result Value Ref Range   Glucose-Capillary 146 (H) 70 - 99 mg/dL  Lipid panel     Status: None   Collection Time: 12/30/17  6:38 AM  Result Value Ref Range   Cholesterol 157 0 - 200 mg/dL   Triglycerides 114 <150 mg/dL   HDL 44 >40 mg/dL   Total CHOL/HDL Ratio 3.6 RATIO   VLDL 23 0 - 40 mg/dL   LDL Cholesterol 90 0 - 99 mg/dL    Comment:        Total Cholesterol/HDL:CHD Risk Coronary Heart Disease Risk Table                     Men   Women   1/2 Average Risk   3.4   3.3  Average  Risk       5.0   4.4  2 X Average Risk   9.6   7.1  3 X Average Risk  23.4   11.0        Use the calculated Patient Ratio above and the CHD Risk Table to determine the patient's CHD Risk.        ATP III CLASSIFICATION (LDL):  <100     mg/dL   Optimal  100-129  mg/dL   Near or Above                    Optimal  130-159  mg/dL   Borderline  160-189  mg/dL   High  >190     mg/dL   Very High Performed at [facility], [address] [place], [place] [address]   Hemoglobin A1c     Status: None   Collection Time: 12/30/17  6:38 AM  Result Value Ref Range   Hgb A1c MFr Bld 5.2 4.8 - 5.6 %    Comment: (NOTE) Pre diabetes:          5.7%-6.4% Diabetes:              >6.4% Glycemic control for   <7.0% adults with diabetes    Mean Plasma Glucose 102.54 mg/dL    Comment: Performed at [facility] [address], [place], [place] [address]  TSH     Status: None   Collection Time: 12/30/17  6:38 AM  Result Value Ref Range   TSH 0.870 0.350 - 4.500 uIU/mL    Comment: Performed by a 3rd Generation assay with a functional sensitivity of <=0.01 uIU/mL. Performed at [facility], [address] [place], [place] [address]   Glucose, capillary     Status: Abnormal   Collection Time: 12/30/17  7:01 AM  Result Value Ref Range   Glucose-Capillary 111 (H) 70 - 99 mg/dL   Comment 1 Notify RN   Glucose, capillary     Status: Abnormal   Collection Time: 12/30/17 11:36 AM  Result Value Ref Range   Glucose-Capillary 129 (H) 70 - 99 mg/dL  Glucose, capillary     Status: Abnormal   Collection Time: 12/30/17  4:24 PM  Result Value Ref Range   Glucose-Capillary 116 (H) 70 - 99 mg/dL  Glucose, capillary     Status: Abnormal   Collection Time: 12/30/17  8:28 PM  Result Value Ref Range   Glucose-Capillary 101 (H) 70 - 99 mg/dL   Comment 1 Notify RN   Glucose, capillary     Status: Abnormal   Collection Time: 12/31/17  7:08 AM  Result  Value Ref Range   Glucose-Capillary 166 (H) 70 - 99 mg/dL    Blood Alcohol level:  Lab Results  Component Value Date   ETH <10 12/26/2017   ETH <10 62/37/6283    Metabolic Disorder Labs: Lab Results  Component Value Date   HGBA1C 5.2 12/30/2017   MPG 102.54 12/30/2017   MPG 134 01/03/2017   No results found for: PROLACTIN Lab Results  Component Value Date   CHOL 157 12/30/2017   TRIG 114 12/30/2017   HDL 44 12/30/2017   CHOLHDL 3.6 12/30/2017   VLDL 23 12/30/2017   LDLCALC 90 12/30/2017   LDLCALC 134 (H) 01/03/2017    Physical Findings: AIMS:  , ,  ,  ,    CIWA:    COWS:     Musculoskeletal: Strength & Muscle Tone:  within normal limits Gait & Station: normal Patient leans: N/A  Psychiatric Specialty Exam: Physical Exam  Nursing note and vitals reviewed. Psychiatric: Her speech is normal. Thought content normal. Her affect is blunt. She is actively hallucinating. Cognition and memory are normal. She expresses impulsivity. She exhibits a depressed mood.    Review of Systems  Neurological: Negative.   Psychiatric/Behavioral: Positive for depression and suicidal ideas.  All other systems reviewed and are negative.   Blood pressure 102/78, pulse 85, temperature 98 F (36.7 C), temperature source Oral, resp. rate 18, height 4' 11"  (1.499 m), weight 68.9 kg, SpO2 99 %.Body mass index is 30.7 kg/m.  General Appearance: Casual  Eye Contact:  Good  Speech:  Clear and Coherent  Volume:  Normal  Mood:  Depressed  Affect:  Blunt  Thought Process:  Goal Directed and Descriptions of Associations: Intact  Orientation:  Full (Time, Place, and Person)  Thought Content:  Hallucinations: Auditory  Suicidal Thoughts:  Yes.  without intent/plan  Homicidal Thoughts:  No  Memory:  Immediate;   Poor Recent;   Poor Remote;   Poor  Judgement:  Impaired  Insight:  Shallow  Psychomotor Activity:  Normal  Concentration:  Concentration: Fair and Attention Span: Poor  Recall:   Poor  Fund of Knowledge:  Poor  Language:  Fair  Akathisia:  No  Handed:  Right  AIMS (if indicated):     Assets:  Communication Skills Desire for Improvement Financial Resources/Insurance Housing Physical Health Resilience Social Support  ADL's:  Intact  Cognition:  WNL  Sleep:  Number of Hours: 7.75     Treatment Plan Summary: Daily contact with patient to assess and evaluate symptoms and progress in treatment and Medication management   Ms. Klink is a 52 year old female with a history of schizophrenia admitted for suicidal ideation with a [place].   #Suicidal ideation -patient is able to contract for safety in the hospital  #Psychosis -increase Latuda to 120 mg daily -Cymbalta 60 mg daily -Lamictal 100 mg BID -Trazodone 100 mg nightly  #Hypothyroid -Synthroid 150 ucg daily  #DM -Lantus 15 units BID -Metformin 500 mg BID -ADA diet, SSI -Neurontin 600 mg TID  #HTN -continue antihypertensives  #GERD -Protonix 40 mg daily  #Smoking cessation -nicotine patch is available  #Labs -lipid panel, TSH, A1C -EKG  #Disposition -discharge back to her group home -follow up with primary psychiatrist  Orson Slick, MD 12/31/2017, 9:23 AM

## 2017-12-31 NOTE — Progress Notes (Signed)
Recreation Therapy Notes  Date: 12/31/2017  Time: 9:30 am  Location: Craft Room  Behavioral response: Appropriate  Intervention Topic: Stress  Discussion/Intervention:  Group content on today was focused on stress. The group defined stress and ways to cope with stress. Participants expressed how they know when they are stresses out. Individuals described the different ways they have to cope with stress. The group stated reasons why it is important to cope with stress. Patient explained what good stress is and some examples. The group participated in the intervention "Stress Management Jeopardy". Individuals were separated into two group and answered questions related to stress.  Clinical Observations/Feedback:  Patient came to group and explained that when she is in big crowds she becomes stressed. She identified meditation,music, and isolation as ways she deals with stress. Individual participated in group and was social with peers and staff while participating in the intervention.  Clema Skousen LRT/CTRS          Richy Spradley 12/31/2017 12:08 PM
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
## 2017-12-31 NOTE — B[facility] Group Notes (Signed)
[facility] LCSW Group Therapy Note  Date/Time: 12/31/17, 1300  Type of Therapy/Topic:  Group Therapy:  Balance in Life  Participation Level:  active  Description of Group:    This group will address the concept of balance and how it feels and looks when one is unbalanced. Patients will be encouraged to process areas in their lives that are out of balance, and identify reasons for remaining unbalanced. Facilitators will guide patients utilizing problem- solving interventions to address and correct the stressor making their life unbalanced. Understanding and applying boundaries will be explored and addressed for obtaining  and maintaining a balanced life. Patients will be encouraged to explore ways to assertively make their unbalanced needs known to significant others in their lives, using other group members and facilitator for support and feedback.  Therapeutic Goals: 1. Patient will identify two or more emotions or situations they have that consume much of in their lives. 2. Patient will identify signs/triggers that life has become out of balance:  3. Patient will identify two ways to set boundaries in order to achieve balance in their lives:  4. Patient will demonstrate ability to communicate their needs through discussion and/or role plays  Summary of Patient Progress:PPt shared that family is currently out of balance in her life.  Pt left group at the beginning and another pt reported she was upset when the topic of Christmas came up.  Pt returned shortly and was very active in the discussion, raising her hand frequently and adding comments.          Therapeutic Modalities:   Cognitive Behavioral Therapy Solution-Focused Therapy Assertiveness Training  Lurline Idol, [place]

## 2017-12-31 NOTE — Progress Notes (Signed)
DAR Note: Pt A & O X3. Denies SI, HI, AVH and pain throughout this shift. Remains preoccupied and tearful about failures to reach her group home via multiple phone calls "I don't know why she's not picking up, it's a wrong number, can the police go there please". Cooperative with CBG monitoring. Compliant with medications when offered. Denies side effects.  Emotional support and encouragement offered to pt. All medications given per MD's orders, effects monitored.  Pt receptive to care. Tolerates all PO intake well. Attended groups and was engaged. Showered and changed scrubs. Remains safe on unit.

## 2017-12-31 NOTE — Progress Notes (Signed)
D - Patient was in her room upon arrival to the unit. Patient was pleasant during assessment and medication administration. Patient denies SI/HI/AVH, pain, anxiety and depression. Patient stated, "I am not hearing or seeing anything any more." Patients blood sugar this evening was 101, and didn't receive coverage. Patient was active on the milieu for snack and interacted appropriately with peers and staff on the unit. Patient stated to this writer that she was leaving on Monday. Patient stated that she wants to speak with her social worker tomorrow (12/31/2017).   A - Patient was compliant with medication administration per MD order and procedures on the unit. Patient given education. Patient given support and encouragement. Patient informed to let staff know if there are any problems or issues on the unit.   R - Patient being monitored Q 15 minutes per safety per unit protocol. Patient remains safe on the unit at this time.

## 2017-12-31 NOTE — [facility] Suicide Risk Assessment (Signed)
[place] INPATIENT:  Family/Significant Other Suicide Prevention Education  Suicide Prevention Education:  Education Completed; Jachelle Fluty, brother, [phone], has been identified by the patient as the family member/significant other with whom the patient will be residing, and identified as the person(s) who will aid the patient in the event of a mental health crisis (suicidal ideations/suicide attempt).  With written consent from the patient, the family member/significant other has been provided the following suicide prevention education, prior to the and/or following the discharge of the patient.  The suicide prevention education provided includes the following:  Suicide risk factors  Suicide prevention and interventions  National Suicide Hotline telephone number  [facility] assessment telephone number  [facility] Emergency Assistance [place] and/or Residential Mobile Crisis Unit telephone number  Request made of family/significant other to:  Remove weapons (e.g., guns, rifles, knives), all items previously/currently identified as safety concern.    Remove drugs/medications (over-the-counter, prescriptions, illicit drugs), all items previously/currently identified as a safety concern.  The family member/significant other verbalizes understanding of the suicide prevention education information provided.  The family member/significant other agrees to remove the items of safety concern listed above.  Joanne Chars, LCSW 12/31/2017, 3:31 PM

## 2017-12-31 NOTE — Progress Notes (Signed)
Recreation Therapy Notes  INPATIENT RECREATION THERAPY ASSESSMENT  Patient Details Name: Jessica [name] MRN: [id number] DOB: 03-Jul-1965 Today's Date: 12/31/2017       Information Obtained From: Patient  Able to Participate in Assessment/Interview: Yes  Patient Presentation: Responsive  Reason for Admission (Per Patient): Suicidal Ideation(Depression)  Patient Stressors:    Coping Skills:   Music  Leisure Interests (2+):  Art - Draw, Art - Coloring  Frequency of Recreation/Participation: Weekly  Awareness of Community Resources:     [employer]:     Current Use:    If no, Barriers?:    Expressed Interest in [place] of Residence:  Insurance underwriter  Patient Main Form of Transportation: Other (Comment)(My group home)  Patient Strengths:  I try to be happy   Patient Identified Areas of Improvement:  Try to redirect myself  Patient Goal for Hospitalization:  Conquer my illness and work on my temper  Current SI (including self-harm):  No  Current HI:  No  Current AVH: No  Staff Intervention Plan: Group Attendance, Collaborate with Interdisciplinary Treatment Team  Consent to Intern Participation: N/A  Adra Shepler 12/31/2017, 9:07 AM

## 2017-12-31 NOTE — Tx Team (Addendum)
Interdisciplinary Treatment and Diagnostic Plan Update  12/31/2017 Time of Session: [place] MRN: [id number]  Principal Diagnosis: Undifferentiated schizophrenia [facility])  Secondary Diagnoses: Principal Problem:   Undifferentiated schizophrenia ([place]) Active Problems:   Diabetes mellitus without complication ([place])   Mild intellectual disability   Current Medications:  Current Facility-Administered Medications  Medication Dose Route Frequency Provider Last Rate Last Dose  . acetaminophen (TYLENOL) tablet 650 mg  650 mg Oral Q6H PRN Pucilowska, Jolanta B, MD      . alum & mag hydroxide-simeth (MAALOX/MYLANTA) 200-200-20 MG/5ML suspension 30 mL  30 mL Oral Q4H PRN Pucilowska, Jolanta B, MD      . carvedilol (COREG) tablet 6.25 mg  6.25 mg Oral BID WC Pucilowska, Jolanta B, MD   6.25 mg at 12/31/17 0819  . docusate sodium (COLACE) capsule 100 mg  100 mg Oral BID Pucilowska, Jolanta B, MD   100 mg at 12/31/17 0819  . DULoxetine (CYMBALTA) DR capsule 60 mg  60 mg Oral Daily Pucilowska, Jolanta B, MD   60 mg at 12/31/17 0819  . gabapentin (NEURONTIN) capsule 600 mg  600 mg Oral TID Pucilowska, Jolanta B, MD   600 mg at 12/31/17 0819  . hydrOXYzine (ATARAX/VISTARIL) tablet 25 mg  25 mg Oral TID PRN Pucilowska, Jolanta B, MD      . insulin aspart (novoLOG) injection 0-15 Units  0-15 Units Subcutaneous TID WC Pucilowska, Jolanta B, MD   3 Units at 12/31/17 0829  . insulin aspart (novoLOG) injection 0-5 Units  0-5 Units Subcutaneous QHS Pucilowska, Jolanta B, MD      . insulin detemir (LEVEMIR) injection 15 Units  15 Units Subcutaneous BID Pucilowska, Jolanta B, MD   15 Units at 12/30/17 2137  . lamoTRIgine (LAMICTAL) tablet 100 mg  100 mg Oral BID Pucilowska, Jolanta B, MD   100 mg at 12/31/17 0819  . levothyroxine (SYNTHROID, LEVOTHROID) tablet 150 mcg  150 mcg Oral Q0600 Pucilowska, Jolanta B, MD   150 mcg at 12/31/17 0618  . lisinopril (PRINIVIL,ZESTRIL) tablet 10 mg  10 mg Oral Daily  Pucilowska, Jolanta B, MD   10 mg at 12/31/17 6314  . lurasidone (LATUDA) tablet 120 mg  120 mg Oral Q breakfast Pucilowska, Jolanta B, MD   120 mg at 12/31/17 0823  . magnesium hydroxide (MILK OF MAGNESIA) suspension 30 mL  30 mL Oral Daily PRN Pucilowska, Jolanta B, MD      . metFORMIN (GLUCOPHAGE) tablet 500 mg  500 mg Oral BID WC Pucilowska, Jolanta B, MD   500 mg at 12/31/17 0819  . nicotine (NICODERM CQ - dosed in mg/24 hours) patch 21 mg  21 mg Transdermal Q0600 Pucilowska, Jolanta B, MD      . pantoprazole (PROTONIX) EC tablet 40 mg  40 mg Oral Daily Pucilowska, Jolanta B, MD   40 mg at 12/31/17 0819  . traZODone (DESYREL) tablet 100 mg  100 mg Oral QHS PRN Pucilowska, Jolanta B, MD   100 mg at 12/30/17 2137   PTA Medications: Medications Prior to Admission  Medication Sig Dispense Refill Last Dose  . acetaminophen (TYLENOL) 325 MG tablet Take 2 tablets (650 mg total) by mouth every 6 (six) hours as needed for mild pain or headache.   PRN at PRN  . benztropine (COGENTIN) 1 MG tablet Take 1 tablet (1 mg total) by mouth 2 (two) times daily. For prevention of drug induced tremors (Patient not taking: Reported on 07/09/2017) 60 tablet 0 Not Taking at Unknown time  .  carvedilol (COREG) 6.25 MG tablet Take 1 tablet (6.25 mg total) by mouth 2 (two) times daily. For high blood pressure 15 tablet 0 07/09/2017 at Unknown time  . DULoxetine (CYMBALTA) 60 MG capsule Take 1 capsule (60 mg total) by mouth daily. For depression 30 capsule 0 07/09/2017 at Unknown time  . ferrous sulfate (FEROSUL) 325 (65 FE) MG tablet Take 1 tablet (325 mg total) by mouth 2 (two) times daily. For anemia 30 tablet 0 07/09/2017 at Unknown time  . gabapentin (NEURONTIN) 600 MG tablet Take 1 tablet (600 mg total) by mouth 3 (three) times daily. For agitation (Patient not taking: Reported on 07/09/2017) 90 tablet 0 Not Taking at Unknown time  . hydrOXYzine (ATARAX/VISTARIL) 25 MG tablet Take 1 tablet (25 mg total) by mouth 3 (three)  times daily as needed for anxiety. (Patient not taking: Reported on 07/09/2017) 60 tablet 0 Not Taking at Unknown time  . lamoTRIgine (LAMICTAL) 100 MG tablet Take 1 tablet (100 mg total) by mouth at bedtime. For mood stabilization 30 tablet 0 07/08/2017 at Unknown time  . LEVEMIR FLEXTOUCH 100 UNIT/ML Pen INJECT 30 UNITS UNDER SKIN EVERY TWELVE HOURS: For diabetes managment (Patient taking differently: Inject 15 Units into the skin 2 (two) times daily. ) 15 mL 0 07/09/2017 at Unknown time  . levothyroxine (SYNTHROID, LEVOTHROID) 150 MCG tablet Take 1 tablet (150 mcg total) by mouth daily. For thyroid function hormone replacement 15 tablet 0 07/09/2017 at Unknown time  . lisinopril (PRINIVIL,ZESTRIL) 10 MG tablet Take 1 tablet (10 mg total) by mouth daily. For high blood pressure 15 tablet 0 07/09/2017 at Unknown time  . loratadine (CLARITIN) 10 MG tablet Take 1 tablet (10 mg total) by mouth daily. For allergies 15 tablet 0 07/09/2017 at Unknown time  . lurasidone (LATUDA) 80 MG TABS tablet Take 1 tablet (80 mg total) by mouth 2 (two) times daily after a meal. For mood control (Patient taking differently: Take 80 mg by mouth at bedtime. ) 60 tablet 0 07/08/2017 at Unknown time  . metFORMIN (GLUCOPHAGE) 500 MG tablet Take 1 tablet (500 mg total) by mouth 2 (two) times daily. For diabetes management 30 tablet 0 07/09/2017 at Unknown time  . naproxen (NAPROSYN) 500 MG tablet Take 1 tablet (500 mg total) by mouth 2 (two) times daily with a meal. 20 tablet 2   . OS-CAL CALCIUM + D3 500-200 MG-UNIT TABS Take 1 tablet by mouth 2 (two) times daily. (Patient not taking: Reported on 07/09/2017) 30 tablet 0 Not Taking at Unknown time  . pantoprazole (PROTONIX) 40 MG tablet Take 1 tablet (40 mg total) by mouth daily. For acid reflux 15 tablet 0 07/09/2017 at Unknown time  . RESTASIS 0.05 % ophthalmic emulsion INSTILL ONE DROP IN AFFECTED EYE TWICE DAILY AS DIRECTED: eye irritation (Patient not taking: Reported on 07/09/2017)  0.4 mL 0 Not Taking at Unknown time  . traZODone (DESYREL) 100 MG tablet Take 1 tablet (100 mg total) by mouth at bedtime as needed for sleep. 30 tablet 0 07/08/2017 at Unknown time  . valACYclovir (VALTREX) 500 MG tablet Take 1 tablet (500 mg total) by mouth daily. For herpes simplex infection (Patient not taking: Reported on 07/09/2017) 10 tablet 0 Not Taking at Unknown time    Patient Stressors: Medication change or noncompliance Other: auditory hallucinations, suicidal thoughts  Patient Strengths: Ability for insight Active sense of humor Communication skills Motivation for treatment/growth Supportive family/friends  Treatment Modalities: Medication Management, Group therapy, Case management,  1 to  1 session with clinician, Psychoeducation, Recreational therapy.   Physician Treatment Plan for Primary Diagnosis: Undifferentiated schizophrenia ([place]) Long Term Goal(s): Improvement in symptoms so as ready for discharge Improvement in symptoms so as ready for discharge   Short Term Goals: Ability to identify changes in lifestyle to reduce recurrence of condition will improve Ability to verbalize feelings will improve Ability to disclose and discuss suicidal ideas Ability to demonstrate self-control will improve Ability to identify and develop effective coping behaviors will improve Ability to maintain clinical measurements within normal limits will improve Ability to identify triggers associated with substance abuse/mental health issues will improve NA  Medication Management: Evaluate patient's response, side effects, and tolerance of medication regimen.  Therapeutic Interventions: 1 to 1 sessions, Unit Group sessions and Medication administration.  Evaluation of Outcomes: Progressing  Physician Treatment Plan for Secondary Diagnosis: Principal Problem:   Undifferentiated schizophrenia ([place]) Active Problems:   Diabetes mellitus without complication ([place])   Mild intellectual  disability  Long Term Goal(s): Improvement in symptoms so as ready for discharge Improvement in symptoms so as ready for discharge   Short Term Goals: Ability to identify changes in lifestyle to reduce recurrence of condition will improve Ability to verbalize feelings will improve Ability to disclose and discuss suicidal ideas Ability to demonstrate self-control will improve Ability to identify and develop effective coping behaviors will improve Ability to maintain clinical measurements within normal limits will improve Ability to identify triggers associated with substance abuse/mental health issues will improve NA     Medication Management: Evaluate patient's response, side effects, and tolerance of medication regimen.  Therapeutic Interventions: 1 to 1 sessions, Unit Group sessions and Medication administration.  Evaluation of Outcomes: Progressing   RN Treatment Plan for Primary Diagnosis: Undifferentiated schizophrenia ([place]) Long Term Goal(s): Knowledge of disease and therapeutic regimen to maintain health will improve  Short Term Goals: Ability to identify and develop effective coping behaviors will improve and Compliance with prescribed medications will improve  Medication Management: RN will administer medications as ordered by provider, will assess and evaluate patient's response and provide education to patient for prescribed medication. RN will report any adverse and/or side effects to prescribing provider.  Therapeutic Interventions: 1 on 1 counseling sessions, Psychoeducation, Medication administration, Evaluate responses to treatment, Monitor vital signs and CBGs as ordered, Perform/monitor CIWA, COWS, AIMS and Fall Risk screenings as ordered, Perform wound care treatments as ordered.  Evaluation of Outcomes: Progressing   LCSW Treatment Plan for Primary Diagnosis: Undifferentiated schizophrenia ([place]) Long Term Goal(s): Safe transition to appropriate next level of care  at discharge, Engage patient in therapeutic group addressing interpersonal concerns.  Short Term Goals: Engage patient in aftercare planning with referrals and resources, Increase social support and Increase skills for wellness and recovery  Therapeutic Interventions: Assess for all discharge needs, 1 to 1 time with Social worker, Explore available resources and support systems, Assess for adequacy in community support network, Educate family and significant other(s) on suicide prevention, Complete Psychosocial Assessment, Interpersonal group therapy.  Evaluation of Outcomes: Progressing   Progress in Treatment: Attending groups: Yes. Participating in groups: Yes. Taking medication as prescribed: Yes. Toleration medication: Yes. Family/Significant other contact made: No, will contact:  brother Patient understands diagnosis: Yes. Discussing patient identified problems/goals with staff: Yes. Medical problems stabilized or resolved: Yes. Denies suicidal/homicidal ideation: Yes. Issues/concerns per patient self-inventory: No. Other: none  New problem(s) identified: No, Describe:  none  New Short Term/Long Term Goal(s):  Patient Goals:  "get better"  Discharge Plan or  Barriers:   Reason for Continuation of Hospitalization: Depression Hallucinations Medication stabilization  Estimated Length of Stay: 2-4 days  Recreational Therapy: Patient Stressors: N/A Patient Goal: Patient will identify 3 triggers for depression within 5 recreation therapy group sessions  Attendees: Patient:Jessica [name] 12/31/2017   Physician: Dr Audie Box, MD 12/31/2017   Nursing: Nuala Alpha, RN 12/31/2017   RN Care Manager: 12/31/2017   Social Worker: Lurline Idol, LCSW 12/31/2017   Recreational Therapist: Roanna Epley, LRT,CTRS 12/31/2017   Other: Marney Doctor, chaplain 12/31/2017   Other:  12/31/2017   Other: 12/31/2017        Scribe for Treatment Team: Joanne Chars,  LCSW 12/31/2017 11:35 AM

## 2018-01-01 LAB — GLUCOSE, CAPILLARY
Glucose-Capillary: 169 mg/dL — ABNORMAL HIGH (ref 70–99)
Glucose-Capillary: 107 mg/dL — ABNORMAL HIGH (ref 70–99)
Glucose-Capillary: 172 mg/dL — ABNORMAL HIGH (ref 70–99)
Glucose-Capillary: 119 mg/dL — ABNORMAL HIGH (ref 70–99)

## 2018-01-01 NOTE — Progress Notes (Signed)
[facility] MD Progress Note  01/01/2018 2:57 PM Jessica [name]  MRN:  [id number] Subjective: Follow-up for this patient with schizophrenia.  She has no complaints today.  She says she is feeling much better.  Mood feels fine.  Denies suicidal thoughts.  She is not aggressive or dangerous and she is taking much better care of her hygiene.  Interacts with peers and staff appropriately.  Shows better insight and agrees to outpatient treatment. Principal Problem: Undifferentiated schizophrenia ([place]) Diagnosis: Principal Problem:   Undifferentiated schizophrenia ([place]) Active Problems:   Diabetes mellitus without complication ([place])   Mild intellectual disability  Total Time spent with patient: 30 minutes  Past Psychiatric History: History of schizoaffective disorder and substance abuse  Past Medical History:  Past Medical History:  Diagnosis Date  . Anxiety   . Depression   . Diabetes mellitus without complication ([place])   . GERD (gastroesophageal reflux disease)   . Hypertension    History reviewed. No pertinent surgical history. Family History: History reviewed. No pertinent family history. Family Psychiatric  History: See previous note Social History:  Social History   Substance and Sexual Activity  Alcohol Use No  . Frequency: Never     Social History   Substance and Sexual Activity  Drug Use Yes  . Types: Marijuana, "Crack" cocaine   Comment: reports she has not done anything in a long time    Social History   Socioeconomic History  . Marital status: Single    Spouse name: Not on file  . Number of children: Not on file  . Years of education: Not on file  . Highest education level: Not on file  Occupational History  . Not on file  Social Needs  . Financial resource strain: Not on file  . Food insecurity:    Worry: Not on file    Inability: Not on file  . Transportation needs:    Medical: Not on file    Non-medical: Not on file  Tobacco Use  . Smoking status: Former  Research scientist (life sciences)  . Smokeless tobacco: Never Used  Substance and Sexual Activity  . Alcohol use: No    Frequency: Never  . Drug use: Yes    Types: Marijuana, "Crack" cocaine    Comment: reports she has not done anything in a long time  . Sexual activity: Not on file  Lifestyle  . Physical activity:    Days per week: Not on file    Minutes per session: Not on file  . Stress: Not on file  Relationships  . Social connections:    Talks on phone: Not on file    Gets together: Not on file    Attends religious service: Not on file    Active member of club or organization: Not on file    Attends meetings of clubs or organizations: Not on file    Relationship status: Not on file  Other Topics Concern  . Not on file  Social History Narrative  . Not on file   Additional Social History:                         Sleep: Fair  Appetite:  Fair  Current Medications: Current Facility-Administered Medications  Medication Dose Route Frequency Provider Last Rate Last Dose  . acetaminophen (TYLENOL) tablet 650 mg  650 mg Oral Q6H PRN Pucilowska, Jolanta B, MD      . alum & mag hydroxide-simeth (MAALOX/MYLANTA) 200-200-20 MG/5ML suspension 30 mL  30 mL Oral Q4H PRN Pucilowska, Jolanta B, MD   30 mL at 12/31/17 2134  . carvedilol (COREG) tablet 6.25 mg  6.25 mg Oral BID WC Pucilowska, Jolanta B, MD   6.25 mg at 01/01/18 0806  . docusate sodium (COLACE) capsule 100 mg  100 mg Oral BID Pucilowska, Jolanta B, MD   100 mg at 01/01/18 0807  . DULoxetine (CYMBALTA) DR capsule 60 mg  60 mg Oral Daily Pucilowska, Jolanta B, MD   60 mg at 01/01/18 0806  . gabapentin (NEURONTIN) capsule 600 mg  600 mg Oral TID Pucilowska, Jolanta B, MD   600 mg at 01/01/18 1215  . hydrOXYzine (ATARAX/VISTARIL) tablet 25 mg  25 mg Oral TID PRN Pucilowska, Jolanta B, MD      . insulin aspart (novoLOG) injection 0-15 Units  0-15 Units Subcutaneous TID WC Pucilowska, Jolanta B, MD   3 Units at 01/01/18 0807  . insulin aspart  (novoLOG) injection 0-5 Units  0-5 Units Subcutaneous QHS Pucilowska, Jolanta B, MD      . insulin detemir (LEVEMIR) injection 15 Units  15 Units Subcutaneous BID Pucilowska, Jolanta B, MD   15 Units at 01/01/18 1015  . lamoTRIgine (LAMICTAL) tablet 100 mg  100 mg Oral BID Pucilowska, Jolanta B, MD   100 mg at 01/01/18 0806  . levothyroxine (SYNTHROID, LEVOTHROID) tablet 150 mcg  150 mcg Oral Q0600 Pucilowska, Jolanta B, MD   150 mcg at 01/01/18 0554  . lisinopril (PRINIVIL,ZESTRIL) tablet 10 mg  10 mg Oral Daily Pucilowska, Jolanta B, MD   Stopped at 01/01/18 0807  . lurasidone (LATUDA) tablet 120 mg  120 mg Oral Q breakfast Pucilowska, Jolanta B, MD   120 mg at 01/01/18 0806  . magnesium hydroxide (MILK OF MAGNESIA) suspension 30 mL  30 mL Oral Daily PRN Pucilowska, Jolanta B, MD      . metFORMIN (GLUCOPHAGE) tablet 500 mg  500 mg Oral BID WC Pucilowska, Jolanta B, MD   500 mg at 01/01/18 0806  . nicotine (NICODERM CQ - dosed in mg/24 hours) patch 21 mg  21 mg Transdermal Q0600 Pucilowska, Jolanta B, MD   21 mg at 01/01/18 0554  . pantoprazole (PROTONIX) EC tablet 40 mg  40 mg Oral Daily Pucilowska, Jolanta B, MD   40 mg at 01/01/18 0807  . traZODone (DESYREL) tablet 100 mg  100 mg Oral QHS PRN Pucilowska, Jolanta B, MD   100 mg at 12/31/17 2134    Lab Results:  Results for orders placed or performed during the hospital encounter of 12/29/17 (from the past 48 hour(s))  Glucose, capillary     Status: Abnormal   Collection Time: 12/30/17  4:24 PM  Result Value Ref Range   Glucose-Capillary 116 (H) 70 - 99 mg/dL  Glucose, capillary     Status: Abnormal   Collection Time: 12/30/17  8:28 PM  Result Value Ref Range   Glucose-Capillary 101 (H) 70 - 99 mg/dL   Comment 1 Notify RN   Glucose, capillary     Status: Abnormal   Collection Time: 12/31/17  7:08 AM  Result Value Ref Range   Glucose-Capillary 166 (H) 70 - 99 mg/dL  Glucose, capillary     Status: Abnormal   Collection Time: 12/31/17  11:14 AM  Result Value Ref Range   Glucose-Capillary 123 (H) 70 - 99 mg/dL  Glucose, capillary     Status: None   Collection Time: 12/31/17  4:13 PM  Result Value Ref Range   Glucose-Capillary  99 70 - 99 mg/dL  Glucose, capillary     Status: Abnormal   Collection Time: 12/31/17  8:08 PM  Result Value Ref Range   Glucose-Capillary 151 (H) 70 - 99 mg/dL   Comment 1 Notify RN   Glucose, capillary     Status: Abnormal   Collection Time: 12/31/17  9:31 PM  Result Value Ref Range   Glucose-Capillary 154 (H) 70 - 99 mg/dL  Glucose, capillary     Status: Abnormal   Collection Time: 01/01/18  7:04 AM  Result Value Ref Range   Glucose-Capillary 169 (H) 70 - 99 mg/dL   Comment 1 Notify RN   Glucose, capillary     Status: Abnormal   Collection Time: 01/01/18 11:21 AM  Result Value Ref Range   Glucose-Capillary 107 (H) 70 - 99 mg/dL    Blood Alcohol level:  Lab Results  Component Value Date   ETH <10 12/26/2017   ETH <10 27/25/3664    Metabolic Disorder Labs: Lab Results  Component Value Date   HGBA1C 5.2 12/30/2017   MPG 102.54 12/30/2017   MPG 134 01/03/2017   No results found for: PROLACTIN Lab Results  Component Value Date   CHOL 157 12/30/2017   TRIG 114 12/30/2017   HDL 44 12/30/2017   CHOLHDL 3.6 12/30/2017   VLDL 23 12/30/2017   LDLCALC 90 12/30/2017   LDLCALC 134 (H) 01/03/2017    Physical Findings: AIMS:  , ,  ,  ,    CIWA:    COWS:     Musculoskeletal: Strength & Muscle Tone: within normal limits Gait & Station: normal Patient leans: N/A  Psychiatric Specialty Exam: Physical Exam  Nursing note and vitals reviewed. Constitutional: She appears well-developed and well-nourished.  HENT:  Head: Normocephalic and atraumatic.  Eyes: Pupils are equal, round, and reactive to light. Conjunctivae are normal.  Neck: Normal range of motion.  Cardiovascular: Regular rhythm and normal heart sounds.  Respiratory: Effort normal. No respiratory distress.  GI:  Soft.  Musculoskeletal: Normal range of motion.  Neurological: She is alert.  Skin: Skin is warm and dry.  Psychiatric: Judgment normal. Her affect is blunt. Her speech is delayed. She is slowed. Cognition and memory are impaired. She expresses no suicidal ideation.    Review of Systems  Constitutional: Negative.   HENT: Negative.   Eyes: Negative.   Respiratory: Negative.   Cardiovascular: Negative.   Gastrointestinal: Negative.   Musculoskeletal: Negative.   Skin: Negative.   Neurological: Negative.   Psychiatric/Behavioral: Negative.     Blood pressure 92/69, pulse 96, temperature 99.3 F (37.4 C), temperature source Oral, resp. rate 18, height 4\' 11"  (1.499 m), weight 68.9 kg, SpO2 97 %.Body mass index is 30.7 kg/m.  General Appearance: Fairly Groomed  Eye Contact:  Good  Speech:  Slow  Volume:  Decreased  Mood:  Euthymic  Affect:  Constricted  Thought Process:  Coherent  Orientation:  Full (Time, Place, and Person)  Thought Content:  Logical  Suicidal Thoughts:  No  Homicidal Thoughts:  No  Memory:  Immediate;   Fair Recent;   Fair Remote;   Fair  Judgement:  Fair  Insight:  Fair  Psychomotor Activity:  Decreased  Concentration:  Concentration: Fair  Recall:  [employer] of Knowledge:  Fair  Language:  Fair  Akathisia:  No  Handed:  Right  AIMS (if indicated):     Assets:  Desire for Improvement Housing Physical Health Resilience  ADL's:  Intact  Cognition:  WNL  Sleep:  Number of Hours: 7.3     Treatment Plan Summary: Daily contact with patient to assess and evaluate symptoms and progress in treatment, Medication management and Plan Patient will continue on current medicine.  She tells me that she is likely to be going home in the next day which seems like a good option for her.  She has clearly improved quite a bit since being in the hospital.  No change to medicine for today.  Supportive counseling and therapy.  Alethia Berthold, MD 01/01/2018, 2:57 PM

## 2018-01-01 NOTE — [facility] Group Notes (Signed)
LCSW Group Therapy Note  01/01/2018 1:15pm  Type of Therapy and Topic:  Group Therapy:  Healthy Self Image and Positive Change  Participation Level:  Did Not Attend   Description of Group:  In this group, patients will compare and contrast their current "I am...." statements to the visions they identify as desirable for their lives.  Patients discuss fears and how they can make positive changes in their cognitions that will positively impact their behaviors.  Facilitator played a motivational 3-minute speech and patients were left with the task of thinking about what "I am...." statements they can start using in their lives immediately.  Therapeutic Goals: 1. Patient will state their current self-perception as expressed in an "I Am" statement 2. Patient will contrast this with their desired vision for their live 3. Patient will identify 3 fears that negatively impact their behavior 4. Patient will discuss cognitive distortions that stem from their fears 5. Patient will verbalize statements that challenge their cognitive distortions  Summary of Patient Progress:  Pt was invited to attend group but chose not to attend. CSW will continue to encourage pt to attend group throughout their admission.    Therapeutic Modalities Cognitive Behavioral Therapy Motivational Interviewing  Tina Gruner  CUEBAS-COLON, LCSW 01/01/2018 4:08 PM

## 2018-01-01 NOTE — Plan of Care (Signed)
Patient is appropriate in the unit and maintaining safety and demonstrating self control,  mood is bright and affect is congruent with mood, association is intact , thinking is logical and thought  content is appropriate  patient attends groups with good participation and assertive, denies any SI.HI .AVH. no bizarre behaviors , tolerating her medications without any side effects , denies any anxiety/depression at this time, appetite is good and well hydrated with fluids and juices, positive support and encouragement is provided ,  no distress and 15 minute safety rounding is in progress.    Problem: Education: Goal: Will be free of psychotic symptoms Outcome: Progressing Goal: Knowledge of the prescribed therapeutic regimen will improve Outcome: Progressing   Problem: Coping: Goal: Coping ability will improve Outcome: Progressing Goal: Will verbalize feelings Outcome: Progressing   Problem: Health Behavior/Discharge Planning: Goal: Compliance with prescribed medication regimen will improve Outcome: Progressing   Problem: Self-Concept: Goal: Ability to disclose and discuss suicidal ideas will improve Outcome: Progressing   Problem: Safety: Goal: Periods of time without injury will increase Outcome: Progressing   Problem: Coping: Goal: Ability to identify and develop effective coping behavior will improve Outcome: Progressing   Problem: Safety: Goal: Ability to remain free from injury will improve Outcome: Progressing

## 2018-01-01 NOTE — Plan of Care (Signed)
Patient is cooperating with and maintaining safety in the unit, socialization with peers is good, mood is appropriate and affect is good, patient contract for safety of self and others, appetite is good , voice no concerns, endorses depression and anxiety at 3/10 teaching provided and positive social support and coping skill is provided, patient acknowledge information given , denies any SI,HI, AVH, no acute distress noted.   Problem: Education: Goal: Will be free of psychotic symptoms Outcome: Progressing Goal: Knowledge of the prescribed therapeutic regimen will improve Outcome: Progressing   Problem: Coping: Goal: Coping ability will improve Outcome: Progressing Goal: Will verbalize feelings Outcome: Progressing   Problem: Health Behavior/Discharge Planning: Goal: Compliance with prescribed medication regimen will improve Outcome: Progressing   Problem: Self-Concept: Goal: Ability to disclose and discuss suicidal ideas will improve Outcome: Progressing   Problem: Safety: Goal: Periods of time without injury will increase Outcome: Progressing   Problem: Coping: Goal: Ability to identify and develop effective coping behavior will improve Outcome: Progressing   Problem: Safety: Goal: Ability to remain free from injury will improve Outcome: Progressing

## 2018-01-01 NOTE — Plan of Care (Signed)
Patient is alert and oriented, X 3, denies SI, HI and AVH. Patient is very pleasant and denies pain. Patient very animated, and hypervigilant. Patient very anxious about possible discharge on Monday. Patient states she talked with her brother this morning and her brother has spoken with the group home owner. According to patient the group home owner stated she is welcomed to go back to the group home. Patient states her brother will pick her up on Monday. Problem: Education: Goal: Will be free of psychotic symptoms Outcome: Progressing Goal: Knowledge of the prescribed therapeutic regimen will improve Outcome: Progressing   Problem: Coping: Goal: Coping ability will improve Outcome: Progressing Goal: Will verbalize feelings Outcome: Progressing   Problem: Health Behavior/Discharge Planning: Goal: Compliance with prescribed medication regimen will improve Outcome: Progressing

## 2018-01-02 LAB — GLUCOSE, CAPILLARY
GLUCOSE-CAPILLARY: 214 mg/dL — AB (ref 70–99)
Glucose-Capillary: 178 mg/dL — ABNORMAL HIGH (ref 70–99)

## 2018-01-02 MED ORDER — INSULIN DETEMIR 100 UNIT/ML ~~LOC~~ SOLN: 15.0000 [IU] | Freq: Two times a day (BID) | SUBCUTANEOUS | 11 refills | Status: DC

## 2018-01-02 MED ORDER — LURASIDONE HCL 120 MG PO TABS: 120.0000 mg | ORAL_TABLET | Freq: Every day | ORAL | 1 refills | Status: DC

## 2018-01-02 MED ORDER — LAMOTRIGINE 100 MG PO TABS: 100.0000 mg | ORAL_TABLET | Freq: Two times a day (BID) | ORAL | 1 refills | Status: DC

## 2018-01-02 NOTE — [facility] Group Notes (Signed)
LCSW Group Therapy Note 01/02/2018 1:15pm  Type of Therapy and Topic: Group Therapy: Feelings Around Returning Home & Establishing a Supportive Framework and Supporting Oneself When Supports Not Available  Participation Level: Did Not Attend  Description of Group:  Patients first processed thoughts and feelings about upcoming discharge. These included fears of upcoming changes, lack of change, new living environments, judgements and expectations from others and overall stigma of mental health issues. The group then discussed the definition of a supportive framework, what that looks and feels like, and how do to discern it from an unhealthy non-supportive network. The group identified different types of supports as well as what to do when your family/friends are less than helpful or unavailable  Therapeutic Goals  1. Patient will identify one healthy supportive network that they can use at discharge. 2. Patient will identify one factor of a supportive framework and how to tell it from an unhealthy network. 3. Patient able to identify one coping skill to use when they do not have positive supports from others. 4. Patient will demonstrate ability to communicate their needs through discussion and/or role plays.  Summary of Patient Progress:  Pt was invited to attend group but chose not to attend. CSW will continue to encourage pt to attend group throughout their admission.   Therapeutic Modalities Cognitive Behavioral Therapy Motivational Interviewing   Carin Shipp  CUEBAS-COLON, LCSW 01/02/2018 12:17 PM

## 2018-01-02 NOTE — Progress Notes (Signed)
[facility] MD Progress Note  01/02/2018 12:56 PM Jessica [name]  MRN:  [id number] Subjective: Patient with schizophrenia continues to report that she is feeling better.  Denies hallucinations or delusions.  Denies suicidal or homicidal ideation.  Patient is taking care of her hygiene very well.  Interacts well with others.  Blunted affect but mood is stated as good.  Looking forward to discharge plan. Principal Problem: Undifferentiated schizophrenia ([place]) Diagnosis: Principal Problem:   Undifferentiated schizophrenia ([place]) Active Problems:   Diabetes mellitus without complication ([place])   Mild intellectual disability  Total Time spent with patient: 15 minutes  Past Psychiatric History: History of schizophrenia  Past Medical History:  Past Medical History:  Diagnosis Date  . Anxiety   . Depression   . Diabetes mellitus without complication ([place])   . GERD (gastroesophageal reflux disease)   . Hypertension    History reviewed. No pertinent surgical history. Family History: History reviewed. No pertinent family history. Family Psychiatric  History: See previous note Social History:  Social History   Substance and Sexual Activity  Alcohol Use No  . Frequency: Never     Social History   Substance and Sexual Activity  Drug Use Yes  . Types: Marijuana, "Crack" cocaine   Comment: reports she has not done anything in a long time    Social History   Socioeconomic History  . Marital status: Single    Spouse name: Not on file  . Number of children: Not on file  . Years of education: Not on file  . Highest education level: Not on file  Occupational History  . Not on file  Social Needs  . Financial resource strain: Not on file  . Food insecurity:    Worry: Not on file    Inability: Not on file  . Transportation needs:    Medical: Not on file    Non-medical: Not on file  Tobacco Use  . Smoking status: Former Research scientist (life sciences)  . Smokeless tobacco: Never Used  Substance and Sexual Activity   . Alcohol use: No    Frequency: Never  . Drug use: Yes    Types: Marijuana, "Crack" cocaine    Comment: reports she has not done anything in a long time  . Sexual activity: Not on file  Lifestyle  . Physical activity:    Days per week: Not on file    Minutes per session: Not on file  . Stress: Not on file  Relationships  . Social connections:    Talks on phone: Not on file    Gets together: Not on file    Attends religious service: Not on file    Active member of club or organization: Not on file    Attends meetings of clubs or organizations: Not on file    Relationship status: Not on file  Other Topics Concern  . Not on file  Social History Narrative  . Not on file   Additional Social History:                         Sleep: Fair  Appetite:  Fair  Current Medications: Current Facility-Administered Medications  Medication Dose Route Frequency Provider Last Rate Last Dose  . acetaminophen (TYLENOL) tablet 650 mg  650 mg Oral Q6H PRN Pucilowska, Jolanta B, MD   650 mg at 01/02/18 0228  . alum & mag hydroxide-simeth (MAALOX/MYLANTA) 200-200-20 MG/5ML suspension 30 mL  30 mL Oral Q4H PRN Pucilowska, Wardell Honour, MD  30 mL at 12/31/17 2134  . carvedilol (COREG) tablet 6.25 mg  6.25 mg Oral BID WC Pucilowska, Jolanta B, MD   6.25 mg at 01/02/18 0800  . docusate sodium (COLACE) capsule 100 mg  100 mg Oral BID Pucilowska, Jolanta B, MD   100 mg at 01/02/18 0800  . DULoxetine (CYMBALTA) DR capsule 60 mg  60 mg Oral Daily Pucilowska, Jolanta B, MD   60 mg at 01/02/18 0800  . gabapentin (NEURONTIN) capsule 600 mg  600 mg Oral TID Pucilowska, Jolanta B, MD   600 mg at 01/02/18 1205  . hydrOXYzine (ATARAX/VISTARIL) tablet 25 mg  25 mg Oral TID PRN Pucilowska, Jolanta B, MD   25 mg at 01/01/18 2140  . insulin aspart (novoLOG) injection 0-15 Units  0-15 Units Subcutaneous TID WC Pucilowska, Jolanta B, MD   3 Units at 01/02/18 1206  . insulin aspart (novoLOG) injection 0-5 Units   0-5 Units Subcutaneous QHS Pucilowska, Jolanta B, MD      . insulin detemir (LEVEMIR) injection 15 Units  15 Units Subcutaneous BID Pucilowska, Jolanta B, MD   15 Units at 01/02/18 1002  . lamoTRIgine (LAMICTAL) tablet 100 mg  100 mg Oral BID Pucilowska, Jolanta B, MD   100 mg at 01/02/18 0800  . levothyroxine (SYNTHROID, LEVOTHROID) tablet 150 mcg  150 mcg Oral Q0600 Pucilowska, Jolanta B, MD   150 mcg at 01/02/18 0642  . lisinopril (PRINIVIL,ZESTRIL) tablet 10 mg  10 mg Oral Daily Pucilowska, Jolanta B, MD   10 mg at 01/02/18 0800  . lurasidone (LATUDA) tablet 120 mg  120 mg Oral Q breakfast Pucilowska, Jolanta B, MD   120 mg at 01/02/18 0800  . magnesium hydroxide (MILK OF MAGNESIA) suspension 30 mL  30 mL Oral Daily PRN Pucilowska, Jolanta B, MD      . metFORMIN (GLUCOPHAGE) tablet 500 mg  500 mg Oral BID WC Pucilowska, Jolanta B, MD   500 mg at 01/02/18 0800  . nicotine (NICODERM CQ - dosed in mg/24 hours) patch 21 mg  21 mg Transdermal Q0600 Pucilowska, Jolanta B, MD   21 mg at 01/01/18 0554  . pantoprazole (PROTONIX) EC tablet 40 mg  40 mg Oral Daily Pucilowska, Jolanta B, MD   40 mg at 01/02/18 0800  . traZODone (DESYREL) tablet 100 mg  100 mg Oral QHS PRN Pucilowska, Jolanta B, MD   100 mg at 01/01/18 2140    Lab Results:  Results for orders placed or performed during the hospital encounter of 12/29/17 (from the past 48 hour(s))  Glucose, capillary     Status: None   Collection Time: 12/31/17  4:13 PM  Result Value Ref Range   Glucose-Capillary 99 70 - 99 mg/dL  Glucose, capillary     Status: Abnormal   Collection Time: 12/31/17  8:08 PM  Result Value Ref Range   Glucose-Capillary 151 (H) 70 - 99 mg/dL   Comment 1 Notify RN   Glucose, capillary     Status: Abnormal   Collection Time: 12/31/17  9:31 PM  Result Value Ref Range   Glucose-Capillary 154 (H) 70 - 99 mg/dL  Glucose, capillary     Status: Abnormal   Collection Time: 01/01/18  7:04 AM  Result Value Ref Range    Glucose-Capillary 169 (H) 70 - 99 mg/dL   Comment 1 Notify RN   Glucose, capillary     Status: Abnormal   Collection Time: 01/01/18 11:21 AM  Result Value Ref Range   Glucose-Capillary 107 (  H) 70 - 99 mg/dL  Glucose, capillary     Status: Abnormal   Collection Time: 01/01/18  4:03 PM  Result Value Ref Range   Glucose-Capillary 172 (H) 70 - 99 mg/dL  Glucose, capillary     Status: Abnormal   Collection Time: 01/01/18  8:23 PM  Result Value Ref Range   Glucose-Capillary 119 (H) 70 - 99 mg/dL   Comment 1 Notify RN   Glucose, capillary     Status: Abnormal   Collection Time: 01/02/18  7:01 AM  Result Value Ref Range   Glucose-Capillary 214 (H) 70 - 99 mg/dL   Comment 1 Notify RN   Glucose, capillary     Status: Abnormal   Collection Time: 01/02/18 11:33 AM  Result Value Ref Range   Glucose-Capillary 178 (H) 70 - 99 mg/dL   Comment 1 Notify RN     Blood Alcohol level:  Lab Results  Component Value Date   ETH <10 12/26/2017   ETH <10 28/41/3244    Metabolic Disorder Labs: Lab Results  Component Value Date   HGBA1C 5.2 12/30/2017   MPG 102.54 12/30/2017   MPG 134 01/03/2017   No results found for: PROLACTIN Lab Results  Component Value Date   CHOL 157 12/30/2017   TRIG 114 12/30/2017   HDL 44 12/30/2017   CHOLHDL 3.6 12/30/2017   VLDL 23 12/30/2017   LDLCALC 90 12/30/2017   LDLCALC 134 (H) 01/03/2017    Physical Findings: AIMS:  , ,  ,  ,    CIWA:    COWS:     Musculoskeletal: Strength & Muscle Tone: within normal limits Gait & Station: normal Patient leans: N/A  Psychiatric Specialty Exam: Physical Exam  Nursing note and vitals reviewed. Constitutional: She appears well-developed and well-nourished.  HENT:  Head: Normocephalic and atraumatic.  Eyes: Pupils are equal, round, and reactive to light. Conjunctivae are normal.  Neck: Normal range of motion.  Cardiovascular: Regular rhythm and normal heart sounds.  Respiratory: Effort normal. No respiratory  distress.  GI: Soft.  Musculoskeletal: Normal range of motion.  Neurological: She is alert.  Skin: Skin is warm and dry.  Psychiatric: She has a normal mood and affect. Her behavior is normal. Judgment and thought content normal.    Review of Systems  Constitutional: Negative.   HENT: Negative.   Eyes: Negative.   Respiratory: Negative.   Cardiovascular: Negative.   Gastrointestinal: Negative.   Musculoskeletal: Negative.   Skin: Negative.   Neurological: Negative.   Psychiatric/Behavioral: Negative.     Blood pressure (!) 153/82, pulse (!) 101, temperature 98.6 F (37 C), temperature source Oral, resp. rate 18, height 4\' 11"  (1.499 m), weight 68.9 kg, SpO2 95 %.Body mass index is 30.7 kg/m.  General Appearance: Fairly Groomed  Eye Contact:  Good  Speech:  Clear and Coherent  Volume:  Normal  Mood:  Euthymic  Affect:  Constricted  Thought Process:  Goal Directed  Orientation:  Full (Time, Place, and Person)  Thought Content:  Logical  Suicidal Thoughts:  No  Homicidal Thoughts:  No  Memory:  Immediate;   Fair Recent;   Fair Remote;   Fair  Judgement:  Fair  Insight:  Fair  Psychomotor Activity:  Decreased  Concentration:  Concentration: Fair  Recall:  [employer] of Knowledge:  Fair  Language:  Fair  Akathisia:  No  Handed:  Right  AIMS (if indicated):     Assets:  Desire for [place]  ADL's:  Intact  Cognition:  Impaired,  Mild  Sleep:  Number of Hours: 7.3     Treatment Plan Summary: Daily contact with patient to assess and evaluate symptoms and progress in treatment, Medication management and Plan Patient is looking forward to discharge.  Psychiatrically appears to be doing well certainly probably at her baseline.  Blood pressure was a little up today.  If that looks like it has been a trend we may have to increase blood pressure medicine.  Otherwise no change to plan psychoeducation and encouragement  provided.  Alethia Berthold, MD 01/02/2018, 12:56 PM

## 2018-01-02 NOTE — Progress Notes (Signed)
This writer attempted to complete EKG on patient, but she has been sleep and would not wake up for this Probation officer. This Probation officer will notify next shift.

## 2018-01-02 NOTE — Progress Notes (Signed)
Patient ID: Jessica [name], female   DOB: [birth year], 52 y.o.   MRN: [id number] Per State regulations 482.30 this chart was reviewed for medical necessity with respect to the patient's admission/duration of stay.    Next review date: 01/06/18  Debarah Crape, BSN, RN-BC  Case Manager

## 2018-01-02 NOTE — Progress Notes (Signed)
D- Patient alert and oriented. Patient presents in a pleasant mood on assessment stating that she slept good and is excited that she is going to be leaving tomorrow. Patient denies any signs/symptoms of depression and anxiety to this Probation officer. Patient also denies SI, HI, AVH, and pain at this time stating "I'm doing good". Patient's goal for today is to "work on my anger".  A- Scheduled medications administered to patient, per MD orders. Support and encouragement provided.  Routine safety checks conducted every 15 minutes.  Patient informed to notify staff with problems or concerns.  R- No adverse drug reactions noted. Patient contracts for safety at this time. Patient compliant with medications and treatment plan. Patient receptive, calm, and cooperative. Patient interacts well with others on the unit.  Patient remains safe at this time.

## 2018-01-02 NOTE — [facility]HH Suicide Risk Assessment (Signed)
[facility] Discharge Suicide Risk Assessment   Principal Problem: Undifferentiated schizophrenia [facility]) Discharge Diagnoses: Principal Problem:   Undifferentiated schizophrenia ([place]) Active Problems:   Diabetes mellitus without complication ([place])   Mild intellectual disability   Total Time spent with patient: 20 minutes  Musculoskeletal: Strength & Muscle Tone: within normal limits Gait & Station: normal Patient leans: N/A  Psychiatric Specialty Exam: Review of Systems  Neurological: Negative.   Psychiatric/Behavioral: Negative.   All other systems reviewed and are negative.   Blood pressure 131/79, pulse 90, temperature 98 F (36.7 C), temperature source Oral, resp. rate 18, height 4\' 11"  (1.499 m), weight 68.9 kg, SpO2 95 %.Body mass index is 30.7 kg/m.  General Appearance: Casual  Eye Contact::  Good  Speech:  Clear and Coherent409  Volume:  Normal  Mood:  Euthymic  Affect:  Appropriate  Thought Process:  Goal Directed and Descriptions of Associations: Intact  Orientation:  Full (Time, Place, and Person)  Thought Content:  WDL  Suicidal Thoughts:  No  Homicidal Thoughts:  No  Memory:  Immediate;   Fair Recent;   Fair Remote;   Fair  Judgement:  Impaired  Insight:  Shallow  Psychomotor Activity:  Normal  Concentration:  Fair  Recall:  [place]  Language: Fair  Akathisia:  No  Handed:  Right  AIMS (if indicated):     Assets:  Communication Skills Desire for Improvement Financial Resources/Insurance Housing Physical Health Resilience Social Support  Sleep:  Number of Hours: 7.15  Cognition: WNL  ADL's:  Intact   Mental Status Per Nursing Assessment::   On Admission:  Self-harm thoughts  Demographic Factors:  Caucasian  Loss Factors: NA  Historical Factors: Impulsivity  Risk Reduction Factors:   Sense of responsibility to family, Living with another person, especially a relative, Positive social support and Positive therapeutic  relationship  Continued Clinical Symptoms:  Schizophrenia:   Depressive state Paranoid or undifferentiated type  Cognitive Features That Contribute To Risk:  None    Suicide Risk:  Minimal: No identifiable suicidal ideation.  Patients presenting with no risk factors but with morbid ruminations; may be classified as minimal risk based on the severity of the depressive symptoms  Follow-up Information    [facility], [employer]. Go to.   Contact information: [place] [id number] [phone]           Plan Of Care/Follow-up recommendations:  Activity:  as tolerated Diet:  low sodium heart healthy ADA diet Other:  keep follow up appointments  Orson Slick, MD 01/03/2018, 9:04 AM

## 2018-01-02 NOTE — Plan of Care (Signed)
Patient denies SI/HI/AVH as well as any signs/symptoms at this time stating to this writer "I'm doing good". Patient has been present in the milieu and has interacted well with other patients on the unit without any issues. Patient has been in compliance with her prescribed therapeutic regimen and has not voiced any further questions and concerns at this time. Patient has been free from injury and remains safe at this time.   Problem: Education: Goal: Will be free of psychotic symptoms Outcome: Progressing Goal: Knowledge of the prescribed therapeutic regimen will improve Outcome: Progressing   Problem: Coping: Goal: Coping ability will improve Outcome: Progressing Goal: Will verbalize feelings Outcome: Progressing   Problem: Health Behavior/Discharge Planning: Goal: Compliance with prescribed medication regimen will improve Outcome: Progressing   Problem: Self-Concept: Goal: Ability to disclose and discuss suicidal ideas will improve Outcome: Progressing   Problem: Safety: Goal: Periods of time without injury will increase Outcome: Progressing   Problem: Coping: Goal: Ability to identify and develop effective coping behavior will improve Outcome: Progressing   Problem: Safety: Goal: Ability to remain free from injury will improve Outcome: Progressing

## 2018-01-02 NOTE — Discharge Summary (Addendum)
Physician Discharge Summary Note  Patient:  Jessica [name] is an 52 y.o., female MRN:  [id number] DOB:  01-13-1966 Patient phone:  [phone] (home)  Patient address:   [place] [address],  Total Time spent with patient: 20 minutes plus 15 min on care coordination and documentation  Date of Admission:  12/29/2017 Date of Discharge: 01/03/2018  Reason for Admission:   History of Present Illness:   Identifying data. Jessica [name] is a 52 year old female with a history of mild intellectual disability and schizophrenia.  Chief complaint. "I am depressed."  History of present illness. Information was obtained from the patient and the chart. The patient came to [facility] ER from her day program after she complained of suicidal ideation with a plan to step in front of a train in response to auditory command hallucinations. The patient came to our ER two days prior complaining of suicidal and homicidal ideation but returned to her group home with reassurance. She reports 6 months of hallucination that she did not mention to anybody. She lists conflict with her room mate as a major stress. No substance use.  Past psychiatric history. Long history of mental illness, medication trials and hospitalizations. Serious suicide attempt in 2000 by Seroquel overdose.  Family psychiatric history. Denies.  Social history. Has been living in the same group home since December 2018. The room mate is young and bosses her around.  Principal Problem: Undifferentiated schizophrenia [facility]) Discharge Diagnoses: Principal Problem:   Undifferentiated schizophrenia ([place]) Active Problems:   Diabetes mellitus without complication ([place])   Mild intellectual disability  Past Medical History:  Past Medical History:  Diagnosis Date  . Anxiety   . Depression   . Diabetes mellitus without complication ([place])   . GERD (gastroesophageal reflux disease)   . Hypertension    History  reviewed. No pertinent surgical history. Family History: History reviewed. No pertinent family history.  Social History:  Social History   Substance and Sexual Activity  Alcohol Use No  . Frequency: Never     Social History   Substance and Sexual Activity  Drug Use Yes  . Types: Marijuana, "Crack" cocaine   Comment: reports she has not done anything in a long time    Social History   Socioeconomic History  . Marital status: Single    Spouse name: Not on file  . Number of children: Not on file  . Years of education: Not on file  . Highest education level: Not on file  Occupational History  . Not on file  Social Needs  . Financial resource strain: Not on file  . Food insecurity:    Worry: Not on file    Inability: Not on file  . Transportation needs:    Medical: Not on file    Non-medical: Not on file  Tobacco Use  . Smoking status: Former Research scientist (life sciences)  . Smokeless tobacco: Never Used  Substance and Sexual Activity  . Alcohol use: No    Frequency: Never  . Drug use: Yes    Types: Marijuana, "Crack" cocaine    Comment: reports she has not done anything in a long time  . Sexual activity: Not on file  Lifestyle  . Physical activity:    Days per week: Not on file    Minutes per session: Not on file  . Stress: Not on file  Relationships  . Social connections:    Talks on phone: Not on file    Gets together: Not on file  Attends religious service: Not on file    Active member of club or organization: Not on file    Attends meetings of clubs or organizations: Not on file    Relationship status: Not on file  Other Topics Concern  . Not on file  Social History Narrative  . Not on file    Hospital Course:    Jessica [name] is a 52 year old female with a history of schizophrenia admitted for suicidal ideation with a plan to go to railroad tracks. Her medications were adjusted and she tolerated them well. At the time of discharge, the patient is no longer suicidal, homicida  or psychotic. She is able to contract for safety. She is forward thinking and optimistic about the future.  #Mood and psychosis, improved -continue Latuda to 120 mg daily -Cymbalta 60 mg daily -continue Lamictal 100 mg BID -Trazodone 100 mg nightly  #Hypothyroid -Synthroid 150 ucg daily  #DM -Lantus 15 units BID -Metformin 500 mg BID -ADA diet, SSI -Neurontin 600 mg TID  #HTN -continue antihypertensives  #GERD -Protonix 40 mg daily  #Smoking cessation -nicotine patch is available  #Labs -lipid panel, TSH, A1C are normal -EKG reviewed, NSR with QTc 472  #Disposition -discharge back to her group home -follow up with primary psychiatrist at [facility]  Physical Findings: AIMS:  , ,  ,  ,    CIWA:    COWS:     Musculoskeletal: Strength & Muscle Tone: within normal limits Gait & Station: normal Patient leans: N/A  Psychiatric Specialty Exam: Physical Exam  Nursing note and vitals reviewed. Psychiatric: She has a normal mood and affect. Her speech is normal and behavior is normal. Thought content normal. Cognition and memory are normal. She expresses impulsivity.    Review of Systems  Neurological: Negative.   Psychiatric/Behavioral: Negative.   All other systems reviewed and are negative.   Blood pressure 131/79, pulse 90, temperature 98 F (36.7 C), temperature source Oral, resp. rate 18, height 4\' 11"  (1.499 m), weight 68.9 kg, SpO2 95 %.Body mass index is 30.7 kg/m.  General Appearance: Casual  Eye Contact:  Good  Speech:  Clear and Coherent  Volume:  Normal  Mood:  Euthymic  Affect:  Appropriate  Thought Process:  Goal Directed and Descriptions of Associations: Intact  Orientation:  Full (Time, Place, and Person)  Thought Content:  WDL  Suicidal Thoughts:  No  Homicidal Thoughts:  No  Memory:  Immediate;   Fair Recent;   Fair Remote;   Fair  Judgement:  Impaired  Insight:  Shallow  Psychomotor Activity:  Normal  Concentration:   Concentration: Fair and Attention Span: Fair  Recall:  [employer] of Knowledge:  Fair  Language:  Fair  Akathisia:  No  Handed:  Right  AIMS (if indicated):     Assets:  Communication Skills Desire for Improvement Financial Resources/Insurance Housing Physical Health Resilience Social Support  ADL's:  Intact  Cognition:  WNL  Sleep:  Number of Hours: 7.15     Have you used any form of tobacco in the last 30 days? (Cigarettes, Smokeless Tobacco, Cigars, and/or Pipes): No  Has this patient used any form of tobacco in the last 30 days? (Cigarettes, Smokeless Tobacco, Cigars, and/or Pipes) Yes, Yes, A prescription for an FDA-approved tobacco cessation medication was offered at discharge and the patient refused  Blood Alcohol level:  Lab Results  Component Value Date   [facility] <10 12/26/2017   ETH <10 11/12/8525    Metabolic Disorder Labs:  Lab Results  Component Value Date   HGBA1C 5.2 12/30/2017   MPG 102.54 12/30/2017   MPG 134 01/03/2017   No results found for: PROLACTIN Lab Results  Component Value Date   CHOL 157 12/30/2017   TRIG 114 12/30/2017   HDL 44 12/30/2017   CHOLHDL 3.6 12/30/2017   VLDL 23 12/30/2017   LDLCALC 90 12/30/2017   LDLCALC 134 (H) 01/03/2017    See Psychiatric Specialty Exam and Suicide Risk Assessment completed by Attending Physician prior to discharge.  Discharge destination:  Home  Is patient on multiple antipsychotic therapies at discharge:  No   Has Patient had three or more failed trials of antipsychotic monotherapy by history:  No  Recommended Plan for Multiple Antipsychotic Therapies: NA  Discharge Instructions    Diet - low sodium heart healthy   Complete by:  As directed    Increase activity slowly   Complete by:  As directed      Allergies as of 01/03/2018      Reactions   Abilify [aripiprazole]    Chokes on food   Haldol [haloperidol] Other (See Comments)   Dizziness   Risperdal [risperidone]    Leg weakness       Medication List    STOP taking these medications   benztropine 1 MG tablet Commonly known as:  COGENTIN   LEVEMIR FLEXTOUCH 100 UNIT/ML Pen Generic drug:  Insulin Detemir Replaced by:  insulin detemir 100 UNIT/ML injection   OS-CAL CALCIUM + D3 500-200 MG-UNIT Tabs Generic drug:  Calcium Carb-Cholecalciferol   RESTASIS 0.05 % ophthalmic emulsion Generic drug:  cycloSPORINE   valACYclovir 500 MG tablet Commonly known as:  VALTREX     TAKE these medications     Indication  acetaminophen 325 MG tablet Commonly known as:  TYLENOL Take 2 tablets (650 mg total) by mouth every 6 (six) hours as needed for mild pain or headache.  Indication:  Fever, Pain   carvedilol 6.25 MG tablet Commonly known as:  COREG Take 1 tablet (6.25 mg total) by mouth 2 (two) times daily. For high blood pressure  Indication:  High Blood Pressure of Unknown Cause   DULoxetine 60 MG capsule Commonly known as:  CYMBALTA Take 1 capsule (60 mg total) by mouth daily. For depression  Indication:  Major Depressive Disorder   ferrous sulfate 325 (65 FE) MG tablet Commonly known as:  FEROSUL Take 1 tablet (325 mg total) by mouth 2 (two) times daily. For anemia  Indication:  Anemia From Inadequate Iron in the Body   gabapentin 600 MG tablet Commonly known as:  NEURONTIN Take 1 tablet (600 mg total) by mouth 3 (three) times daily. For agitation  Indication:  Agitation   hydrOXYzine 25 MG tablet Commonly known as:  ATARAX/VISTARIL Take 1 tablet (25 mg total) by mouth 3 (three) times daily as needed for anxiety.  Indication:  Feeling Anxious   insulin detemir 100 UNIT/ML injection Commonly known as:  LEVEMIR Inject 0.15 mLs (15 Units total) into the skin 2 (two) times daily. Replaces:  LEVEMIR FLEXTOUCH 100 UNIT/ML Pen  Indication:  Type 2 Diabetes   lamoTRIgine 100 MG tablet Commonly known as:  LAMICTAL Take 1 tablet (100 mg total) by mouth 2 (two) times daily. What changed:    when to take  this  additional instructions  Indication:  Manic-Depression   levothyroxine 150 MCG tablet Commonly known as:  SYNTHROID, LEVOTHROID Take 1 tablet (150 mcg total) by mouth daily. For thyroid function hormone replacement  Indication:  Underactive Thyroid   lisinopril 10 MG tablet Commonly known as:  PRINIVIL,ZESTRIL Take 1 tablet (10 mg total) by mouth daily. For high blood pressure  Indication:  High Blood Pressure Disorder   loratadine 10 MG tablet Commonly known as:  CLARITIN Take 1 tablet (10 mg total) by mouth daily. For allergies  Indication:  Perennial Allergic Rhinitis, Hayfever   Lurasidone HCl 120 MG Tabs Take 1 tablet (120 mg total) by mouth daily with breakfast. What changed:    medication strength  how much to take  when to take this  additional instructions  Indication:  Schizophrenia   metFORMIN 500 MG tablet Commonly known as:  GLUCOPHAGE Take 1 tablet (500 mg total) by mouth 2 (two) times daily. For diabetes management  Indication:  Type 2 Diabetes   naproxen 500 MG tablet Commonly known as:  NAPROSYN Take 1 tablet (500 mg total) by mouth 2 (two) times daily with a meal.  Indication:  Pain   pantoprazole 40 MG tablet Commonly known as:  PROTONIX Take 1 tablet (40 mg total) by mouth daily. For acid reflux  Indication:  Gastroesophageal Reflux Disease   traZODone 100 MG tablet Commonly known as:  DESYREL Take 1 tablet (100 mg total) by mouth at bedtime as needed for sleep.  Indication:  Trouble Sleeping      Follow-up Information    [facility]. Go to.   Contact information: [address] [place] [address] [phone]           Follow-up recommendations:  Activity:  as tolrtated Diet:  low sodium heart healthy ADA diet Other:  keep follow up appointments  Comments:     Signed: Orson Slick, MD 01/03/2018, 9:04 AM

## 2018-01-03 LAB — GLUCOSE, CAPILLARY
Glucose-Capillary: 134 mg/dL — ABNORMAL HIGH (ref 70–99)
Glucose-Capillary: 145 mg/dL — ABNORMAL HIGH (ref 70–99)
Glucose-Capillary: 146 mg/dL — ABNORMAL HIGH (ref 70–99)

## 2018-01-03 NOTE — Progress Notes (Addendum)
CSW contacted [name] to attempt to schedule follow up appt.  Message left. Winferd Humphrey, MSW, LCSW Clinical Social Worker 01/03/2018 9:27 AM   CSW spoke to Arizona Constable from [place] home.  She said they have not been able to reach anyone at [facility] either.  Pt has appt the first week of January scheduled--Francis not sure the exact date but will make sure she gets there. Winferd Humphrey, MSW, LCSW Clinical Social Worker 01/03/2018 9:32 AM

## 2018-01-03 NOTE — Progress Notes (Signed)
Recreation Therapy Notes  INPATIENT RECREATION TR PLAN  Patient Details Name: Jessica [name] MRN: [id number] DOB: Sep 02, 1965 Today's Date: 01/03/2018  Rec Therapy Plan Is patient appropriate for Therapeutic Recreation?: Yes Treatment times per week: At least 3 Estimated Length of Stay: 5-7 days TR Treatment/Interventions: Group participation (Comment)  Discharge Criteria Pt will be discharged from therapy if:: Discharged Treatment plan/goals/alternatives discussed and agreed upon by:: Patient/family  Discharge Summary Short term goals set: Patient will identify 3 triggers for depression within 5 recreation therapy group sessions Short term goals met: Adequate for discharge Progress toward goals comments: Groups attended Which groups?: Stress management, Coping skills Reason goals not met: N/A Therapeutic equipment acquired: N/A Reason patient discharged from therapy: Discharge from hospital Pt/family agrees with progress & goals achieved: Yes Date patient discharged from therapy: 01/03/18   Jaleeyah Munce 01/03/2018, 2:08 PM

## 2018-01-03 NOTE — NC FL2 (Signed)
[place] LEVEL OF CARE SCREENING TOOL     IDENTIFICATION  Patient Name: Jessica [name] Birthdate: 06/11/1965 Sex: female Admission Date (Current Location): 12/29/2017  [place] and [place] Number:  Selena Lesser [id number] [place] and Address:  [facility], [address] [place], [place] [address]      Provider Number: [id number]  Attending Physician Name and Address:  Clovis Fredrickson, MD  Relative Name and Phone Number:  Lauranne Beyersdorf, brother, [phone]    Current Level of Care: Hospital Recommended Level of Care: Other (Comment)(group home) Prior Approval Number:    Date Approved/Denied:   PASRR Number:    Discharge Plan: Other (Comment)(group home)    Current Diagnoses: Patient Active Problem List   Diagnosis Date Noted  . Undifferentiated schizophrenia ([place]) 12/29/2017  . Mild intellectual disability 12/27/2017  . Diabetes mellitus without complication ([place]) 26/94/8546  . Adjustment disorder with mixed disturbance of emotions and conduct 07/21/2017  . Schizophrenia ([place]) 01/02/2017    Orientation RESPIRATION BLADDER Height & Weight     Self, Time, Situation, Place  Normal Continent Weight: 68.9 kg Height:  4\' 11"  (149.9 cm)  BEHAVIORAL SYMPTOMS/MOOD NEUROLOGICAL BOWEL NUTRITION STATUS  Physically abusive (none) Continent (none)  AMBULATORY STATUS COMMUNICATION OF NEEDS Skin   Independent Verbally Normal                       Personal Care Assistance Level of Assistance  (none)           Functional Limitations Info  (none)          SPECIAL CARE FACTORS FREQUENCY  (none)                    Contractures Contractures Info: Not present    Additional Factors Info  Code Status(full) Code Status Info: full             Current Medications (01/03/2018):  This is the current hospital active medication list Current Facility-Administered Medications  Medication Dose Route Frequency Provider  Last Rate Last Dose  . acetaminophen (TYLENOL) tablet 650 mg  650 mg Oral Q6H PRN Pucilowska, Jolanta B, MD   650 mg at 01/03/18 0809  . alum & mag hydroxide-simeth (MAALOX/MYLANTA) 200-200-20 MG/5ML suspension 30 mL  30 mL Oral Q4H PRN Pucilowska, Jolanta B, MD   30 mL at 12/31/17 2134  . carvedilol (COREG) tablet 6.25 mg  6.25 mg Oral BID WC Pucilowska, Jolanta B, MD   6.25 mg at 01/03/18 0807  . docusate sodium (COLACE) capsule 100 mg  100 mg Oral BID Pucilowska, Jolanta B, MD   100 mg at 01/03/18 0807  . DULoxetine (CYMBALTA) DR capsule 60 mg  60 mg Oral Daily Pucilowska, Jolanta B, MD   60 mg at 01/03/18 0807  . gabapentin (NEURONTIN) capsule 600 mg  600 mg Oral TID Pucilowska, Jolanta B, MD   600 mg at 01/03/18 0807  . hydrOXYzine (ATARAX/VISTARIL) tablet 25 mg  25 mg Oral TID PRN Pucilowska, Jolanta B, MD   25 mg at 01/02/18 2011  . insulin aspart (novoLOG) injection 0-15 Units  0-15 Units Subcutaneous TID WC Pucilowska, Jolanta B, MD   2 Units at 01/03/18 0808  . insulin aspart (novoLOG) injection 0-5 Units  0-5 Units Subcutaneous QHS Pucilowska, Jolanta B, MD      . insulin detemir (LEVEMIR) injection 15 Units  15 Units Subcutaneous BID Pucilowska, Jolanta B, MD   15 Units at 01/02/18 2158  .  lamoTRIgine (LAMICTAL) tablet 100 mg  100 mg Oral BID Pucilowska, Jolanta B, MD   100 mg at 01/03/18 0807  . levothyroxine (SYNTHROID, LEVOTHROID) tablet 150 mcg  150 mcg Oral Q0600 Pucilowska, Jolanta B, MD   150 mcg at 01/03/18 0607  . lisinopril (PRINIVIL,ZESTRIL) tablet 10 mg  10 mg Oral Daily Pucilowska, Jolanta B, MD   10 mg at 01/03/18 0807  . lurasidone (LATUDA) tablet 120 mg  120 mg Oral Q breakfast Pucilowska, Jolanta B, MD   120 mg at 01/03/18 0807  . magnesium hydroxide (MILK OF MAGNESIA) suspension 30 mL  30 mL Oral Daily PRN Pucilowska, Jolanta B, MD      . metFORMIN (GLUCOPHAGE) tablet 500 mg  500 mg Oral BID WC Pucilowska, Jolanta B, MD   500 mg at 01/03/18 0807  . nicotine (NICODERM CQ  - dosed in mg/24 hours) patch 21 mg  21 mg Transdermal Q0600 Pucilowska, Jolanta B, MD   21 mg at 01/01/18 0554  . pantoprazole (PROTONIX) EC tablet 40 mg  40 mg Oral Daily Pucilowska, Jolanta B, MD   40 mg at 01/03/18 0807  . traZODone (DESYREL) tablet 100 mg  100 mg Oral QHS PRN Pucilowska, Jolanta B, MD   100 mg at 01/02/18 2140     Discharge Medications: Please see discharge summary for a list of discharge medications.  Relevant Imaging Results:  Relevant Lab Results:   Additional Information    Joanne Chars, LCSW

## 2018-01-03 NOTE — Progress Notes (Signed)
  [facility] Adult Case Management Discharge Plan :  Will you be returning to the same living situation after discharge:  Yes,  group home At discharge, do you have transportation home?: Yes,  brother Do you have the ability to pay for your medications: Yes,  MCR/MCD/Tricare  Release of information consent forms completed and in the chart;  Patient's signature needed at discharge.  Patient to Follow up at: Follow-up Information    [facility] Follow up.   Why:  Please attend your scheduled appt or attend a walk in appt between 9am-4pm, Monday-Friday. Contact information: [place] [address] [phone]           Next level of care provider has access to [place] and Suicide Prevention discussed: Yes,  with brother  Have you used any form of tobacco in the last 30 days? (Cigarettes, Smokeless Tobacco, Cigars, and/or Pipes): No  Has patient been referred to the Quitline?: N/A patient is not a smoker  Patient has been referred for addiction treatment: N/A  Joanne Chars, LCSW 01/03/2018, 10:11 AM

## 2018-01-03 NOTE — Progress Notes (Signed)
Recreation Therapy Notes  Date: 01/03/2018  Time: 9:30 pm   Location: Craft Room   Behavioral response: N/A   Intervention Topic: Problem Solving  Discussion/Intervention: Patient did not attend group.   Clinical Observations/Feedback:  Patient did not attend group.   Wai Litt LRT/CTRS        Monzerrath Mcburney 01/03/2018 10:20 AM

## 2018-01-03 NOTE — Progress Notes (Signed)
Patient ID: Jessica [name], female   DOB: Jan 22, 1965, 52 y.o.   MRN: [id number]  Discharge Note:  Patient denies SI/HI/AVH at this time. Discharge instructions, AVS, transition record, and prescriptions gone over with patient and family. Patient agrees to comply with medication management, follow-up visit, and outpatient therapy. Patient belongings returned to patient. Patient and family questions and concerns addressed and answered. Patient ambulatory off unit. Patient discharged to group home with brother.

## 2018-01-03 NOTE — Progress Notes (Signed)
D - Patient was in her room upon arrival to the unit. Patient was pleasant during assessment and medication administration. Patient denies SI/HI/AVH and depression. Patient stated she had generalized pain 10/10. See MAR. Patient stated she had anxiety because of one of the patients on the unit who was going up and down the hall and all around the nurses station being loud yelling and being agitated. See MAR. Patient is excited that she will be leaving tomorrow.   A - Patient was compliant with medication administration per MD orders and procedures on the unit. Patient given education. Patient given support and encouragement. Patient informed to let staff know if there are any issues or problems on the unit.   R - Patient verbally contracts for safety with this Probation officer. Patient being monitored Q 15 minutes for safety per unit protocol. Patient remains safe on the unit at this time.

## 2018-01-03 NOTE — Plan of Care (Signed)
Patient compliant with medication administration per MD orders. Patient denies SI/HI/AVH, anxiety and depression.   Problem: Education: Goal: Will be free of psychotic symptoms Outcome: Progressing   Problem: Health Behavior/Discharge Planning: Goal: Compliance with prescribed medication regimen will improve Outcome: Progressing

## 2018-01-17 DIAGNOSIS — F419 Anxiety disorder, unspecified: Secondary | ICD-10-CM | POA: Diagnosis not present

## 2018-01-17 DIAGNOSIS — F209 Schizophrenia, unspecified: Secondary | ICD-10-CM | POA: Diagnosis not present

## 2018-01-19 ENCOUNTER — Emergency Department
Admission: EM | Admit: 2018-01-19 | Discharge: 2018-01-20 | Disposition: A | Discharge status: Home / Self Care | Payer: Medicare Other | Attending: Emergency Medicine | Admitting: Emergency Medicine

## 2018-01-19 ENCOUNTER — Other Ambulatory Visit: Payer: Self-pay

## 2018-01-19 ENCOUNTER — Encounter: Payer: Self-pay | Admitting: Emergency Medicine

## 2018-01-19 DIAGNOSIS — R45851 Suicidal ideations: Secondary | ICD-10-CM | POA: Insufficient documentation

## 2018-01-19 DIAGNOSIS — F419 Anxiety disorder, unspecified: Secondary | ICD-10-CM | POA: Diagnosis not present

## 2018-01-19 DIAGNOSIS — Z79899 Other long term (current) drug therapy: Secondary | ICD-10-CM | POA: Insufficient documentation

## 2018-01-19 DIAGNOSIS — F4325 Adjustment disorder with mixed disturbance of emotions and conduct: Secondary | ICD-10-CM | POA: Diagnosis not present

## 2018-01-19 DIAGNOSIS — F70 Mild intellectual disabilities: Secondary | ICD-10-CM | POA: Insufficient documentation

## 2018-01-19 DIAGNOSIS — F3163 Bipolar disorder, current episode mixed, severe, without psychotic features: Secondary | ICD-10-CM | POA: Insufficient documentation

## 2018-01-19 DIAGNOSIS — I10 Essential (primary) hypertension: Secondary | ICD-10-CM | POA: Diagnosis not present

## 2018-01-19 DIAGNOSIS — F99 Mental disorder, not otherwise specified: Secondary | ICD-10-CM | POA: Diagnosis present

## 2018-01-19 DIAGNOSIS — Z87891 Personal history of nicotine dependence: Secondary | ICD-10-CM | POA: Insufficient documentation

## 2018-01-19 DIAGNOSIS — E119 Type 2 diabetes mellitus without complications: Secondary | ICD-10-CM | POA: Insufficient documentation

## 2018-01-19 DIAGNOSIS — F209 Schizophrenia, unspecified: Secondary | ICD-10-CM | POA: Insufficient documentation

## 2018-01-19 DIAGNOSIS — Z794 Long term (current) use of insulin: Secondary | ICD-10-CM | POA: Diagnosis not present

## 2018-01-19 DIAGNOSIS — F3162 Bipolar disorder, current episode mixed, moderate: Secondary | ICD-10-CM

## 2018-01-19 LAB — URINE DRUG SCREEN, QUALITATIVE (ARMC ONLY)
Amphetamines, Ur Screen: NOT DETECTED
BARBITURATES, UR SCREEN: NOT DETECTED
Benzodiazepine, Ur Scrn: NOT DETECTED
Cannabinoid 50 Ng, Ur ~~LOC~~: NOT DETECTED
Cocaine Metabolite,Ur ~~LOC~~: NOT DETECTED
MDMA (Ecstasy)Ur Screen: NOT DETECTED
Methadone Scn, Ur: NOT DETECTED
Opiate, Ur Screen: NOT DETECTED
Phencyclidine (PCP) Ur S: NOT DETECTED
Tricyclic, Ur Screen: NOT DETECTED

## 2018-01-19 LAB — CBC
HEMATOCRIT: 38.3 % (ref 36.0–46.0)
Hemoglobin: 12.3 g/dL (ref 12.0–15.0)
MCH: 30 pg (ref 26.0–34.0)
MCHC: 32.1 g/dL (ref 30.0–36.0)
MCV: 93.4 fL (ref 80.0–100.0)
Platelets: 75 10*3/uL — ABNORMAL LOW (ref 150–400)
RBC: 4.1 MIL/uL (ref 3.87–5.11)
RDW: 13 % (ref 11.5–15.5)
WBC: 5.9 10*3/uL (ref 4.0–10.5)
nRBC: 0 % (ref 0.0–0.2)

## 2018-01-19 LAB — COMPREHENSIVE METABOLIC PANEL
ALT: 22 U/L (ref 0–44)
ANION GAP: 9 (ref 5–15)
AST: 25 U/L (ref 15–41)
Albumin: 4.2 g/dL (ref 3.5–5.0)
Alkaline Phosphatase: 82 U/L (ref 38–126)
BUN: 17 mg/dL (ref 6–20)
CO2: 30 mmol/L (ref 22–32)
Calcium: 7.8 mg/dL — ABNORMAL LOW (ref 8.9–10.3)
Chloride: 97 mmol/L — ABNORMAL LOW (ref 98–111)
Creatinine, Ser: 0.74 mg/dL (ref 0.44–1.00)
GFR calc Af Amer: >60 mL/min (ref >60)
GFR calc non Af Amer: >60 mL/min (ref >60)
GLUCOSE: 98 mg/dL (ref 70–99)
Potassium: 3.7 mmol/L (ref 3.5–5.1)
Sodium: 136 mmol/L (ref 135–145)
Total Bilirubin: 0.7 mg/dL (ref 0.3–1.2)
Total Protein: 7.6 g/dL (ref 6.5–8.1)

## 2018-01-19 LAB — ETHANOL: Alcohol, Ethyl (B): <10 mg/dL (ref <10)

## 2018-01-19 LAB — SALICYLATE LEVEL: Salicylate Lvl: <7 mg/dL (ref 2.8–30.0)

## 2018-01-19 LAB — ACETAMINOPHEN LEVEL: Acetaminophen (Tylenol), Serum: <10 ug/mL — ABNORMAL LOW (ref 10–30)

## 2018-01-19 NOTE — ED Triage Notes (Signed)
First nurse note-arrived with [employer] voluntary for SI. In chairs between triage rooms until pt gets triaged.

## 2018-01-19 NOTE — ED Notes (Signed)
This RN spoke with Ms Jessica [name] who is the group home owner who reports pt had become upset several times today with her roommate then with her when she tried to keep her and the roommate from fighting. After supper this evening pt came into the kitchen and stated she wanted to come to the hospital because she wanted to kill herself. Ms Jessica [name] states that she will take back Ms Jessica [name] if she is discharged.

## 2018-01-19 NOTE — ED Provider Notes (Signed)
[facility] Emergency Department Provider Note ____________________________________________   First MD Initiated Contact with Patient 01/19/18 1941     (approximate)  I have reviewed the triage vital signs and the nursing notes.   HISTORY  Chief Complaint Suicidal    HPI Jessica [name] is a 53 y.o. female with PMH as noted below who presents with suicidal ideation, acute onset, and states that she wants to stab herself in the chest with a knife.  The patient states that she has tried to hurt herself in the past.  She denies any acute medical complaints.   Past Medical History:  Diagnosis Date  . Anxiety   . Depression   . Diabetes mellitus without complication ([place])   . GERD (gastroesophageal reflux disease)   . Hypertension     Patient Active Problem List   Diagnosis Date Noted  . Undifferentiated schizophrenia ([place]) 12/29/2017  . Mild intellectual disability 12/27/2017  . Diabetes mellitus without complication ([place]) 51/88/4166  . Adjustment disorder with mixed disturbance of emotions and conduct 07/21/2017  . Schizophrenia ([place]) 01/02/2017    History reviewed. No pertinent surgical history.  Prior to Admission medications   Medication Sig Start Date End Date Taking? Authorizing Provider  acetaminophen (TYLENOL) 325 MG tablet Take 2 tablets (650 mg total) by mouth every 6 (six) hours as needed for mild pain or headache. 01/15/17   Lindell Spar I, NP  carvedilol (COREG) 6.25 MG tablet Take 1 tablet (6.25 mg total) by mouth 2 (two) times daily. For high blood pressure 01/15/17   Nwoko, Herbert Pun I, NP  DULoxetine (CYMBALTA) 60 MG capsule Take 1 capsule (60 mg total) by mouth daily. For depression 01/16/17   Lindell Spar I, NP  ferrous sulfate (FEROSUL) 325 (65 FE) MG tablet Take 1 tablet (325 mg total) by mouth 2 (two) times daily. For anemia 01/15/17   Lindell Spar I, NP  gabapentin (NEURONTIN) 600 MG tablet Take 1 tablet (600 mg total) by mouth 3  (three) times daily. For agitation Patient not taking: Reported on 07/09/2017 01/15/17   Lindell Spar I, NP  hydrOXYzine (ATARAX/VISTARIL) 25 MG tablet Take 1 tablet (25 mg total) by mouth 3 (three) times daily as needed for anxiety. Patient not taking: Reported on 07/09/2017 01/15/17   Lindell Spar I, NP  insulin detemir (LEVEMIR) 100 UNIT/ML injection Inject 0.15 mLs (15 Units total) into the skin 2 (two) times daily. 01/02/18   Pucilowska, Herma Ard B, MD  lamoTRIgine (LAMICTAL) 100 MG tablet Take 1 tablet (100 mg total) by mouth 2 (two) times daily. 01/02/18   Pucilowska, Jolanta B, MD  levothyroxine (SYNTHROID, LEVOTHROID) 150 MCG tablet Take 1 tablet (150 mcg total) by mouth daily. For thyroid function hormone replacement 01/15/17   Lindell Spar I, NP  lisinopril (PRINIVIL,ZESTRIL) 10 MG tablet Take 1 tablet (10 mg total) by mouth daily. For high blood pressure 01/16/17   Nwoko, Herbert Pun I, NP  loratadine (CLARITIN) 10 MG tablet Take 1 tablet (10 mg total) by mouth daily. For allergies 01/16/17   Lindell Spar I, NP  lurasidone 120 MG TABS Take 1 tablet (120 mg total) by mouth daily with breakfast. 01/03/18   Pucilowska, Jolanta B, MD  metFORMIN (GLUCOPHAGE) 500 MG tablet Take 1 tablet (500 mg total) by mouth 2 (two) times daily. For diabetes management 01/15/17   Lindell Spar I, NP  naproxen (NAPROSYN) 500 MG tablet Take 1 tablet (500 mg total) by mouth 2 (two) times daily with a meal. 07/10/17  Lavonia Drafts, MD  pantoprazole (PROTONIX) 40 MG tablet Take 1 tablet (40 mg total) by mouth daily. For acid reflux 01/16/17   Lindell Spar I, NP  traZODone (DESYREL) 100 MG tablet Take 1 tablet (100 mg total) by mouth at bedtime as needed for sleep. 01/15/17   Encarnacion Slates, NP    Allergies Abilify [aripiprazole]; Haldol [haloperidol]; and Risperdal [risperidone]  History reviewed. No pertinent family history.  Social History Social History   Tobacco Use  . Smoking status: Former Research scientist (life sciences)  .  Smokeless tobacco: Never Used  Substance Use Topics  . Alcohol use: No    Frequency: Never  . Drug use: Yes    Types: Marijuana, "Crack" cocaine    Comment: reports she has not done anything in a long time    Review of Systems  Constitutional: No fever. Eyes: No redness. ENT: No neck pain. Cardiovascular: Denies chest pain. Respiratory: Denies shortness of breath. Gastrointestinal: No vomiting or diarrhea.  Genitourinary: Negative for flank pain.  Musculoskeletal: Negative for back pain. Skin: Negative for rash. Neurological: Negative for headache.   ____________________________________________   PHYSICAL EXAM:  VITAL SIGNS: ED Triage Vitals  Enc Vitals Group     BP 01/19/18 1934 (!) 154/72     Pulse Rate 01/19/18 1934 72     Resp 01/19/18 1934 18     Temp 01/19/18 1934 97.7 F (36.5 C)     Temp Source 01/19/18 1934 Oral     SpO2 01/19/18 1934 96 %     Weight 01/19/18 1935 152 lb (68.9 kg)     Height 01/19/18 1935 4\' 11"  (1.499 m)     Head Circumference --      Peak Flow --      Pain Score 01/19/18 1935 0     Pain Loc --      Pain Edu? --      Excl. in [place]? --     Constitutional: Alert and oriented. Well appearing and in no acute distress. Eyes: Conjunctivae are normal.  Head: Atraumatic. Nose: No congestion/rhinnorhea. Mouth/Throat: Mucous membranes are moist.   Neck: Normal range of motion.  Cardiovascular: Grossly normal heart sounds.  Good peripheral circulation. Respiratory: Normal respiratory effort.   Gastrointestinal: No distention.  Musculoskeletal: Extremities warm and well perfused.  Neurologic:  Normal speech and language. No gross focal neurologic deficits are appreciated.  Skin:  Skin is warm and dry. No rash noted. Psychiatric: Calm and cooperative.  ____________________________________________   LABS (all labs ordered are listed, but only abnormal results are displayed)  Labs Reviewed  COMPREHENSIVE METABOLIC PANEL - Abnormal; Notable  for the following components:      Result Value   Chloride 97 (*)    Calcium 7.8 (*)    All other components within normal limits  ACETAMINOPHEN LEVEL - Abnormal; Notable for the following components:   Acetaminophen (Tylenol), Serum <10 (*)    All other components within normal limits  CBC - Abnormal; Notable for the following components:   Platelets 75 (*)    All other components within normal limits  ETHANOL  SALICYLATE LEVEL  URINE DRUG SCREEN, QUALITATIVE ([name] ONLY)   ____________________________________________  EKG   ____________________________________________  RADIOLOGY    ____________________________________________   PROCEDURES  Procedure(s) performed: No  Procedures  Critical Care performed: No ____________________________________________   INITIAL IMPRESSION / ASSESSMENT AND PLAN / ED COURSE  Pertinent labs & imaging results that were available during my care of the patient were reviewed by me and  considered in my medical decision making (see chart for details).  53 year old female with PMH as noted above presents reporting that she has suicidal ideation.  She is here voluntarily.  She states that she thought about getting a knife to stab herself.  Police was called by the group home to bring the patient to the ED.  She denies any acute medical complaints.  On exam the patient is comfortable appearing.  Her vital signs are normal except for hypertension.  She is calm and cooperative.  She presents voluntarily and is able to contract for safety.  Based on my initial evaluation I will not place her under involuntary commitment at this time.  We will obtain psychiatry consultation, lab work-up for medical clearance, and reassess.  ----------------------------------------- 11:49 PM on 01/19/2018 -----------------------------------------  Lab work-up is unremarkable.  The patient remains stable.  She is awaiting [facility] tele-psychiatry evaluation.  I am  signing the patient out to the oncoming physician Dr. Beather Arbour. ____________________________________________   FINAL CLINICAL IMPRESSION(S) / ED DIAGNOSES  Final diagnoses:  Suicidal ideation      NEW MEDICATIONS STARTED DURING THIS VISIT:  New Prescriptions   No medications on file     Note:  This document was prepared using Dragon voice recognition software and may include unintentional dictation errors.    Arta Silence, MD 01/19/18 [id number]

## 2018-01-19 NOTE — ED Triage Notes (Addendum)
Pt reports she has been wanting to get a knife and kill herself so her group home owner called the police to bring her to the ED. Pt contracts for safety with this RN and states she does not wish to return to her group home as she does not like it there.

## 2018-01-19 NOTE — ED Notes (Signed)
This RN spoke with patients sister at the request of the patinet. Sister updated on plan of care.

## 2018-01-20 DIAGNOSIS — F3163 Bipolar disorder, current episode mixed, severe, without psychotic features: Secondary | ICD-10-CM | POA: Diagnosis not present

## 2018-01-20 MED ORDER — TRAZODONE HCL 100 MG PO TABS
100.0000 mg | ORAL_TABLET | Freq: Once | ORAL | Status: AC
  Administered 2018-01-20: 100 mg via ORAL
  Filled 2018-01-20: qty 1

## 2018-01-20 MED ORDER — METFORMIN HCL 500 MG PO TABS
500.0000 mg | ORAL_TABLET | Freq: Once | ORAL | Status: AC
  Administered 2018-01-20: 500 mg via ORAL
  Filled 2018-01-20: qty 1

## 2018-01-20 MED ORDER — LURASIDONE HCL 40 MG PO TABS
80.0000 mg | ORAL_TABLET | Freq: Once | ORAL | Status: AC
  Administered 2018-01-20: 80 mg via ORAL
  Filled 2018-01-20: qty 2

## 2018-01-20 MED ORDER — INSULIN DETEMIR 100 UNIT/ML ~~LOC~~ SOLN
15.0000 [IU] | Freq: Once | SUBCUTANEOUS | Status: DC
  Filled 2018-01-20: qty 0.15

## 2018-01-20 MED ORDER — LAMOTRIGINE 100 MG PO TABS
100.0000 mg | ORAL_TABLET | Freq: Once | ORAL | Status: AC
  Administered 2018-01-20: 100 mg via ORAL
  Filled 2018-01-20: qty 1

## 2018-01-20 NOTE — ED Provider Notes (Signed)
-----------------------------------------   1:33 AM on 01/20/2018 -----------------------------------------  Patient was evaluated by [facility] psychiatrist Dr. Cherly Beach who recommends discharge back to group home.  Group home unable to transport patient overnight.  Patient will remain in the ED and be discharged in the morning to her group home.   Paulette Blanch, MD 01/20/18 [phone]

## 2018-01-20 NOTE — Discharge Instructions (Signed)
Continue all medications as directed by your doctor.  Return to the ER for worsening symptoms, feelings of hurting yourself or others, or other concerns.

## 2018-01-20 NOTE — ED Notes (Signed)
Pt. Transferred to [facility] from ED to room 5 after screening for contraband. Report to include Situation, Background, Assessment and Recommendations from Ann RN. Pt. Oriented to unit including Q15 minute rounds as well as the security cameras for their protection. Patient is alert and oriented, warm and dry in no acute distress. Patient denies SI, HI, and AVH. Pt. Encouraged to let me know if needs arise. 

## 2018-01-20 NOTE — ED Notes (Signed)
Called group home and there was no answer.  Called group home owner, Mrs. Wallington, and left a voicemail.

## 2018-01-20 NOTE — ED Notes (Signed)
Pt denies SI, HI, and A/V hallucinations.  Verbalizes understanding of discharge instructions.  Discharged to group home with group home owner.

## 2018-01-20 NOTE — ED Notes (Signed)
Hourly rounding reveals patient sleeping in room. No complaints, stable, in no acute distress. Q15 minute rounds and monitoring via Security Cameras to continue. 

## 2018-01-25 DIAGNOSIS — E113213 Type 2 diabetes mellitus with mild nonproliferative diabetic retinopathy with macular edema, bilateral: Secondary | ICD-10-CM | POA: Diagnosis not present

## 2018-02-08 DIAGNOSIS — F419 Anxiety disorder, unspecified: Secondary | ICD-10-CM | POA: Diagnosis not present

## 2018-02-08 DIAGNOSIS — F209 Schizophrenia, unspecified: Secondary | ICD-10-CM | POA: Diagnosis not present

## 2018-02-21 ENCOUNTER — Emergency Department
Admission: EM | Admit: 2018-02-21 | Discharge: 2018-02-21 | Disposition: A | Discharge status: Home / Self Care | Payer: Medicare Other | Attending: Emergency Medicine | Admitting: Emergency Medicine

## 2018-02-21 ENCOUNTER — Other Ambulatory Visit: Payer: Self-pay

## 2018-02-21 DIAGNOSIS — R451 Restlessness and agitation: Secondary | ICD-10-CM

## 2018-02-21 DIAGNOSIS — Z79899 Other long term (current) drug therapy: Secondary | ICD-10-CM | POA: Diagnosis not present

## 2018-02-21 DIAGNOSIS — Z794 Long term (current) use of insulin: Secondary | ICD-10-CM | POA: Insufficient documentation

## 2018-02-21 DIAGNOSIS — F209 Schizophrenia, unspecified: Secondary | ICD-10-CM | POA: Diagnosis not present

## 2018-02-21 DIAGNOSIS — Z87891 Personal history of nicotine dependence: Secondary | ICD-10-CM | POA: Diagnosis not present

## 2018-02-21 DIAGNOSIS — R45851 Suicidal ideations: Secondary | ICD-10-CM | POA: Diagnosis not present

## 2018-02-21 DIAGNOSIS — F79 Unspecified intellectual disabilities: Secondary | ICD-10-CM | POA: Diagnosis present

## 2018-02-21 DIAGNOSIS — E119 Type 2 diabetes mellitus without complications: Secondary | ICD-10-CM | POA: Insufficient documentation

## 2018-02-21 DIAGNOSIS — F29 Unspecified psychosis not due to a substance or known physiological condition: Secondary | ICD-10-CM | POA: Diagnosis present

## 2018-02-21 DIAGNOSIS — I10 Essential (primary) hypertension: Secondary | ICD-10-CM | POA: Diagnosis not present

## 2018-02-21 DIAGNOSIS — F200 Paranoid schizophrenia: Secondary | ICD-10-CM | POA: Diagnosis not present

## 2018-02-21 DIAGNOSIS — F70 Mild intellectual disabilities: Secondary | ICD-10-CM | POA: Diagnosis not present

## 2018-02-21 LAB — URINE DRUG SCREEN, QUALITATIVE (ARMC ONLY)
Amphetamines, Ur Screen: NOT DETECTED
BARBITURATES, UR SCREEN: NOT DETECTED
BENZODIAZEPINE, UR SCRN: NOT DETECTED
Cannabinoid 50 Ng, Ur ~~LOC~~: NOT DETECTED
Cocaine Metabolite,Ur ~~LOC~~: NOT DETECTED
MDMA (Ecstasy)Ur Screen: NOT DETECTED
Methadone Scn, Ur: NOT DETECTED
Opiate, Ur Screen: NOT DETECTED
Phencyclidine (PCP) Ur S: NOT DETECTED
Tricyclic, Ur Screen: NOT DETECTED

## 2018-02-21 LAB — COMPREHENSIVE METABOLIC PANEL
ALT: 21 U/L (ref 0–44)
AST: 24 U/L (ref 15–41)
Albumin: 4.2 g/dL (ref 3.5–5.0)
Alkaline Phosphatase: 68 U/L (ref 38–126)
Anion gap: 7 (ref 5–15)
BUN: 13 mg/dL (ref 6–20)
CO2: 31 mmol/L (ref 22–32)
Calcium: 8.1 mg/dL — ABNORMAL LOW (ref 8.9–10.3)
Chloride: 101 mmol/L (ref 98–111)
Creatinine, Ser: 0.62 mg/dL (ref 0.44–1.00)
GFR calc Af Amer: >60 mL/min (ref >60)
GFR calc non Af Amer: >60 mL/min (ref >60)
Glucose, Bld: 101 mg/dL — ABNORMAL HIGH (ref 70–99)
Potassium: 4.3 mmol/L (ref 3.5–5.1)
Sodium: 139 mmol/L (ref 135–145)
Total Bilirubin: 0.5 mg/dL (ref 0.3–1.2)
Total Protein: 7.8 g/dL (ref 6.5–8.1)

## 2018-02-21 LAB — URINALYSIS, COMPLETE (UACMP) WITH MICROSCOPIC
BILIRUBIN URINE: NEGATIVE
Bacteria, UA: NONE SEEN
Glucose, UA: NEGATIVE mg/dL
HGB URINE DIPSTICK: NEGATIVE
KETONES UR: NEGATIVE mg/dL
Nitrite: NEGATIVE
Protein, ur: NEGATIVE mg/dL
Specific Gravity, Urine: 1.015 (ref 1.005–1.030)
pH: 6 (ref 5.0–8.0)

## 2018-02-21 LAB — CBC WITH DIFFERENTIAL/PLATELET
ABS IMMATURE GRANULOCYTES: 0.02 10*3/uL (ref 0.00–0.07)
Basophils Absolute: 0 10*3/uL (ref 0.0–0.1)
Basophils Relative: 0 %
Eosinophils Absolute: 0.4 10*3/uL (ref 0.0–0.5)
Eosinophils Relative: 7 %
HCT: 37.8 % (ref 36.0–46.0)
Hemoglobin: 12.4 g/dL (ref 12.0–15.0)
Immature Granulocytes: 0 %
Lymphocytes Relative: 38 %
Lymphs Abs: 2.1 10*3/uL (ref 0.7–4.0)
MCH: 30.5 pg (ref 26.0–34.0)
MCHC: 32.8 g/dL (ref 30.0–36.0)
MCV: 93.1 fL (ref 80.0–100.0)
Monocytes Absolute: 0.5 10*3/uL (ref 0.1–1.0)
Monocytes Relative: 9 %
NEUTROS ABS: 2.5 10*3/uL (ref 1.7–7.7)
Neutrophils Relative %: 46 %
Platelets: 82 10*3/uL — ABNORMAL LOW (ref 150–400)
RBC: 4.06 MIL/uL (ref 3.87–5.11)
RDW: 12.9 % (ref 11.5–15.5)
WBC: 5.5 10*3/uL (ref 4.0–10.5)
nRBC: 0 % (ref 0.0–0.2)

## 2018-02-21 LAB — ACETAMINOPHEN LEVEL: Acetaminophen (Tylenol), Serum: <10 ug/mL — ABNORMAL LOW (ref 10–30)

## 2018-02-21 LAB — SALICYLATE LEVEL: Salicylate Lvl: <7 mg/dL (ref 2.8–30.0)

## 2018-02-21 LAB — ETHANOL: Alcohol, Ethyl (B): <10 mg/dL (ref <10)

## 2018-02-21 MED ORDER — METFORMIN HCL 500 MG PO TABS
500.0000 mg | ORAL_TABLET | Freq: Two times a day (BID) | ORAL | Status: DC
  Administered 2018-02-21: 500 mg via ORAL
  Filled 2018-02-21: qty 1

## 2018-02-21 MED ORDER — INSULIN DETEMIR 100 UNIT/ML ~~LOC~~ SOLN
15.0000 [IU] | Freq: Two times a day (BID) | SUBCUTANEOUS | Status: DC
  Administered 2018-02-21: 15 [IU] via SUBCUTANEOUS
  Filled 2018-02-21 (×2): qty 0.15

## 2018-02-21 MED ORDER — LISINOPRIL 10 MG PO TABS
10.0000 mg | ORAL_TABLET | Freq: Every day | ORAL | Status: DC
  Administered 2018-02-21: 10 mg via ORAL
  Filled 2018-02-21: qty 1

## 2018-02-21 MED ORDER — DULOXETINE HCL 60 MG PO CPEP
60.0000 mg | ORAL_CAPSULE | Freq: Every day | ORAL | Status: DC
  Administered 2018-02-21: 60 mg via ORAL
  Filled 2018-02-21: qty 1

## 2018-02-21 MED ORDER — LAMOTRIGINE 100 MG PO TABS: 100.0000 mg | ORAL_TABLET | Freq: Every day | ORAL | Status: DC

## 2018-02-21 MED ORDER — ATORVASTATIN CALCIUM 20 MG PO TABS
10.0000 mg | ORAL_TABLET | Freq: Every day | ORAL | Status: DC
  Administered 2018-02-21: 10 mg via ORAL
  Filled 2018-02-21: qty 1

## 2018-02-21 MED ORDER — CARVEDILOL 6.25 MG PO TABS
6.2500 mg | ORAL_TABLET | Freq: Two times a day (BID) | ORAL | Status: DC
  Administered 2018-02-21: 6.25 mg via ORAL
  Filled 2018-02-21: qty 1

## 2018-02-21 MED ORDER — LEVOTHYROXINE SODIUM 50 MCG PO TABS
150.0000 ug | ORAL_TABLET | Freq: Every day | ORAL | Status: DC
  Administered 2018-02-21: 150 ug via ORAL
  Filled 2018-02-21: qty 3

## 2018-02-21 MED ORDER — PANTOPRAZOLE SODIUM 40 MG PO TBEC
40.0000 mg | DELAYED_RELEASE_TABLET | Freq: Every day | ORAL | Status: DC
  Administered 2018-02-21: 40 mg via ORAL
  Filled 2018-02-21: qty 1

## 2018-02-21 NOTE — ED Notes (Signed)
Patient discharged back to group home with Jessica [name], patient received discharge papers. Patient received belongings and verbalized she has received all of her belongings. Patient appropriate and cooperative, Denies SI/HI AVH. Vital signs taken. NAD noted.

## 2018-02-21 NOTE — ED Notes (Signed)
Attempted to call Arizona Constable at [facility] at [phone] and left a HIPPA compliant message to call us about patient.
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
## 2018-02-21 NOTE — ED Triage Notes (Addendum)
Patient to ED via [facility] for involuntary commitment. Patient is calm and cooperative at this point.

## 2018-02-21 NOTE — ED Notes (Signed)
Patient signed a discharge paper and it was placed in the box for medical records
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
## 2018-02-21 NOTE — ED Provider Notes (Signed)
The patient has been evaluated at bedside by psychiatry.  Patient is clinically stable.  Not felt to be a danger to self or others.  No SI or Hi.  No indication for inpatient psychiatric admission at this time.  Appropriate for continued outpatient therapy.    Merlyn Lot, MD 02/21/18 1057

## 2018-02-21 NOTE — ED Notes (Signed)
Hourly rounding reveals patient sleeping in room. No complaints, stable, in no acute distress. Q15 minute rounds and monitoring via Security to continue. 

## 2018-02-24 ENCOUNTER — Encounter: Payer: Self-pay | Admitting: Emergency Medicine

## 2018-02-24 ENCOUNTER — Emergency Department
Admission: EM | Admit: 2018-02-24 | Discharge: 2018-02-25 | Disposition: A | Discharge status: Home / Self Care | Payer: Medicare Other | Attending: Emergency Medicine | Admitting: Emergency Medicine

## 2018-02-24 DIAGNOSIS — F203 Undifferentiated schizophrenia: Secondary | ICD-10-CM | POA: Insufficient documentation

## 2018-02-24 DIAGNOSIS — E119 Type 2 diabetes mellitus without complications: Secondary | ICD-10-CM | POA: Insufficient documentation

## 2018-02-24 DIAGNOSIS — Z794 Long term (current) use of insulin: Secondary | ICD-10-CM | POA: Diagnosis not present

## 2018-02-24 DIAGNOSIS — Y92198 Other place in other specified residential institution as the place of occurrence of the external cause: Secondary | ICD-10-CM | POA: Diagnosis not present

## 2018-02-24 DIAGNOSIS — Z23 Encounter for immunization: Secondary | ICD-10-CM | POA: Diagnosis not present

## 2018-02-24 DIAGNOSIS — Y999 Unspecified external cause status: Secondary | ICD-10-CM | POA: Diagnosis not present

## 2018-02-24 DIAGNOSIS — Z79899 Other long term (current) drug therapy: Secondary | ICD-10-CM | POA: Diagnosis not present

## 2018-02-24 DIAGNOSIS — R45851 Suicidal ideations: Secondary | ICD-10-CM | POA: Diagnosis not present

## 2018-02-24 DIAGNOSIS — T148XXA Other injury of unspecified body region, initial encounter: Secondary | ICD-10-CM

## 2018-02-24 DIAGNOSIS — Y9389 Activity, other specified: Secondary | ICD-10-CM | POA: Insufficient documentation

## 2018-02-24 DIAGNOSIS — R451 Restlessness and agitation: Secondary | ICD-10-CM | POA: Insufficient documentation

## 2018-02-24 DIAGNOSIS — W500XXA Accidental hit or strike by another person, initial encounter: Secondary | ICD-10-CM | POA: Insufficient documentation

## 2018-02-24 DIAGNOSIS — I10 Essential (primary) hypertension: Secondary | ICD-10-CM | POA: Diagnosis not present

## 2018-02-24 DIAGNOSIS — S0031XA Abrasion of nose, initial encounter: Secondary | ICD-10-CM | POA: Diagnosis not present

## 2018-02-24 DIAGNOSIS — F4325 Adjustment disorder with mixed disturbance of emotions and conduct: Secondary | ICD-10-CM | POA: Diagnosis not present

## 2018-02-24 DIAGNOSIS — R45850 Homicidal ideations: Secondary | ICD-10-CM

## 2018-02-24 DIAGNOSIS — R4689 Other symptoms and signs involving appearance and behavior: Secondary | ICD-10-CM

## 2018-02-24 DIAGNOSIS — R456 Violent behavior: Secondary | ICD-10-CM | POA: Diagnosis not present

## 2018-02-24 LAB — COMPREHENSIVE METABOLIC PANEL
ALT: 22 U/L (ref 0–44)
AST: 28 U/L (ref 15–41)
Albumin: 4.4 g/dL (ref 3.5–5.0)
Alkaline Phosphatase: 69 U/L (ref 38–126)
Anion gap: 9 (ref 5–15)
BUN: 12 mg/dL (ref 6–20)
CALCIUM: 8.3 mg/dL — AB (ref 8.9–10.3)
CO2: 31 mmol/L (ref 22–32)
Chloride: 96 mmol/L — ABNORMAL LOW (ref 98–111)
Creatinine, Ser: 0.56 mg/dL (ref 0.44–1.00)
GFR calc Af Amer: >60 mL/min (ref >60)
Glucose, Bld: 216 mg/dL — ABNORMAL HIGH (ref 70–99)
Potassium: 4.3 mmol/L (ref 3.5–5.1)
Sodium: 136 mmol/L (ref 135–145)
Total Bilirubin: 0.7 mg/dL (ref 0.3–1.2)
Total Protein: 8.3 g/dL — ABNORMAL HIGH (ref 6.5–8.1)

## 2018-02-24 LAB — CBC
HCT: 41 % (ref 36.0–46.0)
Hemoglobin: 13.5 g/dL (ref 12.0–15.0)
MCH: 30.8 pg (ref 26.0–34.0)
MCHC: 32.9 g/dL (ref 30.0–36.0)
MCV: 93.6 fL (ref 80.0–100.0)
Platelets: 91 10*3/uL — ABNORMAL LOW (ref 150–400)
RBC: 4.38 MIL/uL (ref 3.87–5.11)
RDW: 13.1 % (ref 11.5–15.5)
WBC: 6.1 10*3/uL (ref 4.0–10.5)
nRBC: 0 % (ref 0.0–0.2)

## 2018-02-24 LAB — URINE DRUG SCREEN, QUALITATIVE (ARMC ONLY)
Amphetamines, Ur Screen: NOT DETECTED
Barbiturates, Ur Screen: NOT DETECTED
Benzodiazepine, Ur Scrn: NOT DETECTED
CANNABINOID 50 NG, UR ~~LOC~~: NOT DETECTED
Cocaine Metabolite,Ur ~~LOC~~: NOT DETECTED
MDMA (Ecstasy)Ur Screen: NOT DETECTED
Methadone Scn, Ur: NOT DETECTED
Opiate, Ur Screen: NOT DETECTED
Phencyclidine (PCP) Ur S: NOT DETECTED
Tricyclic, Ur Screen: NOT DETECTED

## 2018-02-24 LAB — ACETAMINOPHEN LEVEL

## 2018-02-24 LAB — SALICYLATE LEVEL: Salicylate Lvl: <7 mg/dL (ref 2.8–30.0)

## 2018-02-24 LAB — ETHANOL: Alcohol, Ethyl (B): <10 mg/dL (ref <10)

## 2018-02-24 MED ORDER — LAMOTRIGINE 100 MG PO TABS
100.0000 mg | ORAL_TABLET | Freq: Every day | ORAL | Status: DC
  Administered 2018-02-24 – 2018-02-25 (×2): 100 mg via ORAL
  Filled 2018-02-24 (×2): qty 1

## 2018-02-24 MED ORDER — GABAPENTIN 300 MG PO CAPS
300.0000 mg | ORAL_CAPSULE | Freq: Two times a day (BID) | ORAL | Status: DC
  Administered 2018-02-24 – 2018-02-25 (×2): 300 mg via ORAL
  Filled 2018-02-24 (×2): qty 1

## 2018-02-24 MED ORDER — DULOXETINE HCL 30 MG PO CPEP
30.0000 mg | ORAL_CAPSULE | Freq: Every day | ORAL | Status: DC
  Administered 2018-02-25: 30 mg via ORAL
  Filled 2018-02-24: qty 1

## 2018-02-24 MED ORDER — TETANUS-DIPHTH-ACELL PERTUSSIS 5-2.5-18.5 LF-MCG/0.5 IM SUSP
0.5000 mL | Freq: Once | INTRAMUSCULAR | Status: AC
  Administered 2018-02-24: 0.5 mL via INTRAMUSCULAR
  Filled 2018-02-24: qty 0.5

## 2018-02-24 MED ORDER — ZIPRASIDONE HCL 20 MG PO CAPS
20.0000 mg | ORAL_CAPSULE | Freq: Two times a day (BID) | ORAL | Status: DC
  Administered 2018-02-24 – 2018-02-25 (×2): 20 mg via ORAL
  Filled 2018-02-24 (×2): qty 1

## 2018-02-24 MED ORDER — TRAZODONE HCL 100 MG PO TABS
100.0000 mg | ORAL_TABLET | Freq: Every day | ORAL | Status: DC
  Administered 2018-02-24: 100 mg via ORAL
  Filled 2018-02-24: qty 1

## 2018-02-24 NOTE — ED Notes (Signed)
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
## 2018-02-24 NOTE — ED Notes (Signed)
Hourly rounding reveals patient sleeping in room. No complaints, stable, in no acute distress. Q15 minute rounds and monitoring via Security Cameras to continue. 

## 2018-02-24 NOTE — ED Notes (Signed)
Report to include Situation, Background, Assessment, and Recommendations received from Rhea RN. Patient alert and oriented, warm and dry, in no acute distress. Patient denies SI, HI, AVH and pain. Patient made aware of Q15 minute rounds and security cameras for their safety. Patient instructed to come to me with needs or concerns. 

## 2018-02-24 NOTE — ED Notes (Addendum)
Patient undressed, removed black underwear, black pants, gray high top sneakers, blue socks, navy shirt, white bra, and purple jacket. Dressed out in nylon underwear, yellow non-skid socks, and maroon scrubs.

## 2018-02-24 NOTE — ED Notes (Signed)
Pt arrived to the [facility] stating, "I'm not taking my medicine and I'm not talking right now!"  Pt placed in room 7 so that she doesn't disturb other patients on the unit.
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
## 2018-02-24 NOTE — ED Notes (Signed)
Pt given lunch tray.  Performed hand hygiene.
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
## 2018-02-24 NOTE — ED Notes (Signed)
Pt started banging on nurses station door.  Stating that her arm needs to be x-rayed.  Stated that she can barely move it.  Dr. Leverne Humbles made aware.  New med orders received.
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
## 2018-02-25 DIAGNOSIS — R451 Restlessness and agitation: Secondary | ICD-10-CM | POA: Diagnosis not present

## 2018-02-25 MED ORDER — ACETAMINOPHEN 325 MG PO TABS
650.0000 mg | ORAL_TABLET | Freq: Once | ORAL | Status: AC
  Administered 2018-02-25: 650 mg via ORAL
  Filled 2018-02-25: qty 2

## 2018-02-25 NOTE — ED Notes (Signed)
RN contacted [facility] of group home [phone]).  Ms. Hughie Closs made aware of patient's discharge from ED and stated, "I will call the owner."

## 2018-02-25 NOTE — ED Notes (Signed)
Hourly rounding reveals patient sleeping in room. No complaints, stable, in no acute distress. Q15 minute rounds and monitoring via Security Cameras to continue. 

## 2018-02-25 NOTE — ED Notes (Signed)
Patient states she is "seein things".

## 2018-02-25 NOTE — ED Notes (Signed)
Pt to be re-assed by [facility].

## 2018-02-25 NOTE — ED Provider Notes (Signed)
Vitals:   02/24/18 0914 02/24/18 2100  BP: (!) 159/75 135/71  Pulse: 73 80  Resp: 20 16  Temp: 98.5 F (36.9 C) 98.1 F (36.7 C)  SpO2: 96% 98%   Patient remains medically clear for psychiatric evaluation and disposition   Earleen Newport, MD 02/25/18 [phone]

## 2018-02-25 NOTE — ED Provider Notes (Signed)
Patient has been cleared by psychiatry for discharge.   Earleen Newport, MD 02/25/18 1115

## 2018-02-25 NOTE — BH Assessment (Signed)
Writer spoke with patient to complete reassessment, patient denies SI/HI and AV/H. She states she's ready to go back to the [facility].

## 2018-02-25 NOTE — ED Notes (Signed)
Pt discharged back to group home in care of Phillis Haggis (group home owner).  VS stable. All belongings returned to patient. Pt denies SI/HI. Discharge instructions reviewed with patient. Patient signed hard copy of discharge instructions.

## 2018-02-26 ENCOUNTER — Other Ambulatory Visit: Payer: Self-pay

## 2018-02-26 ENCOUNTER — Emergency Department
Admission: EM | Admit: 2018-02-26 | Discharge: 2018-02-27 | Disposition: A | Discharge status: Home / Self Care | Payer: Medicare Other | Attending: Emergency Medicine | Admitting: Emergency Medicine

## 2018-02-26 DIAGNOSIS — F1210 Cannabis abuse, uncomplicated: Secondary | ICD-10-CM | POA: Diagnosis not present

## 2018-02-26 DIAGNOSIS — Z87891 Personal history of nicotine dependence: Secondary | ICD-10-CM | POA: Diagnosis not present

## 2018-02-26 DIAGNOSIS — F203 Undifferentiated schizophrenia: Secondary | ICD-10-CM | POA: Diagnosis not present

## 2018-02-26 DIAGNOSIS — Z046 Encounter for general psychiatric examination, requested by authority: Secondary | ICD-10-CM | POA: Diagnosis present

## 2018-02-26 DIAGNOSIS — R45850 Homicidal ideations: Secondary | ICD-10-CM | POA: Insufficient documentation

## 2018-02-26 DIAGNOSIS — I10 Essential (primary) hypertension: Secondary | ICD-10-CM | POA: Diagnosis not present

## 2018-02-26 DIAGNOSIS — Z794 Long term (current) use of insulin: Secondary | ICD-10-CM | POA: Insufficient documentation

## 2018-02-26 DIAGNOSIS — F209 Schizophrenia, unspecified: Secondary | ICD-10-CM | POA: Diagnosis not present

## 2018-02-26 DIAGNOSIS — Z79899 Other long term (current) drug therapy: Secondary | ICD-10-CM | POA: Diagnosis not present

## 2018-02-26 DIAGNOSIS — F1410 Cocaine abuse, uncomplicated: Secondary | ICD-10-CM | POA: Insufficient documentation

## 2018-02-26 DIAGNOSIS — E119 Type 2 diabetes mellitus without complications: Secondary | ICD-10-CM | POA: Diagnosis not present

## 2018-02-26 DIAGNOSIS — R45851 Suicidal ideations: Secondary | ICD-10-CM | POA: Diagnosis not present

## 2018-02-26 LAB — COMPREHENSIVE METABOLIC PANEL
ALT: 23 U/L (ref 0–44)
AST: 25 U/L (ref 15–41)
Albumin: 4.2 g/dL (ref 3.5–5.0)
Alkaline Phosphatase: 74 U/L (ref 38–126)
Anion gap: 8 (ref 5–15)
BUN: 12 mg/dL (ref 6–20)
CO2: 30 mmol/L (ref 22–32)
Calcium: 8.3 mg/dL — ABNORMAL LOW (ref 8.9–10.3)
Chloride: 101 mmol/L (ref 98–111)
Creatinine, Ser: 0.58 mg/dL (ref 0.44–1.00)
GFR calc Af Amer: >60 mL/min (ref >60)
GFR calc non Af Amer: >60 mL/min (ref >60)
GLUCOSE: 168 mg/dL — AB (ref 70–99)
Potassium: 3.7 mmol/L (ref 3.5–5.1)
Sodium: 139 mmol/L (ref 135–145)
Total Bilirubin: 0.6 mg/dL (ref 0.3–1.2)
Total Protein: 7.8 g/dL (ref 6.5–8.1)

## 2018-02-26 LAB — CBC
HCT: 38.4 % (ref 36.0–46.0)
Hemoglobin: 12.6 g/dL (ref 12.0–15.0)
MCH: 30.7 pg (ref 26.0–34.0)
MCHC: 32.8 g/dL (ref 30.0–36.0)
MCV: 93.4 fL (ref 80.0–100.0)
NRBC: 0 % (ref 0.0–0.2)
Platelets: 88 10*3/uL — ABNORMAL LOW (ref 150–400)
RBC: 4.11 MIL/uL (ref 3.87–5.11)
RDW: 13.1 % (ref 11.5–15.5)
WBC: 5.9 10*3/uL (ref 4.0–10.5)

## 2018-02-26 LAB — URINE DRUG SCREEN, QUALITATIVE (ARMC ONLY)
AMPHETAMINES, UR SCREEN: NOT DETECTED
BENZODIAZEPINE, UR SCRN: NOT DETECTED
Barbiturates, Ur Screen: NOT DETECTED
Cannabinoid 50 Ng, Ur ~~LOC~~: NOT DETECTED
Cocaine Metabolite,Ur ~~LOC~~: NOT DETECTED
MDMA (Ecstasy)Ur Screen: NOT DETECTED
Methadone Scn, Ur: NOT DETECTED
Opiate, Ur Screen: NOT DETECTED
Phencyclidine (PCP) Ur S: NOT DETECTED
Tricyclic, Ur Screen: NOT DETECTED

## 2018-02-26 LAB — GLUCOSE, CAPILLARY: Glucose-Capillary: 146 mg/dL — ABNORMAL HIGH (ref 70–99)

## 2018-02-26 LAB — ACETAMINOPHEN LEVEL: Acetaminophen (Tylenol), Serum: <10 ug/mL — ABNORMAL LOW (ref 10–30)

## 2018-02-26 LAB — ETHANOL: Alcohol, Ethyl (B): <10 mg/dL (ref <10)

## 2018-02-26 LAB — SALICYLATE LEVEL: Salicylate Lvl: <7 mg/dL (ref 2.8–30.0)

## 2018-02-26 MED ORDER — INSULIN ASPART 100 UNIT/ML ~~LOC~~ SOLN
0.0000 [IU] | Freq: Three times a day (TID) | SUBCUTANEOUS | Status: DC
  Administered 2018-02-27: 4 [IU] via SUBCUTANEOUS
  Filled 2018-02-26: qty 1

## 2018-02-26 MED ORDER — INSULIN ASPART 100 UNIT/ML ~~LOC~~ SOLN: 0.0000 [IU] | Freq: Every day | SUBCUTANEOUS | Status: DC

## 2018-02-26 NOTE — ED Triage Notes (Addendum)
Patient states she is suicidal and homicidal.  Patient reports auditory and visual hallucinations.  Patient is voluntary.

## 2018-02-26 NOTE — ED Notes (Signed)
Belongings placed in labeled belongings bag: 1 red colored sweat shirt, 1 pair jeans, 1 pair multi colored socks, 1 pair black colored tennis shoes with pink colored laces, 1 gray colored bra,1 pink colored panties, 1 black colored coat.

## 2018-02-26 NOTE — ED Provider Notes (Signed)
[facility] Emergency Department Provider Note  ____________________________________________  Time seen: Approximately 10:32 PM  I have reviewed the triage vital signs and the nursing notes.   HISTORY  Chief Complaint Suicidal   HPI Jessica [name] is a 53 y.o. female with a history of schizophrenia, depression, anxiety, diabetes, and hypertension who presents voluntarily for suicidal homicidal ideation.  Patient was discharged from [place] yesterday.  She reports that the staff at the group home were mean to her.  She has been having thoughts of killing them.  No one in specific.  She was also having thoughts of killing herself.  She has no plan.  She endorses compliance with her medications.  She denies any medical complaints at this time.  No alcohol or drug use. Past Medical History:  Diagnosis Date  . Anxiety   . Depression   . Diabetes mellitus without complication ([place])   . GERD (gastroesophageal reflux disease)   . Hypertension     Patient Active Problem List   Diagnosis Date Noted  . Agitation 02/21/2018  . Undifferentiated schizophrenia ([place]) 12/29/2017  . Mild intellectual disability 12/27/2017  . Diabetes mellitus without complication ([place]) 07/37/1062  . Adjustment disorder with mixed disturbance of emotions and conduct 07/21/2017  . Schizophrenia ([place]) 01/02/2017    No past surgical history on file.  Prior to Admission medications   Medication Sig Start Date End Date Taking? Authorizing Provider  acetaminophen (TYLENOL) 325 MG tablet Take 2 tablets (650 mg total) by mouth every 6 (six) hours as needed for mild pain or headache. Patient not taking: Reported on 01/20/2018 01/15/17   Lindell Spar I, NP  atorvastatin (LIPITOR) 10 MG tablet Take 10 mg by mouth daily.    [provider]  carvedilol (COREG) 6.25 MG tablet Take 1 tablet (6.25 mg total) by mouth 2 (two) times daily. For high blood pressure 01/15/17   Nwoko, Herbert Pun I, NP    cetirizine (ZYRTEC) 10 MG tablet Take 10 mg by mouth daily.    [provider]  DULoxetine (CYMBALTA) 60 MG capsule Take 1 capsule (60 mg total) by mouth daily. For depression 01/16/17   Lindell Spar I, NP  ferrous sulfate (FEROSUL) 325 (65 FE) MG tablet Take 1 tablet (325 mg total) by mouth 2 (two) times daily. For anemia 01/15/17   Lindell Spar I, NP  gabapentin (NEURONTIN) 600 MG tablet Take 1 tablet (600 mg total) by mouth 3 (three) times daily. For agitation Patient not taking: Reported on 07/09/2017 01/15/17   Lindell Spar I, NP  hydrOXYzine (ATARAX/VISTARIL) 25 MG tablet Take 1 tablet (25 mg total) by mouth 3 (three) times daily as needed for anxiety. Patient not taking: Reported on 07/09/2017 01/15/17   Lindell Spar I, NP  insulin detemir (LEVEMIR) 100 UNIT/ML injection Inject 0.15 mLs (15 Units total) into the skin 2 (two) times daily. 01/02/18   Pucilowska, Herma Ard B, MD  lamoTRIgine (LAMICTAL) 100 MG tablet Take 1 tablet (100 mg total) by mouth 2 (two) times daily. Patient taking differently: Take 100 mg by mouth at bedtime.  01/02/18   Pucilowska, Jolanta B, MD  levothyroxine (SYNTHROID, LEVOTHROID) 150 MCG tablet Take 1 tablet (150 mcg total) by mouth daily. For thyroid function hormone replacement 01/15/17   Lindell Spar I, NP  lisinopril (PRINIVIL,ZESTRIL) 10 MG tablet Take 1 tablet (10 mg total) by mouth daily. For high blood pressure 01/16/17   Nwoko, Herbert Pun I, NP  loratadine (CLARITIN) 10 MG tablet Take 1 tablet (10  mg total) by mouth daily. For allergies Patient not taking: Reported on 01/20/2018 01/16/17   Lindell Spar I, NP  lurasidone (LATUDA) 80 MG TABS tablet Take 80 mg by mouth at bedtime.    [provider]  lurasidone 120 MG TABS Take 1 tablet (120 mg total) by mouth daily with breakfast. Patient not taking: Reported on 01/20/2018 01/03/18   Pucilowska, Herma Ard B, MD  metFORMIN (GLUCOPHAGE) 500 MG tablet Take 1 tablet (500 mg total) by mouth 2 (two) times  daily. For diabetes management 01/15/17   Lindell Spar I, NP  naproxen (NAPROSYN) 500 MG tablet Take 1 tablet (500 mg total) by mouth 2 (two) times daily with a meal. Patient not taking: Reported on 01/20/2018 07/10/17   Lavonia Drafts, MD  Oyster Shell 500 MG TABS Take 1 tablet by mouth daily.    [provider]  pantoprazole (PROTONIX) 40 MG tablet Take 1 tablet (40 mg total) by mouth daily. For acid reflux 01/16/17   Lindell Spar I, NP  traZODone (DESYREL) 100 MG tablet Take 1 tablet (100 mg total) by mouth at bedtime as needed for sleep. 01/15/17   Encarnacion Slates, NP    Allergies Abilify [aripiprazole]; Haldol [haloperidol]; and Risperdal [risperidone]  No family history on file.  Social History Social History   Tobacco Use  . Smoking status: Former Research scientist (life sciences)  . Smokeless tobacco: Never Used  Substance Use Topics  . Alcohol use: No    Frequency: Never  . Drug use: Yes    Types: Marijuana, "Crack" cocaine    Comment: reports she has not done anything in a long time    Review of Systems  Constitutional: Negative for fever. Eyes: Negative for visual changes. ENT: Negative for sore throat. Neck: No neck pain  Cardiovascular: Negative for chest pain. Respiratory: Negative for shortness of breath. Gastrointestinal: Negative for abdominal pain, vomiting or diarrhea. Genitourinary: Negative for dysuria. Musculoskeletal: Negative for back pain. Skin: Negative for rash. Neurological: Negative for headaches, weakness or numbness. Psych: + SI and HI  ____________________________________________   PHYSICAL EXAM:  VITAL SIGNS: ED Triage Vitals  Enc Vitals Group     BP 02/26/18 2200 (!) 131/58     Pulse Rate 02/26/18 2200 93     Resp 02/26/18 2200 18     Temp 02/26/18 2200 98.3 F (36.8 C)     Temp Source 02/26/18 2200 Oral     SpO2 02/26/18 2200 96 %     Weight 02/26/18 2201 155 lb (70.3 kg)     Height --      Head Circumference --      Peak Flow --      Pain  Score 02/26/18 2201 0     Pain Loc --      Pain Edu? --      Excl. in [place]? --     Constitutional: Alert and oriented. Well appearing and in no apparent distress. HEENT:      Head: Normocephalic and atraumatic.         Eyes: Conjunctivae are normal. Sclera is non-icteric.       Mouth/Throat: Mucous membranes are moist.       Neck: Supple with no signs of meningismus. Cardiovascular: Regular rate and rhythm. No murmurs, gallops, or rubs. 2+ symmetrical distal pulses are present in all extremities. No JVD. Respiratory: Normal respiratory effort. Lungs are clear to auscultation bilaterally. No wheezes, crackles, or rhonchi.  Gastrointestinal: Soft, non tender, and non distended with positive bowel sounds.  No rebound or guarding. Musculoskeletal: Nontender with normal range of motion in all extremities. No edema, cyanosis, or erythema of extremities. Neurologic: Normal speech and language. Face is symmetric. Moving all extremities. No gross focal neurologic deficits are appreciated. Skin: Skin is warm, dry and intact. No rash noted. Psychiatric: Mood and affect are normal. Speech and behavior are normal.  ____________________________________________   LABS (all labs ordered are listed, but only abnormal results are displayed)  Labs Reviewed  COMPREHENSIVE METABOLIC PANEL - Abnormal; Notable for the following components:      Result Value   Glucose, Bld 168 (*)    Calcium 8.3 (*)    All other components within normal limits  ACETAMINOPHEN LEVEL - Abnormal; Notable for the following components:   Acetaminophen (Tylenol), Serum <10 (*)    All other components within normal limits  CBC - Abnormal; Notable for the following components:   Platelets 88 (*)    All other components within normal limits  ETHANOL  SALICYLATE LEVEL  URINE DRUG SCREEN, QUALITATIVE ([name] ONLY)  POC URINE PREG, ED   ____________________________________________  EKG  none    ____________________________________________  RADIOLOGY  none  ____________________________________________   PROCEDURES  Procedure(s) performed: None Procedures Critical Care performed:  None ____________________________________________   INITIAL IMPRESSION / ASSESSMENT AND PLAN / ED COURSE  53 y.o. female with a history of schizophrenia, depression, anxiety, diabetes, and hypertension who presents voluntarily for passive suicidal and homicidal ideation.  Recently discharged from our hospital after being here for several days for psychiatric stabilization yesterday.  Patient is here voluntarily however due to active suicidal homicidal ideation will take IVC papers.  Will consult psychiatry.  Labs for medical clearance are within normal limits.      As part of my medical decision making, I reviewed the following data within the [place] notes reviewed and incorporated, Labs reviewed , Old chart reviewed, A consult was requested and obtained from this/these consultant(s) Psychiatry, Notes from prior ED visits and [place] Controlled Substance Database    Pertinent labs & imaging results that were available during my care of the patient were reviewed by me and considered in my medical decision making (see chart for details).    ____________________________________________   FINAL CLINICAL IMPRESSION(S) / ED DIAGNOSES  Final diagnoses:  Suicidal ideation  Homicidal ideation      NEW MEDICATIONS STARTED DURING THIS VISIT:  ED Discharge Orders    None       Note:  This document was prepared using Dragon voice recognition software and may include unintentional dictation errors.    Alfred Levins, [place], MD 02/26/18 2236

## 2018-02-27 DIAGNOSIS — F203 Undifferentiated schizophrenia: Secondary | ICD-10-CM | POA: Diagnosis not present

## 2018-02-27 LAB — GLUCOSE, CAPILLARY: Glucose-Capillary: 175 mg/dL — ABNORMAL HIGH (ref 70–99)

## 2018-02-27 NOTE — ED Notes (Signed)
This RN spoke with Mr Berenice Primas at [facility] and requested them to come pick up the patient as she is up for discharge. Mr Berenice Primas states he has no way to pick her up. Requested for him to inform the group home owner and to have someone arrive to pick her up by 830 am. Mr Berenice Primas states he will call Ms. Amalga the owner.

## 2018-02-27 NOTE — ED Notes (Signed)
Report given to [facility] MD. Pt taken to consult room to speak with MD on computer. Officer McAdoo with patient.

## 2018-02-27 NOTE — ED Notes (Signed)
Patient discharged to group home with staff member [place], patient received discharge papers. Patient received belongings and verbalized she has received all of her belongings. Patient appropriate and cooperative, Denies SI/HI AVH. Vital signs taken. NAD noted.

## 2018-02-27 NOTE — Discharge Instructions (Addendum)
Take your medicines as directed by your doctor.  Return to the ER for worsening symptoms or other concerns.

## 2018-02-27 NOTE — ED Notes (Signed)
Pt returned to her bed. Pt stating she will not go back to her group home. Pt states if she is sent back there she will walk away again. Explained to the patient that I did not know if she was going to be discharged at this time.

## 2018-02-27 NOTE — ED Notes (Addendum)
Patient and group home staff signed the paper discharge form

## 2018-02-27 NOTE — ED Provider Notes (Signed)
-----------------------------------------   5:28 AM on 02/27/2018 -----------------------------------------  Patient was evaluated by [facility] psychiatrist Dr. Rolanda Jay who has rescinded her IVC.  Deems her psychiatrically stable for discharge back to group home.  No medication changes.   Paulette Blanch, MD 02/27/18 [phone]

## 2018-02-27 NOTE — ED Notes (Signed)
BEHAVIORAL HEALTH ROUNDING Patient sleeping: No. Patient alert and oriented: yes Behavior appropriate: Yes.  ; If no, describe:  Nutrition and fluids offered: yes Toileting and hygiene offered: Yes  Sitter present: q15 minute observations and security monitoring Law enforcement present: Yes    

## 2018-03-01 DIAGNOSIS — F209 Schizophrenia, unspecified: Secondary | ICD-10-CM | POA: Diagnosis not present

## 2018-03-01 DIAGNOSIS — F419 Anxiety disorder, unspecified: Secondary | ICD-10-CM | POA: Diagnosis not present

## 2018-03-08 DIAGNOSIS — E113213 Type 2 diabetes mellitus with mild nonproliferative diabetic retinopathy with macular edema, bilateral: Secondary | ICD-10-CM | POA: Diagnosis not present

## 2018-03-10 DIAGNOSIS — E559 Vitamin D deficiency, unspecified: Secondary | ICD-10-CM | POA: Diagnosis not present

## 2018-03-10 DIAGNOSIS — I10 Essential (primary) hypertension: Secondary | ICD-10-CM | POA: Diagnosis not present

## 2018-03-10 DIAGNOSIS — D509 Iron deficiency anemia, unspecified: Secondary | ICD-10-CM | POA: Diagnosis not present

## 2018-03-10 DIAGNOSIS — E785 Hyperlipidemia, unspecified: Secondary | ICD-10-CM | POA: Diagnosis not present

## 2018-03-10 DIAGNOSIS — K219 Gastro-esophageal reflux disease without esophagitis: Secondary | ICD-10-CM | POA: Diagnosis not present

## 2018-03-10 DIAGNOSIS — E119 Type 2 diabetes mellitus without complications: Secondary | ICD-10-CM | POA: Diagnosis not present

## 2018-03-10 DIAGNOSIS — E039 Hypothyroidism, unspecified: Secondary | ICD-10-CM | POA: Diagnosis not present

## 2018-03-14 DIAGNOSIS — F419 Anxiety disorder, unspecified: Secondary | ICD-10-CM | POA: Diagnosis not present

## 2018-03-14 DIAGNOSIS — F209 Schizophrenia, unspecified: Secondary | ICD-10-CM | POA: Diagnosis not present

## 2018-05-01 ENCOUNTER — Emergency Department
Admission: EM | Admit: 2018-05-01 | Discharge: 2018-05-01 | Disposition: A | Discharge status: Home / Self Care | Payer: Medicare Other | Attending: Emergency Medicine | Admitting: Emergency Medicine

## 2018-05-01 ENCOUNTER — Encounter: Payer: Self-pay | Admitting: Emergency Medicine

## 2018-05-01 ENCOUNTER — Other Ambulatory Visit: Payer: Self-pay

## 2018-05-01 DIAGNOSIS — I10 Essential (primary) hypertension: Secondary | ICD-10-CM | POA: Insufficient documentation

## 2018-05-01 DIAGNOSIS — E119 Type 2 diabetes mellitus without complications: Secondary | ICD-10-CM | POA: Diagnosis not present

## 2018-05-01 DIAGNOSIS — Z87891 Personal history of nicotine dependence: Secondary | ICD-10-CM | POA: Insufficient documentation

## 2018-05-01 DIAGNOSIS — Z7984 Long term (current) use of oral hypoglycemic drugs: Secondary | ICD-10-CM | POA: Diagnosis not present

## 2018-05-01 DIAGNOSIS — Z79899 Other long term (current) drug therapy: Secondary | ICD-10-CM | POA: Diagnosis not present

## 2018-05-01 DIAGNOSIS — F69 Unspecified disorder of adult personality and behavior: Secondary | ICD-10-CM

## 2018-05-01 DIAGNOSIS — F419 Anxiety disorder, unspecified: Secondary | ICD-10-CM | POA: Diagnosis present

## 2018-05-01 DIAGNOSIS — F203 Undifferentiated schizophrenia: Secondary | ICD-10-CM | POA: Diagnosis not present

## 2018-05-01 DIAGNOSIS — F70 Mild intellectual disabilities: Secondary | ICD-10-CM

## 2018-05-01 DIAGNOSIS — F99 Mental disorder, not otherwise specified: Secondary | ICD-10-CM

## 2018-05-01 LAB — URINE DRUG SCREEN, QUALITATIVE (ARMC ONLY)
Amphetamines, Ur Screen: NOT DETECTED
Barbiturates, Ur Screen: NOT DETECTED
Benzodiazepine, Ur Scrn: NOT DETECTED
Cannabinoid 50 Ng, Ur ~~LOC~~: NOT DETECTED
Cocaine Metabolite,Ur ~~LOC~~: NOT DETECTED
MDMA (Ecstasy)Ur Screen: NOT DETECTED
Methadone Scn, Ur: NOT DETECTED
Opiate, Ur Screen: NOT DETECTED
Phencyclidine (PCP) Ur S: NOT DETECTED
Tricyclic, Ur Screen: NOT DETECTED

## 2018-05-01 LAB — CBC
HCT: 39.7 % (ref 36.0–46.0)
Hemoglobin: 12.8 g/dL (ref 12.0–15.0)
MCH: 30.9 pg (ref 26.0–34.0)
MCHC: 32.2 g/dL (ref 30.0–36.0)
MCV: 95.9 fL (ref 80.0–100.0)
Platelets: 75 10*3/uL — ABNORMAL LOW (ref 150–400)
RBC: 4.14 MIL/uL (ref 3.87–5.11)
RDW: 12.5 % (ref 11.5–15.5)
WBC: 5.5 10*3/uL (ref 4.0–10.5)
nRBC: 0 % (ref 0.0–0.2)

## 2018-05-01 LAB — COMPREHENSIVE METABOLIC PANEL
ALT: 19 U/L (ref 0–44)
AST: 22 U/L (ref 15–41)
Albumin: 4.3 g/dL (ref 3.5–5.0)
Alkaline Phosphatase: 79 U/L (ref 38–126)
Anion gap: 12 (ref 5–15)
BUN: 15 mg/dL (ref 6–20)
CO2: 31 mmol/L (ref 22–32)
Calcium: 7.7 mg/dL — ABNORMAL LOW (ref 8.9–10.3)
Chloride: 98 mmol/L (ref 98–111)
Creatinine, Ser: 0.58 mg/dL (ref 0.44–1.00)
GFR calc Af Amer: >60 mL/min (ref >60)
GFR calc non Af Amer: >60 mL/min (ref >60)
Glucose, Bld: 127 mg/dL — ABNORMAL HIGH (ref 70–99)
Potassium: 3.3 mmol/L — ABNORMAL LOW (ref 3.5–5.1)
Sodium: 141 mmol/L (ref 135–145)
Total Bilirubin: 0.6 mg/dL (ref 0.3–1.2)
Total Protein: 7.6 g/dL (ref 6.5–8.1)

## 2018-05-01 LAB — SALICYLATE LEVEL: Salicylate Lvl: <7 mg/dL (ref 2.8–30.0)

## 2018-05-01 LAB — GLUCOSE, CAPILLARY: Glucose-Capillary: 98 mg/dL (ref 70–99)

## 2018-05-01 LAB — ETHANOL: Alcohol, Ethyl (B): <10 mg/dL (ref <10)

## 2018-05-01 LAB — ACETAMINOPHEN LEVEL: Acetaminophen (Tylenol), Serum: <10 ug/mL — ABNORMAL LOW (ref 10–30)

## 2018-05-01 NOTE — ED Notes (Signed)
BEHAVIORAL HEALTH ROUNDING Patient sleeping: No. Patient alert and oriented: yes Behavior appropriate: Yes.  ; If no, describe:  Nutrition and fluids offered: yes Toileting and hygiene offered: Yes  Sitter present: q15 minute observations and security  monitoring Law enforcement present: Yes  ODS  

## 2018-05-01 NOTE — ED Notes (Signed)
Pt to interview room for [facility] consult

## 2018-05-01 NOTE — ED Provider Notes (Signed)
Cleared for discharge by psychiatry   Lavonia Drafts, MD 05/01/18 1204

## 2018-05-01 NOTE — ED Notes (Signed)
Breakfast was given to patient by nurse Amy T. At this time

## 2018-05-01 NOTE — ED Notes (Signed)
She has ambulated to the BR with a steady gait
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
## 2018-05-01 NOTE — ED Notes (Signed)
Pt. Requested and was given meal tray and drink.

## 2018-05-01 NOTE — ED Notes (Signed)
ED  Is the patient under IVC or is there intent for IVC:  Voluntary   Is the patient medically cleared: Yes.   Is there vacancy in the ED BHU: Yes.   Is the population mix appropriate for patient: Yes.   Is the patient awaiting placement in inpatient or outpatient setting:   Has the patient had a psychiatric consult:  Consult pending   Survey of unit performed for contraband, proper placement and condition of furniture, tampering with fixtures in bathroom, shower, and each patient room: Yes.  ; Findings:  APPEARANCE/BEHAVIOR Calm and cooperative NEURO ASSESSMENT Orientation: oriented x3  Denies pain Hallucinations: No.None noted (Hallucinations)  Denies at this time   Speech: Normal Gait: normal RESPIRATORY ASSESSMENT Even  Unlabored respirations  CARDIOVASCULAR ASSESSMENT Pulses equal   regular rate  Skin warm and dry   GASTROINTESTINAL ASSESSMENT no GI complaint EXTREMITIES Full ROM  PLAN OF CARE Provide calm/safe environment. Vital signs assessed twice daily. ED BHU Assessment once each 12-hour shift. Collaborate with TTS when available or as condition indicates. Assure the ED provider has rounded once each shift. Provide and encourage hygiene. Provide redirection as needed. Assess for escalating behavior; address immediately and inform ED provider.  Assess family dynamic and appropriateness for visitation as needed: Yes.  ; If necessary, describe findings:  Educate the patient/family about BHU procedures/visitation: Yes.  ; If necessary, describe findings:

## 2018-05-01 NOTE — ED Notes (Signed)
Patient observed lying in hallway bed with eyes closed  Even, unlabored respirations observed   NAD pt appears to be sleeping  I will continue to monitor along with every 15 minute visual observations and ongoing security monitoring
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
## 2018-05-01 NOTE — ED Notes (Signed)

## 2018-05-01 NOTE — Discharge Instructions (Addendum)
Patient cleared by psychiatry for discharge, no adjustments in medication

## 2018-05-01 NOTE — ED Notes (Signed)
Pt changed into scrubs and belongings secured. Tennis shoes, white socks, denim shorts, black underwear, gray tshirt with [place] on it, personal items in pink purse.

## 2018-05-01 NOTE — ED Notes (Signed)
I provided her the phone so that she may call her sister  -sister is Lan[place] [phone]

## 2018-05-01 NOTE — ED Notes (Signed)
Called and spoke with [place] Constable  [phone]  She is sending group home staff to pick this pt up
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
## 2018-06-03 ENCOUNTER — Other Ambulatory Visit: Payer: Self-pay

## 2018-06-03 ENCOUNTER — Emergency Department
Admission: EM | Admit: 2018-06-03 | Discharge: 2018-06-04 | Disposition: A | Discharge status: Other Acute Inpatient Hospital | Payer: Medicare Other | Attending: Emergency Medicine | Admitting: Emergency Medicine

## 2018-06-03 ENCOUNTER — Encounter: Payer: Self-pay | Admitting: Emergency Medicine

## 2018-06-03 DIAGNOSIS — Z1159 Encounter for screening for other viral diseases: Secondary | ICD-10-CM | POA: Insufficient documentation

## 2018-06-03 DIAGNOSIS — Z87891 Personal history of nicotine dependence: Secondary | ICD-10-CM | POA: Insufficient documentation

## 2018-06-03 DIAGNOSIS — E119 Type 2 diabetes mellitus without complications: Secondary | ICD-10-CM | POA: Insufficient documentation

## 2018-06-03 DIAGNOSIS — F329 Major depressive disorder, single episode, unspecified: Secondary | ICD-10-CM | POA: Insufficient documentation

## 2018-06-03 DIAGNOSIS — I10 Essential (primary) hypertension: Secondary | ICD-10-CM | POA: Insufficient documentation

## 2018-06-03 DIAGNOSIS — R45851 Suicidal ideations: Secondary | ICD-10-CM | POA: Insufficient documentation

## 2018-06-03 DIAGNOSIS — Z79899 Other long term (current) drug therapy: Secondary | ICD-10-CM | POA: Insufficient documentation

## 2018-06-03 LAB — URINE DRUG SCREEN, QUALITATIVE (ARMC ONLY)
Amphetamines, Ur Screen: NOT DETECTED
Barbiturates, Ur Screen: NOT DETECTED
Benzodiazepine, Ur Scrn: NOT DETECTED
Cannabinoid 50 Ng, Ur ~~LOC~~: NOT DETECTED
Cocaine Metabolite,Ur ~~LOC~~: NOT DETECTED
MDMA (Ecstasy)Ur Screen: NOT DETECTED
Methadone Scn, Ur: NOT DETECTED
Opiate, Ur Screen: NOT DETECTED
Phencyclidine (PCP) Ur S: NOT DETECTED
Tricyclic, Ur Screen: NOT DETECTED

## 2018-06-03 LAB — CBC
HCT: 41.4 % (ref 36.0–46.0)
Hemoglobin: 14.1 g/dL (ref 12.0–15.0)
MCH: 31.6 pg (ref 26.0–34.0)
MCHC: 34.1 g/dL (ref 30.0–36.0)
MCV: 92.8 fL (ref 80.0–100.0)
Platelets: 101 10*3/uL — ABNORMAL LOW (ref 150–400)
RBC: 4.46 MIL/uL (ref 3.87–5.11)
RDW: 12.7 % (ref 11.5–15.5)
WBC: 7 10*3/uL (ref 4.0–10.5)
nRBC: 0 % (ref 0.0–0.2)

## 2018-06-03 LAB — ACETAMINOPHEN LEVEL: Acetaminophen (Tylenol), Serum: <10 ug/mL — ABNORMAL LOW (ref 10–30)

## 2018-06-03 LAB — COMPREHENSIVE METABOLIC PANEL
ALT: 24 U/L (ref 0–44)
AST: 27 U/L (ref 15–41)
Albumin: 4.6 g/dL (ref 3.5–5.0)
Alkaline Phosphatase: 81 U/L (ref 38–126)
Anion gap: 11 (ref 5–15)
BUN: 13 mg/dL (ref 6–20)
CO2: 30 mmol/L (ref 22–32)
Calcium: 8.4 mg/dL — ABNORMAL LOW (ref 8.9–10.3)
Chloride: 97 mmol/L — ABNORMAL LOW (ref 98–111)
Creatinine, Ser: 0.78 mg/dL (ref 0.44–1.00)
GFR calc Af Amer: >60 mL/min (ref >60)
GFR calc non Af Amer: >60 mL/min (ref >60)
Glucose, Bld: 85 mg/dL (ref 70–99)
Potassium: 3.9 mmol/L (ref 3.5–5.1)
Sodium: 138 mmol/L (ref 135–145)
Total Bilirubin: 0.6 mg/dL (ref 0.3–1.2)
Total Protein: 8.1 g/dL (ref 6.5–8.1)

## 2018-06-03 LAB — SALICYLATE LEVEL: Salicylate Lvl: <7 mg/dL (ref 2.8–30.0)

## 2018-06-03 LAB — ETHANOL: Alcohol, Ethyl (B): <10 mg/dL (ref <10)

## 2018-06-03 MED ORDER — TRAZODONE HCL 100 MG PO TABS
100.0000 mg | ORAL_TABLET | Freq: Every day | ORAL | Status: DC
  Administered 2018-06-03: 100 mg via ORAL
  Filled 2018-06-03: qty 1

## 2018-06-03 NOTE — ED Notes (Signed)
Patient resting quietly in room. No noted distress or abnormal behaviors noted. Will continue 15 minute checks and observation by security camera for safety. 
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
## 2018-06-03 NOTE — ED Notes (Signed)
Care assumed
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
## 2018-06-03 NOTE — ED Notes (Signed)
BELONGINGS: Black tennis shoes, striped socks, hanes grey underwear, jean shorts, [place] grey t shirt, grey sports bra with hooks

## 2018-06-04 ENCOUNTER — Inpatient Hospital Stay
Admission: AD | Admit: 2018-06-04 | Discharge: 2018-06-07 | DRG: 885 | Disposition: A | Discharge status: Home / Self Care | Payer: Medicare Other | Attending: Psychiatry | Admitting: Psychiatry

## 2018-06-04 ENCOUNTER — Other Ambulatory Visit: Payer: Self-pay

## 2018-06-04 DIAGNOSIS — E039 Hypothyroidism, unspecified: Secondary | ICD-10-CM | POA: Diagnosis present

## 2018-06-04 DIAGNOSIS — F209 Schizophrenia, unspecified: Principal | ICD-10-CM | POA: Diagnosis present

## 2018-06-04 DIAGNOSIS — Z87891 Personal history of nicotine dependence: Secondary | ICD-10-CM

## 2018-06-04 DIAGNOSIS — F603 Borderline personality disorder: Secondary | ICD-10-CM | POA: Diagnosis present

## 2018-06-04 DIAGNOSIS — R45850 Homicidal ideations: Secondary | ICD-10-CM | POA: Diagnosis present

## 2018-06-04 DIAGNOSIS — K219 Gastro-esophageal reflux disease without esophagitis: Secondary | ICD-10-CM | POA: Diagnosis present

## 2018-06-04 DIAGNOSIS — E119 Type 2 diabetes mellitus without complications: Secondary | ICD-10-CM | POA: Diagnosis present

## 2018-06-04 DIAGNOSIS — R45851 Suicidal ideations: Secondary | ICD-10-CM | POA: Diagnosis present

## 2018-06-04 DIAGNOSIS — I10 Essential (primary) hypertension: Secondary | ICD-10-CM | POA: Diagnosis present

## 2018-06-04 DIAGNOSIS — F70 Mild intellectual disabilities: Secondary | ICD-10-CM | POA: Diagnosis present

## 2018-06-04 DIAGNOSIS — F200 Paranoid schizophrenia: Secondary | ICD-10-CM | POA: Diagnosis not present

## 2018-06-04 DIAGNOSIS — F79 Unspecified intellectual disabilities: Secondary | ICD-10-CM | POA: Diagnosis present

## 2018-06-04 DIAGNOSIS — Z794 Long term (current) use of insulin: Secondary | ICD-10-CM

## 2018-06-04 LAB — URINALYSIS, ROUTINE W REFLEX MICROSCOPIC
Bacteria, UA: NONE SEEN
Bilirubin Urine: NEGATIVE
Glucose, UA: NEGATIVE mg/dL
Hgb urine dipstick: NEGATIVE
Ketones, ur: NEGATIVE mg/dL
Nitrite: NEGATIVE
Protein, ur: 100 mg/dL — AB
Specific Gravity, Urine: 1.019 (ref 1.005–1.030)
Squamous Epithelial / HPF: NONE SEEN (ref 0–5)
pH: 6 (ref 5.0–8.0)

## 2018-06-04 LAB — SARS CORONAVIRUS 2 BY RT PCR (HOSPITAL ORDER, PERFORMED IN ~~LOC~~ HOSPITAL LAB): SARS Coronavirus 2: NEGATIVE

## 2018-06-04 LAB — GLUCOSE, CAPILLARY
Glucose-Capillary: 112 mg/dL — ABNORMAL HIGH (ref 70–99)
Glucose-Capillary: 201 mg/dL — ABNORMAL HIGH (ref 70–99)

## 2018-06-04 MED ORDER — LURASIDONE HCL 40 MG PO TABS
120.0000 mg | ORAL_TABLET | Freq: Every day | ORAL | Status: DC
  Administered 2018-06-05 – 2018-06-07 (×3): 120 mg via ORAL
  Filled 2018-06-04 (×3): qty 3

## 2018-06-04 MED ORDER — MAGNESIUM HYDROXIDE 400 MG/5ML PO SUSP: 30.0000 mL | Freq: Every day | ORAL | Status: DC | PRN

## 2018-06-04 MED ORDER — LAMOTRIGINE 100 MG PO TABS
100.0000 mg | ORAL_TABLET | Freq: Every day | ORAL | Status: DC
  Administered 2018-06-04 – 2018-06-06 (×3): 100 mg via ORAL
  Filled 2018-06-04 (×3): qty 1

## 2018-06-04 MED ORDER — ACETAMINOPHEN 325 MG PO TABS: 650.0000 mg | ORAL_TABLET | Freq: Four times a day (QID) | ORAL | Status: DC | PRN

## 2018-06-04 MED ORDER — GABAPENTIN 600 MG PO TABS
600.0000 mg | ORAL_TABLET | Freq: Three times a day (TID) | ORAL | Status: DC
  Administered 2018-06-04 – 2018-06-07 (×8): 600 mg via ORAL
  Filled 2018-06-04 (×9): qty 1

## 2018-06-04 MED ORDER — ALUM & MAG HYDROXIDE-SIMETH 200-200-20 MG/5ML PO SUSP: 30.0000 mL | ORAL | Status: DC | PRN

## 2018-06-04 MED ORDER — CARVEDILOL 6.25 MG PO TABS
6.2500 mg | ORAL_TABLET | Freq: Two times a day (BID) | ORAL | Status: DC
  Administered 2018-06-04 – 2018-06-07 (×6): 6.25 mg via ORAL
  Filled 2018-06-04 (×6): qty 1

## 2018-06-04 MED ORDER — METFORMIN HCL 500 MG PO TABS
500.0000 mg | ORAL_TABLET | Freq: Two times a day (BID) | ORAL | Status: DC
  Administered 2018-06-04 – 2018-06-07 (×6): 500 mg via ORAL
  Filled 2018-06-04 (×6): qty 1

## 2018-06-04 MED ORDER — LISINOPRIL 20 MG PO TABS
10.0000 mg | ORAL_TABLET | Freq: Every day | ORAL | Status: DC
  Administered 2018-06-04 – 2018-06-07 (×4): 10 mg via ORAL
  Filled 2018-06-04 (×4): qty 1

## 2018-06-04 MED ORDER — DULOXETINE HCL 30 MG PO CPEP
60.0000 mg | ORAL_CAPSULE | Freq: Every day | ORAL | Status: DC
  Administered 2018-06-04 – 2018-06-07 (×4): 60 mg via ORAL
  Filled 2018-06-04 (×5): qty 2

## 2018-06-04 MED ORDER — FERROUS SULFATE 325 (65 FE) MG PO TABS
325.0000 mg | ORAL_TABLET | Freq: Two times a day (BID) | ORAL | Status: DC
  Administered 2018-06-04 – 2018-06-07 (×6): 325 mg via ORAL
  Filled 2018-06-04 (×6): qty 1

## 2018-06-04 MED ORDER — INSULIN DETEMIR 100 UNIT/ML ~~LOC~~ SOLN
15.0000 [IU] | Freq: Two times a day (BID) | SUBCUTANEOUS | Status: DC
  Filled 2018-06-04 (×7): qty 0.15

## 2018-06-04 MED ORDER — TRAZODONE HCL 100 MG PO TABS
100.0000 mg | ORAL_TABLET | Freq: Every day | ORAL | Status: DC
  Administered 2018-06-04 – 2018-06-06 (×3): 100 mg via ORAL
  Filled 2018-06-04 (×3): qty 1

## 2018-06-04 MED ORDER — PANTOPRAZOLE SODIUM 40 MG PO TBEC
40.0000 mg | DELAYED_RELEASE_TABLET | Freq: Every day | ORAL | Status: DC
  Administered 2018-06-04 – 2018-06-07 (×4): 40 mg via ORAL
  Filled 2018-06-04 (×4): qty 1

## 2018-06-04 MED ORDER — LORAZEPAM 1 MG PO TABS: 1.0000 mg | ORAL_TABLET | ORAL | Status: DC | PRN

## 2018-06-04 MED ORDER — OLANZAPINE 5 MG PO TBDP: 5.0000 mg | ORAL_TABLET | Freq: Three times a day (TID) | ORAL | Status: DC | PRN

## 2018-06-04 MED ORDER — ZIPRASIDONE MESYLATE 20 MG IM SOLR: 20.0000 mg | INTRAMUSCULAR | Status: DC | PRN

## 2018-06-04 MED ORDER — LORATADINE 10 MG PO TABS
10.0000 mg | ORAL_TABLET | Freq: Every day | ORAL | Status: DC
  Administered 2018-06-04 – 2018-06-07 (×4): 10 mg via ORAL
  Filled 2018-06-04 (×4): qty 1

## 2018-06-04 MED ORDER — LEVOTHYROXINE SODIUM 75 MCG PO TABS
150.0000 ug | ORAL_TABLET | Freq: Every day | ORAL | Status: DC
  Administered 2018-06-05 – 2018-06-07 (×3): 150 ug via ORAL
  Filled 2018-06-04 (×3): qty 2

## 2018-06-04 MED ORDER — ATORVASTATIN CALCIUM 20 MG PO TABS
10.0000 mg | ORAL_TABLET | Freq: Every day | ORAL | Status: DC
  Administered 2018-06-04 – 2018-06-07 (×4): 10 mg via ORAL
  Filled 2018-06-04 (×4): qty 1
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
## 2018-06-04 NOTE — ED Notes (Signed)
Pt given lunch tray.
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
## 2018-06-04 NOTE — BH Assessment (Signed)
Patient is to be admitted to [facility] by Dr. Tamala Julian. Pending COVID-19 results.  Attending Physician will be Dr. Weber Cooks.   Patient has been assigned to room 301, by [place] staff is aware of the admission:  Ronnie, ER Secretary    Dr. Quentin Cornwall, ER MD   Amy B., Patient's Nurse   Oley Balm, Patient Access.
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
## 2018-06-04 NOTE — Plan of Care (Signed)
  Problem: Education: Goal: Knowledge of [place] [facility] Education information/materials will improve Note:  Instructions  given on [place] education and unit programing , verbalize understanding  of information received .

## 2018-06-04 NOTE — BH Assessment (Signed)
Pt is here from [place]. Pt has been faxed out to facilities that serve MR patients.

## 2018-06-04 NOTE — ED Notes (Signed)
Pt states she is having hallucinations. Pt states she is seeing blood dripping from the ceiling. Pt states this is a common hallucination. Pt states she also feels as if the walls are closing in and that is part of her disease. Pt asking for her meds to be adjusted specifically her Latuda.

## 2018-06-04 NOTE — BH Assessment (Signed)
Pt denies by [facility] in [place] due to maxed capacity for IDD pts.

## 2018-06-04 NOTE — Progress Notes (Signed)
Patient stated that her medical doctor told her she does not need any Insulin.Patient refused her insulin.
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
## 2018-06-04 NOTE — ED Notes (Signed)
Pt discharged to BMU under IVC. VS stable. Denies pain. Belongings sent with patient. Report given to [place], Therapist, sports.

## 2018-06-04 NOTE — ED Notes (Signed)
Report given to the psychiatrist on call. Computer moved to the room by [employer].

## 2018-06-05 DIAGNOSIS — F200 Paranoid schizophrenia: Secondary | ICD-10-CM

## 2018-06-05 DIAGNOSIS — F603 Borderline personality disorder: Secondary | ICD-10-CM

## 2018-06-05 LAB — HEMOGLOBIN A1C
Hgb A1c MFr Bld: 5.3 % (ref 4.8–5.6)
Mean Plasma Glucose: 105.41 mg/dL

## 2018-06-05 LAB — GLUCOSE, CAPILLARY
Glucose-Capillary: 171 mg/dL — ABNORMAL HIGH (ref 70–99)
Glucose-Capillary: 181 mg/dL — ABNORMAL HIGH (ref 70–99)
Glucose-Capillary: 117 mg/dL — ABNORMAL HIGH (ref 70–99)

## 2018-06-05 LAB — LIPID PANEL
Cholesterol: 155 mg/dL (ref 0–200)
HDL: 54 mg/dL (ref >40)
LDL Cholesterol: 82 mg/dL (ref 0–99)
Total CHOL/HDL Ratio: 2.9 RATIO
Triglycerides: 93 mg/dL (ref <150)
VLDL: 19 mg/dL (ref 0–40)

## 2018-06-05 LAB — TSH: TSH: 0.602 u[IU]/mL (ref 0.350–4.500)

## 2018-06-05 NOTE — Progress Notes (Signed)
Patient refused her Levemir stating "my doctor took me off that, I'm not going against my doctor".

## 2018-06-05 NOTE — Plan of Care (Signed)
  Problem: Coping: Goal: Coping ability will improve Outcome: Progressing  D: Patient says she is still having thoughts of strangling herself but is contracting for safety. Still is having thoughts of hurting the group home staff. Has been needy at times. Mood is anxious. Affect is appropriate to circumstance.  A: Continue to monitor for safety. R: Safety maintained.

## 2018-06-05 NOTE — BHH Suicide Risk Assessment (Signed)
[facility] Admission Suicide Risk Assessment   Nursing information obtained from:  Patient Demographic factors:  Caucasian Current Mental Status:  Suicidal ideation indicated by patient Loss Factors:  NA Historical Factors:  Impulsivity, Family history of mental illness or substance abuse Risk Reduction Factors:  Religious beliefs about death  Total Time spent with patient: 1.5 hours Principal Problem: Borderline personality disorder ([place]) Diagnosis:  Principal Problem:   Borderline personality disorder ([place]) Active Problems:   Schizophrenia ([place])  Subjective Data:  See HPI  Continued Clinical Symptoms:  Alcohol Use Disorder Identification Test Final Score (AUDIT): 1 The "Alcohol Use Disorders Identification Test", Guidelines for Use in Primary Care, Second Edition.  World Pharmacologist [employer]). Score between 0-7:  no or low risk or alcohol related problems. Score between 8-15:  moderate risk of alcohol related problems. Score between 16-19:  high risk of alcohol related problems. Score 20 or above:  warrants further diagnostic evaluation for alcohol dependence and treatment.   CLINICAL FACTORS:   Personality Disorders:   Cluster B   Musculoskeletal: See HPI  Psychiatric Specialty Exam: See HPI    COGNITIVE FEATURES THAT CONTRIBUTE TO RISK:  Closed-mindedness    SUICIDE RISK:   Moderate:  Frequent suicidal ideation with limited intensity, and duration, some specificity in terms of plans, no associated intent, good self-control, limited dysphoria/symptomatology, some risk factors present, and identifiable protective factors, including available and accessible social support.  PLAN OF CARE: see HPI  I certify that inpatient services furnished can reasonably be expected to improve the patient's condition.   Barbera Perritt, MD 06/05/2018, 12:24 PM

## 2018-06-05 NOTE — BHH Suicide Risk Assessment (Signed)
[place] INPATIENT:  Family/Significant Other Suicide Prevention Education  Suicide Prevention Education:  Patient Refusal for Family/Significant Other Suicide Prevention Education: The patient Jessica [name] has refused to provide written consent for family/significant other to be provided Family/Significant Other Suicide Prevention Education during admission and/or prior to discharge.  Physician notified.  Clent Damore  CUEBAS-COLON 06/05/2018, 3:59 PM

## 2018-06-05 NOTE — Progress Notes (Signed)
D: Patient says she is still having thoughts of strangling herself but is contracting for safety. Still is having thoughts of hurting the group home staff. Has been needy at times. Mood is anxious. Affect is appropriate to circumstance.  A: Continue to monitor for safety. R: Safety maintained.

## 2018-06-05 NOTE — Progress Notes (Signed)
Patient continues to refuse Levemir stating to this writer " I'm not taking that, my doctor took me off that".
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
## 2018-06-05 NOTE — Progress Notes (Signed)
D: Patient is asking for someone to look inside of her ear. She says that she has bedbugs in her ear. A: Continue to monitor for safety. R: Safety maintained.
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
## 2018-06-05 NOTE — Progress Notes (Signed)
Patient is asleep, so this writer did not give patients noon medication. Patient will receive her evening dose.

## 2018-06-05 NOTE — BHH Group Notes (Signed)
LCSW Group Therapy Note 06/05/2018 1:15pm  Type of Therapy and Topic: Group Therapy: Feelings Around Returning Home & Establishing a Supportive Framework and Supporting Oneself When Supports Not Available  Participation Level: Did Not Attend  Description of Group:  Patients first processed thoughts and feelings about upcoming discharge. These included fears of upcoming changes, lack of change, new living environments, judgements and expectations from others and overall stigma of mental health issues. The group then discussed the definition of a supportive framework, what that looks and feels like, and how do to discern it from an unhealthy non-supportive network. The group identified different types of supports as well as what to do when your family/friends are less than helpful or unavailable  Therapeutic Goals  1. Patient will identify one healthy supportive network that they can use at discharge. 2. Patient will identify one factor of a supportive framework and how to tell it from an unhealthy network. 3. Patient able to identify one coping skill to use when they do not have positive supports from others. 4. Patient will demonstrate ability to communicate their needs through discussion and/or role plays.  Summary of Patient Progress:  Pt was invited to attend group but chose not to attend. CSW will continue to encourage pt to attend group throughout their admission.   Therapeutic Modalities Cognitive Behavioral Therapy Motivational Interviewing   Slayter Moorhouse  CUEBAS-COLON, LCSW 06/05/2018 11:59 AM

## 2018-06-06 NOTE — BHH Suicide Risk Assessment (Signed)
[place] INPATIENT:  Family/Significant Other Suicide Prevention Education  Suicide Prevention Education:  Contact Attempts: Ollen Bowl, sister, [phone] has been identified by the patient as the family member/significant other with whom the patient will be residing, and identified as the person(s) who will aid the patient in the event of a mental health crisis.  With written consent from the patient, two attempts were made to provide suicide prevention education, prior to and/or following the patient's discharge.  We were unsuccessful in providing suicide prevention education.  A suicide education pamphlet was given to the patient to share with family/significant other.  Date and time of first attempt: 06/06/2018 at 11:06am Date and time of second attempt: Second attempt is needed.  Rozann Lesches 06/06/2018, 11:05 AM
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
## 2018-06-06 NOTE — BHH Counselor (Signed)
CSW attempted to call the pt's friend Mindi Curling at [phone].  CSW was unable to speak with someone and left HIPAA compliant voicemail.   CSW notes that the voicemail identified the phone number to be associated to a gentleman of another name.    Assunta Curtis, MSW, LCSW 06/06/2018 11:42 AM
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
## 2018-06-06 NOTE — Progress Notes (Signed)
Recreation Therapy Notes  INPATIENT RECREATION THERAPY ASSESSMENT  Patient Details Name: Jessica [name] MRN: [id number] DOB: 10/20/1965 Today's Date: 06/06/2018       Information Obtained From: Patient  Able to Participate in Assessment/Interview: Yes  Patient Presentation: Responsive  Reason for Admission (Per Patient): Active Symptoms  Patient Stressors: Other (Comment)(Group home)  Coping Skills:   Music, Art  Leisure Interests (2+):  Music - Listen, Art - Draw, Art - Coloring, Individual - Journaling  Frequency of Recreation/Participation: Weekly  Awareness of Community Resources:     [employer]:     Current Use:    If no, Barriers?:    Expressed Interest in [place] of Residence:  Insurance underwriter  Patient Main Form of Transportation: Other (Comment)(Group home)  Patient Strengths:  Nice and get along with people  Patient Identified Areas of Improvement:  I want to work on my anger  Patient Goal for Hospitalization:  To work on my anger  Current SI (including self-harm):  No  Current HI:  No  Current AVH: No  Staff Intervention Plan: Group Attendance, Collaborate with Interdisciplinary Treatment Team  Consent to Intern Participation: N/A  Annaleigh Steinmeyer 06/06/2018, 11:56 AM

## 2018-06-06 NOTE — Plan of Care (Signed)
  Problem: Coping: Goal: Coping ability will improve Outcome: Progressing  D: Patient reports passive SI but contracts for safety on the unit. Is still having thoughts of hurting the people who own her group home and does not want to go back there. Mood is pleasant. Affect is brighter today. She asked for paper and did some coloring and writing. She has been interacting appropriately with staff and peers. Denies AVH. A: Continue to monitor for safety. R: Safety maintained.

## 2018-06-06 NOTE — BHH Group Notes (Signed)
LCSW Group Therapy Note   06/06/2018 1:00 PM  Type of Therapy and Topic:  Group Therapy:  Overcoming Obstacles   Participation Level:  Did Not Attend   Description of Group:    In this group patients will be encouraged to explore what they see as obstacles to their own wellness and recovery. They will be guided to discuss their thoughts, feelings, and behaviors related to these obstacles. The group will process together ways to cope with barriers, with attention given to specific choices patients can make. Each patient will be challenged to identify changes they are motivated to make in order to overcome their obstacles. This group will be process-oriented, with patients participating in exploration of their own experiences as well as giving and receiving support and challenge from other group members.   Therapeutic Goals: 1. Patient will identify personal and current obstacles as they relate to admission. 2. Patient will identify barriers that currently interfere with their wellness or overcoming obstacles.  3. Patient will identify feelings, thought process and behaviors related to these barriers. 4. Patient will identify two changes they are willing to make to overcome these obstacles:      Summary of Patient Progress  X   Therapeutic Modalities:   Cognitive Behavioral Therapy Solution Focused Therapy Motivational Interviewing Relapse Prevention Therapy  Assunta Curtis, MSW, LCSW 06/06/2018 12:41 PM

## 2018-06-06 NOTE — Progress Notes (Signed)
Recreation Therapy Notes    Date: 06/06/2018  Time: 9:30 am  Location: Craft room  Behavioral response: Appropriate   Intervention Topic: Problem Solving  Discussion/Intervention:  Group content on today was focused on problem solving. The group described what problem solving is. Patients expressed how problems affect them and how they deal with problems. Individuals identified healthy ways to deal with problems. Patients explained what normally happens to them when they do not deal with problems. The group expressed reoccurring problems for them. The group participated in the intervention "Ways to Solve problems" where patients were given a chance to explore different ways to solve problems.   Clinical Observations/Feedback:  Patient came to group and stated problem solving is thinking about how you are going to solve a problem. She was pulled from group by Education officer, museum but later returned.  Individual was social with peers and staff while participating in the intervention. Victory Strollo LRT/CTRS         Shaquile Lutze 06/06/2018 11:19 AM

## 2018-06-07 MED ORDER — TRAZODONE HCL 100 MG PO TABS: 100.0000 mg | ORAL_TABLET | Freq: Every day | ORAL | 0 refills | Status: DC

## 2018-06-07 MED ORDER — PANTOPRAZOLE SODIUM 40 MG PO TBEC: 40.0000 mg | DELAYED_RELEASE_TABLET | Freq: Every day | ORAL | 0 refills | Status: DC

## 2018-06-07 MED ORDER — FERROUS SULFATE 325 (65 FE) MG PO TABS: 325.0000 mg | ORAL_TABLET | Freq: Two times a day (BID) | ORAL | 0 refills | Status: DC

## 2018-06-07 MED ORDER — LAMOTRIGINE 100 MG PO TABS: 100.0000 mg | ORAL_TABLET | Freq: Every day | ORAL | 0 refills | Status: DC

## 2018-06-07 MED ORDER — METFORMIN HCL 500 MG PO TABS: 500.0000 mg | ORAL_TABLET | Freq: Two times a day (BID) | ORAL | 0 refills | Status: DC

## 2018-06-07 MED ORDER — INSULIN DETEMIR 100 UNIT/ML ~~LOC~~ SOLN: 15.0000 [IU] | Freq: Two times a day (BID) | SUBCUTANEOUS | 1 refills | Status: DC

## 2018-06-07 MED ORDER — GABAPENTIN 600 MG PO TABS: 600.0000 mg | ORAL_TABLET | Freq: Three times a day (TID) | ORAL | 0 refills | Status: DC

## 2018-06-07 MED ORDER — ATORVASTATIN CALCIUM 10 MG PO TABS: 10.0000 mg | ORAL_TABLET | Freq: Every day | ORAL | 0 refills | Status: DC

## 2018-06-07 MED ORDER — LORATADINE 10 MG PO TABS: 10.0000 mg | ORAL_TABLET | Freq: Every day | ORAL | 0 refills | Status: DC

## 2018-06-07 MED ORDER — LISINOPRIL 10 MG PO TABS: 10.0000 mg | ORAL_TABLET | Freq: Every day | ORAL | 0 refills | Status: DC

## 2018-06-07 MED ORDER — DULOXETINE HCL 60 MG PO CPEP: 60.0000 mg | ORAL_CAPSULE | Freq: Every day | ORAL | 0 refills | Status: DC

## 2018-06-07 MED ORDER — LEVOTHYROXINE SODIUM 150 MCG PO TABS: 150.0000 ug | ORAL_TABLET | Freq: Every day | ORAL | 0 refills | Status: DC

## 2018-06-07 MED ORDER — LURASIDONE HCL 120 MG PO TABS: 120.0000 mg | ORAL_TABLET | Freq: Every day | ORAL | 0 refills | Status: DC

## 2018-06-07 MED ORDER — CARVEDILOL 6.25 MG PO TABS: 6.2500 mg | ORAL_TABLET | Freq: Two times a day (BID) | ORAL | 0 refills | Status: DC

## 2018-06-07 NOTE — Progress Notes (Signed)
Recreation Therapy Notes   Date: 06/07/2018  Time: 9:30 am  Location: Craft room  Behavioral response: Appropriate   Intervention Topic: Self-esteem  Discussion/Intervention:  Group content today was focused on self-esteem. Patient defined self-esteem and where it comes form. The group described reasons self-esteem is important. Individuals stated things that impact self-esteem and positive ways to improve self-esteem. The group participated in the intervention "Collage of Me" where patients were able to create a collage of positive things that makes them who they are.  Clinical Observations/Feedback:  Patient came to group and defined self-esteem as self control and being unique. She explained that she tries to build her self-esteem by being herself, exercising, staying positive, dieting, holding her head high and taking her medication.  Individual was social with peers and staff while participating in the intervention. Trinka Keshishyan LRT/CTRS         Hulon Ferron 06/07/2018 11:28 AM

## 2018-06-07 NOTE — Progress Notes (Signed)
Nursing discharge note: Patient discharged home per MD order.  Patient received all personal belongings from unit and locker.  Reviewed AVS/transition record with patient and she indicates understanding.  Patient will follow up with [facility].   Patient denies any thoughts of self harm.  He/she left ambulatory with the group home representative.

## 2018-06-07 NOTE — BHH Group Notes (Signed)
Feelings Around Diagnosis 06/07/2018 1PM  Type of Therapy/Topic:  Group Therapy:  Feelings about Diagnosis  Participation Level:  Did Not Attend   Description of Group:   This group will allow patients to explore their thoughts and feelings about diagnoses they have received. Patients will be guided to explore their level of understanding and acceptance of these diagnoses. Facilitator will encourage patients to process their thoughts and feelings about the reactions of others to their diagnosis and will guide patients in identifying ways to discuss their diagnosis with significant others in their lives. This group will be process-oriented, with patients participating in exploration of their own experiences, giving and receiving support, and processing challenge from other group members.   Therapeutic Goals: 1. Patient will demonstrate understanding of diagnosis as evidenced by identifying two or more symptoms of the disorder 2. Patient will be able to express two feelings regarding the diagnosis 3. Patient will demonstrate their ability to communicate their needs through discussion and/or role play  Summary of Patient Progress:       Therapeutic Modalities:   Cognitive Behavioral Therapy Brief Therapy Feelings Identification    Yvette Rack, LCSW 06/07/2018 1:56 PM

## 2018-06-07 NOTE — Plan of Care (Signed)
  Problem: Education: Goal: Knowledge of [place] [facility] Education information/materials will improve Outcome: Completed/Met   Problem: Coping: Goal: Coping ability will improve Outcome: Completed/Met   Problem: Medication: Goal: Compliance with prescribed medication regimen will improve Outcome: Completed/Met   Problem: Safety: Goal: Ability to identify and utilize support systems that promote safety will improve Outcome: Completed/Met

## 2018-06-07 NOTE — BHH Suicide Risk Assessment (Signed)
[facility] Discharge Suicide Risk Assessment   Principal Problem: Schizophrenia [facility]) Discharge Diagnoses: Principal Problem:   Schizophrenia ([place]) Active Problems:   Diabetes mellitus without complication ([place])   Mild intellectual disability   Borderline personality disorder ([place])   Total Time spent with patient: 45 minutes  Musculoskeletal: Strength & Muscle Tone: within normal limits Gait & Station: normal Patient leans: N/A  Psychiatric Specialty Exam: Review of Systems  Constitutional: Negative.   HENT: Negative.   Eyes: Negative.   Respiratory: Negative.   Cardiovascular: Negative.   Gastrointestinal: Negative.   Musculoskeletal: Negative.   Skin: Negative.   Neurological: Negative.   Psychiatric/Behavioral: Positive for hallucinations. Negative for depression, memory loss, substance abuse and suicidal ideas. The patient is nervous/anxious. The patient does not have insomnia.     Blood pressure 134/71, pulse 82, temperature 98.2 F (36.8 C), temperature source Oral, resp. rate 16, height 4\' 10"  (1.473 m), weight 69.4 kg, SpO2 99 %.Body mass index is 31.98 kg/m.  General Appearance: Casual  Eye Contact::  Fair  Speech:  Slow409  Volume:  Normal  Mood:  Euthymic  Affect:  Constricted  Thought Process:  Coherent  Orientation:  Full (Time, Place, and Person)  Thought Content:  Logical  Suicidal Thoughts:  No  Homicidal Thoughts:  No  Memory:  Immediate;   Fair Recent;   Fair Remote;   Fair  Judgement:  Fair  Insight:  Shallow  Psychomotor Activity:  Normal  Concentration:  Fair  Recall:  [place]  Language: Fair  Akathisia:  No  Handed:  Right  AIMS (if indicated):     Assets:  Desire for Improvement Housing Social Support  Sleep:  Number of Hours: 7  Cognition: Impaired,  Mild  ADL's:  Intact   Mental Status Per Nursing Assessment::   On Admission:  Suicidal ideation indicated by patient  Demographic Factors:  Caucasian  Loss  Factors: Legal issues  Historical Factors: Impulsivity  Risk Reduction Factors:   Living with another person, especially a relative, Positive social support and Positive therapeutic relationship  Continued Clinical Symptoms:  Schizophrenia:   Paranoid or undifferentiated type  Cognitive Features That Contribute To Risk:  Thought constriction (tunnel vision)    Suicide Risk:  Minimal: No identifiable suicidal ideation.  Patients presenting with no risk factors but with morbid ruminations; may be classified as minimal risk based on the severity of the depressive symptoms  Follow-up Information    [facility], [facility] Follow up on 06/14/2018.   Why:  Please attend your appointment scheudled for Jun 14, 2018 at [place] information: [place] [id number] [phone]           Plan Of Care/Follow-up recommendations:  Activity:  Activity as tolerated Diet:  Regular diet Other:  Follow-up with outpatient treatment providers at [facility].  Continue current medicine.  Work on Set designer.  Alethia Berthold, MD 06/07/2018, 10:25 AM

## 2018-06-07 NOTE — Progress Notes (Signed)
Recreation Therapy Notes  INPATIENT RECREATION TR PLAN  Patient Details Name: Jessica [name] MRN: [id number] DOB: 1965-09-09 Today's Date: 06/07/2018  Rec Therapy Plan Is patient appropriate for Therapeutic Recreation?: Yes Treatment times per week: at least 3 Estimated Length of Stay: 5-7 days TR Treatment/Interventions: Group participation (Comment)  Discharge Criteria Pt will be discharged from therapy if:: Discharged Treatment plan/goals/alternatives discussed and agreed upon by:: Patient/family  Discharge Summary Short term goals set: Patient will identify 3 positive coping skills strategies to use post d/c within 5 recreation therapy group sessions Short term goals met: Adequate for discharge Progress toward goals comments: Groups attended Which groups?: Self-esteem, Other (Comment)(Problem Solving) Reason goals not met: N/A Therapeutic equipment acquired: N/A Reason patient discharged from therapy: Discharge from hospital Pt/family agrees with progress & goals achieved: Yes Date patient discharged from therapy: 06/07/18   [name] 06/07/2018, 11:35 AM

## 2018-06-07 NOTE — Plan of Care (Signed)
  Problem: Coping: Goal: Coping ability will improve Outcome: Progressing  D: Patient washed her clothes today. Was interacting well with staff and peers and socializing on the unit. Admits to still being upset and anxious about her situation at the group home she came from but denies SI at this time. Contracts for safety but says she feels like she needs to stay here for a little while and does not feel ready to leave yet. Does not feel like she can contract for safety at the group home that she came from A: Continue to monitor for safety. R: Safety maintained.

## 2018-06-07 NOTE — BHH Counselor (Signed)
CSW spoke with group home owner, Joaquim Lai, [phone].  Group home will provide transportation and pick the patient up at 1:00pm  Assunta Curtis, MSW, LCSW 06/07/2018 11:06 AM

## 2018-06-07 NOTE — Progress Notes (Signed)
D: Patient washed her clothes today. Was interacting well with staff and peers and socializing on the unit. Admits to still being upset and anxious about her situation at the group home she came from but denies SI at this time. Contracts for safety but says she feels like she needs to stay here for a little while and does not feel ready to leave yet. Does not feel like she can contract for safety at the group home that she came from A: Continue to monitor for safety. R: Safety maintained.
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
## 2018-06-07 NOTE — Progress Notes (Signed)
  [facility] Adult Case Management Discharge Plan :  Will you be returning to the same living situation after discharge:  Yes,  pt is returning to her group home.  At discharge, do you have transportation home?: Yes,  group home will provide transportation. Do you have the ability to pay for your medications: Yes,  Medicare  Release of information consent forms completed and in the chart;  Patient's signature needed at discharge.  Patient to Follow up at: Follow-up Information    [facility] Follow up on 06/14/2018.   Why:  Please attend your appointment scheudled for Jun 14, 2018 at [facility] information: [place] [address] [phone]           Next level of care provider has access to [place] and Suicide Prevention discussed: Yes,  SPE completed with pt's sister.   Have you used any form of tobacco in the last 30 days? (Cigarettes, Smokeless Tobacco, Cigars, and/or Pipes): No  Has patient been referred to the Quitline?: N/A patient is not a smoker  Patient has been referred for addiction treatment: [place], LCSW 06/07/2018, 11:28 AM

## 2018-06-14 ENCOUNTER — Emergency Department
Admission: EM | Admit: 2018-06-14 | Discharge: 2018-06-15 | Disposition: A | Discharge status: Other Acute Inpatient Hospital | Payer: Medicare Other | Attending: Emergency Medicine | Admitting: Emergency Medicine

## 2018-06-14 ENCOUNTER — Other Ambulatory Visit: Payer: Self-pay

## 2018-06-14 DIAGNOSIS — Z87891 Personal history of nicotine dependence: Secondary | ICD-10-CM | POA: Insufficient documentation

## 2018-06-14 DIAGNOSIS — E119 Type 2 diabetes mellitus without complications: Secondary | ICD-10-CM | POA: Insufficient documentation

## 2018-06-14 DIAGNOSIS — F329 Major depressive disorder, single episode, unspecified: Secondary | ICD-10-CM | POA: Insufficient documentation

## 2018-06-14 DIAGNOSIS — F250 Schizoaffective disorder, bipolar type: Secondary | ICD-10-CM

## 2018-06-14 DIAGNOSIS — Z046 Encounter for general psychiatric examination, requested by authority: Secondary | ICD-10-CM | POA: Insufficient documentation

## 2018-06-14 DIAGNOSIS — R4689 Other symptoms and signs involving appearance and behavior: Secondary | ICD-10-CM

## 2018-06-14 DIAGNOSIS — F419 Anxiety disorder, unspecified: Secondary | ICD-10-CM | POA: Insufficient documentation

## 2018-06-14 DIAGNOSIS — R45850 Homicidal ideations: Secondary | ICD-10-CM

## 2018-06-14 DIAGNOSIS — Z1159 Encounter for screening for other viral diseases: Secondary | ICD-10-CM | POA: Insufficient documentation

## 2018-06-14 LAB — URINE DRUG SCREEN, QUALITATIVE (ARMC ONLY)
Amphetamines, Ur Screen: NOT DETECTED
Barbiturates, Ur Screen: NOT DETECTED
Benzodiazepine, Ur Scrn: NOT DETECTED
Cannabinoid 50 Ng, Ur ~~LOC~~: NOT DETECTED
Cocaine Metabolite,Ur ~~LOC~~: NOT DETECTED
MDMA (Ecstasy)Ur Screen: NOT DETECTED
Methadone Scn, Ur: NOT DETECTED
Opiate, Ur Screen: NOT DETECTED
Phencyclidine (PCP) Ur S: NOT DETECTED
Tricyclic, Ur Screen: NOT DETECTED

## 2018-06-14 LAB — COMPREHENSIVE METABOLIC PANEL
ALT: 33 U/L (ref 0–44)
AST: 29 U/L (ref 15–41)
Albumin: 4.3 g/dL (ref 3.5–5.0)
Alkaline Phosphatase: 89 U/L (ref 38–126)
Anion gap: 10 (ref 5–15)
BUN: 20 mg/dL (ref 6–20)
CO2: 27 mmol/L (ref 22–32)
Calcium: 7.8 mg/dL — ABNORMAL LOW (ref 8.9–10.3)
Chloride: 100 mmol/L (ref 98–111)
Creatinine, Ser: 0.61 mg/dL (ref 0.44–1.00)
GFR calc Af Amer: >60 mL/min (ref >60)
GFR calc non Af Amer: >60 mL/min (ref >60)
Glucose, Bld: 175 mg/dL — ABNORMAL HIGH (ref 70–99)
Potassium: 4 mmol/L (ref 3.5–5.1)
Sodium: 137 mmol/L (ref 135–145)
Total Bilirubin: 0.5 mg/dL (ref 0.3–1.2)
Total Protein: 7.5 g/dL (ref 6.5–8.1)

## 2018-06-14 LAB — CBC WITH DIFFERENTIAL/PLATELET
Abs Immature Granulocytes: 0.02 10*3/uL (ref 0.00–0.07)
Basophils Absolute: 0 10*3/uL (ref 0.0–0.1)
Basophils Relative: 0 %
Eosinophils Absolute: 0.4 10*3/uL (ref 0.0–0.5)
Eosinophils Relative: 6 %
HCT: 38.7 % (ref 36.0–46.0)
Hemoglobin: 12.5 g/dL (ref 12.0–15.0)
Immature Granulocytes: 0 %
Lymphocytes Relative: 40 %
Lymphs Abs: 2.3 10*3/uL (ref 0.7–4.0)
MCH: 30.4 pg (ref 26.0–34.0)
MCHC: 32.3 g/dL (ref 30.0–36.0)
MCV: 94.2 fL (ref 80.0–100.0)
Monocytes Absolute: 0.5 10*3/uL (ref 0.1–1.0)
Monocytes Relative: 9 %
Neutro Abs: 2.5 10*3/uL (ref 1.7–7.7)
Neutrophils Relative %: 45 %
Platelets: 80 10*3/uL — ABNORMAL LOW (ref 150–400)
RBC: 4.11 MIL/uL (ref 3.87–5.11)
RDW: 12.5 % (ref 11.5–15.5)
WBC: 5.8 10*3/uL (ref 4.0–10.5)
nRBC: 0 % (ref 0.0–0.2)

## 2018-06-14 LAB — ETHANOL: Alcohol, Ethyl (B): <10 mg/dL (ref <10)
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
## 2018-06-14 NOTE — ED Notes (Signed)
Pt given graham crackers and ice water. No other needs voiced at this time. Will continue to monitor Q15 min rounds.
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
## 2018-06-15 ENCOUNTER — Inpatient Hospital Stay
Admission: AD | Admit: 2018-06-15 | Discharge: 2018-06-20 | DRG: 882 | Disposition: A | Discharge status: Home / Self Care | Payer: Medicare Other | Attending: Psychiatry | Admitting: Psychiatry

## 2018-06-15 DIAGNOSIS — I10 Essential (primary) hypertension: Secondary | ICD-10-CM | POA: Diagnosis present

## 2018-06-15 DIAGNOSIS — F70 Mild intellectual disabilities: Secondary | ICD-10-CM | POA: Diagnosis present

## 2018-06-15 DIAGNOSIS — K219 Gastro-esophageal reflux disease without esophagitis: Secondary | ICD-10-CM | POA: Diagnosis present

## 2018-06-15 DIAGNOSIS — F603 Borderline personality disorder: Secondary | ICD-10-CM | POA: Diagnosis present

## 2018-06-15 DIAGNOSIS — F250 Schizoaffective disorder, bipolar type: Secondary | ICD-10-CM | POA: Diagnosis not present

## 2018-06-15 DIAGNOSIS — F4325 Adjustment disorder with mixed disturbance of emotions and conduct: Secondary | ICD-10-CM | POA: Diagnosis present

## 2018-06-15 DIAGNOSIS — E119 Type 2 diabetes mellitus without complications: Secondary | ICD-10-CM

## 2018-06-15 DIAGNOSIS — D509 Iron deficiency anemia, unspecified: Secondary | ICD-10-CM | POA: Diagnosis present

## 2018-06-15 DIAGNOSIS — Z87891 Personal history of nicotine dependence: Secondary | ICD-10-CM | POA: Diagnosis not present

## 2018-06-15 DIAGNOSIS — F209 Schizophrenia, unspecified: Secondary | ICD-10-CM | POA: Diagnosis present

## 2018-06-15 DIAGNOSIS — F329 Major depressive disorder, single episode, unspecified: Secondary | ICD-10-CM | POA: Diagnosis present

## 2018-06-15 DIAGNOSIS — R45850 Homicidal ideations: Secondary | ICD-10-CM | POA: Diagnosis present

## 2018-06-15 DIAGNOSIS — F79 Unspecified intellectual disabilities: Secondary | ICD-10-CM | POA: Diagnosis present

## 2018-06-15 LAB — GLUCOSE, CAPILLARY
Glucose-Capillary: 111 mg/dL — ABNORMAL HIGH (ref 70–99)
Glucose-Capillary: 207 mg/dL — ABNORMAL HIGH (ref 70–99)

## 2018-06-15 LAB — SARS CORONAVIRUS 2 BY RT PCR (HOSPITAL ORDER, PERFORMED IN ~~LOC~~ HOSPITAL LAB): SARS Coronavirus 2: NEGATIVE

## 2018-06-15 MED ORDER — METFORMIN HCL 500 MG PO TABS
500.0000 mg | ORAL_TABLET | Freq: Two times a day (BID) | ORAL | Status: DC
  Administered 2018-06-16 – 2018-06-20 (×9): 500 mg via ORAL
  Filled 2018-06-15 (×9): qty 1

## 2018-06-15 MED ORDER — PANTOPRAZOLE SODIUM 40 MG PO TBEC
40.0000 mg | DELAYED_RELEASE_TABLET | Freq: Every day | ORAL | Status: DC
  Administered 2018-06-16 – 2018-06-20 (×5): 40 mg via ORAL
  Filled 2018-06-15 (×5): qty 1

## 2018-06-15 MED ORDER — ATORVASTATIN CALCIUM 20 MG PO TABS
10.0000 mg | ORAL_TABLET | Freq: Every day | ORAL | Status: DC
  Administered 2018-06-16 – 2018-06-20 (×5): 10 mg via ORAL
  Filled 2018-06-15 (×5): qty 1

## 2018-06-15 MED ORDER — GABAPENTIN 600 MG PO TABS
600.0000 mg | ORAL_TABLET | Freq: Three times a day (TID) | ORAL | Status: DC
  Administered 2018-06-16 – 2018-06-20 (×14): 600 mg via ORAL
  Filled 2018-06-15 (×14): qty 1

## 2018-06-15 MED ORDER — LAMOTRIGINE 100 MG PO TABS
100.0000 mg | ORAL_TABLET | Freq: Every day | ORAL | Status: DC
  Administered 2018-06-16 – 2018-06-19 (×4): 100 mg via ORAL
  Filled 2018-06-15 (×5): qty 1

## 2018-06-15 MED ORDER — OLANZAPINE 5 MG PO TBDP: 10.0000 mg | ORAL_TABLET | Freq: Three times a day (TID) | ORAL | Status: DC | PRN

## 2018-06-15 MED ORDER — MAGNESIUM HYDROXIDE 400 MG/5ML PO SUSP: 30.0000 mL | Freq: Every day | ORAL | Status: DC | PRN

## 2018-06-15 MED ORDER — MIDAZOLAM HCL 2 MG/2ML IJ SOLN
2.0000 mg | Freq: Once | INTRAMUSCULAR | Status: AC
  Administered 2018-06-15: 2 mg via INTRAMUSCULAR
  Filled 2018-06-15: qty 2

## 2018-06-15 MED ORDER — PALIPERIDONE PALMITATE ER 234 MG/1.5ML IM SUSY
234.0000 mg | PREFILLED_SYRINGE | Freq: Once | INTRAMUSCULAR | Status: AC
  Administered 2018-06-16: 13:00:00 234 mg via INTRAMUSCULAR
  Filled 2018-06-15 (×2): qty 1.5

## 2018-06-15 MED ORDER — HALOPERIDOL LACTATE 5 MG/ML IJ SOLN
5.0000 mg | Freq: Once | INTRAMUSCULAR | Status: AC
  Administered 2018-06-15: 5 mg via INTRAMUSCULAR
  Filled 2018-06-15: qty 1

## 2018-06-15 MED ORDER — PALIPERIDONE ER 3 MG PO TB24
3.0000 mg | ORAL_TABLET | Freq: Two times a day (BID) | ORAL | Status: DC
  Administered 2018-06-15 (×2): 3 mg via ORAL
  Filled 2018-06-15 (×2): qty 1

## 2018-06-15 MED ORDER — LISINOPRIL 5 MG PO TABS
10.0000 mg | ORAL_TABLET | Freq: Every day | ORAL | Status: DC
  Administered 2018-06-15: 14:00:00 10 mg via ORAL
  Filled 2018-06-15: qty 1

## 2018-06-15 MED ORDER — LORAZEPAM 1 MG PO TABS: 1.0000 mg | ORAL_TABLET | ORAL | Status: DC | PRN

## 2018-06-15 MED ORDER — PALIPERIDONE PALMITATE ER 234 MG/1.5ML IM SUSY: 234.0000 mg | PREFILLED_SYRINGE | Freq: Once | INTRAMUSCULAR | Status: DC

## 2018-06-15 MED ORDER — LORATADINE 10 MG PO TABS
10.0000 mg | ORAL_TABLET | Freq: Every day | ORAL | Status: DC
  Administered 2018-06-15: 10 mg via ORAL
  Filled 2018-06-15: qty 1

## 2018-06-15 MED ORDER — DULOXETINE HCL 30 MG PO CPEP
60.0000 mg | ORAL_CAPSULE | Freq: Every day | ORAL | Status: DC
  Administered 2018-06-16 – 2018-06-20 (×5): 60 mg via ORAL
  Filled 2018-06-15 (×5): qty 2

## 2018-06-15 MED ORDER — PALIPERIDONE ER 3 MG PO TB24
3.0000 mg | ORAL_TABLET | Freq: Two times a day (BID) | ORAL | Status: DC
  Administered 2018-06-16: 3 mg via ORAL
  Filled 2018-06-15: qty 1

## 2018-06-15 MED ORDER — ATORVASTATIN CALCIUM 10 MG PO TABS
10.0000 mg | ORAL_TABLET | Freq: Every day | ORAL | Status: DC
  Administered 2018-06-15: 10 mg via ORAL
  Filled 2018-06-15: qty 1

## 2018-06-15 MED ORDER — PALIPERIDONE PALMITATE ER 156 MG/ML IM SUSY
156.0000 mg | PREFILLED_SYRINGE | INTRAMUSCULAR | Status: DC
  Filled 2018-06-15: qty 1

## 2018-06-15 MED ORDER — LEVOTHYROXINE SODIUM 150 MCG PO TABS
150.0000 ug | ORAL_TABLET | Freq: Every day | ORAL | Status: DC
  Filled 2018-06-15: qty 1

## 2018-06-15 MED ORDER — PALIPERIDONE PALMITATE ER 156 MG/ML IM SUSY: 156.0000 mg | PREFILLED_SYRINGE | INTRAMUSCULAR | Status: DC

## 2018-06-15 MED ORDER — FERROUS SULFATE 325 (65 FE) MG PO TABS
325.0000 mg | ORAL_TABLET | Freq: Two times a day (BID) | ORAL | Status: DC
  Administered 2018-06-16 – 2018-06-20 (×9): 325 mg via ORAL
  Filled 2018-06-15 (×9): qty 1

## 2018-06-15 MED ORDER — LORATADINE 10 MG PO TABS
10.0000 mg | ORAL_TABLET | Freq: Every day | ORAL | Status: DC
  Administered 2018-06-16 – 2018-06-20 (×5): 10 mg via ORAL
  Filled 2018-06-15 (×5): qty 1

## 2018-06-15 MED ORDER — LEVOTHYROXINE SODIUM 75 MCG PO TABS
150.0000 ug | ORAL_TABLET | Freq: Every day | ORAL | Status: DC
  Administered 2018-06-16 – 2018-06-20 (×5): 150 ug via ORAL
  Filled 2018-06-15 (×5): qty 2

## 2018-06-15 MED ORDER — ALUM & MAG HYDROXIDE-SIMETH 200-200-20 MG/5ML PO SUSP: 30.0000 mL | ORAL | Status: DC | PRN

## 2018-06-15 MED ORDER — PALIPERIDONE ER 3 MG PO TB24
3.0000 mg | ORAL_TABLET | Freq: Every day | ORAL | Status: DC
  Administered 2018-06-15: 3 mg via ORAL

## 2018-06-15 MED ORDER — LAMOTRIGINE 100 MG PO TABS
100.0000 mg | ORAL_TABLET | Freq: Every day | ORAL | Status: DC
  Administered 2018-06-15: 100 mg via ORAL
  Filled 2018-06-15: qty 1

## 2018-06-15 MED ORDER — DULOXETINE HCL 60 MG PO CPEP
60.0000 mg | ORAL_CAPSULE | Freq: Every day | ORAL | Status: DC
  Administered 2018-06-15: 60 mg via ORAL
  Filled 2018-06-15: qty 1

## 2018-06-15 MED ORDER — PALIPERIDONE ER 3 MG PO TB24: 3.0000 mg | ORAL_TABLET | Freq: Every day | ORAL | Status: DC

## 2018-06-15 MED ORDER — PANTOPRAZOLE SODIUM 40 MG PO TBEC
40.0000 mg | DELAYED_RELEASE_TABLET | Freq: Every day | ORAL | Status: DC
  Administered 2018-06-15: 14:00:00 40 mg via ORAL
  Filled 2018-06-15: qty 1

## 2018-06-15 MED ORDER — TRAZODONE HCL 100 MG PO TABS
100.0000 mg | ORAL_TABLET | Freq: Every day | ORAL | Status: DC
  Administered 2018-06-16 – 2018-06-19 (×4): 100 mg via ORAL
  Filled 2018-06-15 (×4): qty 1

## 2018-06-15 MED ORDER — CARVEDILOL 6.25 MG PO TABS
6.2500 mg | ORAL_TABLET | Freq: Two times a day (BID) | ORAL | Status: DC
  Administered 2018-06-16 – 2018-06-20 (×9): 6.25 mg via ORAL
  Filled 2018-06-15 (×9): qty 1

## 2018-06-15 MED ORDER — GABAPENTIN 600 MG PO TABS
600.0000 mg | ORAL_TABLET | Freq: Three times a day (TID) | ORAL | Status: DC
  Administered 2018-06-15 (×2): 600 mg via ORAL
  Filled 2018-06-15 (×3): qty 1

## 2018-06-15 MED ORDER — FERROUS SULFATE 325 (65 FE) MG PO TABS
325.0000 mg | ORAL_TABLET | Freq: Two times a day (BID) | ORAL | Status: DC
  Administered 2018-06-15 (×2): 325 mg via ORAL
  Filled 2018-06-15 (×2): qty 1

## 2018-06-15 MED ORDER — LISINOPRIL 20 MG PO TABS
10.0000 mg | ORAL_TABLET | Freq: Every day | ORAL | Status: DC
  Administered 2018-06-16 – 2018-06-20 (×4): 10 mg via ORAL
  Filled 2018-06-15 (×5): qty 1

## 2018-06-15 MED ORDER — CARVEDILOL 6.25 MG PO TABS
6.2500 mg | ORAL_TABLET | Freq: Two times a day (BID) | ORAL | Status: DC
  Administered 2018-06-15: 6.25 mg via ORAL
  Filled 2018-06-15: qty 1

## 2018-06-15 MED ORDER — METFORMIN HCL 500 MG PO TABS
500.0000 mg | ORAL_TABLET | Freq: Two times a day (BID) | ORAL | Status: DC
  Administered 2018-06-15 (×2): 500 mg via ORAL
  Filled 2018-06-15 (×2): qty 1

## 2018-06-15 MED ORDER — ZIPRASIDONE MESYLATE 20 MG IM SOLR: 20.0000 mg | INTRAMUSCULAR | Status: DC | PRN

## 2018-06-15 MED ORDER — TRAZODONE HCL 100 MG PO TABS
100.0000 mg | ORAL_TABLET | Freq: Every day | ORAL | Status: DC
  Administered 2018-06-15: 100 mg via ORAL
  Filled 2018-06-15: qty 1

## 2018-06-15 MED ORDER — ACETAMINOPHEN 325 MG PO TABS: 650.0000 mg | ORAL_TABLET | Freq: Four times a day (QID) | ORAL | Status: DC | PRN

## 2018-06-15 NOTE — ED Notes (Signed)
Pt given meal tray.

## 2018-06-15 NOTE — ED Notes (Signed)
Only 3mg  of paliperidone administered
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
## 2018-06-15 NOTE — ED Notes (Signed)
COVID 19 swab completed at 1800

## 2018-06-15 NOTE — ED Notes (Signed)
ED  Is the patient under IVC or is there intent for IVC: voluntary Is the patient medically cleared: Yes.   Is there vacancy in the ED BHU: Yes.   Is the population mix appropriate for patient: Yes.   Is the patient awaiting placement in inpatient or outpatient setting: Yes.  Waiting to go to [facility]  Has the patient had a psychiatric consult: Yes.   Survey of unit performed for contraband, proper placement and condition of furniture, tampering with fixtures in bathroom, shower, and each patient room: Yes.  ; Findings:  APPEARANCE/BEHAVIOR Calm and cooperative NEURO ASSESSMENT Orientation: oriented x3  Denies pain Hallucinations: No.None noted (Hallucinations) denies at present Speech: Normal Gait: normal RESPIRATORY ASSESSMENT Even  Unlabored respirations  CARDIOVASCULAR ASSESSMENT Pulses equal   regular rate  Skin warm and dry   GASTROINTESTINAL ASSESSMENT no GI complaint EXTREMITIES Full ROM  PLAN OF CARE Provide calm/safe environment. Vital signs assessed twice daily. ED BHU Assessment once each 12-hour shift. Collaborate with TTS daily or as condition indicates. Assure the ED provider has rounded once each shift. Provide and encourage hygiene. Provide redirection as needed. Assess for escalating behavior; address immediately and inform ED provider.  Assess family dynamic and appropriateness for visitation as needed: Yes.  ; If necessary, describe findings:  Educate the patient/family about BHU procedures/visitation: Yes.  ; If necessary, describe findings:
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
## 2018-06-15 NOTE — ED Notes (Signed)
Patient in shower at this time. 

## 2018-06-15 NOTE — Consult Note (Signed)
[place] Psychiatry Consult   Reason for Consult: Aggressive behavior Referring Physician: Dr. Jimmye Norman Patient Identification: CHIANN GOFFREDO MRN:  [id number] Principal Diagnosis: Homicidal ideation Diagnosis:  Principal Problem:   Homicidal ideation   Total Time spent with patient: 45 minutes  Subjective: "I do not want you to call [name] at my group home and I do not want you to call my family.  I am a guardian and I do not want you to talk to anybody."  JADA FASS is a 53 y.o. female patient presented to [facility] ED via law enforcement under involuntary commitment status (IVC).  "I hate [name] and I do not like my roommate.  I wish she and [name] both could die."  "I do not want to go back over there.  I do not want you to call my group home or anybody in my family.  I am my own guardian." The patient was seen face-to-face by this provider; chart reviewed and consulted with Dr. Jimmye Norman on 06/14/2018 due to the care of the patient. It was discussed with the provider that the patient does not meet criteria to be admitted to the inpatient unit. Due to the patient having multiple ED visits with the same complaints/issues.  She does not meet criteria for inpatient psychiatric admission. On evaluation the patient is alert and oriented x3, calm and cooperative, and mood-congruent with affect. "I like when I am here." The patient does not appear to be responding to internal or external stimuli, but admits to experiencing those stimuli's.  Neither is the patient presenting with any delusional thinking. The patient admits auditory hallucinations  The patient denies any suicidal or self-harm ideations, but admits to homicidal ideations. The patient states "I want to hurt [name] (group home staff) and my roommate because they do not like me."  The patient is not presenting with any psychotic or paranoid behaviors. During an encounter with the patient she was able to answer questions  appropriately. While the patient comments are subjective reports Auditory hallucinations, she does NOT appear to be responding to internal stimuli at all.     Collateral was not obtained due to no answer when this provider called. Ms. Allene Pyo [phone]  Plan: The patient is not a safety risk to self or others and does not require psychiatric inpatient admission for stabilization and treatment.   HPI: Per Dr. Jimmye Norman; LYNNETTA TOM is a 53 y.o. female with a history of anxiety, depression, diabetes, GERD, hypertension, borderline personality disorder, schizophrenia who presents to the ED for involuntary commitment.  She arrives from a group home under commitment by [employer].  She has been communicating threats of violence towards her roommate at the group home.  She denies any other complaints at this time.  Past Psychiatric History: Anxiety Depression  Risk to Self:  No Risk to Others:  No Prior Inpatient Therapy:  Yes Prior Outpatient Therapy:  Yes  Past Medical History:  Past Medical History:  Diagnosis Date  . Anxiety   . Depression   . Diabetes mellitus without complication ([place])   . GERD (gastroesophageal reflux disease)   . Hypertension    History reviewed. No pertinent surgical history. Family History: No family history on file. Family Psychiatric  History:  None disclosed  Social History:  Social History   Substance and Sexual Activity  Alcohol Use No  . Frequency: Never     Social History   Substance and Sexual Activity  Drug Use Yes  .  Types: Marijuana, "Crack" cocaine   Comment: reports she has not done anything in a long time    Social History   Socioeconomic History  . Marital status: Single    Spouse name: Not on file  . Number of children: Not on file  . Years of education: Not on file  . Highest education level: Not on file  Occupational History  . Not on file  Social Needs  . Financial resource strain: Not on  file  . Food insecurity:    Worry: Not on file    Inability: Not on file  . Transportation needs:    Medical: Not on file    Non-medical: Not on file  Tobacco Use  . Smoking status: Former Research scientist (life sciences)  . Smokeless tobacco: Never Used  Substance and Sexual Activity  . Alcohol use: No    Frequency: Never  . Drug use: Yes    Types: Marijuana, "Crack" cocaine    Comment: reports she has not done anything in a long time  . Sexual activity: Not on file  Lifestyle  . Physical activity:    Days per week: Not on file    Minutes per session: Not on file  . Stress: Not on file  Relationships  . Social connections:    Talks on phone: Not on file    Gets together: Not on file    Attends religious service: Not on file    Active member of club or organization: Not on file    Attends meetings of clubs or organizations: Not on file    Relationship status: Not on file  Other Topics Concern  . Not on file  Social History Narrative  . Not on file   Additional Social History:    Allergies:   Allergies  Allergen Reactions  . Abilify [Aripiprazole] Other (See Comments)    Chokes on food  . Haldol [Haloperidol] Other (See Comments)    Dizziness  . Risperdal [Risperidone] Other (See Comments)    Leg weakness    Labs:  Results for orders placed or performed during the hospital encounter of 06/14/18 (from the past 48 hour(s))  CBC with Differential/Platelet     Status: Abnormal   Collection Time: 06/14/18  9:32 PM  Result Value Ref Range   WBC 5.8 4.0 - 10.5 K/uL   RBC 4.11 3.87 - 5.11 MIL/uL   Hemoglobin 12.5 12.0 - 15.0 g/dL   HCT 38.7 36.0 - 46.0 %   MCV 94.2 80.0 - 100.0 fL   MCH 30.4 26.0 - 34.0 pg   MCHC 32.3 30.0 - 36.0 g/dL   RDW 12.5 11.5 - 15.5 %   Platelets 80 (L) 150 - 400 K/uL    Comment: Immature Platelet Fraction may be clinically indicated, consider ordering this additional test [id number]    nRBC 0.0 0.0 - 0.2 %   Neutrophils Relative % 45 %   Neutro Abs 2.5 1.7 -  7.7 K/uL   Lymphocytes Relative 40 %   Lymphs Abs 2.3 0.7 - 4.0 K/uL   Monocytes Relative 9 %   Monocytes Absolute 0.5 0.1 - 1.0 K/uL   Eosinophils Relative 6 %   Eosinophils Absolute 0.4 0.0 - 0.5 K/uL   Basophils Relative 0 %   Basophils Absolute 0.0 0.0 - 0.1 K/uL   Immature Granulocytes 0 %   Abs Immature Granulocytes 0.02 0.00 - 0.07 K/uL    Comment: Performed at [facility], [address] [place], [place] [address]  Comprehensive  metabolic panel     Status: Abnormal   Collection Time: 06/14/18  9:32 PM  Result Value Ref Range   Sodium 137 135 - 145 mmol/L   Potassium 4.0 3.5 - 5.1 mmol/L   Chloride 100 98 - 111 mmol/L   CO2 27 22 - 32 mmol/L   Glucose, Bld 175 (H) 70 - 99 mg/dL   BUN 20 6 - 20 mg/dL   Creatinine, Ser 0.61 0.44 - 1.00 mg/dL   Calcium 7.8 (L) 8.9 - 10.3 mg/dL   Total Protein 7.5 6.5 - 8.1 g/dL   Albumin 4.3 3.5 - 5.0 g/dL   AST 29 15 - 41 U/L   ALT 33 0 - 44 U/L   Alkaline Phosphatase 89 38 - 126 U/L   Total Bilirubin 0.5 0.3 - 1.2 mg/dL   GFR calc non Af Amer >60 >60 mL/min   GFR calc Af Amer >60 >60 mL/min   Anion gap 10 5 - 15    Comment: Performed at [facility], [address], [place], [place] [address]  Urine Drug Screen, Qualitative ([name] only)     Status: None   Collection Time: 06/14/18  9:32 PM  Result Value Ref Range   Tricyclic, Ur Screen NONE DETECTED NONE DETECTED   Amphetamines, Ur Screen NONE DETECTED NONE DETECTED   MDMA (Ecstasy)Ur Screen NONE DETECTED NONE DETECTED   Cocaine Metabolite,Ur [place] NONE DETECTED NONE DETECTED   Opiate, Ur Screen NONE DETECTED NONE DETECTED   Phencyclidine (PCP) Ur S NONE DETECTED NONE DETECTED   Cannabinoid 50 Ng, Ur [place] NONE DETECTED NONE DETECTED   Barbiturates, Ur Screen NONE DETECTED NONE DETECTED   Benzodiazepine, Ur Scrn NONE DETECTED NONE DETECTED   Methadone Scn, Ur NONE DETECTED NONE DETECTED    Comment: (NOTE) Tricyclics + metabolites, urine    Cutoff 1000  ng/mL Amphetamines + metabolites, urine  Cutoff 1000 ng/mL MDMA (Ecstasy), urine              Cutoff 500 ng/mL Cocaine Metabolite, urine          Cutoff 300 ng/mL Opiate + metabolites, urine        Cutoff 300 ng/mL Phencyclidine (PCP), urine         Cutoff 25 ng/mL Cannabinoid, urine                 Cutoff 50 ng/mL Barbiturates + metabolites, urine  Cutoff 200 ng/mL Benzodiazepine, urine              Cutoff 200 ng/mL Methadone, urine                   Cutoff 300 ng/mL The urine drug screen provides only a preliminary, unconfirmed analytical test result and should not be used for non-medical purposes. Clinical consideration and professional judgment should be applied to any positive drug screen result due to possible interfering substances. A more specific alternate chemical method must be used in order to obtain a confirmed analytical result. Gas chromatography / mass spectrometry (GC/MS) is the preferred confirmat ory method. Performed at [facility], [address] [place], [place] [address]   Ethanol     Status: None   Collection Time: 06/14/18  9:32 PM  Result Value Ref Range   Alcohol, Ethyl (B) <10 <10 mg/dL    Comment: (NOTE) Lowest detectable limit for serum alcohol is 10 mg/dL. For medical purposes only. Performed at [facility], [address] [place], [place] [address]  No current facility-administered medications for this encounter.    Current Outpatient Medications  Medication Sig Dispense Refill  . atorvastatin (LIPITOR) 10 MG tablet Take 1 tablet (10 mg total) by mouth daily. 30 tablet 0  . carvedilol (COREG) 6.25 MG tablet Take 1 tablet (6.25 mg total) by mouth 2 (two) times daily. 60 tablet 0  . DULoxetine (CYMBALTA) 60 MG capsule Take 1 capsule (60 mg total) by mouth daily. 30 capsule 0  . ferrous sulfate (FEROSUL) 325 (65 FE) MG tablet Take 1 tablet (325 mg total) by mouth 2 (two) times daily. 60 tablet 0  . gabapentin  (NEURONTIN) 600 MG tablet Take 1 tablet (600 mg total) by mouth 3 (three) times daily. 90 tablet 0  . insulin detemir (LEVEMIR) 100 UNIT/ML injection Inject 0.15 mLs (15 Units total) into the skin 2 (two) times daily. 10 mL 1  . lamoTRIgine (LAMICTAL) 100 MG tablet Take 1 tablet (100 mg total) by mouth at bedtime. 30 tablet 0  . levothyroxine (SYNTHROID) 150 MCG tablet Take 1 tablet (150 mcg total) by mouth daily at 6 (six) AM. 30 tablet 0  . lisinopril (ZESTRIL) 10 MG tablet Take 1 tablet (10 mg total) by mouth daily. 30 tablet 0  . loratadine (CLARITIN) 10 MG tablet Take 1 tablet (10 mg total) by mouth daily. 30 tablet 0  . Lurasidone HCl 120 MG TABS Take 1 tablet (120 mg total) by mouth daily with breakfast. 30 tablet 0  . metFORMIN (GLUCOPHAGE) 500 MG tablet Take 1 tablet (500 mg total) by mouth 2 (two) times daily. 60 tablet 0  . pantoprazole (PROTONIX) 40 MG tablet Take 1 tablet (40 mg total) by mouth daily. 30 tablet 0  . traZODone (DESYREL) 100 MG tablet Take 1 tablet (100 mg total) by mouth at bedtime. 30 tablet 0    Musculoskeletal: Strength & Muscle Tone: within normal limits Gait & Station: normal Patient leans: N/A  Psychiatric Specialty Exam: Physical Exam  Nursing note and vitals reviewed. Constitutional: She is oriented to person, place, and time. She appears well-developed and well-nourished.  HENT:  Head: Normocephalic and atraumatic.  Eyes: Pupils are equal, round, and reactive to light. Conjunctivae and EOM are normal.  Neck: Normal range of motion. Neck supple.  Cardiovascular: Normal rate and regular rhythm.  Respiratory: Effort normal and breath sounds normal.  Musculoskeletal: Normal range of motion.  Neurological: She is alert and oriented to person, place, and time. She has normal reflexes.  Skin: Skin is warm and dry.    Review of Systems  Psychiatric/Behavioral: Positive for depression. The patient is nervous/anxious.   All other systems reviewed and are  negative.   Blood pressure (!) 121/93, pulse 90, temperature 98.1 F (36.7 C), temperature source Oral, resp. rate 18, height 4\' 10"  (1.473 m), weight 69 kg, SpO2 95 %.Body mass index is 31.79 kg/m.  General Appearance: Fairly Groomed  Eye Contact:  Fair  Speech:  Normal Rate  Volume:  Normal  Mood:  Anxious and Depressed  Affect:  Congruent  Thought Process:  Coherent  Orientation:  Full (Time, Place, and Person)  Thought Content:  Illogical  Suicidal Thoughts:  No  Homicidal Thoughts:  Yes.  without intent/plan  Memory:  Immediate;   Good Recent;   Good  Judgement:  Impaired  Insight:  Lacking  Psychomotor Activity:  Normal  Concentration:  Concentration: Fair and Attention Span: Fair  Recall:  Good  Fund of Knowledge:  Fair  Language:  Good  Akathisia:  NA  Handed:  Right  AIMS (if indicated):     Assets:  Desire for Improvement Housing Social Support  ADL's:  Intact  Cognition:  Impaired,  Mild  Sleep:   Good     Treatment Plan Summary: Daily contact with patient to assess and evaluate symptoms and progress in treatment and Medication management  Disposition: No evidence of imminent risk to self or others at present.   Patient does not meet criteria for psychiatric inpatient admission. Supportive therapy provided about ongoing stressors.  Lamont Dowdy, NP 06/15/2018 3:23 AM

## 2018-06-15 NOTE — Consult Note (Signed)
[facility] Consult   Reason for Consult: Aggressive behavior Referring Physician: Dr. Jimmye Norman Patient Identification: Jessica [name] MRN:  [id number] Principal Diagnosis: Homicidal ideation Diagnosis:  Principal Problem:   Homicidal ideation   Total Time spent with patient: 1 hour  Subjective: "If I go back to my group home, I will definitely be hurting Margreta Journey."  Jessica [name] is a 53 y.o. female patient presented with a history of anxiety, depression, diabetes, GERD, hypertension, borderline personality disorder, schizophrenia who presents to the ED for involuntary commitment.  She arrives from a group home under commitment by [employer].  She has been communicating threats of violence towards her roommate at the group home.  On arrival, patient is stating, "I hate [name] and I do not like my roommate.  I wish she and [name] both could die."  "I do not want to go back over there.  I do not want you to call my group home or anybody in my family.  I am my own guardian." The patient does not appear to be responding to internal or external stimuli, but admits to experiencing those stimuli's.  Neither is the patient presenting with any delusional thinking. The patient admits auditory hallucinations  The patient denies any suicidal or self-harm ideations, but admits to homicidal ideations. The patient states "I want to hurt [name] (group home staff) and my roommate because they do not like me."    On reevaluation this morning, patient continues to be agitated.  She reports that she is still her own guardian, and would like to find another place to live, however she feels that she is unable to do this on her own.  She states that she has a poor relationship with her sister, who is the only other person who could possibly help her.  She initially request that we contact her sister, but then changes her mind.  Patient continues to endorse homicidal ideation  towards her roommate.  She is not able to contract for safety.  Patient denies suicidal ideation.  She continues to endorse auditory and visual hallucinations.  Patient reports that she has, "not felt well with the last medication changes while in the hospital."  She wonders if lithium would be a better medication for her over [place].  Discussed with patient possibility of long-acting injectable antipsychotic for better mood stabilization, and patient is agreeable to trial.   Collateral obtained from group home owner, Ms. Allene Pyo [phone]: Ms. Oswaldo Milian states that patient was just discharged last week from [facility], however she continued to have irritable behaviors from the time she arrived back.  Ms. Oswaldo Milian states that patient had previously done better on twice a day dosing of Latuda, but she has not done well at a larger dose given once a day.  Ms. Sherle Poe states that patient also had an appointment scheduled at [facility] yesterday, however this was just an intake appointment and she did not see a psychiatrist.  After returning from her appointment, patient began to call staff members [place] and mother factors.  She began swinging at her roommate, and Ms. Watlington was forced to evacuate other residents out of the house in order to maintain their safety.  Police were called and Ms. Waguespack continued to be swinging while Agricultural engineer were present.  Ms. Oswaldo Milian states that patient has been under stress regarding up coming court cases.  She states that patient has a court date for June 9 for assault charges, and a guardianship  hearing for June 14.  Ms. Oswaldo Milian states that patient is welcome to return to her home following medication adjustments.   Past Psychiatric History: Schizoaffective disorder, Anxiety, Depression  Risk to Self:  No Risk to Others:  Yes Prior Inpatient Therapy:  Yes, last discharged 06/07/2018 Prior Outpatient Therapy:  Yes  Past Medical History:   Past Medical History:  Diagnosis Date  . Anxiety   . Depression   . Diabetes mellitus without complication ([place])   . GERD (gastroesophageal reflux disease)   . Hypertension    History reviewed. No pertinent surgical history. Family History: No family history on file. Family Psychiatric  History: See previous H&P None disclosed   Social History:  Social History   Substance and Sexual Activity  Alcohol Use No  . Frequency: Never     Social History   Substance and Sexual Activity  Drug Use Yes  . Types: Marijuana, "Crack" cocaine   Comment: reports she has not done anything in a long time    Social History   Socioeconomic History  . Marital status: Single    Spouse name: Not on file  . Number of children: Not on file  . Years of education: Not on file  . Highest education level: Not on file  Occupational History  . Not on file  Social Needs  . Financial resource strain: Not on file  . Food insecurity:    Worry: Not on file    Inability: Not on file  . Transportation needs:    Medical: Not on file    Non-medical: Not on file  Tobacco Use  . Smoking status: Former Research scientist (life sciences)  . Smokeless tobacco: Never Used  Substance and Sexual Activity  . Alcohol use: No    Frequency: Never  . Drug use: Yes    Types: Marijuana, "Crack" cocaine    Comment: reports she has not done anything in a long time  . Sexual activity: Not on file  Lifestyle  . Physical activity:    Days per week: Not on file    Minutes per session: Not on file  . Stress: Not on file  Relationships  . Social connections:    Talks on phone: Not on file    Gets together: Not on file    Attends religious service: Not on file    Active member of club or organization: Not on file    Attends meetings of clubs or organizations: Not on file    Relationship status: Not on file  Other Topics Concern  . Not on file  Social History Narrative  . Not on file   Additional Social History:  Lives at group home  of Ms. Arizona Constable, where she has had behavioral issues off and on.  Guardianship hearing set for July 03, 2018 which patient is distressed about.  Court hearing for July 08, 2018 for assault charges  Allergies:   Allergies  Allergen Reactions  . Abilify [Aripiprazole] Other (See Comments)    Chokes on food  . Haldol [Haloperidol] Other (See Comments)    Dizziness  . Risperdal [Risperidone] Other (See Comments)    Leg weakness    Labs:  Results for orders placed or performed during the hospital encounter of 06/14/18 (from the past 48 hour(s))  CBC with Differential/Platelet     Status: Abnormal   Collection Time: 06/14/18  9:32 PM  Result Value Ref Range   WBC 5.8 4.0 - 10.5 K/uL   RBC 4.11 3.87 - 5.11 MIL/uL  Hemoglobin 12.5 12.0 - 15.0 g/dL   HCT 38.7 36.0 - 46.0 %   MCV 94.2 80.0 - 100.0 fL   MCH 30.4 26.0 - 34.0 pg   MCHC 32.3 30.0 - 36.0 g/dL   RDW 12.5 11.5 - 15.5 %   Platelets 80 (L) 150 - 400 K/uL    Comment: Immature Platelet Fraction may be clinically indicated, consider ordering this additional test [id number]    nRBC 0.0 0.0 - 0.2 %   Neutrophils Relative % 45 %   Neutro Abs 2.5 1.7 - 7.7 K/uL   Lymphocytes Relative 40 %   Lymphs Abs 2.3 0.7 - 4.0 K/uL   Monocytes Relative 9 %   Monocytes Absolute 0.5 0.1 - 1.0 K/uL   Eosinophils Relative 6 %   Eosinophils Absolute 0.4 0.0 - 0.5 K/uL   Basophils Relative 0 %   Basophils Absolute 0.0 0.0 - 0.1 K/uL   Immature Granulocytes 0 %   Abs Immature Granulocytes 0.02 0.00 - 0.07 K/uL    Comment: Performed at [facility], [address] [place], [place] [address]  Comprehensive metabolic panel     Status: Abnormal   Collection Time: 06/14/18  9:32 PM  Result Value Ref Range   Sodium 137 135 - 145 mmol/L   Potassium 4.0 3.5 - 5.1 mmol/L   Chloride 100 98 - 111 mmol/L   CO2 27 22 - 32 mmol/L   Glucose, Bld 175 (H) 70 - 99 mg/dL   BUN 20 6 - 20 mg/dL   Creatinine, Ser 0.61 0.44 - 1.00 mg/dL    Calcium 7.8 (L) 8.9 - 10.3 mg/dL   Total Protein 7.5 6.5 - 8.1 g/dL   Albumin 4.3 3.5 - 5.0 g/dL   AST 29 15 - 41 U/L   ALT 33 0 - 44 U/L   Alkaline Phosphatase 89 38 - 126 U/L   Total Bilirubin 0.5 0.3 - 1.2 mg/dL   GFR calc non Af Amer >60 >60 mL/min   GFR calc Af Amer >60 >60 mL/min   Anion gap 10 5 - 15    Comment: Performed at [facility], [address], [place], [place] [address]  Urine Drug Screen, Qualitative ([name] only)     Status: None   Collection Time: 06/14/18  9:32 PM  Result Value Ref Range   Tricyclic, Ur Screen NONE DETECTED NONE DETECTED   Amphetamines, Ur Screen NONE DETECTED NONE DETECTED   MDMA (Ecstasy)Ur Screen NONE DETECTED NONE DETECTED   Cocaine Metabolite,Ur [place] NONE DETECTED NONE DETECTED   Opiate, Ur Screen NONE DETECTED NONE DETECTED   Phencyclidine (PCP) Ur S NONE DETECTED NONE DETECTED   Cannabinoid 50 Ng, Ur [place] NONE DETECTED NONE DETECTED   Barbiturates, Ur Screen NONE DETECTED NONE DETECTED   Benzodiazepine, Ur Scrn NONE DETECTED NONE DETECTED   Methadone Scn, Ur NONE DETECTED NONE DETECTED    Comment: (NOTE) Tricyclics + metabolites, urine    Cutoff 1000 ng/mL Amphetamines + metabolites, urine  Cutoff 1000 ng/mL MDMA (Ecstasy), urine              Cutoff 500 ng/mL Cocaine Metabolite, urine          Cutoff 300 ng/mL Opiate + metabolites, urine        Cutoff 300 ng/mL Phencyclidine (PCP), urine         Cutoff 25 ng/mL Cannabinoid, urine                 Cutoff 50 ng/mL  Barbiturates + metabolites, urine  Cutoff 200 ng/mL Benzodiazepine, urine              Cutoff 200 ng/mL Methadone, urine                   Cutoff 300 ng/mL The urine drug screen provides only a preliminary, unconfirmed analytical test result and should not be used for non-medical purposes. Clinical consideration and professional judgment should be applied to any positive drug screen result due to possible interfering substances. A more specific alternate chemical  method must be used in order to obtain a confirmed analytical result. Gas chromatography / mass spectrometry (GC/MS) is the preferred confirmat ory method. Performed at [facility], [address] [place], [place] [address]   Ethanol     Status: None   Collection Time: 06/14/18  9:32 PM  Result Value Ref Range   Alcohol, Ethyl (B) <10 <10 mg/dL    Comment: (NOTE) Lowest detectable limit for serum alcohol is 10 mg/dL. For medical purposes only. Performed at [facility], [address] [place],  [address]     No current facility-administered medications for this encounter.    Current Outpatient Medications  Medication Sig Dispense Refill  . atorvastatin (LIPITOR) 10 MG tablet Take 1 tablet (10 mg total) by mouth daily. 30 tablet 0  . carvedilol (COREG) 6.25 MG tablet Take 1 tablet (6.25 mg total) by mouth 2 (two) times daily. 60 tablet 0  . DULoxetine (CYMBALTA) 60 MG capsule Take 1 capsule (60 mg total) by mouth daily. 30 capsule 0  . ferrous sulfate (FEROSUL) 325 (65 FE) MG tablet Take 1 tablet (325 mg total) by mouth 2 (two) times daily. 60 tablet 0  . gabapentin (NEURONTIN) 600 MG tablet Take 1 tablet (600 mg total) by mouth 3 (three) times daily. 90 tablet 0  . insulin detemir (LEVEMIR) 100 UNIT/ML injection Inject 0.15 mLs (15 Units total) into the skin 2 (two) times daily. 10 mL 1  . lamoTRIgine (LAMICTAL) 100 MG tablet Take 1 tablet (100 mg total) by mouth at bedtime. 30 tablet 0  . levothyroxine (SYNTHROID) 150 MCG tablet Take 1 tablet (150 mcg total) by mouth daily at 6 (six) AM. 30 tablet 0  . lisinopril (ZESTRIL) 10 MG tablet Take 1 tablet (10 mg total) by mouth daily. 30 tablet 0  . loratadine (CLARITIN) 10 MG tablet Take 1 tablet (10 mg total) by mouth daily. 30 tablet 0  . Lurasidone HCl 120 MG TABS Take 1 tablet (120 mg total) by mouth daily with breakfast. 30 tablet 0  . metFORMIN (GLUCOPHAGE) 500 MG tablet Take 1 tablet (500 mg total)  by mouth 2 (two) times daily. 60 tablet 0  . pantoprazole (PROTONIX) 40 MG tablet Take 1 tablet (40 mg total) by mouth daily. 30 tablet 0  . traZODone (DESYREL) 100 MG tablet Take 1 tablet (100 mg total) by mouth at bedtime. 30 tablet 0    Musculoskeletal: Strength & Muscle Tone: within normal limits Gait & Station: normal Patient leans: N/A  Psychiatric Specialty Exam: Physical Exam  Nursing note and vitals reviewed. Constitutional: She is oriented to person, place, and time. She appears well-developed and well-nourished.  HENT:  Head: Normocephalic and atraumatic.  Eyes: Pupils are equal, round, and reactive to light. Conjunctivae and EOM are normal.  Neck: Normal range of motion. Neck supple.  Cardiovascular: Normal rate and regular rhythm.  Respiratory: Effort normal and breath sounds normal.  Musculoskeletal: Normal range  of motion.  Neurological: She is alert and oriented to person, place, and time. She has normal reflexes.  Skin: Skin is warm and dry.  Psychiatric: Her speech is normal. Her mood appears anxious. Her affect is labile and inappropriate. She is agitated. Thought content is paranoid. Cognition and memory are normal. She expresses impulsivity and inappropriate judgment. She expresses homicidal ideation. She expresses no suicidal ideation. She expresses homicidal plans.    Review of Systems  Constitutional: Negative.   HENT: Negative.   Respiratory: Negative.   Cardiovascular: Negative.   Gastrointestinal: Negative.   Musculoskeletal: Negative.   Psychiatric/Behavioral: Positive for depression and hallucinations. Negative for memory loss, substance abuse and suicidal ideas. The patient is nervous/anxious. The patient does not have insomnia.   All other systems reviewed and are negative.   Blood pressure (!) 121/93, pulse 90, temperature 98.1 F (36.7 C), temperature source Oral, resp. rate 18, height 4\' 10"  (1.473 m), weight 69 kg, SpO2 95 %.Body mass index is  31.79 kg/m.  General Appearance: Fairly Groomed  Eye Contact:  Fair  Speech:  Normal Rate  Volume:  Normal  Mood:  Anxious, Depressed and Irritable  Affect:  Congruent and Constricted  Thought Process:  Goal Directed and Descriptions of Associations: Tangential  Orientation:  Full (Time, Place, and Person)  Thought Content:  Illogical  Suicidal Thoughts:  No  Homicidal Thoughts:  Yes.  with intent/plan  Memory:  Immediate;   Good Recent;   Good  Judgement:  Impaired  Insight:  Shallow  Psychomotor Activity:  Normal  Concentration:  Concentration: Fair and Attention Span: Fair  Recall:  Good  Fund of Knowledge:  Fair  Language:  Good  Akathisia:  NA  Handed:  Right  AIMS (if indicated):     Assets:  Desire for Improvement Housing Social Support  ADL's:  Intact  Cognition:  Impaired,  Mild  Sleep:   Good    Treatment Plan Summary: Continue involuntary commitment Daily contact with patient to assess and evaluate symptoms and progress in treatment and Medication management Discontinue Latuda Start Invega 3 mg twice daily for 2 days, then 3 mg at bedtime for 2 to 3 weeks while patient stabilizes on long-acting injectable. Lorayne Bender Sustenna 234 mg IM for 1 dose on 06/16/2018 followed by Lorayne Bender Sustenna 156 mg on 06/20/2018 then every 28 days with dose to be determined by outpatient psychiatrist. Restart Cymbalta 60 mg daily for depression Restart Lamictal 100 mg daily at bedtime for mood stabilization Restart gabapentin 600 mg 3 times daily for mood stabilization restart trazodone 100 mg daily at bedtime for sleep Restart home medications Lipitor, iron, Glucophage, Zestril, Synthroid, Protonix at usual doses.  Her group home owner, patient does not have blood sugar problems with adequate diet.  She has not been receiving insulin at the group home.  Last hemoglobin A1c 5.3.   Disposition: Recommend psychiatric Inpatient admission when medically cleared. Supportive therapy provided  about ongoing stressors.  Admission orders placed.  Lavella Hammock, MD 06/15/2018 9:55 AM

## 2018-06-15 NOTE — BH Assessment (Signed)
Patient is to be admitted to [facility] by Dr. Leverne Humbles.  Attending Physician will be Dr. Weber Cooks.   Patient has been assigned to room 310, by [facility] Charge Nurse Demetria.   ER staff is aware of the admission:  Vaughan Basta, ER Secretary    Dr. Cinda Quest, ER MD   Donneta Romberg, Patient's Nurse   Butch Penny, Patient Access.

## 2018-06-15 NOTE — ED Notes (Signed)

## 2018-06-15 NOTE — ED Notes (Signed)
Patient is now tearful, and apologizing for her behavior.

## 2018-06-15 NOTE — ED Notes (Signed)
Writer came in to get report from [facility] and patient began to say "I dont like you and I am not taking my medicine". I dont want to talk to [place], I dont like black people. Patient is now sitting on her bed, quietly.

## 2018-06-15 NOTE — ED Notes (Signed)
BEHAVIORAL HEALTH ROUNDING Patient sleeping: No. Patient alert and oriented: yes Behavior appropriate: Yes.  ; If no, describe:  Nutrition and fluids offered: yes Toileting and hygiene offered: Yes  Sitter present: q15 minute observations and security  monitoring Law enforcement present: Yes  ODS  
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
## 2018-06-15 NOTE — ED Notes (Signed)
BEHAVIORAL HEALTH ROUNDING Patient sleeping: No. Patient alert and oriented: yes Behavior appropriate: Yes.  ; If no, describe:  Nutrition and fluids offered: yes Toileting and hygiene offered: Yes  Sitter present: q15 minute observations and security monitoring Law enforcement present: Yes    
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
## 2018-06-16 ENCOUNTER — Other Ambulatory Visit: Payer: Self-pay

## 2018-06-16 DIAGNOSIS — F4325 Adjustment disorder with mixed disturbance of emotions and conduct: Principal | ICD-10-CM

## 2018-06-16 LAB — GLUCOSE, CAPILLARY: Glucose-Capillary: 207 mg/dL — ABNORMAL HIGH (ref 70–99)

## 2018-06-16 MED ORDER — INSULIN DETEMIR 100 UNIT/ML ~~LOC~~ SOLN
15.0000 [IU] | Freq: Two times a day (BID) | SUBCUTANEOUS | Status: DC
  Administered 2018-06-16 – 2018-06-20 (×8): 15 [IU] via SUBCUTANEOUS
  Filled 2018-06-16 (×12): qty 0.15

## 2018-06-16 MED ORDER — PALIPERIDONE ER 3 MG PO TB24
9.0000 mg | ORAL_TABLET | Freq: Every day | ORAL | Status: DC
  Administered 2018-06-16: 22:00:00 9 mg via ORAL
  Filled 2018-06-16: qty 3

## 2018-06-16 NOTE — Progress Notes (Signed)
CSW spoke with [name] [phone], group home owner who reported that pt was doing really bad this time and would not calm down even with the police. [name] reported that pt can return to the group home and she will pick her up at discharge [but they do not do pick ups on the weekends]. [name] reported that pt was not well when she left the hospital last time and she wants pt to be stable before discharging.   [name] requested a letter to give to pts lawyer stating pt is not well enough to attend court on 06/28/18 so they can push it back. CSW notified Dr. Weber Cooks who reported that he would provide a letter.  Evalina Field, MSW, LCSW Clinical Social Work 06/16/2018 2:20 PM

## 2018-06-16 NOTE — BHH Group Notes (Signed)
Balance In Life 06/16/2018 1PM  Type of Therapy/Topic:  Group Therapy:  Balance in Life  Participation Level:  Active  Description of Group:   This group will address the concept of balance and how it feels and looks when one is unbalanced. Patients will be encouraged to process areas in their lives that are out of balance and identify reasons for remaining unbalanced. Facilitators will guide patients in utilizing problem-solving interventions to address and correct the stressor making their life unbalanced. Understanding and applying boundaries will be explored and addressed for obtaining and maintaining a balanced life. Patients will be encouraged to explore ways to assertively make their unbalanced needs known to significant others in their lives, using other group members and facilitator for support and feedback.  Therapeutic Goals: 1. Patient will identify two or more emotions or situations they have that consume much of in their lives. 2. Patient will identify signs/triggers that life has become out of balance:  3. Patient will identify two ways to set boundaries in order to achieve balance in their lives:  4. Patient will demonstrate ability to communicate their needs through discussion and/or role plays  Summary of Patient Progress: Pt required some redirection during today's session. Pt was open to feedback from group on ways to manage her anger so she can find balance in life. Pt able to identify negative consequences she has received as result of her not exercising self control.   Therapeutic Modalities:   Cognitive Behavioral Therapy Solution-Focused Therapy Assertiveness Training  Lathan Gieselman Lynelle Smoke, LCSW
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
## 2018-06-16 NOTE — Plan of Care (Addendum)
Pt stated that "trazodone helps" referring to sleep. Pt rates depression and anxiety both 10/10. Pt denies SI and HI. Pt says she has AVH and negative in nature. Pt has a goal to take a shower and attend groups. Pt was educated on care plan and verbalizes understanding. Collier Bullock RN Problem: Education: Goal: Knowledge of [facility] Education information/materials will improve Outcome: Progressing Goal: Emotional status will improve Outcome: Not Progressing Goal: Mental status will improve Outcome: Not Progressing Goal: Verbalization of understanding the information provided will improve Outcome: Progressing   Problem: Activity: Goal: Interest or engagement in activities will improve Outcome: Progressing Goal: Sleeping patterns will improve Outcome: Progressing   Problem: Coping: Goal: Ability to verbalize frustrations and anger appropriately will improve Outcome: Progressing Goal: Ability to demonstrate self-control will improve Outcome: Progressing   Problem: Health Behavior/Discharge Planning: Goal: Identification of resources available to assist in meeting health care needs will improve Outcome: Progressing Goal: Compliance with treatment plan for underlying cause of condition will improve Outcome: Progressing   Problem: Physical Regulation: Goal: Ability to maintain clinical measurements within normal limits will improve Outcome: Progressing   Problem: Safety: Goal: Periods of time without injury will increase Outcome: Progressing
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
## 2018-06-16 NOTE — BHH Suicide Risk Assessment (Signed)
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  Feels like she cannot stand living with them anymore.  She says that she is having auditory hallucinations although she cannot really describe them and never seems distracted by them.  Has no other specific physical complaints.  Tells me that she thinks the "new medicine" has been good.  Continued Clinical Symptoms:  Alcohol Use Disorder Identification Test Final Score (AUDIT): 1 The "Alcohol Use Disorders Identification Test", Guidelines for Use in Primary Care, Second Edition.  World Pharmacologist [facility]). Score between 0-7:  no or low risk or alcohol related problems. Score between 8-15:  moderate risk of alcohol related problems. Score between 16-19:  high risk of alcohol related problems. Score 20 or above:  warrants further diagnostic evaluation for alcohol dependence and treatment.   CLINICAL FACTORS:   Schizophrenia:   Depressive state Paranoid or undifferentiated  type   Musculoskeletal: Strength & Muscle Tone: within normal limits Gait & Station: normal Patient leans: N/A  Psychiatric Specialty Exam: Physical Exam  Nursing note and vitals reviewed. Constitutional: She appears well-developed and well-nourished.  HENT:  Head: Normocephalic and atraumatic.  Eyes: Pupils are equal, round, and reactive to light. Conjunctivae are normal.  Neck: Normal range of motion.  Cardiovascular: Regular rhythm and normal heart sounds.  Respiratory: Effort normal.  GI: Soft.  Musculoskeletal: Normal range of motion.  Neurological: She is alert.  Skin: Skin is warm and dry.  Psychiatric: Her affect is blunt. Her speech is delayed. She is slowed. Thought content is paranoid. Cognition and memory are impaired. She expresses impulsivity and inappropriate judgment. She expresses no homicidal and no suicidal ideation.    Review of Systems  Constitutional: Negative.   HENT: Negative.   Eyes: Negative.   Respiratory: Negative.   Cardiovascular: Negative.   Gastrointestinal: Negative.   Musculoskeletal: Negative.   Skin: Negative.   Neurological: Negative.   Psychiatric/Behavioral: Positive for hallucinations. Negative for depression, memory loss, substance abuse and suicidal ideas. The patient is nervous/anxious. The patient does not have insomnia.     Blood pressure 120/66, pulse 81, temperature 98.5 F (36.9 C), temperature source Oral, resp. rate 18, height 4\' 10"  (1.473 m), weight 71.7 kg, SpO2 98 %.Body mass index is 33.02 kg/m.  General Appearance: Casual  Eye Contact:  Fair  Speech:  Slow  Volume:  Decreased  Mood:  Depressed  Affect:  Constricted  Thought Process:  Disorganized  Orientation:  Full (Time, Place, and Person)  Thought Content:  Logical  Suicidal Thoughts:  No  Homicidal Thoughts:  No  Memory:  Immediate;   Fair Recent;  Fair Remote;   Fair  Judgement:  Impaired  Insight:  Shallow  Psychomotor Activity:  Decreased   Concentration:  Concentration: Fair  Recall:  [employer] of Knowledge:  Fair  Language:  Fair  Akathisia:  No  Handed:  Right  AIMS (if indicated):     Assets:  Desire for Improvement Housing  ADL's:  Intact  Cognition:  Impaired,  Mild  Sleep:  Number of Hours: 6.25      COGNITIVE FEATURES THAT CONTRIBUTE TO RISK:  Closed-mindedness    SUICIDE RISK:   Minimal: No identifiable suicidal ideation.  Patients presenting with no risk factors but with morbid ruminations; may be classified as minimal risk based on the severity of the depressive symptoms  PLAN OF CARE: 15-minute checks.  Engage in individual and group therapy.  Continue medication management.  Work with patient on controlling anger symptoms.  Try to make sure we have a safe discharge plan.  I certify that inpatient services furnished can reasonably be expected to improve the patient's condition.   Alethia Berthold, MD 06/16/2018, 3:21 PM

## 2018-06-16 NOTE — BHH Suicide Risk Assessment (Signed)
[place] INPATIENT:  Family/Significant Other Suicide Prevention Education  Suicide Prevention Education:  Contact Attempts: Ollen Bowl, sister [phone] has been identified by the patient as the family member/significant other with whom the patient will be residing, and identified as the person(s) who will aid the patient in the event of a mental health crisis.  With written consent from the patient, two attempts were made to provide suicide prevention education, prior to and/or following the patient's discharge.  We were unsuccessful in providing suicide prevention education.  A suicide education pamphlet was given to the patient to share with family/significant other.  Date and time of first attempt: 06/16/2018 at 1:07PM Date and time of second attempt:   Delfin Edis MSW LCSW 06/16/2018, 1:08 PM

## 2018-06-16 NOTE — H&P (Signed)
Psychiatric Admission Assessment Adult  Patient Identification: Jessica [name] MRN:  [id number] Date of Evaluation:  06/16/2018 Chief Complaint:  Bipolar Principal Diagnosis: Adjustment disorder with mixed disturbance of emotions and conduct Diagnosis:  Principal Problem:   Adjustment disorder with mixed disturbance of emotions and conduct Active Problems:   Schizophrenia ([place])   Diabetes mellitus without complication (HCC)   Mild intellectual disability   Borderline personality disorder ([place])   HTN (hypertension)   Homicidal thoughts  History of Present Illness: Patient seen and chart reviewed.  Patient well-known from previous encounters.  She came from her group home stating that she was so angry at her roommate and that her group home manager that she could not stand being around them.  To me today she denies any suicidal or homicidal thought.  No evidence that she actually did anything violent this time.  Patient says that her roommate just gets on her nerves too much and she cannot stand being around her anymore.  She denies being depressed.  She denies suicidal or homicidal ideation.  She claims to have auditory hallucinations which are poorly described and do not seem to be particularly distracting.  She says she has been compliant with her medicine at home.  Group home past on that they thought she did better when she took [place] twice a day.  No substance abuse Associated Signs/Symptoms: Depression Symptoms:  psychomotor agitation, difficulty concentrating, (Hypo) Manic Symptoms:  Distractibility, Irritable Mood, Anxiety Symptoms:  Excessive Worry, Psychotic Symptoms:  Hallucinations: Auditory Paranoia, PTSD Symptoms: Negative Total Time spent with patient: 1 hour  Past Psychiatric History: Patient has a history of developmental disability and history of schizophrenia and chronic mental health problems with lots of behavior issues.  This is 2 hospitalizations back to back for  her about disliking her group home which has been a common theme in the past.  Does have a history of getting aggressive towards others when she is upset.  Medications seem to have been of partial benefit  Is the patient at risk to self? No.  Has the patient been a risk to self in the past 6 months? Yes.    Has the patient been a risk to self within the distant past? Yes.    Is the patient a risk to others? Yes.    Has the patient been a risk to others in the past 6 months? Yes.    Has the patient been a risk to others within the distant past? Yes.     Prior Inpatient Therapy:   Prior Outpatient Therapy:    Alcohol Screening: 1. How often do you have a drink containing alcohol?: Monthly or less 2. How many drinks containing alcohol do you have on a typical day when you are drinking?: 1 or 2 3. How often do you have six or more drinks on one occasion?: Never AUDIT-C Score: 1 4. How often during the last year have you found that you were not able to stop drinking once you had started?: Never 5. How often during the last year have you failed to do what was normally expected from you becasue of drinking?: Never 6. How often during the last year have you needed a first drink in the morning to get yourself going after a heavy drinking session?: Never 7. How often during the last year have you had a feeling of guilt of remorse after drinking?: Never 8. How often during the last year have you been unable to remember what happened  the night before because you had been drinking?: Never 9. Have you or someone else been injured as a result of your drinking?: No 10. Has a relative or friend or a doctor or another health worker been concerned about your drinking or suggested you cut down?: No Alcohol Use Disorder Identification Test Final Score (AUDIT): 1 Alcohol Brief Interventions/Follow-up: AUDIT Score <7 follow-up not indicated Substance Abuse History in the last 12 months:  No. Consequences of  Substance Abuse: Negative Previous Psychotropic Medications: Yes  Psychological Evaluations: Yes  Past Medical History:  Past Medical History:  Diagnosis Date  . Anxiety   . Depression   . Diabetes mellitus without complication ([place])   . GERD (gastroesophageal reflux disease)   . Hypertension    History reviewed. No pertinent surgical history. Family History: History reviewed. No pertinent family history. Family Psychiatric  History: None reported Tobacco Screening: Have you used any form of tobacco in the last 30 days? (Cigarettes, Smokeless Tobacco, Cigars, and/or Pipes): No Social History:  Social History   Substance and Sexual Activity  Alcohol Use No  . Frequency: Never     Social History   Substance and Sexual Activity  Drug Use Yes  . Types: Marijuana, "Crack" cocaine   Comment: reports she has not done anything in a long time    Additional Social History:                           Allergies:   Allergies  Allergen Reactions  . Abilify [Aripiprazole] Other (See Comments)    Chokes on food  . Haldol [Haloperidol] Other (See Comments)    Dizziness  . Risperdal [Risperidone] Other (See Comments)    Leg weakness   Lab Results:  Results for orders placed or performed during the hospital encounter of 06/15/18 (from the past 48 hour(s))  Glucose, capillary     Status: Abnormal   Collection Time: 06/15/18 10:49 PM  Result Value Ref Range   Glucose-Capillary 207 (H) 70 - 99 mg/dL  Glucose, capillary     Status: Abnormal   Collection Time: 06/16/18  7:06 AM  Result Value Ref Range   Glucose-Capillary 207 (H) 70 - 99 mg/dL    Blood Alcohol level:  Lab Results  Component Value Date   ETH <10 06/14/2018   ETH <10 30/16/0109    Metabolic Disorder Labs:  Lab Results  Component Value Date   HGBA1C 5.3 06/05/2018   MPG 105.41 06/05/2018   MPG 102.54 12/30/2017   No results found for: PROLACTIN Lab Results  Component Value Date   CHOL 155  06/05/2018   TRIG 93 06/05/2018   HDL 54 06/05/2018   CHOLHDL 2.9 06/05/2018   VLDL 19 06/05/2018   LDLCALC 82 06/05/2018   LDLCALC 90 12/30/2017    Current Medications: Current Facility-Administered Medications  Medication Dose Route Frequency Provider Last Rate Last Dose  . acetaminophen (TYLENOL) tablet 650 mg  650 mg Oral Q6H PRN Lavella Hammock, MD      . alum & mag hydroxide-simeth (MAALOX/MYLANTA) 200-200-20 MG/5ML suspension 30 mL  30 mL Oral Q4H PRN Lavella Hammock, MD      . atorvastatin (LIPITOR) tablet 10 mg  10 mg Oral Daily Lavella Hammock, MD   10 mg at 06/16/18 0817  . carvedilol (COREG) tablet 6.25 mg  6.25 mg Oral BID Lavella Hammock, MD   6.25 mg at 06/16/18 0820  . DULoxetine (CYMBALTA) DR capsule  60 mg  60 mg Oral Daily Lavella Hammock, MD   60 mg at 06/16/18 4081  . ferrous sulfate tablet 325 mg  325 mg Oral BID Lavella Hammock, MD   325 mg at 06/16/18 0818  . gabapentin (NEURONTIN) tablet 600 mg  600 mg Oral TID Lavella Hammock, MD   600 mg at 06/16/18 1144  . insulin detemir (LEVEMIR) injection 15 Units  15 Units Subcutaneous BID Shakenna Herrero T, MD      . lamoTRIgine (LAMICTAL) tablet 100 mg  100 mg Oral QHS Lavella Hammock, MD      . levothyroxine (SYNTHROID) tablet 150 mcg  150 mcg Oral Q0600 Lavella Hammock, MD   150 mcg at 06/16/18 0640  . lisinopril (ZESTRIL) tablet 10 mg  10 mg Oral Daily Lavella Hammock, MD   10 mg at 06/16/18 0815  . loratadine (CLARITIN) tablet 10 mg  10 mg Oral Daily Lavella Hammock, MD   10 mg at 06/16/18 0818  . magnesium hydroxide (MILK OF MAGNESIA) suspension 30 mL  30 mL Oral Daily PRN Lavella Hammock, MD      . metFORMIN (GLUCOPHAGE) tablet 500 mg  500 mg Oral BID Lavella Hammock, MD   500 mg at 06/16/18 0818  . [START ON 06/20/2018] paliperidone (INVEGA SUSTENNA) injection 156 mg  156 mg Intramuscular Q28 days Lavella Hammock, MD      . paliperidone (INVEGA) 24 hr tablet 9 mg  9 mg Oral QHS Jong Rickman T, MD      .  pantoprazole (PROTONIX) EC tablet 40 mg  40 mg Oral Daily Lavella Hammock, MD   40 mg at 06/16/18 4481  . traZODone (DESYREL) tablet 100 mg  100 mg Oral QHS Lavella Hammock, MD       PTA Medications: Medications Prior to Admission  Medication Sig Dispense Refill Last Dose  . atorvastatin (LIPITOR) 10 MG tablet Take 1 tablet (10 mg total) by mouth daily. 30 tablet 0 unknown at unknown  . carvedilol (COREG) 6.25 MG tablet Take 1 tablet (6.25 mg total) by mouth 2 (two) times daily. 60 tablet 0 unknown at unknown  . DULoxetine (CYMBALTA) 60 MG capsule Take 1 capsule (60 mg total) by mouth daily. 30 capsule 0 unknown at unknown  . ferrous sulfate (FEROSUL) 325 (65 FE) MG tablet Take 1 tablet (325 mg total) by mouth 2 (two) times daily. 60 tablet 0 unknown at unknown  . gabapentin (NEURONTIN) 600 MG tablet Take 1 tablet (600 mg total) by mouth 3 (three) times daily. 90 tablet 0 unknown at unknown  . insulin detemir (LEVEMIR) 100 UNIT/ML injection Inject 0.15 mLs (15 Units total) into the skin 2 (two) times daily. 10 mL 1 unknown at unknown  . lamoTRIgine (LAMICTAL) 100 MG tablet Take 1 tablet (100 mg total) by mouth at bedtime. 30 tablet 0 unknown at unknown  . levothyroxine (SYNTHROID) 150 MCG tablet Take 1 tablet (150 mcg total) by mouth daily at 6 (six) AM. 30 tablet 0 unknown at unknown  . lisinopril (ZESTRIL) 10 MG tablet Take 1 tablet (10 mg total) by mouth daily. 30 tablet 0 unknown at unknown  . loratadine (CLARITIN) 10 MG tablet Take 1 tablet (10 mg total) by mouth daily. 30 tablet 0 unknown at unknown  . Lurasidone HCl 120 MG TABS Take 1 tablet (120 mg total) by mouth daily with breakfast. 30 tablet 0 unknown at unknown  . metFORMIN (GLUCOPHAGE) 500 MG  tablet Take 1 tablet (500 mg total) by mouth 2 (two) times daily. 60 tablet 0 unknown at unknown  . pantoprazole (PROTONIX) 40 MG tablet Take 1 tablet (40 mg total) by mouth daily. 30 tablet 0 unknown at unknown  . traZODone (DESYREL) 100 MG  tablet Take 1 tablet (100 mg total) by mouth at bedtime. 30 tablet 0 unknonw at unknown    Musculoskeletal: Strength & Muscle Tone: within normal limits Gait & Station: normal Patient leans: N/A  Psychiatric Specialty Exam: Physical Exam  Nursing note and vitals reviewed. Constitutional: She appears well-developed and well-nourished.  HENT:  Head: Normocephalic and atraumatic.  Eyes: Pupils are equal, round, and reactive to light. Conjunctivae are normal.  Neck: Normal range of motion.  Cardiovascular: Regular rhythm and normal heart sounds.  Respiratory: No respiratory distress.  GI: Soft.  Musculoskeletal: Normal range of motion.  Neurological: She is alert.  Skin: Skin is warm and dry.  Psychiatric: Her affect is blunt. Her speech is tangential. She is agitated. She is not aggressive. Thought content is paranoid. Thought content is not delusional. Cognition and memory are impaired. She expresses impulsivity and inappropriate judgment. She expresses no homicidal and no suicidal ideation.    Review of Systems  Constitutional: Negative.   HENT: Negative.   Eyes: Negative.   Respiratory: Negative.   Cardiovascular: Negative.   Gastrointestinal: Negative.   Musculoskeletal: Negative.   Skin: Negative.   Neurological: Negative.   Psychiatric/Behavioral: Positive for hallucinations. Negative for depression, substance abuse and suicidal ideas. The patient is nervous/anxious and has insomnia.     Blood pressure 120/66, pulse 81, temperature 98.5 F (36.9 C), temperature source Oral, resp. rate 18, height 4\' 10"  (1.473 m), weight 71.7 kg, SpO2 98 %.Body mass index is 33.02 kg/m.  General Appearance: Casual  Eye Contact:  Good  Speech:  Clear and Coherent  Volume:  Decreased  Mood:  Irritable  Affect:  Congruent  Thought Process:  Goal Directed  Orientation:  Full (Time, Place, and Person)  Thought Content:  Paranoid Ideation, Rumination and Tangential  Suicidal Thoughts:  No   Homicidal Thoughts:  No  Memory:  Immediate;   Fair Recent;   Fair Remote;   Fair  Judgement:  Impaired  Insight:  Shallow  Psychomotor Activity:  Normal  Concentration:  Concentration: Fair  Recall:  [employer] of Knowledge:  Fair  Language:  Fair  Akathisia:  No  Handed:  Right  AIMS (if indicated):     Assets:  Desire for Improvement Financial Resources/Insurance Housing Resilience  ADL's:  Intact  Cognition:  Impaired,  Mild  Sleep:  Number of Hours: 6.25    Treatment Plan Summary: Daily contact with patient to assess and evaluate symptoms and progress in treatment, Medication management and Plan Patient's antipsychotic was changed in the emergency room.  She was taken off of her [place] and was put on in [place].  Plans were made for her to start long-acting injectable.  Given that that is already been started and the patient thinks it is working we will go with that plan for now.  Continue other psychiatric medicine.  Patient really does not need to be here very long I am hopeful that the group home will take her back within a day or 2.  Meanwhile engage in appropriate groups and activities and do daily psychoeducation and supportive counseling in addition the patient's diabetes is slightly out of control.  She was not continued on her standing insulin on admission  which I will also do.  Observation Level/Precautions:  15 minute checks  Laboratory:  HbAIC  Psychotherapy:    Medications:    Consultations:    Discharge Concerns:    Estimated LOS:  Other:     Physician Treatment Plan for Primary Diagnosis: Adjustment disorder with mixed disturbance of emotions and conduct Long Term Goal(s): Improvement in symptoms so as ready for discharge  Short Term Goals: Ability to verbalize feelings will improve and Ability to demonstrate self-control will improve  Physician Treatment Plan for Secondary Diagnosis: Principal Problem:   Adjustment disorder with mixed disturbance of  emotions and conduct Active Problems:   Schizophrenia ([place])   Diabetes mellitus without complication (HCC)   Mild intellectual disability   Borderline personality disorder (HCC)   HTN (hypertension)   Homicidal thoughts  Long Term Goal(s): Improvement in symptoms so as ready for discharge  Short Term Goals: Ability to maintain clinical measurements within normal limits will improve  I certify that inpatient services furnished can reasonably be expected to improve the patient's condition.    Alethia Berthold, MD 5/28/20203:24 PM

## 2018-06-16 NOTE — B[facility] Group Notes (Signed)
[place] Group Notes:  (Nursing/MHT/Case Management/Adjunct)  Date:  06/16/2018  Time:  8:55 PM  Type of Therapy:  Group Therapy  Participation Level:  Active  Participation Quality:  Sharing  Affect:  Appropriate  Cognitive:  Alert  Insight:  Appropriate  Engagement in Group:  Engaged  Modes of Intervention:  Support  Summary of Progress/Problems:  Jessica [name] 06/16/2018, 8:55 PM

## 2018-06-16 NOTE — Tx Team (Signed)
Initial Treatment Plan 06/16/2018 4:56 AM Shaaron Adler FAO:[id number]    PATIENT STRESSORS: Health problems Medication change or noncompliance Traumatic event   PATIENT STRENGTHS: Active sense of humor Motivation for treatment/growth Special hobby/interest   PATIENT IDENTIFIED PROBLEMS: schizophrenia     Depression                  DISCHARGE CRITERIA:  Improved stabilization in mood, thinking, and/or behavior Motivation to continue treatment in a less acute level of care  PRELIMINARY DISCHARGE PLAN: Outpatient therapy Placement in alternative living arrangements  PATIENT/FAMILY INVOLVEMENT: This treatment plan has been presented to and reviewed with the patient, PRITI CONSOLI, The patient and family have been given the opportunity to ask questions and make suggestions.  Harl Bowie, RN 06/16/2018, 4:56 AM

## 2018-06-16 NOTE — Progress Notes (Signed)
Inpatient Diabetes Program Recommendations  AACE/ADA: New Consensus Statement on Inpatient Glycemic Control (2015)  Target Ranges:  Prepandial:   less than 140 mg/dL      Peak postprandial:   less than 180 mg/dL (1-2 hours)      Critically ill patients:  140 - 180 mg/dL   Results for Jessica [name], Jessica [name] (MRN [id number]) as of 06/16/2018 09:36  Ref. Range 06/15/2018 17:48 06/15/2018 22:49 06/16/2018 07:06  Glucose-Capillary Latest Ref Range: 70 - 99 mg/dL 111 (H) 207 (H) 207 (H)   Results for Jessica [name], Jessica [name] (MRN [id number]) as of 06/16/2018 09:36  Ref. Range 06/05/2018 06:34  Hemoglobin A1C Latest Ref Range: 4.8 - 5.6 % 5.3    To ER via [facility] under commitment due to making threats of violence towards roommate at group home  History: DM, Depression, Borderline personality disorder, Schizophrenia   Home DM Meds: Metformin 500 mg BID       Levemir 15 units BID  Current Orders: Metformin 500 mg BID     MD- Please consider the following in-hospital insulin adjustments:   1. Start Novolog Sensitive Correction Scale/ SSI (0-9 units) TID AC + HS   2. Start Levemir 7 units BID (50% total home dose)      --Will follow patient during hospitalization--  Wyn Quaker RN, MSN, CDE Diabetes Coordinator Inpatient Glycemic Control Team Team Pager: [phone] (8a-5p)

## 2018-06-16 NOTE — Progress Notes (Signed)
Recreation Therapy Notes  Date: 06/16/2018  Time: 9:30 am  Location: Craft room  Behavioral response: Appropriate   Intervention Topic: Time Management   Discussion/Intervention:  Group content today was focused on time management. The group defined time management and identified healthy ways to manage time. Individuals expressed how much of the 24 hours they use in a day. Patients expressed how much time they use just for themselves personally. The group expressed how they have managed their time in the past. Individuals participated in the intervention "Managing Life" where they had a chance to see how much of the 24 hours they use and where it goes.  Clinical Observations/Feedback:  Patient came to group late and stated she prays and meditates to manage her time. Individual was social with staff while participating in the intervention. Precilla Purnell LRT/CTRS           Fin[place] Mills 06/16/2018 12:23 PM

## 2018-06-17 LAB — GLUCOSE, CAPILLARY: Glucose-Capillary: 126 mg/dL — ABNORMAL HIGH (ref 70–99)

## 2018-06-17 MED ORDER — LORAZEPAM 2 MG/ML IJ SOLN
2.0000 mg | Freq: Once | INTRAMUSCULAR | Status: AC
  Administered 2018-06-18: 2 mg via INTRAMUSCULAR
  Filled 2018-06-17: qty 1

## 2018-06-17 MED ORDER — LURASIDONE HCL 40 MG PO TABS
40.0000 mg | ORAL_TABLET | Freq: Two times a day (BID) | ORAL | Status: DC
  Administered 2018-06-17 – 2018-06-20 (×6): 40 mg via ORAL
  Filled 2018-06-17 (×8): qty 1

## 2018-06-17 MED ORDER — LORAZEPAM 2 MG PO TABS: 2.0000 mg | ORAL_TABLET | Freq: Once | ORAL | Status: AC

## 2018-06-17 NOTE — Tx Team (Addendum)
Interdisciplinary Treatment and Diagnostic Plan Update  06/17/2018 Time of Session: Jessica [name] MRN: [id number]  Principal Diagnosis: Adjustment disorder with mixed disturbance of emotions and conduct  Secondary Diagnoses: Principal Problem:   Adjustment disorder with mixed disturbance of emotions and conduct Active Problems:   Schizophrenia ([place])   Diabetes mellitus without complication (HCC)   Mild intellectual disability   Borderline personality disorder (HCC)   HTN (hypertension)   Homicidal thoughts   Current Medications:  Current Facility-Administered Medications  Medication Dose Route Frequency Provider Last Rate Last Dose  . acetaminophen (TYLENOL) tablet 650 mg  650 mg Oral Q6H PRN Lavella Hammock, MD      . alum & mag hydroxide-simeth (MAALOX/MYLANTA) 200-200-20 MG/5ML suspension 30 mL  30 mL Oral Q4H PRN Lavella Hammock, MD      . atorvastatin (LIPITOR) tablet 10 mg  10 mg Oral Daily Lavella Hammock, MD   10 mg at 06/17/18 5784  . carvedilol (COREG) tablet 6.25 mg  6.25 mg Oral BID Lavella Hammock, MD   6.25 mg at 06/17/18 0810  . DULoxetine (CYMBALTA) DR capsule 60 mg  60 mg Oral Daily Lavella Hammock, MD   60 mg at 06/17/18 0810  . ferrous sulfate tablet 325 mg  325 mg Oral BID Lavella Hammock, MD   325 mg at 06/17/18 0810  . gabapentin (NEURONTIN) tablet 600 mg  600 mg Oral TID Lavella Hammock, MD   600 mg at 06/17/18 1255  . insulin detemir (LEVEMIR) injection 15 Units  15 Units Subcutaneous BID Clapacs, Madie Reno, MD   15 Units at 06/17/18 [phone]  . lamoTRIgine (LAMICTAL) tablet 100 mg  100 mg Oral QHS Lavella Hammock, MD   100 mg at 06/16/18 2144  . levothyroxine (SYNTHROID) tablet 150 mcg  150 mcg Oral Q0600 Lavella Hammock, MD   150 mcg at 06/17/18 [phone]  . lisinopril (ZESTRIL) tablet 10 mg  10 mg Oral Daily Lavella Hammock, MD   10 mg at 06/17/18 0810  . loratadine (CLARITIN) tablet 10 mg  10 mg Oral Daily Lavella Hammock, MD   10 mg at 06/17/18 0810   . lurasidone (LATUDA) tablet 40 mg  40 mg Oral BID WC Clapacs, John T, MD      . magnesium hydroxide (MILK OF MAGNESIA) suspension 30 mL  30 mL Oral Daily PRN Lavella Hammock, MD      . metFORMIN (GLUCOPHAGE) tablet 500 mg  500 mg Oral BID Lavella Hammock, MD   500 mg at 06/17/18 0810  . pantoprazole (PROTONIX) EC tablet 40 mg  40 mg Oral Daily Lavella Hammock, MD   40 mg at 06/17/18 0815  . traZODone (DESYREL) tablet 100 mg  100 mg Oral QHS Lavella Hammock, MD   100 mg at 06/16/18 2144   PTA Medications: Medications Prior to Admission  Medication Sig Dispense Refill Last Dose  . atorvastatin (LIPITOR) 10 MG tablet Take 1 tablet (10 mg total) by mouth daily. 30 tablet 0 unknown at unknown  . carvedilol (COREG) 6.25 MG tablet Take 1 tablet (6.25 mg total) by mouth 2 (two) times daily. 60 tablet 0 unknown at unknown  . DULoxetine (CYMBALTA) 60 MG capsule Take 1 capsule (60 mg total) by mouth daily. 30 capsule 0 unknown at unknown  . ferrous sulfate (FEROSUL) 325 (65 FE) MG tablet Take 1 tablet (325 mg total) by mouth 2 (two) times daily. 60 tablet 0 unknown  at unknown  . gabapentin (NEURONTIN) 600 MG tablet Take 1 tablet (600 mg total) by mouth 3 (three) times daily. 90 tablet 0 unknown at unknown  . insulin detemir (LEVEMIR) 100 UNIT/ML injection Inject 0.15 mLs (15 Units total) into the skin 2 (two) times daily. 10 mL 1 unknown at unknown  . lamoTRIgine (LAMICTAL) 100 MG tablet Take 1 tablet (100 mg total) by mouth at bedtime. 30 tablet 0 unknown at unknown  . levothyroxine (SYNTHROID) 150 MCG tablet Take 1 tablet (150 mcg total) by mouth daily at 6 (six) AM. 30 tablet 0 unknown at unknown  . lisinopril (ZESTRIL) 10 MG tablet Take 1 tablet (10 mg total) by mouth daily. 30 tablet 0 unknown at unknown  . loratadine (CLARITIN) 10 MG tablet Take 1 tablet (10 mg total) by mouth daily. 30 tablet 0 unknown at unknown  . Lurasidone HCl 120 MG TABS Take 1 tablet (120 mg total) by mouth daily with  breakfast. 30 tablet 0 unknown at unknown  . metFORMIN (GLUCOPHAGE) 500 MG tablet Take 1 tablet (500 mg total) by mouth 2 (two) times daily. 60 tablet 0 unknown at unknown  . pantoprazole (PROTONIX) 40 MG tablet Take 1 tablet (40 mg total) by mouth daily. 30 tablet 0 unknown at unknown  . traZODone (DESYREL) 100 MG tablet Take 1 tablet (100 mg total) by mouth at bedtime. 30 tablet 0 unknonw at unknown    Patient Stressors: Health problems Medication change or noncompliance Traumatic event  Patient Strengths: Active sense of humor Motivation for treatment/growth Special hobby/interest  Treatment Modalities: Medication Management, Group therapy, Case management,  1 to 1 session with clinician, Psychoeducation, Recreational therapy.   Physician Treatment Plan for Primary Diagnosis: Adjustment disorder with mixed disturbance of emotions and conduct Long Term Goal(s): Improvement in symptoms so as ready for discharge Improvement in symptoms so as ready for discharge   Short Term Goals: Ability to verbalize feelings will improve Ability to demonstrate self-control will improve Ability to maintain clinical measurements within normal limits will improve  Medication Management: Evaluate patient's response, side effects, and tolerance of medication regimen.  Therapeutic Interventions: 1 to 1 sessions, Unit Group sessions and Medication administration.  Evaluation of Outcomes: Progressing  Physician Treatment Plan for Secondary Diagnosis: Principal Problem:   Adjustment disorder with mixed disturbance of emotions and conduct Active Problems:   Schizophrenia ([place])   Diabetes mellitus without complication (HCC)   Mild intellectual disability   Borderline personality disorder (HCC)   HTN (hypertension)   Homicidal thoughts  Long Term Goal(s): Improvement in symptoms so as ready for discharge Improvement in symptoms so as ready for discharge   Short Term Goals: Ability to verbalize  feelings will improve Ability to demonstrate self-control will improve Ability to maintain clinical measurements within normal limits will improve     Medication Management: Evaluate patient's response, side effects, and tolerance of medication regimen.  Therapeutic Interventions: 1 to 1 sessions, Unit Group sessions and Medication administration.  Evaluation of Outcomes: Progressing   RN Treatment Plan for Primary Diagnosis: Adjustment disorder with mixed disturbance of emotions and conduct Long Term Goal(s): Knowledge of disease and therapeutic regimen to maintain health will improve  Short Term Goals: Ability to verbalize frustration and anger appropriately will improve, Ability to demonstrate self-control, Ability to participate in decision making will improve and Ability to verbalize feelings will improve  Medication Management: RN will administer medications as ordered by provider, will assess and evaluate patient's response and provide education to patient for  prescribed medication. RN will report any adverse and/or side effects to prescribing provider.  Therapeutic Interventions: 1 on 1 counseling sessions, Psychoeducation, Medication administration, Evaluate responses to treatment, Monitor vital signs and CBGs as ordered, Perform/monitor CIWA, COWS, AIMS and Fall Risk screenings as ordered, Perform wound care treatments as ordered.  Evaluation of Outcomes: Progressing   LCSW Treatment Plan for Primary Diagnosis: Adjustment disorder with mixed disturbance of emotions and conduct Long Term Goal(s): Safe transition to appropriate next level of care at discharge, Engage patient in therapeutic group addressing interpersonal concerns.  Short Term Goals: Engage patient in aftercare planning with referrals and resources, Increase ability to appropriately verbalize feelings, Increase emotional regulation, Facilitate acceptance of mental health diagnosis and concerns and Increase skills for  wellness and recovery  Therapeutic Interventions: Assess for all discharge needs, 1 to 1 time with Social worker, Explore available resources and support systems, Assess for adequacy in community support network, Educate family and significant other(s) on suicide prevention, Complete Psychosocial Assessment, Interpersonal group therapy.  Evaluation of Outcomes: Progressing   Progress in Treatment: Attending groups: Yes. Participating in groups: Yes. Taking medication as prescribed: Yes. Toleration medication: Yes. Family/Significant other contact made: Yes, individual(s) contacted:  Pts sister and group home owner Patient understands diagnosis: Yes. Discussing patient identified problems/goals with staff: Yes. Medical problems stabilized or resolved: Yes. Denies suicidal/homicidal ideation: Yes. Issues/concerns per patient self-inventory: No. Other: N/A  New problem(s) identified: No, Describe:  none  New Short Term/Long Term Goal(s): medication management for mood stabilization;  development of comprehensive mental wellness/sobriety plan.   Patient Goals:  "Try to not let myself get upset when someone pushes my buttons"  Discharge Plan or Barriers: SPE pamphlet, Mobile Crisis information, and AA/NA information provided to patient for additional community support and resources. Pt [facility] an [facility] at [facility] on 06/24/2018 at [place].  Reason for Continuation of Hospitalization: Medication stabilization  Estimated Length of Stay: 5-7 days  Recreational Therapy: Patient Stressors: N/A  Patient Goal: Patient will engage in groups without prompting or encouragement from LRT x3 group sessions within 5 recreation therapy group sessions  Attendees: Patient: Jessica [name] 06/17/2018 2:54 PM  Physician: Dr Weber Cooks MD 06/17/2018 2:54 PM  Nursing:  06/17/2018 2:54 PM  RN Care Manager: 06/17/2018 2:54 PM  Social Worker: Minette Brine Moton LCSW 06/17/2018 2:54 PM  Recreational Therapist: Roanna Epley  CTRS LRT 06/17/2018 2:54 PM  Other: Sanjuana Kava LCSW 06/17/2018 2:54 PM  Other: Assunta Curtis LCSW 06/17/2018 2:54 PM  Other: 06/17/2018 2:54 PM    Scribe for Treatment Team: [place], LCSW 06/17/2018 2:54 PM

## 2018-06-17 NOTE — Progress Notes (Signed)
Recreation Therapy Notes   Date: 06/17/2018  Time: 9:30 am  Location: Craft room  Behavioral response: Appropriate   Intervention Topic: Leisure   Discussion/Intervention:  Group content today was focused on leisure. The group defined what leisure is and some positive leisure activities they participate in. Individuals identified the difference between good and bad leisure. Participants expressed how they feel after participating in the leisure of their choice. The group discussed how they go about picking a leisure activity and if others are involved in their leisure activities. The patient stated how many leisure activities they too choose from and reasons why it is important to have leisure time. Individuals participated in the intervention "Exploration of Leisure" where they had a chance to identify new leisure activities as well as benefits of leisure.  Clinical Observations/Feedback:  Patient came to group and identified drawing and meditation as leisure activities she enjoys. Individual was social with staff and peers while participating in the intervention. Ramey Schiff LRT/CTRS         Kayia Billinger 06/17/2018 1:09 PM

## 2018-06-17 NOTE — Plan of Care (Signed)
Patient stated this morning "I am going to be myself.I don't want to be with anyone today."This evening patient asked writer "Am I a trouble to you?".No irritable behaviors noted today.Appropriate with staff & peers.Denies SI,HI and AVH.Compliant with medications.Appetite and energy level good.Support and encouragement given.

## 2018-06-17 NOTE — Progress Notes (Signed)
D - Patient was in the day room upon arrival to the unit. Patient was irritable after having a confrontation with two of her peers on the unit. Patient accidentally picked up and hung up a phone call for another patient. The other patient was upset about it and another patient got involved as well. The three of them started raising their voices at each other and that is when this Probation officer and security got involved. This all happened in the day room. The patient was escorted back to her room and given education. Patient took her medications and calmed down after about thirty minutes. Patient apologized and said, "That girl just pushed my buttons, but I am thankful that you guys didn't let me do anything."   A - Patient compliant with medication administration per MD orders. Patient given education. Patient given support and encouragement to be active in her treatment plan. Patient informed to let staff know if there are any issues or problems on the unit.   R - Patient being monitored Q 15 minutes for safety per unit protocol. Patient remains safe on the unit.

## 2018-06-17 NOTE — [facility] Group Notes (Signed)
[place] Group Notes:  (Nursing/MHT/Case Management/Adjunct)  Date:  06/17/2018  Time:  3:26 PM  Type of Therapy:  Psychoeducational Skills  Participation Level:  Active  Participation Quality:  Appropriate, Attentive and Sharing  Affect:  Appropriate  Cognitive:  Alert and Appropriate  Insight:  Appropriate  Engagement in Group:  Engaged  Modes of Intervention:  Discussion, Education and Support  Summary of Progress/Problems:  Adela Lank [facility] 06/17/2018, 3:26 PM

## 2018-06-17 NOTE — Progress Notes (Signed)
Recreation Therapy Notes  INPATIENT RECREATION THERAPY ASSESSMENT  Patient Details Name: Jessica [name] MRN: [id number] DOB: 1965-05-09 Today's Date: 06/17/2018       Information Obtained From: Patient  Able to Participate in Assessment/Interview: Yes  Patient Presentation: Responsive  Reason for Admission (Per Patient): Active Symptoms  Patient Stressors:    Coping Skills:   Music, Psychiatric nurse, Art  Leisure Interests (2+):  Music - Listen, Art - Coloring, Individual - Journaling  Frequency of Recreation/Participation:    Awareness of Community Resources:     [employer]:     Current Use:    If no, Barriers?:    Expressed Interest in [employer] Information:    [place] of Residence:  Insurance underwriter  Patient Main Form of Transportation: Other (Comment)(Group home)  Patient Strengths:  Nice  Patient Identified Areas of Improvement:  My anger  Patient Goal for Hospitalization:  Thinking positive  Current SI (including self-harm):  No  Current HI:  No  Current AVH: No  Staff Intervention Plan: Group Attendance, Collaborate with Interdisciplinary Treatment Team  Consent to Intern Participation: N/A  Maximum Reiland 06/17/2018, 2:19 PM

## 2018-06-17 NOTE — [facility] Group Notes (Signed)
LCSW Group Therapy Note  06/17/2018 12:32 PM  Type of Therapy and Topic:  Group Therapy:  Feelings around Relapse and Recovery  Participation Level:  Did Not Attend   Description of Group:    Patients in this group will discuss emotions they experience before and after a relapse. They will process how experiencing these feelings, or avoidance of experiencing them, relates to having a relapse. Facilitator will guide patients to explore emotions they have related to recovery. Patients will be encouraged to process which emotions are more powerful. They will be guided to discuss the emotional reaction significant others in their lives may have to their relapse or recovery. Patients will be assisted in exploring ways to respond to the emotions of others without this contributing to a relapse.  Therapeutic Goals: 1. Patient will identify two or more emotions that lead to a relapse for them 2. Patient will identify two emotions that result when they relapse 3. Patient will identify two emotions related to recovery 4. Patient will demonstrate ability to communicate their needs through discussion and/or role plays   Summary of Patient Progress: Pt originally was present for group, but left when group started later reporting to CSW there was another pt she did not want to be around.    Therapeutic Modalities:   Cognitive Behavioral Therapy Solution-Focused Therapy Assertiveness Training Relapse Prevention Therapy   Evalina Field, MSW, LCSW Clinical Social Work 06/17/2018 12:32 PM

## 2018-06-17 NOTE — Progress Notes (Signed)
[facility] MD Progress Note  06/17/2018 3:58 PM Jessica [name]  MRN:  [id number] Subjective: Patient seen chart reviewed.  Patient is a woman with a history of cognitive impairment schizophrenia and recurrent episodes of agitation.  During the day she is mostly calm and appropriate in the evening she seems more prone to get into fights.  Last night she got into a fight with another patient and seemed to lose control for a little while.  Today she is apologizing for it and seems much more calm and lucid.  Not reporting any frank psychotic symptoms.  On conversation with me the patient agrees to the proposal from her group home that [place] twice a day was a more effective medication for her. Principal Problem: Adjustment disorder with mixed disturbance of emotions and conduct Diagnosis: Principal Problem:   Adjustment disorder with mixed disturbance of emotions and conduct Active Problems:   Schizophrenia (HCC)   Diabetes mellitus without complication (HCC)   Mild intellectual disability   Borderline personality disorder (HCC)   HTN (hypertension)   Homicidal thoughts  Total Time spent with patient: 30 minutes  Past Psychiatric History: Patient has a long history of schizophrenia.  Has been stable for extended periods but sometimes will get unstabilized and have frequent hospitalizations.  Gets aggressive with others usually when she is upset  Past Medical History:  Past Medical History:  Diagnosis Date  . Anxiety   . Depression   . Diabetes mellitus without complication ([place])   . GERD (gastroesophageal reflux disease)   . Hypertension    History reviewed. No pertinent surgical history. Family History: History reviewed. No pertinent family history. Family Psychiatric  History: See previous Social History:  Social History   Substance and Sexual Activity  Alcohol Use No  . Frequency: Never     Social History   Substance and Sexual Activity  Drug Use Yes  . Types: Marijuana, "Crack"  cocaine   Comment: reports she has not done anything in a long time    Social History   Socioeconomic History  . Marital status: Single    Spouse name: Not on file  . Number of children: Not on file  . Years of education: Not on file  . Highest education level: Not on file  Occupational History  . Not on file  Social Needs  . Financial resource strain: Not on file  . Food insecurity:    Worry: Not on file    Inability: Not on file  . Transportation needs:    Medical: Not on file    Non-medical: Not on file  Tobacco Use  . Smoking status: Former Research scientist (life sciences)  . Smokeless tobacco: Never Used  Substance and Sexual Activity  . Alcohol use: No    Frequency: Never  . Drug use: Yes    Types: Marijuana, "Crack" cocaine    Comment: reports she has not done anything in a long time  . Sexual activity: Not Currently    Birth control/protection: Abstinence  Lifestyle  . Physical activity:    Days per week: Not on file    Minutes per session: Not on file  . Stress: Not on file  Relationships  . Social connections:    Talks on phone: Not on file    Gets together: Not on file    Attends religious service: Not on file    Active member of club or organization: Not on file    Attends meetings of clubs or organizations: Not on file  Relationship status: Not on file  Other Topics Concern  . Not on file  Social History Narrative  . Not on file   Additional Social History:                         Sleep: Fair  Appetite:  Fair  Current Medications: Current Facility-Administered Medications  Medication Dose Route Frequency Provider Last Rate Last Dose  . acetaminophen (TYLENOL) tablet 650 mg  650 mg Oral Q6H PRN Lavella Hammock, MD      . alum & mag hydroxide-simeth (MAALOX/MYLANTA) 200-200-20 MG/5ML suspension 30 mL  30 mL Oral Q4H PRN Lavella Hammock, MD      . atorvastatin (LIPITOR) tablet 10 mg  10 mg Oral Daily Lavella Hammock, MD   10 mg at 06/17/18 5573  .  carvedilol (COREG) tablet 6.25 mg  6.25 mg Oral BID Lavella Hammock, MD   6.25 mg at 06/17/18 0810  . DULoxetine (CYMBALTA) DR capsule 60 mg  60 mg Oral Daily Lavella Hammock, MD   60 mg at 06/17/18 0810  . ferrous sulfate tablet 325 mg  325 mg Oral BID Lavella Hammock, MD   325 mg at 06/17/18 0810  . gabapentin (NEURONTIN) tablet 600 mg  600 mg Oral TID Lavella Hammock, MD   600 mg at 06/17/18 1255  . insulin detemir (LEVEMIR) injection 15 Units  15 Units Subcutaneous BID Clapacs, Madie Reno, MD   15 Units at 06/17/18 [phone]  . lamoTRIgine (LAMICTAL) tablet 100 mg  100 mg Oral QHS Lavella Hammock, MD   100 mg at 06/16/18 2144  . levothyroxine (SYNTHROID) tablet 150 mcg  150 mcg Oral Q0600 Lavella Hammock, MD   150 mcg at 06/17/18 [phone]  . lisinopril (ZESTRIL) tablet 10 mg  10 mg Oral Daily Lavella Hammock, MD   10 mg at 06/17/18 0810  . loratadine (CLARITIN) tablet 10 mg  10 mg Oral Daily Lavella Hammock, MD   10 mg at 06/17/18 0810  . lurasidone (LATUDA) tablet 40 mg  40 mg Oral BID WC Clapacs, John T, MD      . magnesium hydroxide (MILK OF MAGNESIA) suspension 30 mL  30 mL Oral Daily PRN Lavella Hammock, MD      . metFORMIN (GLUCOPHAGE) tablet 500 mg  500 mg Oral BID Lavella Hammock, MD   500 mg at 06/17/18 0810  . pantoprazole (PROTONIX) EC tablet 40 mg  40 mg Oral Daily Lavella Hammock, MD   40 mg at 06/17/18 0815  . traZODone (DESYREL) tablet 100 mg  100 mg Oral QHS Lavella Hammock, MD   100 mg at 06/16/18 2144    Lab Results:  Results for orders placed or performed during the hospital encounter of 06/15/18 (from the past 48 hour(s))  Glucose, capillary     Status: Abnormal   Collection Time: 06/15/18 10:49 PM  Result Value Ref Range   Glucose-Capillary 207 (H) 70 - 99 mg/dL  Glucose, capillary     Status: Abnormal   Collection Time: 06/16/18  7:06 AM  Result Value Ref Range   Glucose-Capillary 207 (H) 70 - 99 mg/dL  Glucose, capillary     Status: Abnormal   Collection Time:  06/17/18  7:01 AM  Result Value Ref Range   Glucose-Capillary 126 (H) 70 - 99 mg/dL   Comment 1 Notify RN     Blood Alcohol level:  Lab  Results  Component Value Date   ETH <10 06/14/2018   ETH <10 02/58/5277    Metabolic Disorder Labs: Lab Results  Component Value Date   HGBA1C 5.3 06/05/2018   MPG 105.41 06/05/2018   MPG 102.54 12/30/2017   No results found for: PROLACTIN Lab Results  Component Value Date   CHOL 155 06/05/2018   TRIG 93 06/05/2018   HDL 54 06/05/2018   CHOLHDL 2.9 06/05/2018   VLDL 19 06/05/2018   LDLCALC 82 06/05/2018   LDLCALC 90 12/30/2017    Physical Findings: AIMS: Facial and Oral Movements Muscles of Facial Expression: None, normal Lips and Perioral Area: None, normal Jaw: None, normal Tongue: None, normal,Extremity Movements Upper (arms, wrists, hands, fingers): None, normal Lower (legs, knees, ankles, toes): None, normal, Trunk Movements Neck, shoulders, hips: None, normal, Overall Severity Severity of abnormal movements (highest score from questions above): None, normal Incapacitation due to abnormal movements: None, normal Patient's awareness of abnormal movements (rate only patient's report): No Awareness, Dental Status Current problems with teeth and/or dentures?: Yes Does patient usually wear dentures?: No  CIWA:    COWS:  COWS Total Score: 0  Musculoskeletal: Strength & Muscle Tone: within normal limits Gait & Station: normal Patient leans: N/A  Psychiatric Specialty Exam: Physical Exam  Nursing note and vitals reviewed. Constitutional: She appears well-developed and well-nourished.  HENT:  Head: Normocephalic and atraumatic.  Eyes: Pupils are equal, round, and reactive to light. Conjunctivae are normal.  Neck: Normal range of motion.  Cardiovascular: Regular rhythm and normal heart sounds.  Respiratory: Effort normal. No respiratory distress.  GI: Soft.  Musculoskeletal: Normal range of motion.  Neurological: She is  alert.  Skin: Skin is warm and dry.  Psychiatric: She has a normal mood and affect. Her behavior is normal. Judgment and thought content normal.    Review of Systems  Constitutional: Negative.   HENT: Negative.   Eyes: Negative.   Respiratory: Negative.   Cardiovascular: Negative.   Gastrointestinal: Negative.   Musculoskeletal: Negative.   Skin: Negative.   Neurological: Negative.   Psychiatric/Behavioral: Negative.     Blood pressure 120/66, pulse 81, temperature 98.5 F (36.9 C), temperature source Oral, resp. rate 18, height 4\' 10"  (1.473 m), weight 71.7 kg, SpO2 98 %.Body mass index is 33.02 kg/m.  General Appearance: Casual  Eye Contact:  Fair  Speech:  Slow  Volume:  Decreased  Mood:  Euthymic  Affect:  Congruent  Thought Process:  Goal Directed  Orientation:  Full (Time, Place, and Person)  Thought Content:  Logical and Rumination  Suicidal Thoughts:  No  Homicidal Thoughts:  No  Memory:  Immediate;   Fair Recent;   Fair Remote;   Fair  Judgement:  Fair  Insight:  Fair  Psychomotor Activity:  Decreased  Concentration:  Concentration: Fair  Recall:  [employer] of Knowledge:  Fair  Language:  Fair  Akathisia:  No  Handed:  Right  AIMS (if indicated):     Assets:  Desire for Improvement Housing Physical Health Resilience  ADL's:  Intact  Cognition:  Impaired,  Mild  Sleep:  Number of Hours: 5.25     Treatment Plan Summary: Daily contact with patient to assess and evaluate symptoms and progress in treatment, Medication management and Plan Mentally she is doing better.  After talking with me today she agreed to change the [place] back to [place] given on a twice daily with meals dosing schedule.  Her diabetes is under reasonably good control and  her health seems stable.  Continue engagement in groups with psychoeducation and supportive therapy and likely discharge after the weekend if she remains stable  Alethia Berthold, MD 06/17/2018, 3:58 PM

## 2018-06-17 NOTE — Plan of Care (Signed)
Patient was agitated with a few of her peers and was raising her voice at them. Patient was de-escalated and given education.   Problem: Education: Goal: Emotional status will improve Outcome: Not Progressing Goal: Mental status will improve Outcome: Not Progressing

## 2018-06-18 LAB — GLUCOSE, CAPILLARY
Glucose-Capillary: 174 mg/dL — ABNORMAL HIGH (ref 70–99)
Glucose-Capillary: 142 mg/dL — ABNORMAL HIGH (ref 70–99)

## 2018-06-18 NOTE — Plan of Care (Signed)
Pt states her sleep was "good after that sleeping shot". Pt rates depression and anxiety both at 10/10. Pt has SI "to hang myself with these covers on the bed". I removed all linen. Pt denies HI. Pt says AVH are present and negative in nature, I made staff aware and MD. We will continue to monitor to ensure safety. Pt was educated on care plan and verbalizes understanding. Collier Bullock RN  Problem: Education: Goal: Knowledge of [place] [facility] Education information/materials will improve Outcome: Progressing Goal: Emotional status will improve Outcome: Not Progressing Goal: Mental status will improve Outcome: Not Progressing Goal: Verbalization of understanding the information provided will improve Outcome: Not Progressing   Problem: Activity: Goal: Interest or engagement in activities will improve Outcome: Progressing Goal: Sleeping patterns will improve Outcome: Progressing   Problem: Coping: Goal: Ability to verbalize frustrations and anger appropriately will improve Outcome: Not Progressing Goal: Ability to demonstrate self-control will improve Outcome: Not Progressing   Problem: Coping: Goal: Ability to verbalize frustrations and anger appropriately will improve Outcome: Not Progressing Goal: Ability to demonstrate self-control will improve Outcome: Not Progressing   Problem: Health Behavior/Discharge Planning: Goal: Identification of resources available to assist in meeting health care needs will improve Outcome: Progressing Goal: Compliance with treatment plan for underlying cause of condition will improve Outcome: Progressing   Problem: Physical Regulation: Goal: Ability to maintain clinical measurements within normal limits will improve Outcome: Progressing   Problem: Safety: Goal: Periods of time without injury will increase Outcome: Progressing

## 2018-06-18 NOTE — Progress Notes (Signed)
[facility] MD Progress Note  06/18/2018 12:15 PM Jessica [name]  MRN:  [id number] Subjective: Follow-up this patient with schizophrenia and intellectual disability.  Last night she got herself worked up into a tizzy after a confrontation with another patient.  She started going off about how she was going to kill her self and tried to put a pillow over her face.  Did not actually seem to be at any risk to do herself serious harm but more seeking attention.  Patient was eventually given 2 mg of Ativan IM to calm down and get to sleep.  Today she is still sedated from the Ativan but able to talk about the incident.  Has some insight into how she overreacted but still feels like her feelings were hurt.  Denies acute suicidal ideation.  We talked about how this is a typical problem for her that keeps her from being able to be stable. Principal Problem: Adjustment disorder with mixed disturbance of emotions and conduct Diagnosis: Principal Problem:   Adjustment disorder with mixed disturbance of emotions and conduct Active Problems:   Schizophrenia (HCC)   Diabetes mellitus without complication (HCC)   Mild intellectual disability   Borderline personality disorder (HCC)   HTN (hypertension)   Homicidal thoughts  Total Time spent with patient: 30 minutes  Past Psychiatric History: Patient has a history of schizophrenia and intellectual disability with probably the biggest issue being her inability to stay calm during a stressful situation losing her temper and either getting aggressive or hurting herself  Past Medical History:  Past Medical History:  Diagnosis Date  . Anxiety   . Depression   . Diabetes mellitus without complication ([place])   . GERD (gastroesophageal reflux disease)   . Hypertension    History reviewed. No pertinent surgical history. Family History: History reviewed. No pertinent family history. Family Psychiatric  History: None reported Social History:  Social History   Substance  and Sexual Activity  Alcohol Use No  . Frequency: Never     Social History   Substance and Sexual Activity  Drug Use Yes  . Types: Marijuana, "Crack" cocaine   Comment: reports she has not done anything in a long time    Social History   Socioeconomic History  . Marital status: Single    Spouse name: Not on file  . Number of children: Not on file  . Years of education: Not on file  . Highest education level: Not on file  Occupational History  . Not on file  Social Needs  . Financial resource strain: Not on file  . Food insecurity:    Worry: Not on file    Inability: Not on file  . Transportation needs:    Medical: Not on file    Non-medical: Not on file  Tobacco Use  . Smoking status: Former Research scientist (life sciences)  . Smokeless tobacco: Never Used  Substance and Sexual Activity  . Alcohol use: No    Frequency: Never  . Drug use: Yes    Types: Marijuana, "Crack" cocaine    Comment: reports she has not done anything in a long time  . Sexual activity: Not Currently    Birth control/protection: Abstinence  Lifestyle  . Physical activity:    Days per week: Not on file    Minutes per session: Not on file  . Stress: Not on file  Relationships  . Social connections:    Talks on phone: Not on file    Gets together: Not on file  Attends religious service: Not on file    Active member of club or organization: Not on file    Attends meetings of clubs or organizations: Not on file    Relationship status: Not on file  Other Topics Concern  . Not on file  Social History Narrative  . Not on file   Additional Social History:                         Sleep: Good  Appetite:  Fair  Current Medications: Current Facility-Administered Medications  Medication Dose Route Frequency Provider Last Rate Last Dose  . acetaminophen (TYLENOL) tablet 650 mg  650 mg Oral Q6H PRN Lavella Hammock, MD      . alum & mag hydroxide-simeth (MAALOX/MYLANTA) 200-200-20 MG/5ML suspension 30 mL   30 mL Oral Q4H PRN Lavella Hammock, MD      . atorvastatin (LIPITOR) tablet 10 mg  10 mg Oral Daily Lavella Hammock, MD   10 mg at 06/18/18 0820  . carvedilol (COREG) tablet 6.25 mg  6.25 mg Oral BID Lavella Hammock, MD   6.25 mg at 06/18/18 6834  . DULoxetine (CYMBALTA) DR capsule 60 mg  60 mg Oral Daily Lavella Hammock, MD   60 mg at 06/18/18 1962  . ferrous sulfate tablet 325 mg  325 mg Oral BID Lavella Hammock, MD   325 mg at 06/18/18 0818  . gabapentin (NEURONTIN) tablet 600 mg  600 mg Oral TID Lavella Hammock, MD   600 mg at 06/18/18 1140  . insulin detemir (LEVEMIR) injection 15 Units  15 Units Subcutaneous BID Andrik Sandt, Madie Reno, MD   15 Units at 06/18/18 [phone]  . lamoTRIgine (LAMICTAL) tablet 100 mg  100 mg Oral QHS Lavella Hammock, MD   100 mg at 06/17/18 2143  . levothyroxine (SYNTHROID) tablet 150 mcg  150 mcg Oral Q0600 Lavella Hammock, MD   150 mcg at 06/18/18 0605  . lisinopril (ZESTRIL) tablet 10 mg  10 mg Oral Daily Lavella Hammock, MD   10 mg at 06/17/18 0810  . loratadine (CLARITIN) tablet 10 mg  10 mg Oral Daily Lavella Hammock, MD   10 mg at 06/18/18 9892  . lurasidone (LATUDA) tablet 40 mg  40 mg Oral BID WC Terre Hanneman, Madie Reno, MD   40 mg at 06/18/18 0818  . magnesium hydroxide (MILK OF MAGNESIA) suspension 30 mL  30 mL Oral Daily PRN Lavella Hammock, MD      . metFORMIN (GLUCOPHAGE) tablet 500 mg  500 mg Oral BID Lavella Hammock, MD   500 mg at 06/18/18 1194  . pantoprazole (PROTONIX) EC tablet 40 mg  40 mg Oral Daily Lavella Hammock, MD   40 mg at 06/18/18 0818  . traZODone (DESYREL) tablet 100 mg  100 mg Oral QHS Lavella Hammock, MD   100 mg at 06/17/18 2143    Lab Results:  Results for orders placed or performed during the hospital encounter of 06/15/18 (from the past 48 hour(s))  Glucose, capillary     Status: Abnormal   Collection Time: 06/17/18  7:01 AM  Result Value Ref Range   Glucose-Capillary 126 (H) 70 - 99 mg/dL   Comment 1 Notify RN   Glucose,  capillary     Status: Abnormal   Collection Time: 06/18/18  6:56 AM  Result Value Ref Range   Glucose-Capillary 174 (H) 70 - 99 mg/dL  Comment 1 Notify RN     Blood Alcohol level:  Lab Results  Component Value Date   ETH <10 06/14/2018   ETH <10 38/45/3646    Metabolic Disorder Labs: Lab Results  Component Value Date   HGBA1C 5.3 06/05/2018   MPG 105.41 06/05/2018   MPG 102.54 12/30/2017   No results found for: PROLACTIN Lab Results  Component Value Date   CHOL 155 06/05/2018   TRIG 93 06/05/2018   HDL 54 06/05/2018   CHOLHDL 2.9 06/05/2018   VLDL 19 06/05/2018   LDLCALC 82 06/05/2018   LDLCALC 90 12/30/2017    Physical Findings: AIMS: Facial and Oral Movements Muscles of Facial Expression: None, normal Lips and Perioral Area: None, normal Jaw: None, normal Tongue: None, normal,Extremity Movements Upper (arms, wrists, hands, fingers): None, normal Lower (legs, knees, ankles, toes): None, normal, Trunk Movements Neck, shoulders, hips: None, normal, Overall Severity Severity of abnormal movements (highest score from questions above): None, normal Incapacitation due to abnormal movements: None, normal Patient's awareness of abnormal movements (rate only patient's report): No Awareness, Dental Status Current problems with teeth and/or dentures?: Yes Does patient usually wear dentures?: No  CIWA:    COWS:  COWS Total Score: 0  Musculoskeletal: Strength & Muscle Tone: within normal limits Gait & Station: normal Patient leans: N/A  Psychiatric Specialty Exam: Physical Exam  Nursing note and vitals reviewed. Constitutional: She appears well-developed and well-nourished.  HENT:  Head: Normocephalic and atraumatic.  Eyes: Pupils are equal, round, and reactive to light. Conjunctivae are normal.  Neck: Normal range of motion.  Cardiovascular: Regular rhythm and normal heart sounds.  Respiratory: Effort normal. No respiratory distress.  GI: Soft.   Musculoskeletal: Normal range of motion.  Neurological: She is alert.  Skin: Skin is warm and dry.  Psychiatric: Her affect is blunt. Her speech is delayed. She is slowed and withdrawn. She expresses impulsivity and inappropriate judgment. She expresses no homicidal and no suicidal ideation. She exhibits abnormal recent memory and abnormal remote memory.    Review of Systems  Constitutional: Negative.   HENT: Negative.   Eyes: Negative.   Respiratory: Negative.   Cardiovascular: Negative.   Gastrointestinal: Negative.   Musculoskeletal: Negative.   Skin: Negative.   Neurological: Negative.   Psychiatric/Behavioral: Positive for depression. Negative for hallucinations, memory loss, substance abuse and suicidal ideas. The patient is nervous/anxious. The patient does not have insomnia.     Blood pressure (!) 91/54, pulse 88, temperature 98.4 F (36.9 C), temperature source Oral, resp. rate 18, height 4\' 10"  (1.473 m), weight 71.7 kg, SpO2 92 %.Body mass index is 33.02 kg/m.  General Appearance: Casual  Eye Contact:  Fair  Speech:  Slow  Volume:  Decreased  Mood:  Depressed and Dysphoric  Affect:  Congruent  Thought Process:  Coherent  Orientation:  Full (Time, Place, and Person)  Thought Content:  Rumination and Tangential  Suicidal Thoughts:  No  Homicidal Thoughts:  No  Memory:  Immediate;   Fair Recent;   Poor Remote;   Fair  Judgement:  Impaired  Insight:  Shallow  Psychomotor Activity:  Decreased  Concentration:  Concentration: Poor  Recall:  [employer] of Knowledge:  Fair  Language:  Fair  Akathisia:  No  Handed:  Right  AIMS (if indicated):     Assets:  Desire for Improvement Financial Resources/Insurance Resilience  ADL's:  Intact  Cognition:  Impaired,  Mild  Sleep:  Number of Hours: 5     Treatment Plan  Summary: Daily contact with patient to assess and evaluate symptoms and progress in treatment, Medication management and Plan Patient does not appear to  be at acute risk to really hurt herself although she does have a tendency to get into fights that can escalate.  It does not take very much to hurt her feelings and she cannot really get herself under control well.  We talked for a while about how that needs to be very clearly her goal to work on controlling her temper.  Patient obviously is very's sensitive to the Ativan and we will trying to avoid that in the future if possible.  We are still hoping for discharge on Monday.  No change to underlying psychiatric medicine.  Alethia Berthold, MD 06/18/2018, 12:15 PM

## 2018-06-18 NOTE — Progress Notes (Signed)
D - Patient was in the day room upon arrival to the unit. Patient had a confrontation with another patient this evening. Patient came and talked to this Probation officer and said she was just going to pull herself out of the situation and try to not let it bother her. Patient said another patient was picking on her and she couldn't take it. Patient went back down to the dayroom to watch TV. Another patient came and was picking on her so the decision was made to close the dayroom early. Patient became upset and started crying saying she didn't do anything wrong and this isn't fair. Patient was very childlike. Patient was given education. Patient said she wanted to kill herself, saying she couldn't take them picking on her any more. Patient was de-escalated then went back to her room. Patient then used the call bell but didn't respond. Upon assessment by this writer and a MHT the patient was holding a pillow over her face. Patient was calmed down by talking to her and was given medications. (See MAR) Patient was attention seeking and didn't appear to really be trying to hurt herself. After talking to her and giving her medications the patient apologized and said she is here to work on her anger and hopes to get better with it. Patient continue to be monitored and remains safe on the unit.   A - Patient compliant with medication administration per MD orders. Patient given education. Patient given support and encouragement to be active in her treatment plan. Patient informed to let staff know if there are any issues or problems on the unit.   R - Patient being monitored Q 15 minutes for safety per unit protocol. Patient remains safe on the unit.

## 2018-06-18 NOTE — Progress Notes (Signed)
Pt does not have SI anymore. She verbalized and agreement to not harm herself. Will continue to monitor. Collier Bullock RN

## 2018-06-18 NOTE — BHH Group Notes (Signed)
LCSW Group Therapy Note  06/18/2018 1:15pm  Type of Therapy and Topic:  Group Therapy:  Cognitive Distortions  Participation Level:  Did Not Attend   Description of Group:    Patients in this group will be introduced to the topic of cognitive distortions.  Patients will identify and describe cognitive distortions, describe the feelings these distortions create for them.  Patients will identify one or more situations in their personal life where they have cognitively distorted thinking and will verbalize challenging this cognitive distortion through positive thinking skills.  Patients will practice the skill of using positive affirmations to challenge cognitive distortions using affirmation cards.    Therapeutic Goals:  1. Patient will identify two or more cognitive distortions they have used 2. Patient will identify one or more emotions that stem from use of a cognitive distortion 3. Patient will demonstrate use of a positive affirmation to counter a cognitive distortion through discussion and/or role play. 4. Patient will describe one way cognitive distortions can be detrimental to wellness   Summary of Patient Progress: Pt was invited to attend group but chose not to attend. CSW will continue to encourage pt to attend group throughout their admission.      Therapeutic Modalities:   Cognitive Behavioral Therapy Motivational Interviewing   Nala Kachel  CUEBAS-COLON, LCSW 06/18/2018 12:40 PM

## 2018-06-18 NOTE — Progress Notes (Signed)
Ch visited w/ pt as requested by nurse. Nurse shared that pt was wanting to talk to a chaplain but also a [place] priest. Nursed shared that pt has been to [facility] several times. Pt was cordial and appreciated the presence of a chaplain. Ch encouraged pt to express her grief related to losing her mom a year ago and to have compassion for herself regarding having a mental disorder. Pt shared that she hopes to work on her anger issues and continue on her meds that help with her mood swings. Pt has come to a place of acceptance with her disease and has a positive outlook about going back to group home where she feels loved and supported. F/u should include time for pt to explore spiritual beliefs [facility]) and communicate her concerns and what causes her stress and tension.    06/18/18 1800  Clinical Encounter Type  Visited With Patient  Visit Type Psychological support;Spiritual support;Social support  Referral From Nurse  Consult/Referral To Chaplain  Spiritual Encounters  Spiritual Needs Emotional;Grief support  Stress Factors  Patient Stress Factors Health changes;Loss;Loss of control;Major life changes  Family Stress Factors None identified

## 2018-06-18 NOTE — Plan of Care (Signed)
Patient was upset this evening, crying saying another patient was picking on her. The day room was shut down early because of it and the patient got very upset and was very childlike.   Problem: Education: Goal: Mental status will improve Outcome: Not Progressing

## 2018-06-19 LAB — GLUCOSE, CAPILLARY: Glucose-Capillary: 166 mg/dL — ABNORMAL HIGH (ref 70–99)

## 2018-06-19 MED ORDER — LURASIDONE HCL 40 MG PO TABS: 40.0000 mg | ORAL_TABLET | Freq: Two times a day (BID) | ORAL | 1 refills | Status: DC

## 2018-06-19 MED ORDER — LORATADINE 10 MG PO TABS: 10.0000 mg | ORAL_TABLET | Freq: Every day | ORAL | 1 refills | Status: DC

## 2018-06-19 MED ORDER — DULOXETINE HCL 60 MG PO CPEP: 60.0000 mg | ORAL_CAPSULE | Freq: Every day | ORAL | 1 refills | Status: DC

## 2018-06-19 MED ORDER — ATORVASTATIN CALCIUM 10 MG PO TABS: 10.0000 mg | ORAL_TABLET | Freq: Every day | ORAL | 1 refills | Status: DC

## 2018-06-19 MED ORDER — INSULIN DETEMIR 100 UNIT/ML ~~LOC~~ SOLN: 15.0000 [IU] | Freq: Two times a day (BID) | SUBCUTANEOUS | 1 refills | Status: DC

## 2018-06-19 MED ORDER — CARVEDILOL 6.25 MG PO TABS: 6.2500 mg | ORAL_TABLET | Freq: Two times a day (BID) | ORAL | 1 refills | Status: DC

## 2018-06-19 MED ORDER — LAMOTRIGINE 100 MG PO TABS: 100.0000 mg | ORAL_TABLET | Freq: Every day | ORAL | 1 refills | Status: DC

## 2018-06-19 MED ORDER — METFORMIN HCL 500 MG PO TABS: 500.0000 mg | ORAL_TABLET | Freq: Two times a day (BID) | ORAL | 1 refills | Status: DC

## 2018-06-19 MED ORDER — GABAPENTIN 600 MG PO TABS: 600.0000 mg | ORAL_TABLET | Freq: Three times a day (TID) | ORAL | 1 refills | Status: DC

## 2018-06-19 MED ORDER — LISINOPRIL 10 MG PO TABS: 10.0000 mg | ORAL_TABLET | Freq: Every day | ORAL | 1 refills | Status: DC

## 2018-06-19 MED ORDER — FERROUS SULFATE 325 (65 FE) MG PO TABS: 325.0000 mg | ORAL_TABLET | Freq: Two times a day (BID) | ORAL | 1 refills | Status: DC

## 2018-06-19 MED ORDER — PANTOPRAZOLE SODIUM 40 MG PO TBEC: 40.0000 mg | DELAYED_RELEASE_TABLET | Freq: Every day | ORAL | 1 refills | Status: DC

## 2018-06-19 MED ORDER — TRAZODONE HCL 100 MG PO TABS: 100.0000 mg | ORAL_TABLET | Freq: Every day | ORAL | 1 refills | Status: DC

## 2018-06-19 MED ORDER — LEVOTHYROXINE SODIUM 150 MCG PO TABS: 150.0000 ug | ORAL_TABLET | Freq: Every day | ORAL | 1 refills | Status: DC

## 2018-06-19 NOTE — BHH Suicide Risk Assessment (Signed)
[facility] Discharge Suicide Risk Assessment   Principal Problem: Adjustment disorder with mixed disturbance of emotions and conduct Discharge Diagnoses: Principal Problem:   Adjustment disorder with mixed disturbance of emotions and conduct Active Problems:   Schizophrenia ([place])   Diabetes mellitus without complication (HCC)   Mild intellectual disability   Borderline personality disorder (HCC)   HTN (hypertension)   Homicidal thoughts   Total Time spent with patient: 45 minutes  Musculoskeletal: Strength & Muscle Tone: within normal limits Gait & Station: normal Patient leans: N/A  Psychiatric Specialty Exam: Review of Systems  Constitutional: Negative.   HENT: Negative.   Eyes: Negative.   Respiratory: Negative.   Cardiovascular: Negative.   Gastrointestinal: Negative.   Musculoskeletal: Negative.   Skin: Negative.   Neurological: Negative.   Psychiatric/Behavioral: Negative.     Blood pressure (!) 117/57, pulse 86, temperature 97.8 F (36.6 C), temperature source Oral, resp. rate 18, height 4\' 10"  (1.473 m), weight 71.7 kg, SpO2 98 %.Body mass index is 33.02 kg/m.  General Appearance: Casual  Eye Contact::  Good  Speech:  Normal Rate409  Volume:  Normal  Mood:  Euthymic  Affect:  Congruent  Thought Process:  Goal Directed  Orientation:  Full (Time, Place, and Person)  Thought Content:  Logical  Suicidal Thoughts:  No  Homicidal Thoughts:  No  Memory:  Immediate;   Fair Recent;   Fair Remote;   Fair  Judgement:  Fair  Insight:  Fair  Psychomotor Activity:  Normal  Concentration:  Fair  Recall:  [employer] of [place]  Language: Fair  Akathisia:  No  Handed:  Right  AIMS (if indicated):     Assets:  Desire for Improvement Housing Resilience  Sleep:  Number of Hours: 6  Cognition: Impaired,  Mild  ADL's:  Intact   Mental Status Per Nursing Assessment::   On Admission:  NA  Demographic Factors:  NA  Loss Factors: Financial problems/change in  socioeconomic status  Historical Factors: Prior suicide attempts and Impulsivity  Risk Reduction Factors:   Living with another person, especially a relative, Positive social support and Positive therapeutic relationship  Continued Clinical Symptoms:  Schizophrenia:   Paranoid or undifferentiated type  Cognitive Features That Contribute To Risk:  None    Suicide Risk:  Minimal: No identifiable suicidal ideation.  Patients presenting with no risk factors but with morbid ruminations; may be classified as minimal risk based on the severity of the depressive symptoms  Follow-up Information    [facility] [employer] Follow up on 06/24/2018.   Why:  Please follow up with [facility] on Friday, June 5th at 10:00am. Please take your hospital discharge paperwork with you to your appointment.  Thank you. Contact information: [place] [address] [phone]           Plan Of Care/Follow-up recommendations:  Activity:  Activity as tolerated Diet:  Regular diet Other:  She has follow-up at [facility].  I will prepare prescriptions for her.  Patient counseled about continuing to work on controlling her temper which has been her biggest challenge.  Alethia Berthold, MD 06/19/2018, 10:09 AM

## 2018-06-19 NOTE — Plan of Care (Signed)
Patient observed to express attention seeking behavior frequently upon which she was given emotional support and compliance with small tasks improved. She has demonstrated ability to learn regimen and cooperate with treatment plan. Observed  to interact appropriately with group. She has learned her medications and the schedule upon which she is to take them. Denies SI/HI. Problem: Education: Goal: Knowledge of [place] [facility] Education information/materials will improve Outcome: Progressing Goal: Emotional status will improve Outcome: Progressing Goal: Mental status will improve Outcome: Progressing Goal: Verbalization of understanding the information provided will improve Outcome: Progressing   Problem: Health Behavior/Discharge Planning: Goal: Identification of resources available to assist in meeting health care needs will improve Outcome: Progressing Goal: Compliance with treatment plan for underlying cause of condition will improve Outcome: Progressing   Problem: Physical Regulation: Goal: Ability to maintain clinical measurements within normal limits will improve Outcome: Progressing   Problem: Safety: Goal: Periods of time without injury will increase Outcome: Progressing

## 2018-06-19 NOTE — Progress Notes (Signed)
D- Patient alert and oriented. Affect/mood are pleasant. Denies SI, and pain. Quotes. Goal:  I want to go home and I know that means I have to behave in a different way. She recognized that her behavior can seriously affect her achieving goals in a satisfactory manner. A- Scheduled medications administered to patient, per MD orders. Support and encouragement provided.  Routine safety checks conducted every 15 minutes.  Patient informed to notify staff with problems or concerns. R- No adverse drug reactions noted. Patient contracts for safety at this time. Patient compliant with medications and treatment plan. Patient continues attention seeking behavior but decreased during this shift. Patient interacts well with others on the unit.  Patient remains safe at this time.

## 2018-06-19 NOTE — BHH Group Notes (Signed)
LCSW Group Therapy Note 06/19/2018 1:15pm  Type of Therapy and Topic: Group Therapy: Feelings Around Returning Home & Establishing a Supportive Framework and Supporting Oneself When Supports Not Available  Participation Level: Active  Description of Group:  Patients first processed thoughts and feelings about upcoming discharge. These included fears of upcoming changes, lack of change, new living environments, judgements and expectations from others and overall stigma of mental health issues. The group then discussed the definition of a supportive framework, what that looks and feels like, and how do to discern it from an unhealthy non-supportive network. The group identified different types of supports as well as what to do when your family/friends are less than helpful or unavailable  Therapeutic Goals  1. Patient will identify one healthy supportive network that they can use at discharge. 2. Patient will identify one factor of a supportive framework and how to tell it from an unhealthy network. 3. Patient able to identify one coping skill to use when they do not have positive supports from others. 4. Patient will demonstrate ability to communicate their needs through discussion and/or role plays.  Summary of Patient Progress:  The patient reported she feels "okay." Pt engaged during group session. As patients processed their anxiety about discharge and described healthy supports patient shared she is ready to be discharge.  Patients identified at least one self-care tool they were willing to use after discharge.   Therapeutic Modalities Cognitive Behavioral Therapy Motivational Interviewing   Jessica [name]  CUEBAS-COLON, LCSW 06/19/2018 10:34 AM

## 2018-06-19 NOTE — Progress Notes (Signed)
[facility] MD Progress Note  06/19/2018 10:06 AM Jessica [name]  MRN:  [id number] Subjective: Patient seen chart reviewed.  Patient is very pleased with herself for not having had a spell of agitation or aggression last night.  I agreed with her and offered her a lot of praise around that.  She says she feels like her mood is better.  She is still very anxious to know she is going home tomorrow.  I reassured her that that remains the plan.  No remarkable change in vital signs no new labs.  No indication for changing medicine today we will begin preparations for likely return to the group home tomorrow Principal Problem: Adjustment disorder with mixed disturbance of emotions and conduct Diagnosis: Principal Problem:   Adjustment disorder with mixed disturbance of emotions and conduct Active Problems:   Schizophrenia (HCC)   Diabetes mellitus without complication (HCC)   Mild intellectual disability   Borderline personality disorder (HCC)   HTN (hypertension)   Homicidal thoughts  Total Time spent with patient: 20 minutes  Past Psychiatric History: Past history of recurrent behavior problems related to intellectual disability schizophrenia and chronic personality features  Past Medical History:  Past Medical History:  Diagnosis Date  . Anxiety   . Depression   . Diabetes mellitus without complication ([place])   . GERD (gastroesophageal reflux disease)   . Hypertension    History reviewed. No pertinent surgical history. Family History: History reviewed. No pertinent family history. Family Psychiatric  History: None known Social History:  Social History   Substance and Sexual Activity  Alcohol Use No  . Frequency: Never     Social History   Substance and Sexual Activity  Drug Use Yes  . Types: Marijuana, "Crack" cocaine   Comment: reports she has not done anything in a long time    Social History   Socioeconomic History  . Marital status: Single    Spouse name: Not on file  .  Number of children: Not on file  . Years of education: Not on file  . Highest education level: Not on file  Occupational History  . Not on file  Social Needs  . Financial resource strain: Not on file  . Food insecurity:    Worry: Not on file    Inability: Not on file  . Transportation needs:    Medical: Not on file    Non-medical: Not on file  Tobacco Use  . Smoking status: Former Research scientist (life sciences)  . Smokeless tobacco: Never Used  Substance and Sexual Activity  . Alcohol use: No    Frequency: Never  . Drug use: Yes    Types: Marijuana, "Crack" cocaine    Comment: reports she has not done anything in a long time  . Sexual activity: Not Currently    Birth control/protection: Abstinence  Lifestyle  . Physical activity:    Days per week: Not on file    Minutes per session: Not on file  . Stress: Not on file  Relationships  . Social connections:    Talks on phone: Not on file    Gets together: Not on file    Attends religious service: Not on file    Active member of club or organization: Not on file    Attends meetings of clubs or organizations: Not on file    Relationship status: Not on file  Other Topics Concern  . Not on file  Social History Narrative  . Not on file   Additional Social History:  Sleep: Fair  Appetite:  Fair  Current Medications: Current Facility-Administered Medications  Medication Dose Route Frequency Provider Last Rate Last Dose  . acetaminophen (TYLENOL) tablet 650 mg  650 mg Oral Q6H PRN Lavella Hammock, MD      . alum & mag hydroxide-simeth (MAALOX/MYLANTA) 200-200-20 MG/5ML suspension 30 mL  30 mL Oral Q4H PRN Lavella Hammock, MD      . atorvastatin (LIPITOR) tablet 10 mg  10 mg Oral Daily Lavella Hammock, MD   10 mg at 06/19/18 0800  . carvedilol (COREG) tablet 6.25 mg  6.25 mg Oral BID Lavella Hammock, MD   6.25 mg at 06/19/18 0800  . DULoxetine (CYMBALTA) DR capsule 60 mg  60 mg Oral Daily Lavella Hammock, MD    60 mg at 06/19/18 0759  . ferrous sulfate tablet 325 mg  325 mg Oral BID Lavella Hammock, MD   325 mg at 06/19/18 0759  . gabapentin (NEURONTIN) tablet 600 mg  600 mg Oral TID Lavella Hammock, MD   600 mg at 06/19/18 0759  . insulin detemir (LEVEMIR) injection 15 Units  15 Units Subcutaneous BID Onda Kattner, Madie Reno, MD   15 Units at 06/19/18 0801  . lamoTRIgine (LAMICTAL) tablet 100 mg  100 mg Oral QHS Lavella Hammock, MD   100 mg at 06/18/18 2207  . levothyroxine (SYNTHROID) tablet 150 mcg  150 mcg Oral Q0600 Lavella Hammock, MD   150 mcg at 06/19/18 0543  . lisinopril (ZESTRIL) tablet 10 mg  10 mg Oral Daily Lavella Hammock, MD   10 mg at 06/19/18 0800  . loratadine (CLARITIN) tablet 10 mg  10 mg Oral Daily Lavella Hammock, MD   10 mg at 06/19/18 0759  . lurasidone (LATUDA) tablet 40 mg  40 mg Oral BID WC Adekunle Rohrbach, Madie Reno, MD   40 mg at 06/19/18 0802  . magnesium hydroxide (MILK OF MAGNESIA) suspension 30 mL  30 mL Oral Daily PRN Lavella Hammock, MD      . metFORMIN (GLUCOPHAGE) tablet 500 mg  500 mg Oral BID Lavella Hammock, MD   500 mg at 06/19/18 0759  . pantoprazole (PROTONIX) EC tablet 40 mg  40 mg Oral Daily Lavella Hammock, MD   40 mg at 06/19/18 0800  . traZODone (DESYREL) tablet 100 mg  100 mg Oral QHS Lavella Hammock, MD   100 mg at 06/18/18 2208    Lab Results:  Results for orders placed or performed during the hospital encounter of 06/15/18 (from the past 48 hour(s))  Glucose, capillary     Status: Abnormal   Collection Time: 06/18/18  6:56 AM  Result Value Ref Range   Glucose-Capillary 174 (H) 70 - 99 mg/dL   Comment 1 Notify RN   Glucose, capillary     Status: Abnormal   Collection Time: 06/18/18  9:17 PM  Result Value Ref Range   Glucose-Capillary 142 (H) 70 - 99 mg/dL   Comment 1 Notify RN   Glucose, capillary     Status: Abnormal   Collection Time: 06/19/18  6:54 AM  Result Value Ref Range   Glucose-Capillary 166 (H) 70 - 99 mg/dL   Comment 1 Notify RN      Blood Alcohol level:  Lab Results  Component Value Date   ETH <10 06/14/2018   ETH <10 69/62/9528    Metabolic Disorder Labs: Lab Results  Component Value Date   HGBA1C 5.3 06/05/2018  MPG 105.41 06/05/2018   MPG 102.54 12/30/2017   No results found for: PROLACTIN Lab Results  Component Value Date   CHOL 155 06/05/2018   TRIG 93 06/05/2018   HDL 54 06/05/2018   CHOLHDL 2.9 06/05/2018   VLDL 19 06/05/2018   LDLCALC 82 06/05/2018   LDLCALC 90 12/30/2017    Physical Findings: AIMS: Facial and Oral Movements Muscles of Facial Expression: None, normal Lips and Perioral Area: None, normal Jaw: None, normal Tongue: None, normal,Extremity Movements Upper (arms, wrists, hands, fingers): None, normal Lower (legs, knees, ankles, toes): None, normal, Trunk Movements Neck, shoulders, hips: None, normal, Overall Severity Severity of abnormal movements (highest score from questions above): None, normal Incapacitation due to abnormal movements: None, normal Patient's awareness of abnormal movements (rate only patient's report): No Awareness, Dental Status Current problems with teeth and/or dentures?: Yes Does patient usually wear dentures?: No  CIWA:    COWS:  COWS Total Score: 0  Musculoskeletal: Strength & Muscle Tone: within normal limits Gait & Station: normal Patient leans: N/A  Psychiatric Specialty Exam: Physical Exam  Nursing note and vitals reviewed. Constitutional: She appears well-developed and well-nourished.  HENT:  Head: Normocephalic and atraumatic.  Eyes: Pupils are equal, round, and reactive to light. Conjunctivae are normal.  Neck: Normal range of motion.  Cardiovascular: Regular rhythm and normal heart sounds.  Respiratory: Effort normal.  GI: Soft.  Musculoskeletal: Normal range of motion.  Neurological: She is alert.  Skin: Skin is warm and dry.  Psychiatric: Her affect is blunt. Her speech is delayed. She is slowed. Cognition and memory are  impaired. She expresses impulsivity. She expresses no homicidal and no suicidal ideation.    Review of Systems  Constitutional: Negative.   HENT: Negative.   Eyes: Negative.   Respiratory: Negative.   Cardiovascular: Negative.   Gastrointestinal: Negative.   Musculoskeletal: Negative.   Skin: Negative.   Neurological: Negative.   Psychiatric/Behavioral: Negative.     Blood pressure (!) 117/57, pulse 86, temperature 97.8 F (36.6 C), temperature source Oral, resp. rate 18, height 4\' 10"  (1.473 m), weight 71.7 kg, SpO2 98 %.Body mass index is 33.02 kg/m.  General Appearance: Casual  Eye Contact:  Fair  Speech:  Clear and Coherent  Volume:  Normal  Mood:  Dysphoric  Affect:  Constricted  Thought Process:  Goal Directed  Orientation:  Full (Time, Place, and Person)  Thought Content:  Logical  Suicidal Thoughts:  No  Homicidal Thoughts:  No  Memory:  Immediate;   Fair Recent;   Fair Remote;   Fair  Judgement:  Fair  Insight:  Fair  Psychomotor Activity:  Normal  Concentration:  Concentration: Fair  Recall:  [employer] of Knowledge:  Fair  Language:  Fair  Akathisia:  No  Handed:  Right  AIMS (if indicated):     Assets:  Desire for Improvement Housing Physical Health Resilience  ADL's:  Intact  Cognition:  Impaired,  Mild  Sleep:  Number of Hours: 6     Treatment Plan Summary: Daily contact with patient to assess and evaluate symptoms and progress in treatment, Medication management and Plan Patient is doing well.  Gave her a lot of support.  We reviewed how much she liked the [place] being twice a day.  I will start getting prescriptions prepared so that we can plan for discharge to her group home tomorrow.  Alethia Berthold, MD 06/19/2018, 10:06 AM

## 2018-06-19 NOTE — Plan of Care (Signed)
Patient stated that her goal for today is be positive and quit worrying.Patient is proud of herself about her behavior in the unit.No aggressive behaviors noted.Patient stated that she is ready to go back to the group home.In & out of room.Attended groups.Compliant with medications.Appetite and energy level good.Support and encouragement given.

## 2018-06-19 NOTE — BHH Group Notes (Signed)
[place] Group Notes:  (Nursing/MHT/Case Management/Adjunct)  Date:  06/19/2018  Time:  9:43 PM  Type of Therapy:  Group Therapy  Participation Level:  Did Not Attend  Nehemiah Settle 06/19/2018, 9:43 PM

## 2018-06-20 LAB — GLUCOSE, CAPILLARY: Glucose-Capillary: 173 mg/dL — ABNORMAL HIGH (ref 70–99)

## 2018-06-20 NOTE — Progress Notes (Signed)
D- Patient alert and oriented. Friendly affect/pleasant mood. Denies SI, HI, AVH, and pain. Quotes I am ready to go home now. Goal: go back to group home. Has had difficulty in the past in meeting stated goals, support given.Marland Kitchen A- Scheduled medications administered to patient, per MD orders. Support and encouragement provided.  Routine safety checks conducted every 15 minutes.  Patient informed to notify staff with problems or concerns. R- No adverse drug reactions noted. Patient contracts for safety at this time. Patient compliant with medications and treatment plan. Patient receptive, calm, and cooperative. Patient interacts well with others on the unit.  Patient remains safe at this time.

## 2018-06-20 NOTE — Plan of Care (Signed)
Patient observed to have improved mood and positive outlook. Reports looking forward to going back to her group home. Observed to interact appropriately with other patients and staff. Reported to sleep through the night without difficulty.  Problem: Coping: Goal: Ability to demonstrate self-control will improve Outcome: Progressing   Problem: Coping: Goal: Ability to verbalize frustrations and anger appropriately will improve Outcome: Progressing   Problem: Activity: Goal: Sleeping patterns will improve Outcome: Progressing   Problem: Coping: Goal: Ability to verbalize frustrations and anger appropriately will improve Outcome: Progressing   Problem: Coping: Goal: Ability to demonstrate self-control will improve Outcome: Progressing

## 2018-06-20 NOTE — Progress Notes (Signed)
CSW spoke with pts sister Ollen Bowl [phone] who reported that she spoke with pts yesterday and pt was in a "very dark place" and pts voice was different. [name] reported that pt said [name] said she cannot return home and she called and spoke with [name] who reported that pt can return home and just told her that she had to get better. [name] reported that she is concerned about pt discharging because pt seems to keep having negative behaviors and does not know how to react to situations.    Evalina Field, MSW, LCSW Clinical Social Work 06/20/2018 9:57 AM

## 2018-06-20 NOTE — BHH Group Notes (Signed)
Overcoming Obstacles  06/20/2018 1PM  Type of Therapy and Topic:  Group Therapy:  Overcoming Obstacles  Participation Level:  Active    Description of Group:    In this group patients will be encouraged to explore what they see as obstacles to their own wellness and recovery. They will be guided to discuss their thoughts, feelings, and behaviors related to these obstacles. The group will process together ways to cope with barriers, with attention given to specific choices patients can make. Each patient will be challenged to identify changes they are motivated to make in order to overcome their obstacles. This group will be process-oriented, with patients participating in exploration of their own experiences as well as giving and receiving support and challenge from other group members.   Therapeutic Goals: 1. Patient will identify personal and current obstacles as they relate to admission. 2. Patient will identify barriers that currently interfere with their wellness or overcoming obstacles.  3. Patient will identify feelings, thought process and behaviors related to these barriers. 4. Patient will identify two changes they are willing to make to overcome these obstacles:      Summary of Patient Progress  Actively and appropriately engaged in the group. Patient was able to provide support and validation to other group members. Patient identifies anger as an obstacle she needs to work on overcoming. Patient reports she will continue working on practicing anger management.    Therapeutic Modalities:   Cognitive Behavioral Therapy Solution Focused Therapy Motivational Interviewing Relapse Prevention Therapy    Sanjuana Kava, MSW, LCSW 06/20/2018 2:30 PM

## 2018-06-20 NOTE — Progress Notes (Signed)
  [facility] Adult Case Management Discharge Plan :  Will you be returning to the same living situation after discharge:  Yes,  group home At discharge, do you have transportation home?: Yes,  [name] will pick pt up Do you have the ability to pay for your medications: Yes,  medicare insurance  Release of information consent forms completed and in the chart;    Patient to Follow up at: Follow-up Information    [facility] Follow up on 06/24/2018.   Why:  Please follow up with [facility] on Friday, June 5th at 10:00am. Please take your hospital discharge paperwork with you to your appointment.  Thank you. Contact information: [place] [address] [phone]           Next level of care provider has access to [place] and Suicide Prevention discussed: Yes,  SPE completed with pts sister  Have you used any form of tobacco in the last 30 days? (Cigarettes, Smokeless Tobacco, Cigars, and/or Pipes): No  Has patient been referred to the Quitline?: N/A patient is not a smoker  Patient has been referred for addiction treatment: N/A  Delfin Edis, LCSW 06/20/2018, 9:42 AM

## 2018-06-20 NOTE — Discharge Summary (Signed)
Physician Discharge Summary Note  Patient:  Jessica [name] is an 53 y.o., female MRN:  [id number] DOB:  13-Sep-1965 Patient phone:  [phone] (home)  Patient address:   [place] [address],  Total Time spent with patient: 45 minutes  Date of Admission:  06/15/2018 Date of Discharge: June 20, 2018  Reason for Admission: Admitted through the emergency room where she presented after once again becoming agitated at her group home.  Got into a verbal fight was cursing at the group home owner and making a scene.  Principal Problem: Adjustment disorder with mixed disturbance of emotions and conduct Discharge Diagnoses: Principal Problem:   Adjustment disorder with mixed disturbance of emotions and conduct Active Problems:   Schizophrenia ([place])   Diabetes mellitus without complication (HCC)   Mild intellectual disability   Borderline personality disorder (HCC)   HTN (hypertension)   Homicidal thoughts   Past Psychiatric History: History of chronic behavior and mental health problems.  Mild intellectual disability personality disorder features and a history of schizophrenia  Past Medical History:  Past Medical History:  Diagnosis Date  . Anxiety   . Depression   . Diabetes mellitus without complication ([place])   . GERD (gastroesophageal reflux disease)   . Hypertension    History reviewed. No pertinent surgical history. Family History: History reviewed. No pertinent family history. Family Psychiatric  History: None reported Social History:  Social History   Substance and Sexual Activity  Alcohol Use No  . Frequency: Never     Social History   Substance and Sexual Activity  Drug Use Yes  . Types: Marijuana, "Crack" cocaine   Comment: reports she has not done anything in a long time    Social History   Socioeconomic History  . Marital status: Single    Spouse name: Not on file  . Number of children: Not on file  . Years of education: Not on file  .  Highest education level: Not on file  Occupational History  . Not on file  Social Needs  . Financial resource strain: Not on file  . Food insecurity:    Worry: Not on file    Inability: Not on file  . Transportation needs:    Medical: Not on file    Non-medical: Not on file  Tobacco Use  . Smoking status: Former Research scientist (life sciences)  . Smokeless tobacco: Never Used  Substance and Sexual Activity  . Alcohol use: No    Frequency: Never  . Drug use: Yes    Types: Marijuana, "Crack" cocaine    Comment: reports she has not done anything in a long time  . Sexual activity: Not Currently    Birth control/protection: Abstinence  Lifestyle  . Physical activity:    Days per week: Not on file    Minutes per session: Not on file  . Stress: Not on file  Relationships  . Social connections:    Talks on phone: Not on file    Gets together: Not on file    Attends religious service: Not on file    Active member of club or organization: Not on file    Attends meetings of clubs or organizations: Not on file    Relationship status: Not on file  Other Topics Concern  . Not on file  Social History Narrative  . Not on file    Hospital Course: Patient admitted to the psychiatric ward.  Her medicines have been adjusted initially in the emergency room to replace her  Latuda with Invega.  However the patient continued to have some irritability and impulsivity throughout her hospital stay.  Her group home reported they had felt she had been doing better when she actually took [place] twice a day.  I talk with the patient about this and she agreed to change her medicine back.  Stop the Invega and put her on Latuda twice a day.  She still had a tendency to get into arguments and loses her temper with other patients especially when they goaded her or made fun of her.  She recognized that she was having problems with anger management and made efforts to try and improve it.  Patient made suicidal statements occasionally but  at the time of discharge was denying suicidal or homicidal ideation and appeared to be at her baseline in a good mood.  Physical Findings: AIMS: Facial and Oral Movements Muscles of Facial Expression: None, normal Lips and Perioral Area: None, normal Jaw: None, normal Tongue: None, normal,Extremity Movements Upper (arms, wrists, hands, fingers): None, normal Lower (legs, knees, ankles, toes): None, normal, Trunk Movements Neck, shoulders, hips: None, normal, Overall Severity Severity of abnormal movements (highest score from questions above): None, normal Incapacitation due to abnormal movements: None, normal Patient's awareness of abnormal movements (rate only patient's report): No Awareness, Dental Status Current problems with teeth and/or dentures?: Yes Does patient usually wear dentures?: No  CIWA:    COWS:  COWS Total Score: 0  Musculoskeletal: Strength & Muscle Tone: within normal limits Gait & Station: normal Patient leans: N/A  Psychiatric Specialty Exam: Physical Exam  Nursing note and vitals reviewed. Constitutional: She appears well-developed and well-nourished.  HENT:  Head: Normocephalic and atraumatic.  Eyes: Pupils are equal, round, and reactive to light. Conjunctivae are normal.  Neck: Normal range of motion.  Cardiovascular: Regular rhythm and normal heart sounds.  Respiratory: No respiratory distress.  GI: Soft.  Musculoskeletal: Normal range of motion.  Neurological: She is alert.  Skin: Skin is warm and dry.  Psychiatric: She has a normal mood and affect. Her speech is normal and behavior is normal. Thought content normal. Cognition and memory are impaired. She expresses impulsivity. She exhibits abnormal recent memory.    Review of Systems  Constitutional: Negative.   HENT: Negative.   Eyes: Negative.   Respiratory: Negative.   Cardiovascular: Negative.   Gastrointestinal: Negative.   Musculoskeletal: Negative.   Skin: Negative.   Neurological:  Negative.   Psychiatric/Behavioral: Negative.     Blood pressure 122/60, pulse 82, temperature (!) 97.5 F (36.4 C), temperature source Oral, resp. rate 18, height 4\' 10"  (1.473 m), weight 71.7 kg, SpO2 96 %.Body mass index is 33.02 kg/m.  General Appearance: Casual  Eye Contact:  Good  Speech:  Clear and Coherent  Volume:  Normal  Mood:  Euthymic  Affect:  Constricted  Thought Process:  Goal Directed  Orientation:  Full (Time, Place, and Person)  Thought Content:  Logical, Rumination and Tangential  Suicidal Thoughts:  No  Homicidal Thoughts:  No  Memory:  Immediate;   Fair Recent;   Fair Remote;   Fair  Judgement:  Fair  Insight:  Shallow  Psychomotor Activity:  Normal  Concentration:  Concentration: Fair  Recall:  [employer] of Knowledge:  Fair  Language:  Fair  Akathisia:  No  Handed:  Right  AIMS (if indicated):     Assets:  Desire for Improvement Financial Resources/Insurance Housing  ADL's:  Impaired  Cognition:  Impaired,  Mild  Sleep:  Number of Hours: 7.45     Have you used any form of tobacco in the last 30 days? (Cigarettes, Smokeless Tobacco, Cigars, and/or Pipes): No  Has this patient used any form of tobacco in the last 30 days? (Cigarettes, Smokeless Tobacco, Cigars, and/or Pipes) Yes, No  Blood Alcohol level:  Lab Results  Component Value Date   ETH <10 06/14/2018   ETH <10 16/60/6301    Metabolic Disorder Labs:  Lab Results  Component Value Date   HGBA1C 5.3 06/05/2018   MPG 105.41 06/05/2018   MPG 102.54 12/30/2017   No results found for: PROLACTIN Lab Results  Component Value Date   CHOL 155 06/05/2018   TRIG 93 06/05/2018   HDL 54 06/05/2018   CHOLHDL 2.9 06/05/2018   VLDL 19 06/05/2018   [place] 82 06/05/2018   [employer] 90 12/30/2017    See Psychiatric Specialty Exam and Suicide Risk Assessment completed by Attending Physician prior to discharge.  Discharge destination:  Home  Is patient on multiple antipsychotic therapies  at discharge:  No   Has Patient had three or more failed trials of antipsychotic monotherapy by history:  No  Recommended Plan for Multiple Antipsychotic Therapies: NA  Discharge Instructions    Diet - low sodium heart healthy   Complete by:  As directed    Increase activity slowly   Complete by:  As directed      Allergies as of 06/20/2018      Reactions   Abilify [aripiprazole] Other (See Comments)   Chokes on food   Haldol [haloperidol] Other (See Comments)   Dizziness   Risperdal [risperidone] Other (See Comments)   Leg weakness      Medication List    TAKE these medications     Indication  atorvastatin 10 MG tablet Commonly known as:  LIPITOR Take 1 tablet (10 mg total) by mouth daily.  Indication:  High Amount of Fats in the Blood   carvedilol 6.25 MG tablet Commonly known as:  COREG Take 1 tablet (6.25 mg total) by mouth 2 (two) times daily.  Indication:  High Blood Pressure Disorder, High Blood Pressure of Unknown Cause   DULoxetine 60 MG capsule Commonly known as:  CYMBALTA Take 1 capsule (60 mg total) by mouth daily.  Indication:  Major Depressive Disorder   ferrous sulfate 325 (65 FE) MG tablet Commonly known as:  FeroSul Take 1 tablet (325 mg total) by mouth 2 (two) times daily.  Indication:  Anemia From Inadequate Iron in the Body   gabapentin 600 MG tablet Commonly known as:  NEURONTIN Take 1 tablet (600 mg total) by mouth 3 (three) times daily.  Indication:  Agitation   insulin detemir 100 UNIT/ML injection Commonly known as:  LEVEMIR Inject 0.15 mLs (15 Units total) into the skin 2 (two) times daily.  Indication:  Type 2 Diabetes   lamoTRIgine 100 MG tablet Commonly known as:  LAMICTAL Take 1 tablet (100 mg total) by mouth at bedtime.  Indication:  Manic-Depression   levothyroxine 150 MCG tablet Commonly known as:  SYNTHROID Take 1 tablet (150 mcg total) by mouth daily at 6 (six) AM.  Indication:  Underactive Thyroid   lisinopril 10 MG  tablet Commonly known as:  ZESTRIL Take 1 tablet (10 mg total) by mouth daily.  Indication:  High Blood Pressure Disorder   loratadine 10 MG tablet Commonly known as:  CLARITIN Take 1 tablet (10 mg total) by mouth daily.  Indication:  Perennial Allergic Rhinitis   lurasidone  40 MG Tabs tablet Commonly known as:  LATUDA Take 1 tablet (40 mg total) by mouth 2 (two) times daily with a meal. What changed:    medication strength  how much to take  when to take this  Indication:  Schizophrenia   metFORMIN 500 MG tablet Commonly known as:  GLUCOPHAGE Take 1 tablet (500 mg total) by mouth 2 (two) times daily.  Indication:  Type 2 Diabetes   pantoprazole 40 MG tablet Commonly known as:  PROTONIX Take 1 tablet (40 mg total) by mouth daily.  Indication:  Gastroesophageal Reflux Disease   traZODone 100 MG tablet Commonly known as:  DESYREL Take 1 tablet (100 mg total) by mouth at bedtime.  Indication:  Trouble Sleeping      Follow-up Information    [facility] Follow up on 06/24/2018.   Why:  Please follow up with [facility] on Friday, June 5th at 10:00am. Please take your hospital discharge paperwork with you to your appointment.  Thank you. Contact information: [address] [place] [address] [phone]           Follow-up recommendations:  Activity:  Activity as tolerated Diet:  Carb controlled diet Other:  Continue with outpatient [facility] at [facility].  Continue current medicine.  Work on controlling anger problems  Comments: Patient is agreeable to continuing to try to work on anger problems.  At this point appears to be stable and at her baseline no longer meets commitment criteria and not acutely dangerous.  Signed: Alethia Berthold, MD 06/20/2018, 5:22 PM

## 2018-06-20 NOTE — NC FL2 (Signed)
[place] LEVEL OF CARE SCREENING TOOL     IDENTIFICATION  Patient Name: Jessica [name] Birthdate: 1965/08/09 Sex: female Admission Date (Current Location): 06/15/2018  [place] and [place] Number:  Engineering geologist and Address:  [facility], [address] [place], [place] [address]      Provider Number: [id number]  Attending Physician Name and Address:  Gonzella Lex, MD  Relative Name and Phone Number:  Ollen Bowl (payee) [phone]    Current Level of Care: Hospital Recommended Level of Care: [facility], Other (Comment)(group home) Prior Approval Number:    Date Approved/Denied:   PASRR Number:    Discharge Plan: Other (Comment)(Group home)    Current Diagnoses: Patient Active Problem List   Diagnosis Date Noted  . Homicidal ideation 06/15/2018  . Schizoaffective disorder, bipolar type ([place]) 06/15/2018  . Homicidal thoughts 06/15/2018  . Borderline personality disorder ([place]) 06/05/2018  . Agitation 02/21/2018  . Undifferentiated schizophrenia ([place]) 12/29/2017  . Mild intellectual disability 12/27/2017  . Diabetes mellitus without complication ([place]) 20/35/5974  . Adjustment disorder with mixed disturbance of emotions and conduct 07/21/2017  . Schizophrenia ([place]) 01/02/2017  . Atypical glandular cells of undetermined significance (AGUS) on cervical Pap smear 06/09/2016  . HTN (hypertension) 07/11/2013    Orientation RESPIRATION BLADDER Height & Weight     Self, Time, Situation, Place  Normal Continent Weight: 158 lb (71.7 kg) Height:  4\' 10"  (147.3 cm)  BEHAVIORAL SYMPTOMS/MOOD NEUROLOGICAL BOWEL NUTRITION STATUS  Verbally abusive, Physically abusive (none reported) Continent Diet(normal)  AMBULATORY STATUS COMMUNICATION OF NEEDS Skin   Independent Verbally Normal                       Personal Care Assistance Level of Assistance  (none reported)           Functional Limitations Info  (none  reported)          SPECIAL CARE FACTORS FREQUENCY  (none reported)                    Contractures Contractures Info: Not present    Additional Factors Info  Code Status Code Status Info: full             Current Medications (06/20/2018):  This is the current hospital active medication list Current Facility-Administered Medications  Medication Dose Route Frequency Provider Last Rate Last Dose  . acetaminophen (TYLENOL) tablet 650 mg  650 mg Oral Q6H PRN Lavella Hammock, MD      . alum & mag hydroxide-simeth (MAALOX/MYLANTA) 200-200-20 MG/5ML suspension 30 mL  30 mL Oral Q4H PRN Lavella Hammock, MD      . atorvastatin (LIPITOR) tablet 10 mg  10 mg Oral Daily Lavella Hammock, MD   10 mg at 06/20/18 0803  . carvedilol (COREG) tablet 6.25 mg  6.25 mg Oral BID Lavella Hammock, MD   6.25 mg at 06/20/18 1638  . DULoxetine (CYMBALTA) DR capsule 60 mg  60 mg Oral Daily Lavella Hammock, MD   60 mg at 06/20/18 0803  . ferrous sulfate tablet 325 mg  325 mg Oral BID Lavella Hammock, MD   325 mg at 06/20/18 4536  . gabapentin (NEURONTIN) tablet 600 mg  600 mg Oral TID Lavella Hammock, MD   600 mg at 06/20/18 0803  . insulin detemir (LEVEMIR) injection 15 Units  15 Units Subcutaneous BID Clapacs, Madie Reno, MD   15  Units at 06/19/18 1720  . lamoTRIgine (LAMICTAL) tablet 100 mg  100 mg Oral QHS Lavella Hammock, MD   100 mg at 06/19/18 2128  . levothyroxine (SYNTHROID) tablet 150 mcg  150 mcg Oral Q0600 Lavella Hammock, MD   150 mcg at 06/20/18 0703  . lisinopril (ZESTRIL) tablet 10 mg  10 mg Oral Daily Lavella Hammock, MD   10 mg at 06/20/18 0803  . loratadine (CLARITIN) tablet 10 mg  10 mg Oral Daily Lavella Hammock, MD   10 mg at 06/20/18 0803  . lurasidone (LATUDA) tablet 40 mg  40 mg Oral BID WC Clapacs, Madie Reno, MD   40 mg at 06/20/18 0803  . magnesium hydroxide (MILK OF MAGNESIA) suspension 30 mL  30 mL Oral Daily PRN Lavella Hammock, MD      . metFORMIN (GLUCOPHAGE) tablet  500 mg  500 mg Oral BID Lavella Hammock, MD   500 mg at 06/20/18 4818  . pantoprazole (PROTONIX) EC tablet 40 mg  40 mg Oral Daily Lavella Hammock, MD   40 mg at 06/20/18 0806  . traZODone (DESYREL) tablet 100 mg  100 mg Oral QHS Lavella Hammock, MD   100 mg at 06/19/18 2128     Discharge Medications: Please see discharge summary for a list of discharge medications.  Relevant Imaging Results:  Relevant Lab Results:   Additional [place], LCSW

## 2018-06-27 ENCOUNTER — Encounter: Payer: Self-pay | Admitting: Emergency Medicine

## 2018-06-27 ENCOUNTER — Other Ambulatory Visit: Payer: Self-pay

## 2018-06-27 ENCOUNTER — Emergency Department
Admission: EM | Admit: 2018-06-27 | Discharge: 2018-06-28 | Disposition: A | Discharge status: Home / Self Care | Payer: Medicare Other | Attending: Emergency Medicine | Admitting: Emergency Medicine

## 2018-06-27 DIAGNOSIS — Z20828 Contact with and (suspected) exposure to other viral communicable diseases: Secondary | ICD-10-CM | POA: Diagnosis not present

## 2018-06-27 DIAGNOSIS — R451 Restlessness and agitation: Secondary | ICD-10-CM | POA: Insufficient documentation

## 2018-06-27 DIAGNOSIS — F250 Schizoaffective disorder, bipolar type: Secondary | ICD-10-CM | POA: Insufficient documentation

## 2018-06-27 DIAGNOSIS — Z87891 Personal history of nicotine dependence: Secondary | ICD-10-CM | POA: Insufficient documentation

## 2018-06-27 DIAGNOSIS — I10 Essential (primary) hypertension: Secondary | ICD-10-CM | POA: Insufficient documentation

## 2018-06-27 DIAGNOSIS — Z79899 Other long term (current) drug therapy: Secondary | ICD-10-CM | POA: Insufficient documentation

## 2018-06-27 DIAGNOSIS — F4320 Adjustment disorder, unspecified: Secondary | ICD-10-CM | POA: Insufficient documentation

## 2018-06-27 DIAGNOSIS — Z794 Long term (current) use of insulin: Secondary | ICD-10-CM | POA: Insufficient documentation

## 2018-06-27 DIAGNOSIS — F4325 Adjustment disorder with mixed disturbance of emotions and conduct: Secondary | ICD-10-CM | POA: Diagnosis present

## 2018-06-27 DIAGNOSIS — R4689 Other symptoms and signs involving appearance and behavior: Secondary | ICD-10-CM | POA: Insufficient documentation

## 2018-06-27 DIAGNOSIS — Z046 Encounter for general psychiatric examination, requested by authority: Secondary | ICD-10-CM | POA: Diagnosis present

## 2018-06-27 DIAGNOSIS — E119 Type 2 diabetes mellitus without complications: Secondary | ICD-10-CM | POA: Diagnosis not present

## 2018-06-27 LAB — ETHANOL: Alcohol, Ethyl (B): <10 mg/dL (ref <10)

## 2018-06-27 LAB — COMPREHENSIVE METABOLIC PANEL
ALT: 26 U/L (ref 0–44)
AST: 24 U/L (ref 15–41)
Albumin: 4.3 g/dL (ref 3.5–5.0)
Alkaline Phosphatase: 75 U/L (ref 38–126)
Anion gap: 12 (ref 5–15)
BUN: 18 mg/dL (ref 6–20)
CO2: 28 mmol/L (ref 22–32)
Calcium: 8.3 mg/dL — ABNORMAL LOW (ref 8.9–10.3)
Chloride: 100 mmol/L (ref 98–111)
Creatinine, Ser: 0.61 mg/dL (ref 0.44–1.00)
GFR calc Af Amer: >60 mL/min (ref >60)
GFR calc non Af Amer: >60 mL/min (ref >60)
Glucose, Bld: 103 mg/dL — ABNORMAL HIGH (ref 70–99)
Potassium: 4.2 mmol/L (ref 3.5–5.1)
Sodium: 140 mmol/L (ref 135–145)
Total Bilirubin: 0.6 mg/dL (ref 0.3–1.2)
Total Protein: 7.7 g/dL (ref 6.5–8.1)

## 2018-06-27 LAB — URINE DRUG SCREEN, QUALITATIVE (ARMC ONLY)
Amphetamines, Ur Screen: NOT DETECTED
Barbiturates, Ur Screen: NOT DETECTED
Benzodiazepine, Ur Scrn: NOT DETECTED
Cannabinoid 50 Ng, Ur ~~LOC~~: NOT DETECTED
Cocaine Metabolite,Ur ~~LOC~~: NOT DETECTED
MDMA (Ecstasy)Ur Screen: NOT DETECTED
Methadone Scn, Ur: NOT DETECTED
Opiate, Ur Screen: NOT DETECTED
Phencyclidine (PCP) Ur S: NOT DETECTED
Tricyclic, Ur Screen: NOT DETECTED

## 2018-06-27 LAB — CBC
HCT: 38 % (ref 36.0–46.0)
Hemoglobin: 12.5 g/dL (ref 12.0–15.0)
MCH: 31.3 pg (ref 26.0–34.0)
MCHC: 32.9 g/dL (ref 30.0–36.0)
MCV: 95.2 fL (ref 80.0–100.0)
Platelets: 77 10*3/uL — ABNORMAL LOW (ref 150–400)
RBC: 3.99 MIL/uL (ref 3.87–5.11)
RDW: 12.3 % (ref 11.5–15.5)
WBC: 5.2 10*3/uL (ref 4.0–10.5)
nRBC: 0 % (ref 0.0–0.2)

## 2018-06-27 LAB — SALICYLATE LEVEL: Salicylate Lvl: <7 mg/dL (ref 2.8–30.0)

## 2018-06-27 LAB — ACETAMINOPHEN LEVEL: Acetaminophen (Tylenol), Serum: <10 ug/mL — ABNORMAL LOW (ref 10–30)

## 2018-06-27 NOTE — ED Notes (Signed)
Hourly rounding reveals patient in room. No complaints, stable, in no acute distress. Q15 minute rounds and monitoring via [place] and Officer to continue.   

## 2018-06-27 NOTE — ED Provider Notes (Signed)
[facility] Emergency Department Provider Note  Time seen: 10:33 PM  I have reviewed the triage vital signs and the nursing notes.   HISTORY  Chief Complaint Mental Health Problem   HPI Jessica [name] is a 53 y.o. female with a past medical history of anxiety, depression, diabetes, gastric reflux, hypertension, schizophrenia, presents to the emergency department under IVC for aggressive behavior.  According to the patient she states she does not like living at her group home and became agitated and broke a door.  Per the IVC patient was breaking glass due to agitation/aggression.  Patient denies any medical complaints.  Specifically denies any fever cough congestion or shortness of breath.   Past Medical History:  Diagnosis Date  . Anxiety   . Depression   . Diabetes mellitus without complication ([place])   . GERD (gastroesophageal reflux disease)   . Hypertension     Patient Active Problem List   Diagnosis Date Noted  . Homicidal ideation 06/15/2018  . Schizoaffective disorder, bipolar type ([place]) 06/15/2018  . Homicidal thoughts 06/15/2018  . Borderline personality disorder ([place]) 06/05/2018  . Agitation 02/21/2018  . Undifferentiated schizophrenia ([place]) 12/29/2017  . Mild intellectual disability 12/27/2017  . Diabetes mellitus without complication ([place]) 70/78/6754  . Adjustment disorder with mixed disturbance of emotions and conduct 07/21/2017  . Schizophrenia ([place]) 01/02/2017  . Atypical glandular cells of undetermined significance (AGUS) on cervical Pap smear 06/09/2016  . HTN (hypertension) 07/11/2013    History reviewed. No pertinent surgical history.  Prior to Admission medications   Medication Sig Start Date End Date Taking? Authorizing Provider  atorvastatin (LIPITOR) 10 MG tablet Take 1 tablet (10 mg total) by mouth daily. 06/19/18   Clapacs, Madie Reno, MD  carvedilol (COREG) 6.25 MG tablet Take 1 tablet (6.25 mg total) by mouth 2 (two) times  daily. 06/19/18   Clapacs, Madie Reno, MD  DULoxetine (CYMBALTA) 60 MG capsule Take 1 capsule (60 mg total) by mouth daily. 06/20/18   Clapacs, Madie Reno, MD  ferrous sulfate (FEROSUL) 325 (65 FE) MG tablet Take 1 tablet (325 mg total) by mouth 2 (two) times daily. 06/19/18   Clapacs, Madie Reno, MD  gabapentin (NEURONTIN) 600 MG tablet Take 1 tablet (600 mg total) by mouth 3 (three) times daily. 06/19/18   Clapacs, Madie Reno, MD  insulin detemir (LEVEMIR) 100 UNIT/ML injection Inject 0.15 mLs (15 Units total) into the skin 2 (two) times daily. 06/19/18   Clapacs, Madie Reno, MD  lamoTRIgine (LAMICTAL) 100 MG tablet Take 1 tablet (100 mg total) by mouth at bedtime. 06/19/18   Clapacs, Madie Reno, MD  levothyroxine (SYNTHROID) 150 MCG tablet Take 1 tablet (150 mcg total) by mouth daily at 6 (six) AM. 06/19/18   Clapacs, Madie Reno, MD  lisinopril (ZESTRIL) 10 MG tablet Take 1 tablet (10 mg total) by mouth daily. 06/19/18   Clapacs, Madie Reno, MD  loratadine (CLARITIN) 10 MG tablet Take 1 tablet (10 mg total) by mouth daily. 06/19/18   Clapacs, Madie Reno, MD  lurasidone (LATUDA) 40 MG TABS tablet Take 1 tablet (40 mg total) by mouth 2 (two) times daily with a meal. 06/19/18   Clapacs, Madie Reno, MD  metFORMIN (GLUCOPHAGE) 500 MG tablet Take 1 tablet (500 mg total) by mouth 2 (two) times daily. 06/19/18   Clapacs, Madie Reno, MD  pantoprazole (PROTONIX) 40 MG tablet Take 1 tablet (40 mg total) by mouth daily. 06/19/18   Clapacs, Madie Reno, MD  traZODone (DESYREL) 100 MG  tablet Take 1 tablet (100 mg total) by mouth at bedtime. 06/19/18   Clapacs, Madie Reno, MD    Allergies  Allergen Reactions  . Abilify [Aripiprazole] Other (See Comments)    Chokes on food  . Haldol [Haloperidol] Other (See Comments)    Dizziness  . Risperdal [Risperidone] Other (See Comments)    Leg weakness    No family history on file.  Social History Social History   Tobacco Use  . Smoking status: Former Research scientist (life sciences)  . Smokeless tobacco: Never Used  Substance Use Topics  .  Alcohol use: No    Frequency: Never  . Drug use: Yes    Types: Marijuana, "Crack" cocaine    Comment: reports she has not done anything in a long time    Review of Systems Constitutional: Negative for fever. Cardiovascular: Negative for chest pain. Respiratory: Negative for shortness of breath.  Negative for cough. Gastrointestinal: Negative for abdominal pain Genitourinary: Negative for urinary compaints Musculoskeletal: Negative for musculoskeletal complaints Neurological: Negative for headache All other ROS negative  ____________________________________________   PHYSICAL EXAM:  VITAL SIGNS: ED Triage Vitals  Enc Vitals Group     BP 06/27/18 1922 (!) 142/70     Pulse Rate 06/27/18 1922 74     Resp 06/27/18 1922 18     Temp 06/27/18 1922 97.7 F (36.5 C)     Temp Source 06/27/18 1922 Oral     SpO2 06/27/18 1922 98 %     Weight 06/27/18 1923 158 lb 1.1 oz (71.7 kg)     Height 06/27/18 1923 4\' 10"  (1.473 m)     Head Circumference --      Peak Flow --      Pain Score 06/27/18 1923 0     Pain Loc --      Pain Edu? --      Excl. in [place]? --     Constitutional: Alert. Well appearing and in no distress. Eyes: Normal exam ENT      Head: Normocephalic and atraumatic.      Mouth/Throat: Mucous membranes are moist. Cardiovascular: Normal rate, regular rhythm.  Respiratory: Normal respiratory effort without tachypnea nor retractions. Breath sounds are clear  Gastrointestinal: Soft and nontender. No distention.   Musculoskeletal: Nontender with normal range of motion in all extremities.  Neurologic:  Normal speech and language. No gross focal neurologic deficits  Skin:  Skin is warm, dry and intact.  Psychiatric: Mood and affect are normal.   ____________________________________________  INITIAL IMPRESSION / ASSESSMENT AND PLAN / ED COURSE  Pertinent labs & imaging results that were available during my care of the patient were reviewed by me and considered in my medical  decision making (see chart for details).   Patient presents emergency department under IVC for aggressive behavior at her group facility.  Overall the patient appears well, no acute distress, calm and cooperative.  We will maintain the IVC until psychiatry can evaluate.  Patient has no medical complaints at this time.  Lab work is nonrevealing.  Psychiatric disposition pending.  Jessica [name] was evaluated in Emergency Department on 06/27/2018 for the symptoms described in the history of present illness. She was evaluated in the context of the global COVID-19 pandemic, which necessitated consideration that the patient might be at risk for infection with the SARS-CoV-2 virus that causes COVID-19. Institutional protocols and algorithms that pertain to the evaluation of patients at risk for COVID-19 are in a state of rapid change based on information released by  regulatory bodies including the CDC and federal and state organizations. These policies and algorithms were followed during the patient's care in the ED.  ____________________________________________   FINAL CLINICAL IMPRESSION(S) / ED DIAGNOSES  Aggressive behavior   Harvest Dark, MD 06/27/18 2236

## 2018-06-27 NOTE — ED Notes (Addendum)
With this nurse and EDT Myah present; pt removes grey short-sleeve shirt, jean shorts, white socks, blue slip on shoes, grey panties, and grey bra --all placed in labeled pt belonging bag to be secured on nursing unit and pt changed into paper burgandy scrubs

## 2018-06-27 NOTE — ED Notes (Signed)
Pt. Transferred from Triage to room 22 after dressing out and screening for contraband. Pt. Oriented to Quad including Q15 minute rounds as well as Engineer, drilling for their protection. Patient is alert and oriented, warm and dry in no acute distress. Patient denies SI, HI, and AVH. Pt. Encouraged to let me know if needs arise.

## 2018-06-27 NOTE — ED Triage Notes (Signed)
Patient ambulatory to triage with steady gait, without difficulty or distress noted, in custody of [place] [employer] for IVC; pt denies SI or HI; pt from [place]

## 2018-06-28 DIAGNOSIS — F4325 Adjustment disorder with mixed disturbance of emotions and conduct: Secondary | ICD-10-CM

## 2018-06-28 DIAGNOSIS — R4689 Other symptoms and signs involving appearance and behavior: Secondary | ICD-10-CM | POA: Diagnosis not present

## 2018-06-28 LAB — GLUCOSE, CAPILLARY: Glucose-Capillary: 201 mg/dL — ABNORMAL HIGH (ref 70–99)

## 2018-06-28 MED ORDER — FERROUS SULFATE 325 (65 FE) MG PO TABS
325.0000 mg | ORAL_TABLET | Freq: Two times a day (BID) | ORAL | Status: DC
  Administered 2018-06-28: 12:00:00 325 mg via ORAL
  Filled 2018-06-28 (×2): qty 1

## 2018-06-28 MED ORDER — LURASIDONE HCL 40 MG PO TABS: 40.0000 mg | ORAL_TABLET | Freq: Two times a day (BID) | ORAL | Status: DC

## 2018-06-28 MED ORDER — PANTOPRAZOLE SODIUM 40 MG PO TBEC
40.0000 mg | DELAYED_RELEASE_TABLET | Freq: Every day | ORAL | Status: DC
  Administered 2018-06-28: 11:00:00 40 mg via ORAL
  Filled 2018-06-28: qty 1

## 2018-06-28 MED ORDER — METFORMIN HCL 500 MG PO TABS
500.0000 mg | ORAL_TABLET | Freq: Two times a day (BID) | ORAL | Status: DC
  Filled 2018-06-28: qty 1

## 2018-06-28 MED ORDER — DULOXETINE HCL 60 MG PO CPEP
60.0000 mg | ORAL_CAPSULE | Freq: Every day | ORAL | Status: DC
  Administered 2018-06-28: 60 mg via ORAL
  Filled 2018-06-28: qty 1

## 2018-06-28 MED ORDER — ATORVASTATIN CALCIUM 10 MG PO TABS
10.0000 mg | ORAL_TABLET | Freq: Every day | ORAL | Status: DC
  Administered 2018-06-28: 11:00:00 10 mg via ORAL
  Filled 2018-06-28: qty 1

## 2018-06-28 MED ORDER — CARVEDILOL 6.25 MG PO TABS
6.2500 mg | ORAL_TABLET | Freq: Two times a day (BID) | ORAL | Status: DC
  Administered 2018-06-28: 6.25 mg via ORAL
  Filled 2018-06-28 (×3): qty 1

## 2018-06-28 MED ORDER — INSULIN DETEMIR 100 UNIT/ML ~~LOC~~ SOLN
15.0000 [IU] | Freq: Two times a day (BID) | SUBCUTANEOUS | Status: DC
  Administered 2018-06-28: 12:00:00 15 [IU] via SUBCUTANEOUS
  Filled 2018-06-28 (×2): qty 0.15

## 2018-06-28 MED ORDER — LORATADINE 10 MG PO TABS
10.0000 mg | ORAL_TABLET | Freq: Every day | ORAL | Status: DC
  Administered 2018-06-28: 11:00:00 10 mg via ORAL
  Filled 2018-06-28: qty 1

## 2018-06-28 MED ORDER — LAMOTRIGINE 100 MG PO TABS: 100.0000 mg | ORAL_TABLET | Freq: Every day | ORAL | Status: DC

## 2018-06-28 MED ORDER — TRAZODONE HCL 100 MG PO TABS: 100.0000 mg | ORAL_TABLET | Freq: Every day | ORAL | Status: DC

## 2018-06-28 MED ORDER — LISINOPRIL 5 MG PO TABS
10.0000 mg | ORAL_TABLET | Freq: Every day | ORAL | Status: DC
  Administered 2018-06-28: 10 mg via ORAL
  Filled 2018-06-28: qty 2

## 2018-06-28 MED ORDER — GABAPENTIN 600 MG PO TABS
600.0000 mg | ORAL_TABLET | Freq: Three times a day (TID) | ORAL | Status: DC
  Administered 2018-06-28: 12:00:00 600 mg via ORAL
  Filled 2018-06-28: qty 1

## 2018-06-28 MED ORDER — LURASIDONE HCL 40 MG PO TABS
120.0000 mg | ORAL_TABLET | Freq: Every day | ORAL | Status: DC
  Administered 2018-06-28: 120 mg via ORAL
  Filled 2018-06-28: qty 3

## 2018-06-28 MED ORDER — LEVOTHYROXINE SODIUM 75 MCG PO TABS: 150.0000 ug | ORAL_TABLET | Freq: Every day | ORAL | Status: DC

## 2018-06-28 NOTE — Consult Note (Signed)
[place] [facility] Consult   Reason for Consult: Aggressive behavior Referring Physician: Dr. Kerman Passey Patient Identification: Jessica [name] MRN:  [id number] Principal Diagnosis: Adjustment disorder with mixed disturbance of emotions and conduct Diagnosis:  Principal Problem:   Adjustment disorder with mixed disturbance of emotions and conduct   Total Time spent with patient: 1 hour  Subjective: "I do not feel safe at the group home."  "I am kind of hurt the staff at the group home if I go back there." Jessica [name] is a 53 y.o. female patient presented to [facility] ED via law enforcement under involuntary commitment status (IVC) from [place].  "I hate the staff at my group home and I am going to hurt her.  I wish she could die."  "I do not want to go back over there.  I broke the glass window so I can get the help I need." I do not want you to call my group home or anybody in my family.  I am my own guardian."  The patient was seen face-to-face by this provider; chart reviewed and consulted with Dr. With Dr. Kerman Passey on 06/27/2018 due to the care of the patient. It was discussed with the provider that the patient does meet criteria to be admitted to the inpatient unit. Due to the patient having multiple ED visits with the same complaints/issues and her group home would not accept her back.  She does meet criteria for inpatient psychiatric admission. On evaluation the patient is alert and oriented x3, calm and cooperative, and mood-congruent with affect. "I like when I am here." The patient does not appear to be responding to internal or external stimuli, but admits to experiencing those stimuli's.  Neither is the patient presenting with any delusional thinking. The patient admits auditory hallucinations  The patient denies any suicidal or self-harm ideations, but admits to homicidal ideations. The patient states "I want to hurt the group home staff because she do not like me."  The patient is  not presenting with any psychotic or paranoid behaviors. During an encounter with the patient she was able to answer questions appropriately.   Collateral was not obtained due to no answer when this provider called. Ms. Allene Pyo [phone]   Plan: The patient is not a safety risk to self but could be a risk to others due to her actively threatening the staff at the group home. She intentionaly brook the glass window to seek help. The patient does require psychiatric inpatient admission for stabilization and treatment.   HPI:  Per Dr. Kerman Passey; Jessica [name] is a 53 y.o. female with a past medical history of anxiety, depression, diabetes, gastric reflux, hypertension, schizophrenia, presents to the emergency department under IVC for aggressive behavior.  According to the patient she states she does not like living at her group home and became agitated and broke a door.  Per the IVC patient was breaking glass due to agitation/aggression.  Patient denies any medical complaints.  Specifically denies any fever cough congestion or shortness of breath  Past Psychiatric History:  Anxiety Depression  Risk to Self: Suicidal Ideation: No Suicidal Intent: No Is patient at risk for suicide?: No Suicidal Plan?: No Access to Means: No What has been your use of drugs/alcohol within the last 12 months?: No use in 20 years How many times?: 0 Other Self Harm Risks: denied Triggers for Past Attempts: None known Intentional Self Injurious Behavior: None Risk to Others: Thoughts of Harm to Others: No Current  Homicidal Intent: No Current Homicidal Plan: No Access to Homicidal Means: No Identified Victim: None identified History of harm to others?: No Assessment of Violence: On admission Violent Behavior Description: breaking window and door at group home today Does patient have access to weapons?: No Criminal Charges Pending?: No Does patient have a court date: No Prior Inpatient Therapy:  Prior Inpatient Therapy: Yes Prior Therapy Dates: 12/2017, 12/2016, 01/2007, 10/2006, 07/2005,  Prior Therapy Facilty/Provider(s): [facility] Reason for Treatment: Schizophrenia and depression Prior Outpatient Therapy: Prior Outpatient Therapy: Yes Prior Therapy Dates: Current Prior Therapy Facilty/Provider(s): [facility] Reason for Treatment: Schizophrenia Does patient have an ACCT team?: No Does patient have Intensive In-House Services?  : No Does patient have [name] services? : No Does patient have [name] services?: No  Past Medical History:  Past Medical History:  Diagnosis Date  . Anxiety   . Depression   . Diabetes mellitus without complication ([place])   . GERD (gastroesophageal reflux disease)   . Hypertension    History reviewed. No pertinent surgical history. Family History: No family history on file. Family Psychiatric  History:  Social History:  Social History   Substance and Sexual Activity  Alcohol Use No  . Frequency: Never     Social History   Substance and Sexual Activity  Drug Use Yes  . Types: Marijuana, "Crack" cocaine   Comment: reports she has not done anything in a long time    Social History   Socioeconomic History  . Marital status: Single    Spouse name: Not on file  . Number of children: Not on file  . Years of education: Not on file  . Highest education level: Not on file  Occupational History  . Not on file  Social Needs  . Financial resource strain: Not on file  . Food insecurity:    Worry: Not on file    Inability: Not on file  . Transportation needs:    Medical: Not on file    Non-medical: Not on file  Tobacco Use  . Smoking status: Former Research scientist (life sciences)  . Smokeless tobacco: Never Used  Substance and Sexual Activity  . Alcohol use: No    Frequency: Never  . Drug use: Yes    Types: Marijuana, "Crack" cocaine    Comment: reports she has not done anything in a long time  . Sexual activity: Not Currently     Birth control/protection: Abstinence  Lifestyle  . Physical activity:    Days per week: Not on file    Minutes per session: Not on file  . Stress: Not on file  Relationships  . Social connections:    Talks on phone: Not on file    Gets together: Not on file    Attends religious service: Not on file    Active member of club or organization: Not on file    Attends meetings of clubs or organizations: Not on file    Relationship status: Not on file  Other Topics Concern  . Not on file  Social History Narrative  . Not on file   Additional Social History:    Allergies:   Allergies  Allergen Reactions  . Abilify [Aripiprazole] Other (See Comments)    Chokes on food  . Haldol [Haloperidol] Other (See Comments)    Dizziness  . Risperdal [Risperidone] Other (See Comments)    Leg weakness    Labs:  Results for orders placed or performed during the hospital encounter of 06/27/18 (from  the past 48 hour(s))  Comprehensive metabolic panel     Status: Abnormal   Collection Time: 06/27/18  7:26 PM  Result Value Ref Range   Sodium 140 135 - 145 mmol/L   Potassium 4.2 3.5 - 5.1 mmol/L   Chloride 100 98 - 111 mmol/L   CO2 28 22 - 32 mmol/L   Glucose, Bld 103 (H) 70 - 99 mg/dL   BUN 18 6 - 20 mg/dL   Creatinine, Ser 0.61 0.44 - 1.00 mg/dL   Calcium 8.3 (L) 8.9 - 10.3 mg/dL   Total Protein 7.7 6.5 - 8.1 g/dL   Albumin 4.3 3.5 - 5.0 g/dL   AST 24 15 - 41 U/L   ALT 26 0 - 44 U/L   Alkaline Phosphatase 75 38 - 126 U/L   Total Bilirubin 0.6 0.3 - 1.2 mg/dL   GFR calc non Af Amer >60 >60 mL/min   GFR calc Af Amer >60 >60 mL/min   Anion gap 12 5 - 15    Comment: Performed at [facility], [address], [place], [place] [address]  Ethanol     Status: None   Collection Time: 06/27/18  7:26 PM  Result Value Ref Range   Alcohol, Ethyl (B) <10 <10 mg/dL    Comment: (NOTE) Lowest detectable limit for serum alcohol is 10 mg/dL. For medical purposes only. Performed at [facility], [address] [place], [place] [address]   Salicylate level     Status: None   Collection Time: 06/27/18  7:26 PM  Result Value Ref Range   Salicylate Lvl <6.7 2.8 - 30.0 mg/dL    Comment: Performed at [facility], [address] [place], [place] [address]  Acetaminophen level     Status: Abnormal   Collection Time: 06/27/18  7:26 PM  Result Value Ref Range   Acetaminophen (Tylenol), Serum <10 (L) 10 - 30 ug/mL    Comment: (NOTE) Therapeutic concentrations vary significantly. A range of 10-30 ug/mL  may be an effective concentration for many patients. However, some  are best treated at concentrations outside of this range. Acetaminophen concentrations >150 ug/mL at 4 hours after ingestion  and >50 ug/mL at 12 hours after ingestion are often associated with  toxic reactions. Performed at [facility], [address] [place], [place] [address]   cbc     Status: Abnormal   Collection Time: 06/27/18  7:26 PM  Result Value Ref Range   WBC 5.2 4.0 - 10.5 K/uL   RBC 3.99 3.87 - 5.11 MIL/uL   Hemoglobin 12.5 12.0 - 15.0 g/dL   HCT 38.0 36.0 - 46.0 %   MCV 95.2 80.0 - 100.0 fL   MCH 31.3 26.0 - 34.0 pg   MCHC 32.9 30.0 - 36.0 g/dL   RDW 12.3 11.5 - 15.5 %   Platelets 77 (L) 150 - 400 K/uL    Comment: Immature Platelet Fraction may be clinically indicated, consider ordering this additional test [id number]    nRBC 0.0 0.0 - 0.2 %    Comment: Performed at [facility], [address] [place], [place] [address]  Urine Drug Screen, Qualitative     Status: None   Collection Time: 06/27/18  7:26 PM  Result Value Ref Range   Tricyclic, Ur Screen NONE DETECTED NONE DETECTED   Amphetamines, Ur Screen NONE DETECTED NONE DETECTED   MDMA (Ecstasy)Ur Screen NONE DETECTED NONE DETECTED   Cocaine Metabolite,Ur [place] NONE DETECTED NONE DETECTED  Opiate, Ur Screen NONE DETECTED NONE DETECTED   Phencyclidine (PCP) Ur S NONE DETECTED NONE DETECTED    Cannabinoid 50 Ng, Ur [place] NONE DETECTED NONE DETECTED   Barbiturates, Ur Screen NONE DETECTED NONE DETECTED   Benzodiazepine, Ur Scrn NONE DETECTED NONE DETECTED   Methadone Scn, Ur NONE DETECTED NONE DETECTED    Comment: (NOTE) Tricyclics + metabolites, urine    Cutoff 1000 ng/mL Amphetamines + metabolites, urine  Cutoff 1000 ng/mL MDMA (Ecstasy), urine              Cutoff 500 ng/mL Cocaine Metabolite, urine          Cutoff 300 ng/mL Opiate + metabolites, urine        Cutoff 300 ng/mL Phencyclidine (PCP), urine         Cutoff 25 ng/mL Cannabinoid, urine                 Cutoff 50 ng/mL Barbiturates + metabolites, urine  Cutoff 200 ng/mL Benzodiazepine, urine              Cutoff 200 ng/mL Methadone, urine                   Cutoff 300 ng/mL The urine drug screen provides only a preliminary, unconfirmed analytical test result and should not be used for non-medical purposes. Clinical consideration and professional judgment should be applied to any positive drug screen result due to possible interfering substances. A more specific alternate chemical method must be used in order to obtain a confirmed analytical result. Gas chromatography / mass spectrometry (GC/MS) is the preferred confirmat ory method. Performed at [facility], [address] [place], [place] [address]     No current facility-administered medications for this encounter.    Current Outpatient Medications  Medication Sig Dispense Refill  . atorvastatin (LIPITOR) 10 MG tablet Take 1 tablet (10 mg total) by mouth daily. 30 tablet 1  . carvedilol (COREG) 6.25 MG tablet Take 1 tablet (6.25 mg total) by mouth 2 (two) times daily. 60 tablet 1  . DULoxetine (CYMBALTA) 60 MG capsule Take 1 capsule (60 mg total) by mouth daily. 30 capsule 1  . ferrous sulfate (FEROSUL) 325 (65 FE) MG tablet Take 1 tablet (325 mg total) by mouth 2 (two) times daily. 60 tablet 1  . gabapentin (NEURONTIN) 600 MG tablet Take 1 tablet (600  mg total) by mouth 3 (three) times daily. 90 tablet 1  . insulin detemir (LEVEMIR) 100 UNIT/ML injection Inject 0.15 mLs (15 Units total) into the skin 2 (two) times daily. 10 mL 1  . lamoTRIgine (LAMICTAL) 100 MG tablet Take 1 tablet (100 mg total) by mouth at bedtime. 30 tablet 1  . levothyroxine (SYNTHROID) 150 MCG tablet Take 1 tablet (150 mcg total) by mouth daily at 6 (six) AM. 30 tablet 1  . lisinopril (ZESTRIL) 10 MG tablet Take 1 tablet (10 mg total) by mouth daily. 30 tablet 1  . loratadine (CLARITIN) 10 MG tablet Take 1 tablet (10 mg total) by mouth daily. 30 tablet 1  . lurasidone (LATUDA) 40 MG TABS tablet Take 1 tablet (40 mg total) by mouth 2 (two) times daily with a meal. 60 tablet 1  . metFORMIN (GLUCOPHAGE) 500 MG tablet Take 1 tablet (500 mg total) by mouth 2 (two) times daily. 60 tablet 1  . pantoprazole (PROTONIX) 40 MG tablet Take 1 tablet (40 mg total) by mouth daily. 30 tablet 1  . traZODone (DESYREL) 100 MG tablet  Take 1 tablet (100 mg total) by mouth at bedtime. 30 tablet 1    Musculoskeletal: Strength & Muscle Tone: within normal limits Gait & Station: normal Patient leans: N/A  Psychiatric Specialty Exam: Physical Exam  Vitals reviewed. Constitutional: She is oriented to person, place, and time. She appears well-developed and well-nourished.  HENT:  Head: Normocephalic and atraumatic.  Eyes: Pupils are equal, round, and reactive to light. Conjunctivae are normal.  Neck: Normal range of motion. Neck supple.  Cardiovascular: Normal rate and regular rhythm.  Respiratory: Effort normal and breath sounds normal.  Musculoskeletal: Normal range of motion.  Neurological: She is alert and oriented to person, place, and time.  Skin: Skin is warm and dry.    ROS  Blood pressure (!) 142/70, pulse 74, temperature 97.7 F (36.5 C), temperature source Oral, resp. rate 18, height 4\' 10"  (1.473 m), weight 71.7 kg, SpO2 98 %.Body mass index is 33.04 kg/m.  General  Appearance: Fairly Groomed  Eye Contact:  Minimal  Speech:  Garbled  Volume:  Decreased  Mood:  Depressed  Affect:  Congruent  Thought Process:  Goal Directed  Orientation:  Full (Time, Place, and Person)  Thought Content:  Logical  Suicidal Thoughts:  No  Homicidal Thoughts:  Yes.  with intent/plan  Memory:  Immediate;   Good Recent;   Good  Judgement:  Poor  Insight:  Lacking  Psychomotor Activity:  Normal  Concentration:  Concentration: Fair and Attention Span: Fair  Recall:  Good  Fund of Knowledge:  Good  Language:  Fair  Akathisia:  NA  Handed:  Right  AIMS (if indicated):     Assets:  Housing Intimacy Leisure Time Social Support  ADL's:  Intact  Cognition:  Impaired,  Mild  Sleep:   Good     Treatment Plan Summary: Daily contact with patient to assess and evaluate symptoms and progress in treatment, Medication management and Plan The patient does meet criteria for psychiatric inpatient facility due to her continuous threat of homicidal ideation towards staff at the group home facility.  Disposition: Supportive therapy provided about ongoing stressors. The patient does meet criteria for psychiatric inpatient admission due to her continuous homicidal ideations.  Lamont Dowdy, NP 06/28/2018 4:19 AM

## 2018-06-28 NOTE — ED Notes (Signed)
Pt given lunch tray.

## 2018-06-28 NOTE — ED Provider Notes (Signed)
-----------------------------------------   1:04 PM on 06/28/2018 -----------------------------------------  The patient has been evaluated by Dr. Leverne Humbles and cleared for discharge.  IVC has been rescinded.  The patient is stable for discharge at this time.  Return precautions provided.   Arta Silence, MD 06/28/18 [phone]

## 2018-06-28 NOTE — ED Notes (Signed)
IVC, PENDING CONSULT 

## 2018-06-28 NOTE — ED Notes (Signed)
Referral information for Psychiatric Hospitalization faxed to;    [facility] [phone]),  . [facility] [phone], [phone], [phone] or [phone]),   . [facility] [phone]),   . [place] [phone]),   . [place] [phone] or [phone]),   . [facility] [phone])

## 2018-06-28 NOTE — Discharge Instructions (Addendum)
Follow-up with your regular psychiatrist.  Return to the ER for any new or worsening symptoms that concern you.

## 2018-06-28 NOTE — Consult Note (Signed)
[facility] Face-to-Face Psychiatry Consult follow-up  Reason for Consult: Aggressive behavior Referring Physician: Dr. Kerman Passey Patient Identification: Jessica [name] MRN:  [id number] Principal Diagnosis: Adjustment disorder with mixed disturbance of emotions and conduct Diagnosis:  Principal Problem:   Adjustment disorder with mixed disturbance of emotions and conduct   Total Time spent with patient: 30 minutes  Subjective: "I'm not suicidal or homicidal.  I was just angry."  HPI:  Per Dr. Kerman Passey; Jessica [name] is a 53 y.o. female with a past medical history of anxiety, depression, diabetes, gastric reflux, hypertension, schizophrenia, presents to the emergency department under IVC for aggressive behavior.  According to the patient she states she does not like living at her group home and became agitated and broke a door.  Per the IVC patient was breaking glass due to agitation/aggression.  Patient denies any medical complaints.  Specifically denies any fever cough congestion or shortness of breath.  On initial psychiatric interview by practitioner: Jessica [name] is a 53 y.o. female patient presented to [facility] ED via law enforcement under involuntary commitment status (IVC) from [place].  "I hate the staff at my group home and I am going to hurt her.  I wish she could die."  "I do not want to go back over there.  I broke the glass window so I can get the help I need." I do not want you to call my group home or anybody in my family.  I am my own guardian."  The patient was seen face-to-face by this provider; chart reviewed and consulted with Dr. With Dr. Kerman Passey on 06/27/2018 due to the care of the patient. It was discussed with the provider that the patient does meet criteria to be admitted to the inpatient unit. Due to the patient having multiple ED visits with the same complaints/issues and her group home would not accept her back.  She does meet criteria for inpatient psychiatric  admission. On evaluation the patient is alert and oriented x3, calm and cooperative, and mood-congruent with affect. "I like when I am here." The patient does not appear to be responding to internal or external stimuli, but admits to experiencing those stimuli's.  Neither is the patient presenting with any delusional thinking. The patient admits auditory hallucinations  The patient denies any suicidal or self-harm ideations, but admits to homicidal ideations. The patient states "I want to hurt the group home staff because she do not like me."  The patient is not presenting with any psychotic or paranoid behaviors. During an encounter with the patient she was able to answer questions appropriately.  Collateral was not obtained due to no answer when this provider called. Ms. Allene Pyo [phone]   On reevaluation this morning, patient reports that she had a verbal disagreement with her supervisor which made her angry, she states she walked outside to remove herself from the situation and sat on a bench.  After her supervisor had gone back inside she remained angry and took a chair and hit a window.  Patient denies that she was having any suicidal or homicidal ideation or intent.  She was upset that the group home called the police.  Ultimately, Jamont from [place] came to the group home and escorted her out.  Patient states that she would like to return home.  She states that she is in the process of being appointed a guardian, as she is unable to find an alternate living situation on her own.  She is requesting the help of  social work to find alternative living arrangements.  Patient continues to deny any suicidal or homicidal ideation.  She is not responding to or preoccupied by internal stimuli.  She denies AVH.  Past Psychiatric History:  Anxiety Depression  Risk to Self: Suicidal Ideation: No Suicidal Intent: No Is patient at risk for suicide?: No Suicidal Plan?: No Access to Means: No What  has been your use of drugs/alcohol within the last 12 months?: No use in 20 years How many times?: 0 Other Self Harm Risks: denied Triggers for Past Attempts: None known Intentional Self Injurious Behavior: None Risk to Others: Thoughts of Harm to Others: No Current Homicidal Intent: No Current Homicidal Plan: No Access to Homicidal Means: No Identified Victim: None identified History of harm to others?: No Assessment of Violence: On admission Violent Behavior Description: breaking window and door at group home today Does patient have access to weapons?: No Criminal Charges Pending?: No Does patient have a court date: No Prior Inpatient Therapy: Prior Inpatient Therapy: Yes Prior Therapy Dates: 12/2017, 12/2016, 01/2007, 10/2006, 07/2005,  Prior Therapy Facilty/Provider(s): [facility] Reason for Treatment: Schizophrenia and depression Prior Outpatient Therapy: Prior Outpatient Therapy: Yes Prior Therapy Dates: Current Prior Therapy Facilty/Provider(s): [facility] Reason for Treatment: Schizophrenia Does patient have an ACCT team?: No Does patient have Intensive In-House Services?  : No Does patient have [name] services? : No Does patient have [name] services?: No  Past Medical History:  Past Medical History:  Diagnosis Date  . Anxiety   . Depression   . Diabetes mellitus without complication ([place])   . GERD (gastroesophageal reflux disease)   . Hypertension    History reviewed. No pertinent surgical history. Family History: No family history on file. Family Psychiatric  History:  Social History:  Social History   Substance and Sexual Activity  Alcohol Use No  . Frequency: Never     Social History   Substance and Sexual Activity  Drug Use Yes  . Types: Marijuana, "Crack" cocaine   Comment: reports she has not done anything in a long time    Social History   Socioeconomic History  . Marital status: Single    Spouse name: Not on file  .  Number of children: Not on file  . Years of education: Not on file  . Highest education level: Not on file  Occupational History  . Not on file  Social Needs  . Financial resource strain: Not on file  . Food insecurity:    Worry: Not on file    Inability: Not on file  . Transportation needs:    Medical: Not on file    Non-medical: Not on file  Tobacco Use  . Smoking status: Former Research scientist (life sciences)  . Smokeless tobacco: Never Used  Substance and Sexual Activity  . Alcohol use: No    Frequency: Never  . Drug use: Yes    Types: Marijuana, "Crack" cocaine    Comment: reports she has not done anything in a long time  . Sexual activity: Not Currently    Birth control/protection: Abstinence  Lifestyle  . Physical activity:    Days per week: Not on file    Minutes per session: Not on file  . Stress: Not on file  Relationships  . Social connections:    Talks on phone: Not on file    Gets together: Not on file    Attends religious service: Not on file    Active member of club or organization:  Not on file    Attends meetings of clubs or organizations: Not on file    Relationship status: Not on file  Other Topics Concern  . Not on file  Social History Narrative  . Not on file   Additional Social History:  Lives in group home, has had behavioral issues.  Patient believes she is in the process of having a guardian appointed.  Until that time she is her own guardian and requests assistance for looking for alternative living arrangements.  Allergies:   Allergies  Allergen Reactions  . Abilify [Aripiprazole] Other (See Comments)    Chokes on food  . Haldol [Haloperidol] Other (See Comments)    Dizziness  . Risperdal [Risperidone] Other (See Comments)    Leg weakness    Labs:  Results for orders placed or performed during the hospital encounter of 06/27/18 (from the past 48 hour(s))  Comprehensive metabolic panel     Status: Abnormal   Collection Time: 06/27/18  7:26 PM  Result Value  Ref Range   Sodium 140 135 - 145 mmol/L   Potassium 4.2 3.5 - 5.1 mmol/L   Chloride 100 98 - 111 mmol/L   CO2 28 22 - 32 mmol/L   Glucose, Bld 103 (H) 70 - 99 mg/dL   BUN 18 6 - 20 mg/dL   Creatinine, Ser 0.61 0.44 - 1.00 mg/dL   Calcium 8.3 (L) 8.9 - 10.3 mg/dL   Total Protein 7.7 6.5 - 8.1 g/dL   Albumin 4.3 3.5 - 5.0 g/dL   AST 24 15 - 41 U/L   ALT 26 0 - 44 U/L   Alkaline Phosphatase 75 38 - 126 U/L   Total Bilirubin 0.6 0.3 - 1.2 mg/dL   GFR calc non Af Amer >60 >60 mL/min   GFR calc Af Amer >60 >60 mL/min   Anion gap 12 5 - 15    Comment: Performed at [facility], [address] [place], [place] [address]  Ethanol     Status: None   Collection Time: 06/27/18  7:26 PM  Result Value Ref Range   Alcohol, Ethyl (B) <10 <10 mg/dL    Comment: (NOTE) Lowest detectable limit for serum alcohol is 10 mg/dL. For medical purposes only. Performed at [facility], [address] [place], [place] [address]   Salicylate level     Status: None   Collection Time: 06/27/18  7:26 PM  Result Value Ref Range   Salicylate Lvl <9.5 2.8 - 30.0 mg/dL    Comment: Performed at [facility], [address] [place], [place] [address]  Acetaminophen level     Status: Abnormal   Collection Time: 06/27/18  7:26 PM  Result Value Ref Range   Acetaminophen (Tylenol), Serum <10 (L) 10 - 30 ug/mL    Comment: (NOTE) Therapeutic concentrations vary significantly. A range of 10-30 ug/mL  may be an effective concentration for many patients. However, some  are best treated at concentrations outside of this range. Acetaminophen concentrations >150 ug/mL at 4 hours after ingestion  and >50 ug/mL at 12 hours after ingestion are often associated with  toxic reactions. Performed at [facility], [address] [place], [place] [address]   cbc     Status: Abnormal   Collection Time: 06/27/18  7:26 PM  Result Value Ref Range   WBC 5.2 4.0 - 10.5 K/uL   RBC 3.99  3.87 - 5.11 MIL/uL   Hemoglobin 12.5 12.0 - 15.0 g/dL   HCT  38.0 36.0 - 46.0 %   MCV 95.2 80.0 - 100.0 fL   MCH 31.3 26.0 - 34.0 pg   MCHC 32.9 30.0 - 36.0 g/dL   RDW 12.3 11.5 - 15.5 %   Platelets 77 (L) 150 - 400 K/uL    Comment: Immature Platelet Fraction may be clinically indicated, consider ordering this additional test [id number]    nRBC 0.0 0.0 - 0.2 %    Comment: Performed at [facility], [address] [place], [place] [address]  Urine Drug Screen, Qualitative     Status: None   Collection Time: 06/27/18  7:26 PM  Result Value Ref Range   Tricyclic, Ur Screen NONE DETECTED NONE DETECTED   Amphetamines, Ur Screen NONE DETECTED NONE DETECTED   MDMA (Ecstasy)Ur Screen NONE DETECTED NONE DETECTED   Cocaine Metabolite,Ur [facility] NONE DETECTED NONE DETECTED   Opiate, Ur Screen NONE DETECTED NONE DETECTED   Phencyclidine (PCP) Ur S NONE DETECTED NONE DETECTED   Cannabinoid 50 Ng, Ur [place] NONE DETECTED NONE DETECTED   Barbiturates, Ur Screen NONE DETECTED NONE DETECTED   Benzodiazepine, Ur Scrn NONE DETECTED NONE DETECTED   Methadone Scn, Ur NONE DETECTED NONE DETECTED    Comment: (NOTE) Tricyclics + metabolites, urine    Cutoff 1000 ng/mL Amphetamines + metabolites, urine  Cutoff 1000 ng/mL MDMA (Ecstasy), urine              Cutoff 500 ng/mL Cocaine Metabolite, urine          Cutoff 300 ng/mL Opiate + metabolites, urine        Cutoff 300 ng/mL Phencyclidine (PCP), urine         Cutoff 25 ng/mL Cannabinoid, urine                 Cutoff 50 ng/mL Barbiturates + metabolites, urine  Cutoff 200 ng/mL Benzodiazepine, urine              Cutoff 200 ng/mL Methadone, urine                   Cutoff 300 ng/mL The urine drug screen provides only a preliminary, unconfirmed analytical test result and should not be used for non-medical purposes. Clinical consideration and professional judgment should be applied to any positive drug screen result due to possible interfering substances.  A more specific alternate chemical method must be used in order to obtain a confirmed analytical result. Gas chromatography / mass spectrometry (GC/MS) is the preferred confirmat ory method. Performed at [facility], [address] [place], [place] [address]   Glucose, capillary     Status: Abnormal   Collection Time: 06/28/18 10:11 AM  Result Value Ref Range   Glucose-Capillary 201 (H) 70 - 99 mg/dL   Comment 1 Notify RN    Comment 2 Document in Chart     Current Facility-Administered Medications  Medication Dose Route Frequency Provider Last Rate Last Dose  . atorvastatin (LIPITOR) tablet 10 mg  10 mg Oral Daily Arta Silence, MD   10 mg at 06/28/18 1047  . carvedilol (COREG) tablet 6.25 mg  6.25 mg Oral BID Arta Silence, MD   6.25 mg at 06/28/18 1037  . DULoxetine (CYMBALTA) DR capsule 60 mg  60 mg Oral Daily Arta Silence, MD   60 mg at 06/28/18 1037  . ferrous sulfate tablet 325 mg  325 mg Oral BID WC Arta Silence, MD      . gabapentin (NEURONTIN) tablet 600 mg  600 mg  Oral TID Arta Silence, MD      . insulin detemir (LEVEMIR) injection 15 Units  15 Units Subcutaneous BID Arta Silence, MD      . lamoTRIgine (LAMICTAL) tablet 100 mg  100 mg Oral QHS Arta Silence, MD      . Derrill Memo ON 06/29/2018] levothyroxine (SYNTHROID) tablet 150 mcg  150 mcg Oral Q0600 Arta Silence, MD      . lisinopril (ZESTRIL) tablet 10 mg  10 mg Oral Daily Arta Silence, MD   10 mg at 06/28/18 1037  . loratadine (CLARITIN) tablet 10 mg  10 mg Oral Daily Arta Silence, MD   10 mg at 06/28/18 1037  . [START ON 06/29/2018] lurasidone (LATUDA) tablet 120 mg  120 mg Oral Q breakfast Arta Silence, MD      . metFORMIN (GLUCOPHAGE) tablet 500 mg  500 mg Oral BID WC Arta Silence, MD      . pantoprazole (PROTONIX) EC tablet 40 mg  40 mg Oral Daily Arta Silence, MD   40 mg at 06/28/18 1037  . traZODone (DESYREL) tablet 100  mg  100 mg Oral QHS Arta Silence, MD       Current Outpatient Medications  Medication Sig Dispense Refill  . atorvastatin (LIPITOR) 10 MG tablet Take 1 tablet (10 mg total) by mouth daily. 30 tablet 1  . carvedilol (COREG) 6.25 MG tablet Take 1 tablet (6.25 mg total) by mouth 2 (two) times daily. 60 tablet 1  . DULoxetine (CYMBALTA) 60 MG capsule Take 1 capsule (60 mg total) by mouth daily. 30 capsule 1  . ferrous sulfate (FEROSUL) 325 (65 FE) MG tablet Take 1 tablet (325 mg total) by mouth 2 (two) times daily. 60 tablet 1  . gabapentin (NEURONTIN) 600 MG tablet Take 1 tablet (600 mg total) by mouth 3 (three) times daily. 90 tablet 1  . insulin detemir (LEVEMIR) 100 UNIT/ML injection Inject 0.15 mLs (15 Units total) into the skin 2 (two) times daily. 10 mL 1  . lamoTRIgine (LAMICTAL) 100 MG tablet Take 1 tablet (100 mg total) by mouth at bedtime. 30 tablet 1  . levothyroxine (SYNTHROID) 150 MCG tablet Take 1 tablet (150 mcg total) by mouth daily at 6 (six) AM. 30 tablet 1  . lisinopril (ZESTRIL) 10 MG tablet Take 1 tablet (10 mg total) by mouth daily. 30 tablet 1  . loratadine (CLARITIN) 10 MG tablet Take 1 tablet (10 mg total) by mouth daily. 30 tablet 1  . lurasidone (LATUDA) 40 MG TABS tablet Take 1 tablet (40 mg total) by mouth 2 (two) times daily with a meal. (Patient taking differently: Take 120 mg by mouth daily. ) 60 tablet 1  . metFORMIN (GLUCOPHAGE) 500 MG tablet Take 1 tablet (500 mg total) by mouth 2 (two) times daily. 60 tablet 1  . pantoprazole (PROTONIX) 40 MG tablet Take 1 tablet (40 mg total) by mouth daily. 30 tablet 1  . traZODone (DESYREL) 100 MG tablet Take 1 tablet (100 mg total) by mouth at bedtime. 30 tablet 1    Musculoskeletal: Strength & Muscle Tone: within normal limits Gait & Station: normal Patient leans: N/A  Psychiatric Specialty Exam: Physical Exam  Vitals reviewed. Constitutional: She is oriented to person, place, and time. She appears  well-developed and well-nourished. No distress.  HENT:  Head: Normocephalic and atraumatic.  Eyes: EOM are normal.  Neck: Normal range of motion.  Cardiovascular: Normal rate and regular rhythm.  Respiratory: Effort normal. No respiratory distress.  Musculoskeletal: Normal range of  motion.  Neurological: She is alert and oriented to person, place, and time.    Review of Systems  Psychiatric/Behavioral: Negative for depression, hallucinations, memory loss, substance abuse and suicidal ideas. The patient is nervous/anxious. The patient does not have insomnia.   All other systems reviewed and are negative.   Blood pressure 123/84, pulse 83, temperature 97.6 F (36.4 C), temperature source Oral, resp. rate 20, height 4\' 10"  (1.473 m), weight 71.7 kg, SpO2 100 %.Body mass index is 33.04 kg/m.  General Appearance: Fairly Groomed  Eye Contact:  Good  Speech:  Clear and Coherent  Volume:  Normal  Mood:  Anxious and Dysphoric  Affect:  Congruent and Labile when upset  Thought Process:  Goal Directed and Descriptions of Associations: Tangential  Orientation:  Full (Time, Place, and Person)  Thought Content:  Hallucinations: None and Tangential  Suicidal Thoughts:  No  Homicidal Thoughts:  No  Memory:  Immediate;   Good Recent;   Good  Judgement:  Poor  Insight:  Lacking  Psychomotor Activity:  Normal  Concentration:  Concentration: Fair and Attention Span: Fair  Recall:  Good  Fund of Knowledge:  Good  Language:  Fair  Akathisia:  NA  Handed:  Right  AIMS (if indicated):     Assets:  Communication Skills Desire for Improvement Financial Resources/Insurance Housing Intimacy Leisure Time Resilience Social Support  ADL's:  Intact  Cognition:  WNL  Sleep:   Good    Treatment Plan Summary: Plan Rescind involuntary commitment.  Continue home medications.  Disposition: Patient does not meet criteria for psychiatric inpatient admission. Discussed crisis plan, support from  social network, calling 911, coming to the Emergency Department, and calling Suicide Hotline.  She was able to engage in safety planning including plan to return to nearest emergency room or contact emergency services if she feels unable to maintain her own safety or the safety of others. Patient had no further questions, comments, or concerns.  Discharge into care of group home staff, who agrees to maintain patient safety.   Lavella Hammock, MD 06/28/2018 10:59 AM

## 2018-06-28 NOTE — ED Notes (Signed)
Pt given breakfast tray
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
## 2018-06-28 NOTE — ED Notes (Signed)
PENDING DISCHARGE 

## 2018-06-28 NOTE — ED Notes (Signed)
Called pharmacy tech to reconcile med list.

## 2018-06-29 LAB — NOVEL CORONAVIRUS, NAA (HOSP ORDER, SEND-OUT TO REF LAB; TAT 18-24 HRS): SARS-CoV-2, NAA: NOT DETECTED

## 2018-07-05 ENCOUNTER — Emergency Department
Admission: EM | Admit: 2018-07-05 | Discharge: 2018-07-06 | Disposition: A | Discharge status: Other Acute Inpatient Hospital | Payer: Medicare Other | Attending: Emergency Medicine | Admitting: Emergency Medicine

## 2018-07-05 ENCOUNTER — Encounter: Payer: Self-pay | Admitting: Emergency Medicine

## 2018-07-05 ENCOUNTER — Other Ambulatory Visit: Payer: Self-pay

## 2018-07-05 DIAGNOSIS — Z87891 Personal history of nicotine dependence: Secondary | ICD-10-CM | POA: Diagnosis not present

## 2018-07-05 DIAGNOSIS — E119 Type 2 diabetes mellitus without complications: Secondary | ICD-10-CM | POA: Diagnosis not present

## 2018-07-05 DIAGNOSIS — I10 Essential (primary) hypertension: Secondary | ICD-10-CM | POA: Insufficient documentation

## 2018-07-05 DIAGNOSIS — R45850 Homicidal ideations: Secondary | ICD-10-CM

## 2018-07-05 DIAGNOSIS — F70 Mild intellectual disabilities: Secondary | ICD-10-CM | POA: Diagnosis not present

## 2018-07-05 DIAGNOSIS — Z046 Encounter for general psychiatric examination, requested by authority: Secondary | ICD-10-CM | POA: Diagnosis present

## 2018-07-05 DIAGNOSIS — F603 Borderline personality disorder: Secondary | ICD-10-CM | POA: Diagnosis not present

## 2018-07-05 DIAGNOSIS — Z03818 Encounter for observation for suspected exposure to other biological agents ruled out: Secondary | ICD-10-CM | POA: Diagnosis not present

## 2018-07-05 DIAGNOSIS — F209 Schizophrenia, unspecified: Secondary | ICD-10-CM | POA: Diagnosis not present

## 2018-07-05 DIAGNOSIS — Z794 Long term (current) use of insulin: Secondary | ICD-10-CM | POA: Diagnosis not present

## 2018-07-05 DIAGNOSIS — F329 Major depressive disorder, single episode, unspecified: Secondary | ICD-10-CM | POA: Insufficient documentation

## 2018-07-05 DIAGNOSIS — Z79899 Other long term (current) drug therapy: Secondary | ICD-10-CM | POA: Diagnosis not present

## 2018-07-05 LAB — CBC
HCT: 39.2 % (ref 36.0–46.0)
Hemoglobin: 12.6 g/dL (ref 12.0–15.0)
MCH: 30.6 pg (ref 26.0–34.0)
MCHC: 32.1 g/dL (ref 30.0–36.0)
MCV: 95.1 fL (ref 80.0–100.0)
Platelets: 72 10*3/uL — ABNORMAL LOW (ref 150–400)
RBC: 4.12 MIL/uL (ref 3.87–5.11)
RDW: 12.6 % (ref 11.5–15.5)
WBC: 5.2 10*3/uL (ref 4.0–10.5)
nRBC: 0 % (ref 0.0–0.2)

## 2018-07-05 LAB — URINE DRUG SCREEN, QUALITATIVE (ARMC ONLY)
Amphetamines, Ur Screen: NOT DETECTED
Barbiturates, Ur Screen: NOT DETECTED
Benzodiazepine, Ur Scrn: NOT DETECTED
Cannabinoid 50 Ng, Ur ~~LOC~~: NOT DETECTED
Cocaine Metabolite,Ur ~~LOC~~: NOT DETECTED
MDMA (Ecstasy)Ur Screen: NOT DETECTED
Methadone Scn, Ur: NOT DETECTED
Opiate, Ur Screen: NOT DETECTED
Phencyclidine (PCP) Ur S: NOT DETECTED
Tricyclic, Ur Screen: NOT DETECTED

## 2018-07-05 LAB — COMPREHENSIVE METABOLIC PANEL
ALT: 23 U/L (ref 0–44)
AST: 24 U/L (ref 15–41)
Albumin: 4.2 g/dL (ref 3.5–5.0)
Alkaline Phosphatase: 75 U/L (ref 38–126)
Anion gap: 8 (ref 5–15)
BUN: 14 mg/dL (ref 6–20)
CO2: 32 mmol/L (ref 22–32)
Calcium: 8.6 mg/dL — ABNORMAL LOW (ref 8.9–10.3)
Chloride: 99 mmol/L (ref 98–111)
Creatinine, Ser: 0.7 mg/dL (ref 0.44–1.00)
GFR calc Af Amer: >60 mL/min (ref >60)
GFR calc non Af Amer: >60 mL/min (ref >60)
Glucose, Bld: 101 mg/dL — ABNORMAL HIGH (ref 70–99)
Potassium: 4 mmol/L (ref 3.5–5.1)
Sodium: 139 mmol/L (ref 135–145)
Total Bilirubin: 0.7 mg/dL (ref 0.3–1.2)
Total Protein: 7.8 g/dL (ref 6.5–8.1)

## 2018-07-05 LAB — SALICYLATE LEVEL: Salicylate Lvl: <7 mg/dL (ref 2.8–30.0)

## 2018-07-05 LAB — ETHANOL: Alcohol, Ethyl (B): <10 mg/dL (ref <10)

## 2018-07-05 LAB — ACETAMINOPHEN LEVEL: Acetaminophen (Tylenol), Serum: <10 ug/mL — ABNORMAL LOW (ref 10–30)

## 2018-07-05 LAB — GLUCOSE, CAPILLARY: Glucose-Capillary: 188 mg/dL — ABNORMAL HIGH (ref 70–99)

## 2018-07-05 MED ORDER — FERROUS SULFATE 325 (65 FE) MG PO TABS
325.0000 mg | ORAL_TABLET | Freq: Two times a day (BID) | ORAL | Status: DC
  Administered 2018-07-05 – 2018-07-06 (×2): 325 mg via ORAL
  Filled 2018-07-05 (×4): qty 1

## 2018-07-05 MED ORDER — LISINOPRIL 10 MG PO TABS
10.0000 mg | ORAL_TABLET | Freq: Every day | ORAL | Status: DC
  Administered 2018-07-05 – 2018-07-06 (×2): 10 mg via ORAL
  Filled 2018-07-05: qty 2
  Filled 2018-07-05: qty 1

## 2018-07-05 MED ORDER — PANTOPRAZOLE SODIUM 40 MG PO TBEC
40.0000 mg | DELAYED_RELEASE_TABLET | Freq: Every day | ORAL | Status: DC
  Administered 2018-07-05 – 2018-07-06 (×2): 40 mg via ORAL
  Filled 2018-07-05 (×2): qty 1

## 2018-07-05 MED ORDER — TRAZODONE HCL 100 MG PO TABS
100.0000 mg | ORAL_TABLET | Freq: Every day | ORAL | Status: DC
  Administered 2018-07-05: 100 mg via ORAL
  Filled 2018-07-05: qty 1

## 2018-07-05 MED ORDER — GABAPENTIN 600 MG PO TABS
600.0000 mg | ORAL_TABLET | Freq: Three times a day (TID) | ORAL | Status: DC
  Administered 2018-07-05 – 2018-07-06 (×3): 600 mg via ORAL
  Filled 2018-07-05 (×3): qty 1

## 2018-07-05 MED ORDER — INSULIN DETEMIR 100 UNIT/ML ~~LOC~~ SOLN
15.0000 [IU] | Freq: Two times a day (BID) | SUBCUTANEOUS | Status: DC
  Administered 2018-07-05 – 2018-07-06 (×2): 15 [IU] via SUBCUTANEOUS
  Filled 2018-07-05 (×4): qty 0.15

## 2018-07-05 MED ORDER — LAMOTRIGINE 100 MG PO TABS
100.0000 mg | ORAL_TABLET | Freq: Every day | ORAL | Status: DC
  Administered 2018-07-05: 100 mg via ORAL
  Filled 2018-07-05: qty 1

## 2018-07-05 MED ORDER — LURASIDONE HCL 80 MG PO TABS
120.0000 mg | ORAL_TABLET | Freq: Every day | ORAL | Status: DC
  Administered 2018-07-05 – 2018-07-06 (×2): 120 mg via ORAL
  Filled 2018-07-05 (×2): qty 1

## 2018-07-05 MED ORDER — METFORMIN HCL 500 MG PO TABS
500.0000 mg | ORAL_TABLET | Freq: Two times a day (BID) | ORAL | Status: DC
  Administered 2018-07-05 – 2018-07-06 (×2): 500 mg via ORAL
  Filled 2018-07-05 (×2): qty 1

## 2018-07-05 MED ORDER — LORATADINE 10 MG PO TABS
10.0000 mg | ORAL_TABLET | Freq: Every day | ORAL | Status: DC
  Administered 2018-07-05 – 2018-07-06 (×2): 10 mg via ORAL
  Filled 2018-07-05 (×2): qty 1

## 2018-07-05 MED ORDER — LEVOTHYROXINE SODIUM 50 MCG PO TABS
150.0000 ug | ORAL_TABLET | Freq: Every day | ORAL | Status: DC
  Administered 2018-07-06: 150 ug via ORAL
  Filled 2018-07-05: qty 3

## 2018-07-05 MED ORDER — DULOXETINE HCL 60 MG PO CPEP
60.0000 mg | ORAL_CAPSULE | Freq: Every day | ORAL | Status: DC
  Administered 2018-07-05 – 2018-07-06 (×2): 60 mg via ORAL
  Filled 2018-07-05 (×2): qty 1

## 2018-07-05 MED ORDER — CARVEDILOL 6.25 MG PO TABS
6.2500 mg | ORAL_TABLET | Freq: Two times a day (BID) | ORAL | Status: DC
  Administered 2018-07-05 – 2018-07-06 (×2): 6.25 mg via ORAL
  Filled 2018-07-05 (×2): qty 1

## 2018-07-05 NOTE — ED Notes (Signed)
Per Dr. Cinda [facility] no 1:1 sitter due to patient stating she wants to kill herself because she does not want to be at her group home anymore.
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
## 2018-07-05 NOTE — ED Notes (Signed)
Report to include Situation, Background, Assessment, and Recommendations received from [facility]. Patient alert and oriented, warm and dry, in no acute distress. Patient denies HI, AVH and pain. Patient states she has SI and plans to use a towel to strangle herself. Patient made aware of Q15 minute rounds and Engineer, drilling presence for their safety. Patient instructed to come to me with needs or concerns.

## 2018-07-05 NOTE — ED Notes (Signed)
Hourly rounding reveals patient in room. No complaints, stable, in no acute distress. Q15 minute rounds and monitoring via [place] and Officer to continue.   

## 2018-07-05 NOTE — ED Notes (Signed)
Hourly rounding reveals patient sleeping in room. No complaints, stable, in no acute distress. Q15 minute rounds and monitoring via Rover and Officer to continue.  

## 2018-07-05 NOTE — ED Triage Notes (Signed)
Pt to ER via [facility] under IVC from group home.  Pt states she was not SI earlier today, but now feeling SI.  Pt denies HI, but IVC paperwork states she made threats toward family members of group home workers.

## 2018-07-05 NOTE — ED Provider Notes (Addendum)
[facility] Emergency Department Provider Note   ____________________________________________   First MD Initiated Contact with Patient 07/05/18 1846     (approximate)  I have reviewed the triage vital signs and the nursing notes.   HISTORY  Chief Complaint Psychiatric Evaluation    HPI Jessica [name] is a 53 y.o. female who reports she has been having disagreements with the owner of her group home.  Today she says the group home owner called the police on her falsely.  Patient reports she is suicidal wants to kill herself to get out of the situation she is in.  She does not want to go back to her group home either.  She says she went to [place] and they said they would get cardinal to try and get her different group home.         Past Medical History:  Diagnosis Date  . Anxiety   . Depression   . Diabetes mellitus without complication ([place])   . GERD (gastroesophageal reflux disease)   . Hypertension     Patient Active Problem List   Diagnosis Date Noted  . Homicidal ideation 06/15/2018  . Schizoaffective disorder, bipolar type ([place]) 06/15/2018  . Homicidal thoughts 06/15/2018  . Borderline personality disorder ([place]) 06/05/2018  . Agitation 02/21/2018  . Undifferentiated schizophrenia ([place]) 12/29/2017  . Mild intellectual disability 12/27/2017  . Diabetes mellitus without complication ([place]) 11/15/2534  . Adjustment disorder with mixed disturbance of emotions and conduct 07/21/2017  . Schizophrenia ([place]) 01/02/2017  . Atypical glandular cells of undetermined significance (AGUS) on cervical Pap smear 06/09/2016  . HTN (hypertension) 07/11/2013    History reviewed. No pertinent surgical history.  Prior to Admission medications   Medication Sig Start Date End Date Taking? Authorizing Provider  atorvastatin (LIPITOR) 10 MG tablet Take 1 tablet (10 mg total) by mouth daily. 06/19/18   Clapacs, Madie Reno, MD  carvedilol (COREG) 6.25 MG tablet Take 1  tablet (6.25 mg total) by mouth 2 (two) times daily. 06/19/18   Clapacs, Madie Reno, MD  DULoxetine (CYMBALTA) 60 MG capsule Take 1 capsule (60 mg total) by mouth daily. 06/20/18   Clapacs, Madie Reno, MD  ferrous sulfate (FEROSUL) 325 (65 FE) MG tablet Take 1 tablet (325 mg total) by mouth 2 (two) times daily. 06/19/18   Clapacs, Madie Reno, MD  gabapentin (NEURONTIN) 600 MG tablet Take 1 tablet (600 mg total) by mouth 3 (three) times daily. 06/19/18   Clapacs, Madie Reno, MD  insulin detemir (LEVEMIR) 100 UNIT/ML injection Inject 0.15 mLs (15 Units total) into the skin 2 (two) times daily. 06/19/18   Clapacs, Madie Reno, MD  lamoTRIgine (LAMICTAL) 100 MG tablet Take 1 tablet (100 mg total) by mouth at bedtime. 06/19/18   Clapacs, Madie Reno, MD  levothyroxine (SYNTHROID) 150 MCG tablet Take 1 tablet (150 mcg total) by mouth daily at 6 (six) AM. 06/19/18   Clapacs, Madie Reno, MD  lisinopril (ZESTRIL) 10 MG tablet Take 1 tablet (10 mg total) by mouth daily. 06/19/18   Clapacs, Madie Reno, MD  loratadine (CLARITIN) 10 MG tablet Take 1 tablet (10 mg total) by mouth daily. 06/19/18   Clapacs, Madie Reno, MD  lurasidone (LATUDA) 40 MG TABS tablet Take 1 tablet (40 mg total) by mouth 2 (two) times daily with a meal. Patient taking differently: Take 120 mg by mouth daily.  06/19/18   Clapacs, Madie Reno, MD  metFORMIN (GLUCOPHAGE) 500 MG tablet Take 1 tablet (500 mg total) by mouth  2 (two) times daily. 06/19/18   Clapacs, Madie Reno, MD  pantoprazole (PROTONIX) 40 MG tablet Take 1 tablet (40 mg total) by mouth daily. 06/19/18   Clapacs, Madie Reno, MD  traZODone (DESYREL) 100 MG tablet Take 1 tablet (100 mg total) by mouth at bedtime. 06/19/18   Clapacs, Madie Reno, MD    Allergies Abilify [aripiprazole], Haldol [haloperidol], and Risperdal [risperidone]  History reviewed. No pertinent family history.  Social History Social History   Tobacco Use  . Smoking status: Former Research scientist (life sciences)  . Smokeless tobacco: Never Used  Substance Use Topics  . Alcohol use: No     Frequency: Never  . Drug use: Yes    Types: Marijuana, "Crack" cocaine    Comment: reports she has not done anything in a long time    Review of Systems  Constitutional: No fever/chills Eyes: No visual changes. ENT: No sore throat. Cardiovascular: Denies chest pain. Respiratory: Denies shortness of breath. Gastrointestinal: No abdominal pain.  No nausea, no vomiting.  No diarrhea.  No constipation. Genitourinary: Negative for dysuria. Musculoskeletal: Negative for back pain. Skin: Negative for rash. Neurological: Negative for headaches, focal weakness  ____________________________________________   PHYSICAL EXAM:  VITAL SIGNS: ED Triage Vitals  Enc Vitals Group     BP 07/05/18 1727 (!) 154/98     Pulse Rate 07/05/18 1727 66     Resp 07/05/18 1727 18     Temp 07/05/18 1727 97.7 F (36.5 C)     Temp Source 07/05/18 1727 Oral     SpO2 07/05/18 1727 97 %     Weight 07/05/18 1728 164 lb (74.4 kg)     Height 07/05/18 1728 4\' 10"  (1.473 m)     Head Circumference --      Peak Flow --      Pain Score 07/05/18 1733 0     Pain Loc --      Pain Edu? --      Excl. in [place]? --    Constitutional: Alert and oriented. Well appearing and in no acute distress. Eyes: Conjunctivae are normal.  Head: Atraumatic. Nose: No congestion/rhinnorhea. Mouth/Throat: Mucous membranes are moist.  Oropharynx non-erythematous. Neck: No stridor.   Cardiovascular: Normal rate, regular rhythm. Grossly normal heart sounds.  Good peripheral circulation. Respiratory: Normal respiratory effort.  No retractions. Lungs CTAB. Gastrointestinal: Soft and nontender. No distention. No abdominal bruits. No CVA tenderness. Musculoskeletal: No lower extremity tenderness nor edema.   Neurologic:  Normal speech and language. No gross focal neurologic deficits are appreciated. No gait instability. Skin:  Skin is warm, dry and intact. No rash noted.  ____________________________________________   LABS (all labs  ordered are listed, but only abnormal results are displayed)  Labs Reviewed  COMPREHENSIVE METABOLIC PANEL - Abnormal; Notable for the following components:      Result Value   Glucose, Bld 101 (*)    Calcium 8.6 (*)    All other components within normal limits  ACETAMINOPHEN LEVEL - Abnormal; Notable for the following components:   Acetaminophen (Tylenol), Serum <10 (*)    All other components within normal limits  CBC - Abnormal; Notable for the following components:   Platelets 72 (*)    All other components within normal limits  ETHANOL  SALICYLATE LEVEL  URINE DRUG SCREEN, QUALITATIVE ([name] ONLY)  POC URINE PREG, ED   ____________________________________________  EKG   ____________________________________________  RADIOLOGY  ED MD interpretation:   Official radiology report(s): No results found.  ____________________________________________   PROCEDURES  Procedure(s)  performed (including Critical Care):  Procedures   ____________________________________________   INITIAL IMPRESSION / ASSESSMENT AND PLAN / ED COURSE  Jessica [name] was evaluated in Emergency Department on 07/05/2018 for the symptoms described in the history of present illness. She was evaluated in the context of the global COVID-19 pandemic, which necessitated consideration that the patient might be at risk for infection with the SARS-CoV-2 virus that causes COVID-19. Institutional protocols and algorithms that pertain to the evaluation of patients at risk for COVID-19 are in a state of rapid change based on information released by regulatory bodies including the CDC and federal and state organizations. These policies and algorithms were followed during the patient's care in the ED.              ____________________________________________   FINAL CLINICAL IMPRESSION(S) / ED DIAGNOSES  Final diagnoses:  Schizophrenia, unspecified type [facility])     ED Discharge Orders    None        Note:  This document was prepared using Dragon voice recognition software and may include unintentional dictation errors.    Nena Polio, MD 07/05/18 Sharilyn Sites    Nena Polio, MD 07/05/18 2255

## 2018-07-05 NOTE — BH Assessment (Signed)
Assessment Note  Jessica [name] is an 53 y.o. female.  Jessica [name] arrived to ED by way of law enforcement from [place] under IVC.  She reports that she went to [place] because she was unable to get along with the worker at her group home. She reports that she does not feel safe there.  She states that wants a new group home.  She reports that she has prior diagnosis of borderline personality and schizophrenia she reports that she has not seen or heard anything unusual in "quite a while".  She states that she was diagnosed in the 58's.  She states that she does not like her room mate.  She reports having suicidal ideation with intent to kill herself with a towel, and strangle herself.  She states she feels this way because of what is going on.  She denied wanting to kill or harm others.  I don't want to be around nobody no more, everybody lies.  Her living situation has been a source of stress. She denied the use of alcohol or drugs for 20 years.    IVC paperwork reports, "Feels angry and out of control, feels unsafe, wants to kill group home staff and own family".  TTS contact group home and spoke with [place] Constable [phone]) She reports, "She started this morning.  She knocked on the door and wanted to talk to her. And I said what is it, she stated "I just wanted to tell you I don't like you N. B..".  She stated that she wanted to talk to some one else and she called him a [place] and that she did not want to talk to him either.  She reports that this is an on going process and every 2 weeks.  She continues to escalate.  The police were contacted and she was taken to [place].  She knows what she is doing. She uses her disability to get away with this. She needs to be there longer.  She was scheduled to go [employer] to do Classes and she cannot go now because she did this, She knows this. She knows what she is doing."    Diagnosis: Borderline Personality Disorder, Schizophrenia  Past Medical  History:  Past Medical History:  Diagnosis Date  . Anxiety   . Depression   . Diabetes mellitus without complication ([place])   . GERD (gastroesophageal reflux disease)   . Hypertension     History reviewed. No pertinent surgical history.  Family History: History reviewed. No pertinent family history.  Social History:  reports that she has quit smoking. She has never used smokeless tobacco. She reports current drug use. Drugs: Marijuana and "Crack" cocaine. She reports that she does not drink alcohol.  Additional Social History:  Alcohol / Drug Use History of alcohol / drug use?: No history of alcohol / drug abuse  CIWA: CIWA-Ar BP: 138/81 Pulse Rate: 74 COWS:    Allergies:  Allergies  Allergen Reactions  . Abilify [Aripiprazole] Other (See Comments)    Chokes on food  . Haldol [Haloperidol] Other (See Comments)    Dizziness  . Risperdal [Risperidone] Other (See Comments)    Leg weakness    Home Medications: (Not in a hospital admission)   OB/GYN Status:  No LMP recorded. Patient is postmenopausal.  General Assessment Data Location of Assessment: [facility] ED TTS Assessment: In system Is this a Tele or Face-to-Face Assessment?: Face-to-Face Is this an Initial Assessment or a Re-assessment for this encounter?: Initial  Assessment Patient Accompanied by:: N/A Language Other than English: No Living Arrangements: In Group Home: (Comment: Name of Group Home)([address]) What gender do you identify as?: Female Marital status: Single Pregnancy Status: No Living Arrangements: Group Home([address]) Can pt return to current living arrangement?: Yes Admission Status: Involuntary Petitioner: Other([facility]) Is patient capable of signing voluntary admission?: No Referral Source: Self/Family/Friend Insurance type: Medicare/medicare  Medical Screening Exam ([place]) Medical Exam completed: Yes  Crisis Care Plan Living Arrangements: Group  Home([address]) Legal Guardian: Other:(Self) Name of Psychiatrist: [place] Name of Therapist: [place]  Education Status Is patient currently in school?: No Highest grade of school patient has completed: [employer]  Is the patient employed, unemployed or receiving disability?: Unemployed, Receiving disability income  Risk to self with the past 6 months Suicidal Ideation: Yes-Currently Present Has patient been a risk to self within the past 6 months prior to admission? : No Suicidal Intent: Yes-Currently Present Has patient had any suicidal intent within the past 6 months prior to admission? : No Is patient at risk for suicide?: No Suicidal Plan?: Yes-Currently Present Has patient had any suicidal plan within the past 6 months prior to admission? : Yes Specify Current Suicidal Plan: To strangle herself with a towel Access to Means: Yes Specify Access to Suicidal Means: access to towels in her group home What has been your use of drugs/alcohol within the last 12 months?: no alcohol or drugs for 20 years Previous Attempts/Gestures: No How many times?: 0 Other Self Harm Risks: denied Triggers for Past Attempts: None known Intentional Self Injurious Behavior: None Family Suicide History: No Recent stressful life event(s): Other (Comment)(Problems at her group home, does not like roommate) Persecutory voices/beliefs?: No Depression: Yes Depression Symptoms: Feeling angry/irritable Substance abuse history and/or treatment for substance abuse?: Yes Suicide prevention information given to non-admitted patients: Not applicable  Risk to Others within the past 6 months Homicidal Ideation: No Does patient have any lifetime risk of violence toward others beyond the six months prior to admission? : No Thoughts of Harm to Others: No Current Homicidal Intent: No Current Homicidal Plan: No Access to Homicidal Means: No Identified  Victim: None identified History of harm to others?: No Assessment of Violence: None Noted Violent Behavior Description: denied Does patient have access to weapons?: No Criminal Charges Pending?: No Does patient have a court date: No Is patient on probation?: No  Psychosis Hallucinations: None noted Delusions: None noted  Mental Status Report Appearance/Hygiene: In scrubs Eye Contact: Good Motor Activity: Unremarkable Speech: Logical/coherent Level of Consciousness: Alert Mood: Irritable Affect: Irritable Anxiety Level: None Thought Processes: Coherent Judgement: Partial Orientation: Appropriate for developmental age Obsessive Compulsive Thoughts/Behaviors: None  Cognitive Functioning Concentration: Normal Memory: Recent Intact Is patient IDD: No Insight: Poor Impulse Control: Fair Appetite: Good Have you had any weight changes? : No Change Sleep: No Change Vegetative Symptoms: None  ADLScreening [facility] Assessment Services) Patient's cognitive ability adequate to safely complete daily activities?: Yes Patient able to express need for assistance with ADLs?: Yes Independently performs ADLs?: Yes (appropriate for developmental age)  Prior Inpatient Therapy Prior Inpatient Therapy: Yes Prior Therapy Dates: 12/2017, 12/2016, 01/2007, 10/2006, 07/2005,  Prior Therapy Facilty/Provider(s): [name] Reason for Treatment: Schizophrenia and depression  Prior Outpatient Therapy Prior Outpatient Therapy: Yes Prior Therapy Dates: Current Prior Therapy Facilty/Provider(s): [facility] Reason for Treatment: Schizophrenia Does patient have  an ACCT team?: No Does patient have Intensive In-House Services?  : No Does patient have [name] services? : No Does patient have [name] services?: No  ADL Screening (condition at time of admission) Patient's cognitive ability adequate to safely complete daily activities?: Yes Is the patient deaf or have difficulty  hearing?: No Does the patient have difficulty seeing, even when wearing glasses/contacts?: No Does the patient have difficulty concentrating, remembering, or making decisions?: No Patient able to express need for assistance with ADLs?: Yes Independently performs ADLs?: Yes (appropriate for developmental age) Does the patient have difficulty walking or climbing stairs?: No Weakness of Legs: None Weakness of Arms/Hands: None  Home Assistive Devices/Equipment Home Assistive Devices/Equipment: None          Advance Directives (For Healthcare) Does Patient Have a Medical Advance Directive?: No Would patient like information on creating a medical advance directive?: No - Patient declined          Disposition:  Disposition Initial Assessment Completed for this Encounter: Yes  On Site Evaluation by:   Reviewed with Physician:    Elmer Bales 07/05/2018 9:23 PM

## 2018-07-05 NOTE — ED Notes (Signed)
Gave pt a food tray with soda.

## 2018-07-05 NOTE — ED Notes (Signed)
Snack and beverage given. 

## 2018-07-05 NOTE — ED Notes (Signed)
Pt came in with a grey t shirt, grey bra, bule jeans, striped socks, blue tennis shoes, purple jacket, a bible and 2 bags.

## 2018-07-06 ENCOUNTER — Encounter: Payer: Self-pay | Admitting: Psychiatry

## 2018-07-06 DIAGNOSIS — F200 Paranoid schizophrenia: Secondary | ICD-10-CM

## 2018-07-06 DIAGNOSIS — R45850 Homicidal ideations: Secondary | ICD-10-CM | POA: Diagnosis not present

## 2018-07-06 DIAGNOSIS — F209 Schizophrenia, unspecified: Secondary | ICD-10-CM | POA: Diagnosis not present

## 2018-07-06 LAB — GLUCOSE, CAPILLARY: Glucose-Capillary: 160 mg/dL — ABNORMAL HIGH (ref 70–99)

## 2018-07-06 LAB — SARS CORONAVIRUS 2 BY RT PCR (HOSPITAL ORDER, PERFORMED IN ~~LOC~~ HOSPITAL LAB): SARS Coronavirus 2: NEGATIVE

## 2018-07-06 NOTE — ED Notes (Signed)
Hourly rounding reveals patient sleeping in room. No complaints, stable, in no acute distress. Q15 minute rounds and monitoring via Rover and Officer to continue.  

## 2018-07-06 NOTE — Consult Note (Signed)
[facility] Face-to-Face Psychiatry Consult   Reason for Consult: Aggressive behavior Referring Physician: Dr. Cinda Quest Subjective: "I do not like anyone where I live, I am afraid I might actually hurt somebody."  Patient Identification: Jessica [name] MRN:  [id number] Principal Diagnosis: Schizophrenia [facility]) with homicidal thoughts Patient Active Problem List   Diagnosis Date Noted  . Homicidal ideation 06/15/2018  . Schizoaffective disorder, bipolar type ([place]) 06/15/2018  . Homicidal thoughts 06/15/2018  . Borderline personality disorder ([place]) 06/05/2018  . Agitation 02/21/2018  . Undifferentiated schizophrenia ([place]) 12/29/2017  . Mild intellectual disability 12/27/2017  . Diabetes mellitus without complication ([place]) 67/34/1938  . Adjustment disorder with mixed disturbance of emotions and conduct 07/21/2017  . Schizophrenia ([place]) 01/02/2017  . Atypical glandular cells of undetermined significance (AGUS) on cervical Pap smear 06/09/2016  . HTN (hypertension) 07/11/2013   Patient is seen, chart is reviewed. Total Time spent with patient: 45 minutes   HPI:  Per Dr. Cinda Quest; Jessica [name] a 53 y.o.femalewho reports she has been having disagreements with the owner of her group home. Today she says the group home owner called the police on her falsely. Patient reports she is suicidal wants to kill herself to get out of the situation she is in. She does not want to go back to her group home either. She says she went to [facility] and they said they would get cardinal to try and get her different group home.  From initial psychiatric intake: Jessica [name] is a 53 y.o. female patient presented to [facility] ED with staff  from [place].  "I do not like Francis at my group home. "I still do not like my roommate, but I do not want to hurt her anymore."  "I do not want to go back over there."  " I did not break anything this time." "[name], is lying because she is trying to get me to pay for something I did not  do." I broke the glass window the last time; so I can get the help I need." I do not want you to call anybody in my family.  I am my own guardian." Collateral was obtained by TTS Counselor Ms. Sloane. Ms. Allene Pyo [phone]. TTS contact group home and spoke with [place] Constable [phone]) She reports, "She started this morning.  She knocked on the door and wanted to talk to her. And I said what is it, she stated "I just wanted to tell you I don't like you N. B..".  She stated that she wanted to talk to some one else and she called him a [place] and that she did not want to talk to him either.  She reports that this is an on going process and every 2 weeks.  She continues to escalate.  The police were contacted and she was taken to [place].  She knows what she is doing. She uses her disability to get away with this. She needs to be there longer.  She was scheduled to go [employer] to do Classes and she cannot go now because she did this, She knows this. She knows what she is doing."    Past Psychiatric History: Schizophrenia Anxiety Depression  On reevaluation today, patient considers to present with irritable mood and affect.  She reports that she continues to have anger towards her group home caregivers and her roommate.  She states she can be calm while away from the facility, but once she returns home she begins to have homicidal  ideation again.  Patient has recently been aggressive at the facility and destroyed property.  Prior to presentation to the emergency department, police had to be called in order to de-escalate patient due to her extreme agitated state and making threats.  Patient denies suicidal ideation this morning, however did state suicidal plan and intent last night as below.  Patient is currently denying any auditory and visual hallucinations.  Risk to Self: Suicidal Ideation: Yes-Currently Present Suicidal Intent: Yes-Currently Present Is patient at risk  for suicide?: No Suicidal Plan?: Yes-Currently Present Specify Current Suicidal Plan: To strangle herself with a towel Access to Means: Yes Specify Access to Suicidal Means: access to towels in her group home What has been your use of drugs/alcohol within the last 12 months?: no alcohol or drugs for 20 years How many times?: 0 Other Self Harm Risks: denied Triggers for Past Attempts: None known Intentional Self Injurious Behavior: None Risk to Others: Homicidal Ideation: No Thoughts of Harm to Others: No Current Homicidal Intent: No Current Homicidal Plan: No Access to Homicidal Means: No Identified Victim: None identified History of harm to others?: No Assessment of Violence: None Noted Violent Behavior Description: denied Does patient have access to weapons?: No Criminal Charges Pending?: No Does patient have a court date: No Prior Inpatient Therapy: Prior Inpatient Therapy: Yes Prior Therapy Dates: 12/2017, 12/2016, 01/2007, 10/2006, 07/2005,  Prior Therapy Facilty/Provider(s): [name] Reason for Treatment: Schizophrenia and depression Prior Outpatient Therapy: Prior Outpatient Therapy: Yes Prior Therapy Dates: Current Prior Therapy Facilty/Provider(s): [facility] Reason for Treatment: Schizophrenia Does patient have an ACCT team?: No Does patient have Intensive In-House Services?  : No Does patient have [name] services? : No Does patient have [name] services?: No  Past Medical History:  Past Medical History:  Diagnosis Date  . Anxiety   . Depression   . Diabetes mellitus without complication ([place])   . GERD (gastroesophageal reflux disease)   . Hypertension    History reviewed. No pertinent surgical history. Family History: History reviewed. No pertinent family history. Family Psychiatric  History:  Social History:  Social History   Substance and Sexual Activity  Alcohol Use No  . Frequency: Never     Social History   Substance and  Sexual Activity  Drug Use Yes  . Types: Marijuana, "Crack" cocaine   Comment: reports she has not done anything in a long time    Social History   Socioeconomic History  . Marital status: Single    Spouse name: Not on file  . Number of children: Not on file  . Years of education: Not on file  . Highest education level: Not on file  Occupational History  . Not on file  Social Needs  . Financial resource strain: Not on file  . Food insecurity    Worry: Not on file    Inability: Not on file  . Transportation needs    Medical: Not on file    Non-medical: Not on file  Tobacco Use  . Smoking status: Former Research scientist (life sciences)  . Smokeless tobacco: Never Used  Substance and Sexual Activity  . Alcohol use: No    Frequency: Never  . Drug use: Yes    Types: Marijuana, "Crack" cocaine    Comment: reports she has not done anything in a long time  . Sexual activity: Not Currently    Birth control/protection: Abstinence  Lifestyle  . Physical activity    Days per week: Not on file  Minutes per session: Not on file  . Stress: Not on file  Relationships  . Social Herbalist on phone: Not on file    Gets together: Not on file    Attends religious service: Not on file    Active member of club or organization: Not on file    Attends meetings of clubs or organizations: Not on file    Relationship status: Not on file  Other Topics Concern  . Not on file  Social History Narrative  . Not on file   Additional Social History:  Lives in a group home, where she has been having behavioral issues.  Allergies:   Allergies  Allergen Reactions  . Abilify [Aripiprazole] Other (See Comments)    Chokes on food  . Haldol [Haloperidol] Other (See Comments)    Dizziness  . Risperdal [Risperidone] Other (See Comments)    Leg weakness    Labs:  Results for orders placed or performed during the hospital encounter of 07/05/18 (from the past 48 hour(s))  Urine Drug Screen, Qualitative      Status: None   Collection Time: 07/05/18  5:36 PM  Result Value Ref Range   Tricyclic, Ur Screen NONE DETECTED NONE DETECTED   Amphetamines, Ur Screen NONE DETECTED NONE DETECTED   MDMA (Ecstasy)Ur Screen NONE DETECTED NONE DETECTED   Cocaine Metabolite,Ur [place] NONE DETECTED NONE DETECTED   Opiate, Ur Screen NONE DETECTED NONE DETECTED   Phencyclidine (PCP) Ur S NONE DETECTED NONE DETECTED   Cannabinoid 50 Ng, Ur [place] NONE DETECTED NONE DETECTED   Barbiturates, Ur Screen NONE DETECTED NONE DETECTED   Benzodiazepine, Ur Scrn NONE DETECTED NONE DETECTED   Methadone Scn, Ur NONE DETECTED NONE DETECTED    Comment: (NOTE) Tricyclics + metabolites, urine    Cutoff 1000 ng/mL Amphetamines + metabolites, urine  Cutoff 1000 ng/mL MDMA (Ecstasy), urine              Cutoff 500 ng/mL Cocaine Metabolite, urine          Cutoff 300 ng/mL Opiate + metabolites, urine        Cutoff 300 ng/mL Phencyclidine (PCP), urine         Cutoff 25 ng/mL Cannabinoid, urine                 Cutoff 50 ng/mL Barbiturates + metabolites, urine  Cutoff 200 ng/mL Benzodiazepine, urine              Cutoff 200 ng/mL Methadone, urine                   Cutoff 300 ng/mL The urine drug screen provides only a preliminary, unconfirmed analytical test result and should not be used for non-medical purposes. Clinical consideration and professional judgment should be applied to any positive drug screen result due to possible interfering substances. A more specific alternate chemical method must be used in order to obtain a confirmed analytical result. Gas chromatography / mass spectrometry (GC/MS) is the preferred confirmat ory method. Performed at [facility], [address] [place], [place] [address]   Comprehensive metabolic panel     Status: Abnormal   Collection Time: 07/05/18  5:39 PM  Result Value Ref Range   Sodium 139 135 - 145 mmol/L   Potassium 4.0 3.5 - 5.1 mmol/L   Chloride 99 98 - 111 mmol/L   CO2 32 22  - 32 mmol/L   Glucose, Bld 101 (H) 70 - 99 mg/dL  BUN 14 6 - 20 mg/dL   Creatinine, Ser 0.70 0.44 - 1.00 mg/dL   Calcium 8.6 (L) 8.9 - 10.3 mg/dL   Total Protein 7.8 6.5 - 8.1 g/dL   Albumin 4.2 3.5 - 5.0 g/dL   AST 24 15 - 41 U/L   ALT 23 0 - 44 U/L   Alkaline Phosphatase 75 38 - 126 U/L   Total Bilirubin 0.7 0.3 - 1.2 mg/dL   GFR calc non Af Amer >60 >60 mL/min   GFR calc Af Amer >60 >60 mL/min   Anion gap 8 5 - 15    Comment: Performed at [facility], [address] [place], [place] [address]  Ethanol     Status: None   Collection Time: 07/05/18  5:39 PM  Result Value Ref Range   Alcohol, Ethyl (B) <10 <10 mg/dL    Comment: (NOTE) Lowest detectable limit for serum alcohol is 10 mg/dL. For medical purposes only. Performed at [facility], [address] [place], [place] [address]   Salicylate level     Status: None   Collection Time: 07/05/18  5:39 PM  Result Value Ref Range   Salicylate Lvl <3.3 2.8 - 30.0 mg/dL    Comment: Performed at [facility], [address] [place], [place] [address]  Acetaminophen level     Status: Abnormal   Collection Time: 07/05/18  5:39 PM  Result Value Ref Range   Acetaminophen (Tylenol), Serum <10 (L) 10 - 30 ug/mL    Comment: (NOTE) Therapeutic concentrations vary significantly. A range of 10-30 ug/mL  may be an effective concentration for many patients. However, some  are best treated at concentrations outside of this range. Acetaminophen concentrations >150 ug/mL at 4 hours after ingestion  and >50 ug/mL at 12 hours after ingestion are often associated with  toxic reactions. Performed at [facility], [address] [place], [place] [address]   cbc     Status: Abnormal   Collection Time: 07/05/18  5:39 PM  Result Value Ref Range   WBC 5.2 4.0 - 10.5 K/uL   RBC 4.12 3.87 - 5.11 MIL/uL   Hemoglobin 12.6 12.0 - 15.0 g/dL   HCT 39.2 36.0 - 46.0 %   MCV 95.1 80.0 - 100.0 fL   MCH 30.6  26.0 - 34.0 pg   MCHC 32.1 30.0 - 36.0 g/dL   RDW 12.6 11.5 - 15.5 %   Platelets 72 (L) 150 - 400 K/uL    Comment: Immature Platelet Fraction may be clinically indicated, consider ordering this additional test [id number]    nRBC 0.0 0.0 - 0.2 %    Comment: Performed at [facility], [address] [place], [place] [address]  Glucose, capillary     Status: Abnormal   Collection Time: 07/05/18  9:09 PM  Result Value Ref Range   Glucose-Capillary 188 (H) 70 - 99 mg/dL    Current Facility-Administered Medications  Medication Dose Route Frequency Provider Last Rate Last Dose  . carvedilol (COREG) tablet 6.25 mg  6.25 mg Oral BID Nena Polio, MD   6.25 mg at 07/06/18 1033  . DULoxetine (CYMBALTA) DR capsule 60 mg  60 mg Oral Daily Nena Polio, MD   60 mg at 07/06/18 1033  . ferrous sulfate tablet 325 mg  325 mg Oral BID Nena Polio, MD   325 mg at 07/06/18 1033  . gabapentin (NEURONTIN) tablet 600 mg  600 mg Oral TID Nena Polio,  MD   600 mg at 07/06/18 1032  . insulin detemir (LEVEMIR) injection 15 Units  15 Units Subcutaneous BID Nena Polio, MD   15 Units at 07/06/18 1032  . lamoTRIgine (LAMICTAL) tablet 100 mg  100 mg Oral QHS Nena Polio, MD   100 mg at 07/05/18 2135  . levothyroxine (SYNTHROID) tablet 150 mcg  150 mcg Oral Q0600 Nena Polio, MD   150 mcg at 07/06/18 0602  . lisinopril (ZESTRIL) tablet 10 mg  10 mg Oral Daily Nena Polio, MD   10 mg at 07/06/18 1032  . loratadine (CLARITIN) tablet 10 mg  10 mg Oral Daily Nena Polio, MD   10 mg at 07/06/18 1031  . lurasidone (LATUDA) tablet 120 mg  120 mg Oral Daily Nena Polio, MD   120 mg at 07/06/18 1031  . metFORMIN (GLUCOPHAGE) tablet 500 mg  500 mg Oral BID Nena Polio, MD   500 mg at 07/06/18 1032  . pantoprazole (PROTONIX) EC tablet 40 mg  40 mg Oral Daily Nena Polio, MD   40 mg at 07/06/18 1032  . traZODone (DESYREL) tablet 100 mg  100 mg Oral QHS Nena Polio, MD    100 mg at 07/05/18 2135   Current Outpatient Medications  Medication Sig Dispense Refill  . atorvastatin (LIPITOR) 10 MG tablet Take 1 tablet (10 mg total) by mouth daily. 30 tablet 1  . carvedilol (COREG) 6.25 MG tablet Take 1 tablet (6.25 mg total) by mouth 2 (two) times daily. 60 tablet 1  . DULoxetine (CYMBALTA) 60 MG capsule Take 1 capsule (60 mg total) by mouth daily. 30 capsule 1  . ferrous sulfate (FEROSUL) 325 (65 FE) MG tablet Take 1 tablet (325 mg total) by mouth 2 (two) times daily. 60 tablet 1  . gabapentin (NEURONTIN) 600 MG tablet Take 1 tablet (600 mg total) by mouth 3 (three) times daily. 90 tablet 1  . insulin detemir (LEVEMIR) 100 UNIT/ML injection Inject 0.15 mLs (15 Units total) into the skin 2 (two) times daily. 10 mL 1  . levothyroxine (SYNTHROID) 150 MCG tablet Take 1 tablet (150 mcg total) by mouth daily at 6 (six) AM. 30 tablet 1  . lisinopril (ZESTRIL) 10 MG tablet Take 1 tablet (10 mg total) by mouth daily. 30 tablet 1  . loratadine (CLARITIN) 10 MG tablet Take 1 tablet (10 mg total) by mouth daily. 30 tablet 1  . lurasidone (LATUDA) 40 MG TABS tablet Take 1 tablet (40 mg total) by mouth 2 (two) times daily with a meal. (Patient taking differently: Take 120 mg by mouth daily. ) 60 tablet 1  . metFORMIN (GLUCOPHAGE) 500 MG tablet Take 1 tablet (500 mg total) by mouth 2 (two) times daily. 60 tablet 1  . pantoprazole (PROTONIX) 40 MG tablet Take 1 tablet (40 mg total) by mouth daily. 30 tablet 1  . lamoTRIgine (LAMICTAL) 100 MG tablet Take 1 tablet (100 mg total) by mouth at bedtime. 30 tablet 1  . traZODone (DESYREL) 100 MG tablet Take 1 tablet (100 mg total) by mouth at bedtime. 30 tablet 1    Musculoskeletal: Strength & Muscle Tone: within normal limits Gait & Station: normal Patient leans: N/A  Psychiatric Specialty Exam: Physical Exam  Nursing note and vitals reviewed. Constitutional: She is oriented to person, place, and time. She appears well-developed and  well-nourished. No distress.  HENT:  Head: Normocephalic and atraumatic.  Eyes: EOM are normal.  Neck: Normal range  of motion. Neck supple.  Cardiovascular: Normal rate and regular rhythm.  Respiratory: Effort normal. No respiratory distress.  Musculoskeletal: Normal range of motion.  Neurological: She is alert and oriented to person, place, and time.  Patient expresses homicidal ideation.  Review of Systems  Psychiatric/Behavioral: Positive for depression. Negative for hallucinations, memory loss, substance abuse and suicidal ideas. The patient is nervous/anxious. The patient does not have insomnia.   All other systems reviewed and are negative.   Blood pressure 138/81, pulse 74, temperature 98.1 F (36.7 C), temperature source Oral, resp. rate 18, height 4\' 10"  (1.473 m), weight 74.4 kg, SpO2 99 %.Body mass index is 34.28 kg/m.  General Appearance: Fairly Groomed  Eye Contact:  Minimal  Speech:  Clear and Coherent  Volume:  Normal  Mood:  Depressed, Hopeless and Irritable  Affect:  Congruent  Thought Process:  Goal Directed  Orientation:  Full (Time, Place, and Person)  Thought Content:  Logical  Suicidal Thoughts:  No  Homicidal Thoughts:  Yes.  with intent/plan  Memory:  Immediate;   Good Recent;   Good  Judgement:  Poor  Insight:  Lacking  Psychomotor Activity:  Normal  Concentration:  Concentration: Fair and Attention Span: Fair  Recall:  Good  Fund of Knowledge:  Good  Language:  Fair  Akathisia:  NA  Handed:  Right  AIMS (if indicated):     Assets:  Communication Skills Desire for Improvement Financial Resources/Insurance  ADL's:  Intact  Cognition:  WNL  Sleep:   Good     Treatment Plan Summary: Daily contact with patient to assess and evaluate symptoms and progress in treatment and Plan The patient does meet criteria for psychiatric inpatient facility due to her continuous threat of homicidal ideation towards staff at the group home facility.  Home  medications have been restarted.  Disposition: Supportive therapy provided about ongoing stressors. The patient does meet criteria for psychiatric inpatient admission due to her continuous homicidal ideations.  COVID-19 screening obtained. Patient has been accepted to [facility].  Continue involuntary commitment.   Lavella Hammock, MD 07/06/2018 10:35 AM

## 2018-07-06 NOTE — ED Notes (Signed)
SHERIFF  DEPT  CALLED FOR  TRANSPORT  TO  [facility] 

## 2018-07-06 NOTE — ED Notes (Signed)
Hourly rounding reveals patient in room. No complaints, stable, in no acute distress. Q15 minute rounds and monitoring via [place] and Officer to continue.   

## 2018-07-06 NOTE — Social Work (Signed)
CSW informed that patient is recommended for inpatient psychiatric treatment.   TOC CSW signing off.    [place], [place]

## 2018-07-06 NOTE — BH Assessment (Signed)
Patient has been accepted to [facility], Pending COVID-19 results Patient assigned to [facility] Accepting physician is Dr. Jonelle Sports.  Call report to [phone].  Representative was [employer].   ER Staff is aware of it:  Lattie Haw, ER Secretary  Dr. Ellender Hose, ER MD  Berton Lan, Patient's Nurse   Address: [address] [place], [place] [address]

## 2018-07-06 NOTE — ED Notes (Signed)
This RN reviewed EMTALA

## 2018-07-06 NOTE — ED Notes (Signed)
Meal was given to patient at this time with a warm wash cloth to wash face and hands.

## 2018-07-06 NOTE — BH Assessment (Addendum)
Referral information for Psychiatric Hospitalization faxed to;    [facility] [phone]),    [facility] [phone]--[phone])   [facility] [phone] -[phone]) [phone]   [facility] [phone]),   [place] [phone], [phone], [phone] or [phone]),    [facility] [phone] or [phone])   [facility] [phone]),    [facility] [phone]),    [facility] [phone] or [phone])   [facility] [phone]).   [facility] [phone])

## 2018-07-06 NOTE — ED Provider Notes (Signed)
-----------------------------------------   5:00 AM on 07/06/2018 -----------------------------------------   Blood pressure 138/81, pulse 74, temperature 98.1 F (36.7 C), temperature source Oral, resp. rate 18, height 4\' 10"  (1.473 m), weight 74.4 kg, SpO2 99 %.  The patient is calm and cooperative at this time.  There have been no acute events since the last update.  Awaiting disposition plan from Behavioral Medicine team.    Harvest Dark, MD 07/06/18 0500

## 2018-07-06 NOTE — BH Assessment (Signed)
Per Psych MD (Dr. Leverne Humbles), was instructed patient is recommended for inpatient treatment and refer the patient.

## 2018-07-15 ENCOUNTER — Emergency Department
Admission: EM | Admit: 2018-07-15 | Discharge: 2018-07-17 | Disposition: A | Discharge status: Home / Self Care | Payer: Medicare Other | Attending: Emergency Medicine | Admitting: Emergency Medicine

## 2018-07-15 ENCOUNTER — Other Ambulatory Visit: Payer: Self-pay

## 2018-07-15 DIAGNOSIS — F1410 Cocaine abuse, uncomplicated: Secondary | ICD-10-CM | POA: Insufficient documentation

## 2018-07-15 DIAGNOSIS — Z87891 Personal history of nicotine dependence: Secondary | ICD-10-CM | POA: Insufficient documentation

## 2018-07-15 DIAGNOSIS — F4325 Adjustment disorder with mixed disturbance of emotions and conduct: Secondary | ICD-10-CM

## 2018-07-15 DIAGNOSIS — F603 Borderline personality disorder: Secondary | ICD-10-CM | POA: Diagnosis not present

## 2018-07-15 DIAGNOSIS — Z79899 Other long term (current) drug therapy: Secondary | ICD-10-CM | POA: Diagnosis not present

## 2018-07-15 DIAGNOSIS — F250 Schizoaffective disorder, bipolar type: Secondary | ICD-10-CM | POA: Diagnosis present

## 2018-07-15 DIAGNOSIS — F1210 Cannabis abuse, uncomplicated: Secondary | ICD-10-CM | POA: Diagnosis not present

## 2018-07-15 DIAGNOSIS — I10 Essential (primary) hypertension: Secondary | ICD-10-CM | POA: Diagnosis not present

## 2018-07-15 DIAGNOSIS — Z794 Long term (current) use of insulin: Secondary | ICD-10-CM | POA: Insufficient documentation

## 2018-07-15 DIAGNOSIS — E119 Type 2 diabetes mellitus without complications: Secondary | ICD-10-CM | POA: Insufficient documentation

## 2018-07-15 DIAGNOSIS — Z046 Encounter for general psychiatric examination, requested by authority: Secondary | ICD-10-CM | POA: Diagnosis present

## 2018-07-15 DIAGNOSIS — R4689 Other symptoms and signs involving appearance and behavior: Secondary | ICD-10-CM

## 2018-07-15 LAB — COMPREHENSIVE METABOLIC PANEL
ALT: 27 U/L (ref 0–44)
AST: 27 U/L (ref 15–41)
Albumin: 4.3 g/dL (ref 3.5–5.0)
Alkaline Phosphatase: 75 U/L (ref 38–126)
Anion gap: 11 (ref 5–15)
BUN: 24 mg/dL — ABNORMAL HIGH (ref 6–20)
CO2: 29 mmol/L (ref 22–32)
Calcium: 8.5 mg/dL — ABNORMAL LOW (ref 8.9–10.3)
Chloride: 99 mmol/L (ref 98–111)
Creatinine, Ser: 0.54 mg/dL (ref 0.44–1.00)
GFR calc Af Amer: >60 mL/min (ref >60)
GFR calc non Af Amer: >60 mL/min (ref >60)
Glucose, Bld: 126 mg/dL — ABNORMAL HIGH (ref 70–99)
Potassium: 4.6 mmol/L (ref 3.5–5.1)
Sodium: 139 mmol/L (ref 135–145)
Total Bilirubin: 0.7 mg/dL (ref 0.3–1.2)
Total Protein: 7.8 g/dL (ref 6.5–8.1)

## 2018-07-15 LAB — ACETAMINOPHEN LEVEL: Acetaminophen (Tylenol), Serum: <10 ug/mL — ABNORMAL LOW (ref 10–30)

## 2018-07-15 LAB — ETHANOL: Alcohol, Ethyl (B): <10 mg/dL (ref <10)

## 2018-07-15 LAB — GLUCOSE, CAPILLARY: Glucose-Capillary: 195 mg/dL — ABNORMAL HIGH (ref 70–99)

## 2018-07-15 LAB — CBC
HCT: 38.7 % (ref 36.0–46.0)
Hemoglobin: 12.6 g/dL (ref 12.0–15.0)
MCH: 31 pg (ref 26.0–34.0)
MCHC: 32.6 g/dL (ref 30.0–36.0)
MCV: 95.1 fL (ref 80.0–100.0)
Platelets: 75 10*3/uL — ABNORMAL LOW (ref 150–400)
RBC: 4.07 MIL/uL (ref 3.87–5.11)
RDW: 12.6 % (ref 11.5–15.5)
WBC: 5.2 10*3/uL (ref 4.0–10.5)
nRBC: 0 % (ref 0.0–0.2)

## 2018-07-15 LAB — SALICYLATE LEVEL: Salicylate Lvl: <7 mg/dL (ref 2.8–30.0)

## 2018-07-15 MED ORDER — METFORMIN HCL 500 MG PO TABS
500.0000 mg | ORAL_TABLET | Freq: Two times a day (BID) | ORAL | Status: DC
  Administered 2018-07-15 – 2018-07-17 (×4): 500 mg via ORAL
  Filled 2018-07-15 (×4): qty 1

## 2018-07-15 MED ORDER — FERROUS SULFATE 325 (65 FE) MG PO TABS
325.0000 mg | ORAL_TABLET | Freq: Two times a day (BID) | ORAL | Status: DC
  Administered 2018-07-15 – 2018-07-17 (×4): 325 mg via ORAL
  Filled 2018-07-15 (×6): qty 1

## 2018-07-15 MED ORDER — DULOXETINE HCL 60 MG PO CPEP
60.0000 mg | ORAL_CAPSULE | Freq: Every day | ORAL | Status: DC
  Administered 2018-07-16 – 2018-07-17 (×2): 60 mg via ORAL
  Filled 2018-07-15 (×2): qty 1

## 2018-07-15 MED ORDER — GABAPENTIN 600 MG PO TABS
600.0000 mg | ORAL_TABLET | Freq: Three times a day (TID) | ORAL | Status: DC
  Administered 2018-07-15 – 2018-07-17 (×7): 600 mg via ORAL
  Filled 2018-07-15 (×7): qty 1

## 2018-07-15 MED ORDER — LORATADINE 10 MG PO TABS
10.0000 mg | ORAL_TABLET | Freq: Every day | ORAL | Status: DC
  Administered 2018-07-16 – 2018-07-17 (×2): 10 mg via ORAL
  Filled 2018-07-15 (×2): qty 1

## 2018-07-15 MED ORDER — LAMOTRIGINE 25 MG PO TABS
150.0000 mg | ORAL_TABLET | Freq: Every day | ORAL | Status: DC
  Administered 2018-07-15 – 2018-07-16 (×2): 150 mg via ORAL
  Filled 2018-07-15 (×2): qty 1
  Filled 2018-07-15: qty 2

## 2018-07-15 MED ORDER — LURASIDONE HCL 80 MG PO TABS
120.0000 mg | ORAL_TABLET | Freq: Every day | ORAL | Status: DC
  Administered 2018-07-16 – 2018-07-17 (×2): 120 mg via ORAL
  Filled 2018-07-15 (×2): qty 1

## 2018-07-15 MED ORDER — LAMOTRIGINE 100 MG PO TABS: 100.0000 mg | ORAL_TABLET | Freq: Every day | ORAL | Status: DC

## 2018-07-15 MED ORDER — INSULIN DETEMIR 100 UNIT/ML ~~LOC~~ SOLN
15.0000 [IU] | Freq: Two times a day (BID) | SUBCUTANEOUS | Status: DC
  Administered 2018-07-15 – 2018-07-16 (×2): 15 [IU] via SUBCUTANEOUS
  Filled 2018-07-15 (×4): qty 0.15

## 2018-07-15 MED ORDER — ATORVASTATIN CALCIUM 20 MG PO TABS
10.0000 mg | ORAL_TABLET | Freq: Every day | ORAL | Status: DC
  Administered 2018-07-16 – 2018-07-17 (×2): 10 mg via ORAL
  Filled 2018-07-15 (×2): qty 1

## 2018-07-15 MED ORDER — TRAZODONE HCL 100 MG PO TABS
100.0000 mg | ORAL_TABLET | Freq: Every day | ORAL | Status: DC
  Administered 2018-07-15 – 2018-07-16 (×2): 100 mg via ORAL
  Filled 2018-07-15 (×2): qty 1

## 2018-07-15 MED ORDER — LEVOTHYROXINE SODIUM 50 MCG PO TABS
150.0000 ug | ORAL_TABLET | Freq: Every day | ORAL | Status: DC
  Administered 2018-07-16 – 2018-07-17 (×2): 150 ug via ORAL
  Filled 2018-07-15 (×2): qty 3

## 2018-07-15 MED ORDER — CARVEDILOL 6.25 MG PO TABS
6.2500 mg | ORAL_TABLET | Freq: Two times a day (BID) | ORAL | Status: DC
  Administered 2018-07-15 – 2018-07-17 (×4): 6.25 mg via ORAL
  Filled 2018-07-15 (×5): qty 1

## 2018-07-15 MED ORDER — DIVALPROEX SODIUM 500 MG PO DR TAB
500.0000 mg | DELAYED_RELEASE_TABLET | Freq: Two times a day (BID) | ORAL | Status: DC
  Administered 2018-07-15 – 2018-07-17 (×4): 500 mg via ORAL
  Filled 2018-07-15 (×4): qty 1

## 2018-07-15 MED ORDER — PANTOPRAZOLE SODIUM 40 MG PO TBEC
40.0000 mg | DELAYED_RELEASE_TABLET | Freq: Every day | ORAL | Status: DC
  Administered 2018-07-16 – 2018-07-17 (×2): 40 mg via ORAL
  Filled 2018-07-15 (×2): qty 1

## 2018-07-15 MED ORDER — LISINOPRIL 10 MG PO TABS
10.0000 mg | ORAL_TABLET | Freq: Every day | ORAL | Status: DC
  Administered 2018-07-16 – 2018-07-17 (×2): 10 mg via ORAL
  Filled 2018-07-15 (×2): qty 1

## 2018-07-15 NOTE — ED Notes (Signed)
Pt was attempting to shove her chair up against the door to blockade herself in her room. "I don't want y'all in here." She noted this writer had supplies to test pt's CBG and said, "I'm not going to let you do a sugar check, either." Pt encouraged to allow such, but she continued to refuse. Security officer Moses removed the chair from [employer]. She is now lying in bed.

## 2018-07-15 NOTE — ED Notes (Signed)
Pt came with blue glasses, yellow shirt, jean shorts, gold shoes, white bra, pink socks and grey underwear.

## 2018-07-15 NOTE — ED Notes (Signed)
Pt given sprite to drink. 
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
## 2018-07-15 NOTE — ED Notes (Signed)
BEHAVIORAL HEALTH ROUNDING Patient sleeping: No. Patient alert and oriented: yes Behavior appropriate: Yes.  ; If no, describe:  Nutrition and fluids offered: yes Toileting and hygiene offered: Yes  Sitter present: q15 minute observations and security  monitoring Law enforcement present: Yes  ODS  

## 2018-07-15 NOTE — ED Notes (Signed)
IVC/Consult ordered/pending 

## 2018-07-15 NOTE — ED Notes (Signed)
Pt approached nurses' station, knocked on the door, and said flatly, "I'm going to kill you." This writer verbalized understanding and told pt to return to her room.   About 10 minutes later, she allowed CBG, vitals, and meds. Apologized an agreed not to do anything with her blanket. Said she understood that if she made any harmful gestures with it, she would not be allowed a blanket for the rest of the night for safety. Pt is now eating snack in her room.

## 2018-07-15 NOTE — ED Provider Notes (Signed)
[facility] Emergency Department Provider Note   ____________________________________________   First MD Initiated Contact with Patient 07/15/18 1216     (approximate)  I have reviewed the triage vital signs and the nursing notes.   HISTORY  Chief Complaint Suicidal and Homicidal  EM caveat: Patient refusing to answer questions  HPI Jessica [name] is a 53 y.o. female presents for evaluation of IVC   I saw the patient, she is eating a meal and she tells me I do want to talk right now.  When I asked her any questions she says "I want to talk right now".  She seems somewhat agitated, not outwardly aggressive though.  Patient refusing interview at this time.  Psychiatry also saw her, and she refused interview on their first attempt as well  Per IVC papers patient was recently released from the hospital in [place], she is communicating threats to harm herself and others group home  Past Medical History:  Diagnosis Date  . Anxiety   . Depression   . Diabetes mellitus without complication ([place])   . GERD (gastroesophageal reflux disease)   . Hypertension     Patient Active Problem List   Diagnosis Date Noted  . Homicidal ideation 06/15/2018  . Schizoaffective disorder, bipolar type ([place]) 06/15/2018  . Homicidal thoughts 06/15/2018  . Borderline personality disorder ([place]) 06/05/2018  . Agitation 02/21/2018  . Undifferentiated schizophrenia ([place]) 12/29/2017  . Mild intellectual disability 12/27/2017  . Diabetes mellitus without complication ([place]) 65/68/1275  . Adjustment disorder with mixed disturbance of emotions and conduct 07/21/2017  . Schizophrenia ([place]) 01/02/2017  . Atypical glandular cells of undetermined significance (AGUS) on cervical Pap smear 06/09/2016  . HTN (hypertension) 07/11/2013    History reviewed. No pertinent surgical history.  Prior to Admission medications   Medication Sig Start Date End Date Taking? Authorizing Provider   atorvastatin (LIPITOR) 10 MG tablet Take 1 tablet (10 mg total) by mouth daily. 06/19/18  Yes Clapacs, Madie Reno, MD  carvedilol (COREG) 6.25 MG tablet Take 1 tablet (6.25 mg total) by mouth 2 (two) times daily. 06/19/18  Yes Clapacs, Madie Reno, MD  DULoxetine (CYMBALTA) 60 MG capsule Take 1 capsule (60 mg total) by mouth daily. 06/20/18  Yes Clapacs, Madie Reno, MD  ferrous sulfate (FEROSUL) 325 (65 FE) MG tablet Take 1 tablet (325 mg total) by mouth 2 (two) times daily. 06/19/18  Yes Clapacs, Madie Reno, MD  gabapentin (NEURONTIN) 600 MG tablet Take 1 tablet (600 mg total) by mouth 3 (three) times daily. 06/19/18  Yes Clapacs, Madie Reno, MD  insulin detemir (LEVEMIR) 100 UNIT/ML injection Inject 0.15 mLs (15 Units total) into the skin 2 (two) times daily. 06/19/18  Yes Clapacs, Madie Reno, MD  lamoTRIgine (LAMICTAL) 100 MG tablet Take 1 tablet (100 mg total) by mouth at bedtime. 06/19/18  Yes Clapacs, Madie Reno, MD  levothyroxine (SYNTHROID) 150 MCG tablet Take 1 tablet (150 mcg total) by mouth daily at 6 (six) AM. 06/19/18  Yes Clapacs, Madie Reno, MD  lisinopril (ZESTRIL) 10 MG tablet Take 1 tablet (10 mg total) by mouth daily. 06/19/18  Yes Clapacs, Madie Reno, MD  loratadine (CLARITIN) 10 MG tablet Take 1 tablet (10 mg total) by mouth daily. 06/19/18  Yes Clapacs, Madie Reno, MD  lurasidone (LATUDA) 40 MG TABS tablet Take 1 tablet (40 mg total) by mouth 2 (two) times daily with a meal. Patient taking differently: Take 120 mg by mouth daily.  06/19/18  Yes Clapacs, Madie Reno,  MD  metFORMIN (GLUCOPHAGE) 500 MG tablet Take 1 tablet (500 mg total) by mouth 2 (two) times daily. 06/19/18  Yes Clapacs, Madie Reno, MD  pantoprazole (PROTONIX) 40 MG tablet Take 1 tablet (40 mg total) by mouth daily. 06/19/18  Yes Clapacs, Madie Reno, MD  traZODone (DESYREL) 100 MG tablet Take 1 tablet (100 mg total) by mouth at bedtime. 06/19/18  Yes Clapacs, Madie Reno, MD    Allergies Abilify [aripiprazole], Haldol [haloperidol], and Risperdal [risperidone]  No family history  on file.  Social History Social History   Tobacco Use  . Smoking status: Former Research scientist (life sciences)  . Smokeless tobacco: Never Used  Substance Use Topics  . Alcohol use: No    Frequency: Never  . Drug use: Yes    Types: Marijuana, "Crack" cocaine    Comment: reports she has not done anything in a long time    Review of Systems EM caveat   ____________________________________________   PHYSICAL EXAM:  VITAL SIGNS: ED Triage Vitals  Enc Vitals Group     BP 07/15/18 1129 120/60     Pulse Rate 07/15/18 1129 94     Resp 07/15/18 1129 16     Temp 07/15/18 1129 98 F (36.7 C)     Temp Source 07/15/18 1129 Oral     SpO2 07/15/18 1129 95 %     Weight 07/15/18 1123 165 lb (74.8 kg)     Height 07/15/18 1123 4\' 10"  (1.473 m)     Head Circumference --      Peak Flow --      Pain Score 07/15/18 1123 0     Pain Loc --      Pain Edu? --      Excl. in [place]? --     Constitutional: Alert and oriented.  Patient's mood could be best described as "grumpy" Eyes: Conjunctivae are normal. Head: Atraumatic. Nose: No congestion/rhinnorhea. Mouth/Throat: Mucous membranes are moist. Neck: No stridor.  Cardiovascular: Normal rate, regular rhythm. Grossly normal heart sounds.   Respiratory: Normal respiratory effort.  No retractions. Gastrointestinal: Currently eating a sandwich comfortably in no distress. Neurologic: Her speech is quite clear.  No trouble or difficulty eating her sandwich.  Skin:  Skin is warm, dry and intact. No rash noted. Psychiatric: Mood and affect are somewhat aggressive, not violent but just seems overall rather grumpy or drop in mood.  She refuses to answer questions. ____________________________________________   LABS (all labs ordered are listed, but only abnormal results are displayed)  Labs Reviewed  COMPREHENSIVE METABOLIC PANEL - Abnormal; Notable for the following components:      Result Value   Glucose, Bld 126 (*)    BUN 24 (*)    Calcium 8.5 (*)    All other  components within normal limits  ACETAMINOPHEN LEVEL - Abnormal; Notable for the following components:   Acetaminophen (Tylenol), Serum <10 (*)    All other components within normal limits  CBC - Abnormal; Notable for the following components:   Platelets 75 (*)    All other components within normal limits  ETHANOL  SALICYLATE LEVEL  URINE DRUG SCREEN, QUALITATIVE ([name] ONLY)   ____________________________________________  EKG   ____________________________________________  RADIOLOGY   ____________________________________________   PROCEDURES  Procedure(s) performed: None  Procedures  Critical Care performed: No  ____________________________________________   INITIAL IMPRESSION / ASSESSMENT AND PLAN / ED COURSE  Pertinent labs & imaging results that were available during my care of the patient were reviewed by me and considered in  my medical decision making (see chart for details).   Patient presents for homicidal statements.  Under IVC.  Has a history of same, recently left the hospital.  Reassuring medical clinical examination and lab work.  ----------------------------------------- 3:59 PM on 07/15/2018 -----------------------------------------  Ongoing care assigned to Dr. Jimmye Norman, follow-up recommendations from psychiatry      ____________________________________________   FINAL CLINICAL IMPRESSION(S) / ED DIAGNOSES  Final diagnoses:  Aggressiveness        Note:  This document was prepared using Dragon voice recognition software and may include unintentional dictation errors       Delman Kitten, MD 07/15/18 1600

## 2018-07-15 NOTE — ED Notes (Signed)
Pt walked out into the common area and loosely put a blanket around her neck. "I'm going to kill myself." Spoke with pt about this episode and she walked back to her room and put the blanked on the bed. "I'm not going to take my meds.. I don't care, I'm not going to take my meds." Retrieved blanket from pt. Pt continues to remain without nosebleed, which had resolved before transfer to bhu.

## 2018-07-15 NOTE — ED Notes (Signed)

## 2018-07-15 NOTE — ED Notes (Signed)
Pt agreed to have blood drawn and dress out but refuses to give urine sample - appears more calm at this time

## 2018-07-15 NOTE — ED Notes (Signed)
After receiving report from [employer], pt was transferred from the quad to bhu. Pt spoke to this writer about her frustrations with her group home, including the group home owner. She says she becomes suicidal and homicidal when group home staff won't let her use the phone and they call her family. She says the phone ban is due to her behavior and complains that they don't call the other patients' families. She says she was told she could stay until Monday. "That's too soon, don't you think?" This writer reminded her that discharge is between her and her provider. Pt was given a diet ginger ale and offered the TV remote. She was made aware of camera monitoring in the unit and urged to brings needs/concerns to staff. She contracts for safety while here. Will continue to monitor.

## 2018-07-15 NOTE — ED Notes (Signed)
She has had a nose bleed  - nose clamp

## 2018-07-15 NOTE — ED Notes (Signed)
BEHAVIORAL HEALTH ROUNDING Patient sleeping: Yes.   Patient alert and oriented: eyes closed  Appears asleep Behavior appropriate: Yes.  ; If no, describe:  Nutrition and fluids offered: Yes  Toileting and hygiene offered: sleeping Sitter present: q 15 minute observations and security monitoring Law enforcement present: yes  ODS 

## 2018-07-15 NOTE — ED Notes (Signed)
Pt now says she wants to go home. Minutes after having been told phone time was over, she asked to use the phone and was reminded of the rules. "I don't care. I want to call her and cuss her out." She indicated her desire to go home but said she didn't want to return to the group home. Emotional support provided. Reminded her of the need to provide urine specimen. "I'm not peeing."
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
## 2018-07-15 NOTE — ED Notes (Signed)
Pt received meal tray and drink.

## 2018-07-16 DIAGNOSIS — F250 Schizoaffective disorder, bipolar type: Secondary | ICD-10-CM | POA: Diagnosis not present

## 2018-07-16 LAB — URINE DRUG SCREEN, QUALITATIVE (ARMC ONLY)
Amphetamines, Ur Screen: NOT DETECTED
Barbiturates, Ur Screen: NOT DETECTED
Benzodiazepine, Ur Scrn: NOT DETECTED
Cannabinoid 50 Ng, Ur ~~LOC~~: NOT DETECTED
Cocaine Metabolite,Ur ~~LOC~~: NOT DETECTED
MDMA (Ecstasy)Ur Screen: NOT DETECTED
Methadone Scn, Ur: NOT DETECTED
Opiate, Ur Screen: NOT DETECTED
Phencyclidine (PCP) Ur S: NOT DETECTED
Tricyclic, Ur Screen: NOT DETECTED

## 2018-07-16 LAB — GLUCOSE, CAPILLARY: Glucose-Capillary: 210 mg/dL — ABNORMAL HIGH (ref 70–99)

## 2018-07-16 MED ORDER — INSULIN DETEMIR 100 UNIT/ML ~~LOC~~ SOLN
10.0000 [IU] | Freq: Two times a day (BID) | SUBCUTANEOUS | Status: DC
  Administered 2018-07-16 – 2018-07-17 (×2): 10 [IU] via SUBCUTANEOUS
  Filled 2018-07-16 (×4): qty 0.1
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
## 2018-07-16 NOTE — BH Assessment (Signed)
Referral information for Psychiatric Hospitalization faxed to;   [facility] [phone]),   . [facility] [phone])  . [facility] [phone] -or- [phone])  . [facility] [phone]),  . [facility] [phone], [phone], [phone] or [phone]),   [phone] [facility] [phone] or [phone])  . [facility] [phone]),   . [facility] [phone]),   . [facility] [phone] or [phone])  . [facility] [phone]).  [facility] [phone])

## 2018-07-17 DIAGNOSIS — F250 Schizoaffective disorder, bipolar type: Secondary | ICD-10-CM | POA: Diagnosis not present

## 2018-07-17 LAB — GLUCOSE, CAPILLARY: Glucose-Capillary: 125 mg/dL — ABNORMAL HIGH (ref 70–99)

## 2018-07-17 MED ORDER — DIVALPROEX SODIUM 500 MG PO DR TAB: 500.0000 mg | DELAYED_RELEASE_TABLET | Freq: Two times a day (BID) | ORAL | 1 refills | Status: DC

## 2018-07-17 MED ORDER — INSULIN DETEMIR 100 UNIT/ML ~~LOC~~ SOLN: 10.0000 [IU] | Freq: Two times a day (BID) | SUBCUTANEOUS | 11 refills | Status: DC

## 2018-07-17 MED ORDER — LAMOTRIGINE 150 MG PO TABS: 150.0000 mg | ORAL_TABLET | Freq: Every day | ORAL | 1 refills | Status: DC

## 2018-07-17 MED ORDER — LURASIDONE HCL 40 MG PO TABS: 120.0000 mg | ORAL_TABLET | Freq: Every day | ORAL | 1 refills | Status: DC

## 2018-07-17 NOTE — ED Provider Notes (Signed)
-----------------------------------------   6:24 AM on 07/17/2018 -----------------------------------------   Blood pressure 100/70, pulse 90, temperature 98.2 F (36.8 C), temperature source Oral, resp. rate 18, height 4\' 10"  (1.473 m), weight 74.8 kg, SpO2 99 %.  The patient is sleeping at this time.  There have been no acute events since the last update.  Awaiting disposition plan from Behavioral Medicine team.   Paulette Blanch, MD 07/17/18 [phone]

## 2018-07-17 NOTE — Discharge Instructions (Signed)
Managing Schizoaffective Disorder If you have been diagnosed with schizoaffective disorder (ScAD), you may be relieved to know why you have felt or behaved a certain way. You may also feel overwhelmed about the treatment ahead, how to get the support you need, and how to deal with the condition day-to-day. With care and support, you can learn to manage your symptoms and live with ScAD. ScAD is a chronic, lifelong condition that may occur in cycles. Periods of severe symptoms may be followed by periods of less severe symptoms or improvement. There are steps you can take to help manage ScAD and make your life better. How to manage lifestyle changes Managing stress Stress is your body's reaction to life changes and events, both good and bad. For people with ScAD, stress can cause more severe symptoms to start (can be a trigger), so it is important to learn ways to deal with stress. Your health care provider, therapist, or counselor may suggest techniques such as:  Meditation, muscle relaxation, and breathing exercises.  Music therapy. This can include creating music or listening to music.  Life skills training. This training is focused on work, self-care, money, house management, and social skills. Other things you can do to manage stress include:  Keeping a stress diary. This can help you learn what causes your stress to start and how you can control your response to those triggers.  Exercising. Even a short daily walk can help.  Getting enough sleep.  Making a schedule to manage your time. Knowing what you will do from day to day helps you avoid feeling overwhelmed by tasks and deadlines.  Spending time on hobbies you enjoy that help you relax.  Medicines Your health care provider is likely to prescribe various types of medicine depending on your symptoms. These may include one or more of the following types:  Antipsychotics.  Mood stabilizers.  Antidepressants. Make sure you:  Talk  with your pharmacist or health care provider about all medicines that you take, the possible side effects, and which medicines are safe to take together.  Make it your goal to take part in all treatment decisions (shared decision-making). Ask about possible side effects of medicines that your health care provider recommends, and tell him or her how you feel about having those side effects. It is best if shared decision-making with your health care provider is part of your total treatment plan. Relationships Having the support of your family and friends can play a major role in the success of your treatment. The following steps can help you maintain healthy relationships:  Think about going to couples therapy, family therapy, or family education classes.  Create a written plan for your treatment, and include close family members and friends in the process.  Consider bringing your partner or another family member or friend to the appointments you have with your health care provider. How to recognize changes in your condition If you find that your condition is getting worse, talk to your health care provider right away. Watch for these signs:  Your mood becomes extreme with either emotional highs or the intense lows of depression.  Your speech becomes unclear.  You are disorganized, show the wrong social behaviors, or withdraw from social activities.  You have racing thoughts and have trouble thinking clearly or staying focused.  You hear, see, taste, and believe things that others do not.  You have poor personal hygiene, weight gain or weight loss, or changes in how you are sleeping or eating.  Follow these instructions at home:  Take over-the-counter and prescription medicines only as told by your health care provider. Do not start new medicines or stop taking medicines before you ask your health care provider if it is safe to make those changes.  Avoid caffeine, alcohol, and drugs. They  can affect how your medicine works and can make your symptoms worse.  Eat a healthy diet.  Look for support groups in your area so you can meet other people with your condition. You can learn new methods of managing ScAD by listening to others.  Keep all follow-up visits as told by your health care provider, therapist, or counselor. This is important. Where to find support Talking to others  Reach out to trusted friends or family members, explain your condition, and let them know that you are working with a health care team.  Consider giving educational materials to friends and family.  If you are having trouble telling your friends and family about your condition, keep in mind that honest and open communication can make these conversations easier. Finances Be sure to check with your insurance carrier to find out what treatment options are covered by your plan. You may also be able to find financial assistance through not-for-profit organizations or with local government-based resources. If you are taking medicines, you may be able to get the generic form, which may be less expensive than brand-name medicine. Some makers of prescription medicines also offer help to patients who cannot afford the medicines that they need. Therapy and support groups  Make sure you find a counselor or therapist who is familiar with ScAD. Meet with your counselor or therapist once a week or more often if needed.  Find support programs for people with ScAD. Where to find more information  [employer] on Mental Illness: www.nami.org Contact a health care provider if:  You are not able to take your medicines as prescribed.  Your symptoms get worse. Get help right away if:  You have serious thoughts about hurting yourself or others. If you ever feel like you may hurt yourself or others, or have thoughts about taking your own life, get help right away. You can go to your nearest emergency department or  call:  Your local emergency services (911 in the [place]  A suicide crisis helpline, such as the [place] [facility] at 818-509-4467. This is open 24 hours a day. Summary  Schizoaffective disorder (ScAD) is a chronic, lifelong illness. It is best controlled with continuous treatment that includes medicine and therapy.  Learning ways to manage stress may help your treatment to work better.  Having the support of your family and friends can be a key to making your treatment a success.  If you find that your condition is getting worse, talk to your health care provider right away. This information is not intended to replace advice given to you by your health care provider. Make sure you discuss any questions you have with your health care provider. Document Released: 05/07/2016 Document Revised: 04/29/2018 Document Reviewed: 05/07/2016 Elsevier Patient Education  2020 [employer].   Follow up with outpatient psychiatrist, coordinated by the group home

## 2018-07-17 NOTE — ED Notes (Signed)
Pt given meal tray but did not eat and went back to sleep after being made aware of tray.

## 2018-07-17 NOTE — BH Assessment (Signed)
TTS attempted to reach Jessica [name]/[phone]; however, she was unavailable.  TTS left a HIPPA Compliant voice message requesting a return phone call.
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
## 2018-07-17 NOTE — ED Notes (Signed)
Pt up to use bathroom. Pt states she has a nose bleed.

## 2018-07-17 NOTE — ED Notes (Signed)
Pt liable. Placing numerous demands on staff. Maintained on 15 minute checks.

## 2018-07-17 NOTE — ED Notes (Signed)
Pt continuously asking when she will go home. Pt becoming agitated and rude with staff. Maintained on 15 minute checks.

## 2018-07-17 NOTE — ED Notes (Signed)
Pt discharged back to group home.  VS stable. Pt denies SI/HI. All belongings returned to patient. Patient signed for discharge. Discharge instructions and prescriptions reviewed with group home staff.

## 2018-07-17 NOTE — BH Assessment (Signed)
[facility] telephoned this date. Patient is on wait list for their facility should she continue to need inpatient treatment.

## 2018-07-19 ENCOUNTER — Encounter: Payer: Self-pay | Admitting: Emergency Medicine

## 2018-07-19 ENCOUNTER — Emergency Department
Admission: EM | Admit: 2018-07-19 | Discharge: 2018-07-20 | Disposition: A | Discharge status: Home / Self Care | Payer: Medicare Other | Attending: Emergency Medicine | Admitting: Emergency Medicine

## 2018-07-19 ENCOUNTER — Other Ambulatory Visit: Payer: Self-pay

## 2018-07-19 DIAGNOSIS — Z87891 Personal history of nicotine dependence: Secondary | ICD-10-CM | POA: Insufficient documentation

## 2018-07-19 DIAGNOSIS — F603 Borderline personality disorder: Secondary | ICD-10-CM | POA: Diagnosis present

## 2018-07-19 DIAGNOSIS — F329 Major depressive disorder, single episode, unspecified: Secondary | ICD-10-CM | POA: Insufficient documentation

## 2018-07-19 DIAGNOSIS — R45850 Homicidal ideations: Secondary | ICD-10-CM | POA: Diagnosis not present

## 2018-07-19 DIAGNOSIS — R531 Weakness: Secondary | ICD-10-CM | POA: Diagnosis not present

## 2018-07-19 DIAGNOSIS — F918 Other conduct disorders: Secondary | ICD-10-CM | POA: Diagnosis not present

## 2018-07-19 DIAGNOSIS — I10 Essential (primary) hypertension: Secondary | ICD-10-CM | POA: Insufficient documentation

## 2018-07-19 DIAGNOSIS — R4589 Other symptoms and signs involving emotional state: Secondary | ICD-10-CM

## 2018-07-19 DIAGNOSIS — E119 Type 2 diabetes mellitus without complications: Secondary | ICD-10-CM | POA: Diagnosis not present

## 2018-07-19 DIAGNOSIS — R451 Restlessness and agitation: Secondary | ICD-10-CM | POA: Diagnosis present

## 2018-07-19 DIAGNOSIS — R87619 Unspecified abnormal cytological findings in specimens from cervix uteri: Secondary | ICD-10-CM | POA: Diagnosis present

## 2018-07-19 DIAGNOSIS — Z046 Encounter for general psychiatric examination, requested by authority: Secondary | ICD-10-CM | POA: Diagnosis present

## 2018-07-19 DIAGNOSIS — F250 Schizoaffective disorder, bipolar type: Secondary | ICD-10-CM | POA: Diagnosis not present

## 2018-07-19 DIAGNOSIS — J9811 Atelectasis: Secondary | ICD-10-CM | POA: Diagnosis not present

## 2018-07-19 DIAGNOSIS — Z79899 Other long term (current) drug therapy: Secondary | ICD-10-CM | POA: Insufficient documentation

## 2018-07-19 DIAGNOSIS — Z794 Long term (current) use of insulin: Secondary | ICD-10-CM | POA: Insufficient documentation

## 2018-07-19 DIAGNOSIS — Z20828 Contact with and (suspected) exposure to other viral communicable diseases: Secondary | ICD-10-CM | POA: Diagnosis not present

## 2018-07-19 HISTORY — DX: Homicidal ideations: R45.850

## 2018-07-19 LAB — COMPREHENSIVE METABOLIC PANEL
ALT: 22 U/L (ref 0–44)
AST: 22 U/L (ref 15–41)
Albumin: 4.1 g/dL (ref 3.5–5.0)
Alkaline Phosphatase: 70 U/L (ref 38–126)
Anion gap: 10 (ref 5–15)
BUN: 15 mg/dL (ref 6–20)
CO2: 31 mmol/L (ref 22–32)
Calcium: 8.5 mg/dL — ABNORMAL LOW (ref 8.9–10.3)
Chloride: 95 mmol/L — ABNORMAL LOW (ref 98–111)
Creatinine, Ser: 0.82 mg/dL (ref 0.44–1.00)
GFR calc Af Amer: >60 mL/min (ref >60)
GFR calc non Af Amer: >60 mL/min (ref >60)
Glucose, Bld: 148 mg/dL — ABNORMAL HIGH (ref 70–99)
Potassium: 4.2 mmol/L (ref 3.5–5.1)
Sodium: 136 mmol/L (ref 135–145)
Total Bilirubin: 0.6 mg/dL (ref 0.3–1.2)
Total Protein: 7.4 g/dL (ref 6.5–8.1)

## 2018-07-19 LAB — CBC
HCT: 34.8 % — ABNORMAL LOW (ref 36.0–46.0)
Hemoglobin: 11.5 g/dL — ABNORMAL LOW (ref 12.0–15.0)
MCH: 31.2 pg (ref 26.0–34.0)
MCHC: 33 g/dL (ref 30.0–36.0)
MCV: 94.3 fL (ref 80.0–100.0)
Platelets: 74 10*3/uL — ABNORMAL LOW (ref 150–400)
RBC: 3.69 MIL/uL — ABNORMAL LOW (ref 3.87–5.11)
RDW: 12.9 % (ref 11.5–15.5)
WBC: 4.5 10*3/uL (ref 4.0–10.5)
nRBC: 0 % (ref 0.0–0.2)

## 2018-07-19 LAB — SALICYLATE LEVEL: Salicylate Lvl: <7 mg/dL (ref 2.8–30.0)

## 2018-07-19 LAB — ACETAMINOPHEN LEVEL: Acetaminophen (Tylenol), Serum: <10 ug/mL — ABNORMAL LOW (ref 10–30)

## 2018-07-19 LAB — ETHANOL: Alcohol, Ethyl (B): <10 mg/dL (ref <10)
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
## 2018-07-19 NOTE — ED Provider Notes (Signed)
-----------------------------------------   11:42 PM on 07/19/2018 -----------------------------------------   Blood pressure (!) 112/55, pulse 80, temperature 98.7 F (37.1 C), temperature source Oral, resp. rate 18, SpO2 95 %.  The patient is calm and cooperative at this time.  Evaluated by psychiatry Kennyth Lose) who plans to refer the patient to outside facilities.  Remains under IVC at this time.   Hinda Kehr, MD 07/19/18 2342

## 2018-07-19 NOTE — ED Notes (Signed)
Pt eating
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
## 2018-07-19 NOTE — ED Notes (Signed)
TTS unable to assess at this time, as the patient is in the waiting room and has not yet received a bed assignment.

## 2018-07-19 NOTE — ED Provider Notes (Signed)
[facility] Emergency Department Provider Note   ____________________________________________   I have reviewed the triage vital signs and the nursing notes.   HISTORY  Chief Complaint Homicidal   History limited by: Not Limited   HPI Jessica [name] is a 53 y.o. female who presents to the emergency department today under IVC because of concerns for homicidal ideation.  Patient is coming from a group home.  She states that she wants to kill everybody group home.  She says she has felt this way for a long time.  She expresses that she is very unhappy there.  She states she would like to go back to aspirin.  Patient states that she continues to want to hurt people at her group home.  Patient denies any medical complaints.  Records reviewed. Per medical record review patient has a history of frequent emergency department visits for similar complaints Past Medical History:  Diagnosis Date  . Anxiety   . Depression   . Diabetes mellitus without complication ([place])   . GERD (gastroesophageal reflux disease)   . Homicidal ideation   . Hypertension     Patient Active Problem List   Diagnosis Date Noted  . Homicidal ideation 06/15/2018  . Schizoaffective disorder, bipolar type ([place]) 06/15/2018  . Homicidal thoughts 06/15/2018  . Borderline personality disorder ([place]) 06/05/2018  . Agitation 02/21/2018  . Undifferentiated schizophrenia ([place]) 12/29/2017  . Mild intellectual disability 12/27/2017  . Diabetes mellitus without complication ([place]) 43/15/4008  . Atypical glandular cells of undetermined significance (AGUS) on cervical Pap smear 06/09/2016  . HTN (hypertension) 07/11/2013    History reviewed. No pertinent surgical history.  Prior to Admission medications   Medication Sig Start Date End Date Taking? Authorizing Provider  atorvastatin (LIPITOR) 10 MG tablet Take 1 tablet (10 mg total) by mouth daily. 06/19/18   Clapacs, Madie Reno, MD  carvedilol (COREG)  6.25 MG tablet Take 1 tablet (6.25 mg total) by mouth 2 (two) times daily. 06/19/18   Clapacs, Madie Reno, MD  divalproex (DEPAKOTE) 500 MG DR tablet Take 1 tablet (500 mg total) by mouth every 12 (twelve) hours. 07/17/18   Patrecia Pour, NP  DULoxetine (CYMBALTA) 60 MG capsule Take 1 capsule (60 mg total) by mouth daily. 06/20/18   Clapacs, Madie Reno, MD  ferrous sulfate (FEROSUL) 325 (65 FE) MG tablet Take 1 tablet (325 mg total) by mouth 2 (two) times daily. 06/19/18   Clapacs, Madie Reno, MD  gabapentin (NEURONTIN) 600 MG tablet Take 1 tablet (600 mg total) by mouth 3 (three) times daily. 06/19/18   Clapacs, Madie Reno, MD  insulin detemir (LEVEMIR) 100 UNIT/ML injection Inject 0.1 mLs (10 Units total) into the skin 2 (two) times daily. 07/17/18   Patrecia Pour, NP  lamoTRIgine (LAMICTAL) 150 MG tablet Take 1 tablet (150 mg total) by mouth at bedtime. 07/17/18   Patrecia Pour, NP  levothyroxine (SYNTHROID) 150 MCG tablet Take 1 tablet (150 mcg total) by mouth daily at 6 (six) AM. 06/19/18   Clapacs, Madie Reno, MD  lisinopril (ZESTRIL) 10 MG tablet Take 1 tablet (10 mg total) by mouth daily. 06/19/18   Clapacs, Madie Reno, MD  loratadine (CLARITIN) 10 MG tablet Take 1 tablet (10 mg total) by mouth daily. 06/19/18   Clapacs, Madie Reno, MD  lurasidone (LATUDA) 40 MG TABS tablet Take 3 tablets (120 mg total) by mouth daily. 07/17/18   Patrecia Pour, NP  metFORMIN (GLUCOPHAGE) 500 MG tablet Take 1 tablet (500  mg total) by mouth 2 (two) times daily. 06/19/18   Clapacs, Madie Reno, MD  pantoprazole (PROTONIX) 40 MG tablet Take 1 tablet (40 mg total) by mouth daily. 06/19/18   Clapacs, Madie Reno, MD  traZODone (DESYREL) 100 MG tablet Take 1 tablet (100 mg total) by mouth at bedtime. 06/19/18   Clapacs, Madie Reno, MD    Allergies Abilify [aripiprazole], Haldol [haloperidol], and Risperdal [risperidone]  No family history on file.  Social History Social History   Tobacco Use  . Smoking status: Former Research scientist (life sciences)  . Smokeless tobacco: Never  Used  Substance Use Topics  . Alcohol use: No    Frequency: Never  . Drug use: Yes    Types: Marijuana, "Crack" cocaine    Comment: reports she has not done anything in a long time    Review of Systems Constitutional: No fever/chills Eyes: No visual changes. ENT: No sore throat. Cardiovascular: Denies chest pain. Respiratory: Denies shortness of breath. Gastrointestinal: No abdominal pain.  No nausea, no vomiting.  No diarrhea.   Genitourinary: Negative for dysuria. Musculoskeletal: Negative for back pain. Skin: Negative for rash. Neurological: Negative for headaches, focal weakness or numbness.  ____________________________________________   PHYSICAL EXAM:  VITAL SIGNS: ED Triage Vitals  Enc Vitals Group     BP 07/19/18 2125 (!) 112/55     Pulse Rate 07/19/18 2125 80     Resp 07/19/18 2125 18     Temp 07/19/18 2125 98.7 F (37.1 C)     Temp Source 07/19/18 2125 Oral     SpO2 07/19/18 2125 95 %     Weight --      Height --      Head Circumference --      Peak Flow --      Pain Score 07/19/18 2126 0   Constitutional: Alert and oriented.  Eyes: Conjunctivae are normal.  ENT      Head: Normocephalic and atraumatic.      Nose: No congestion/rhinnorhea.      Mouth/Throat: Mucous membranes are moist.      Neck: No stridor. Hematological/Lymphatic/Immunilogical: No cervical lymphadenopathy. Cardiovascular: Normal rate, regular rhythm.  No murmurs, rubs, or gallops.  Respiratory: Normal respiratory effort without tachypnea nor retractions. Breath sounds are clear and equal bilaterally. No wheezes/rales/rhonchi. Gastrointestinal: Soft and non tender. No rebound. No guarding.  Genitourinary: Deferred Musculoskeletal: Normal range of motion in all extremities. No lower extremity edema. Neurologic:  Normal speech and language. No gross focal neurologic deficits are appreciated.  Skin:  Skin is warm, dry and intact. No rash noted. Psychiatric:  Calm  ____________________________________________    LABS (pertinent positives/negatives)  Acetaminophen, ethanol, salicylate below threshold CBC wbc 4.5, hgb 11.5, plt 74 CMP na 136, k 4.2, glu 148, cr 0.82  ____________________________________________   EKG  None  ____________________________________________    RADIOLOGY  None ____________________________________________   PROCEDURES  Procedures  ____________________________________________   INITIAL IMPRESSION / ASSESSMENT AND PLAN / ED COURSE  Pertinent labs & imaging results that were available during my care of the patient were reviewed by me and considered in my medical decision making (see chart for details).   She presented under IVC because of concerns for suicidal ideation.  Patient has been seen in the emergency department many times for similar complaints.  Will have psychiatry evaluate. Blood work consistent with chronic thrombocytopenia.   ____________________________________________   FINAL CLINICAL IMPRESSION(S) / ED DIAGNOSES  Final diagnoses:  Thoughts of violence     Note: This dictation was  prepared with [employer] . Any transcriptional errors that result from this process are unintentional     Nance Pear, MD 07/19/18 [phone]

## 2018-07-19 NOTE — ED Notes (Addendum)
Patient's belongings: Pair of flip flops, gray underwear, jean colored shorts, tshirt, gray sports bra. Patient dressed out by this RN and Clarise Cruz, ED tech.

## 2018-07-20 ENCOUNTER — Inpatient Hospital Stay
Admission: EM | Admit: 2018-07-20 | Discharge: 2018-08-04 | DRG: 206 | Disposition: A | Discharge status: Other Acute Inpatient Hospital | Payer: Medicare Other | Attending: Internal Medicine | Admitting: Internal Medicine

## 2018-07-20 ENCOUNTER — Other Ambulatory Visit: Payer: Self-pay

## 2018-07-20 ENCOUNTER — Encounter: Payer: Self-pay | Admitting: *Deleted

## 2018-07-20 DIAGNOSIS — F79 Unspecified intellectual disabilities: Secondary | ICD-10-CM | POA: Diagnosis present

## 2018-07-20 DIAGNOSIS — R531 Weakness: Secondary | ICD-10-CM

## 2018-07-20 DIAGNOSIS — E119 Type 2 diabetes mellitus without complications: Secondary | ICD-10-CM | POA: Diagnosis present

## 2018-07-20 DIAGNOSIS — I10 Essential (primary) hypertension: Secondary | ICD-10-CM | POA: Diagnosis present

## 2018-07-20 DIAGNOSIS — I959 Hypotension, unspecified: Secondary | ICD-10-CM | POA: Diagnosis present

## 2018-07-20 DIAGNOSIS — F918 Other conduct disorders: Secondary | ICD-10-CM | POA: Diagnosis not present

## 2018-07-20 DIAGNOSIS — Z20828 Contact with and (suspected) exposure to other viral communicable diseases: Secondary | ICD-10-CM | POA: Diagnosis present

## 2018-07-20 DIAGNOSIS — J9811 Atelectasis: Principal | ICD-10-CM | POA: Diagnosis present

## 2018-07-20 DIAGNOSIS — F603 Borderline personality disorder: Secondary | ICD-10-CM | POA: Diagnosis present

## 2018-07-20 DIAGNOSIS — R451 Restlessness and agitation: Secondary | ICD-10-CM | POA: Diagnosis not present

## 2018-07-20 DIAGNOSIS — G4700 Insomnia, unspecified: Secondary | ICD-10-CM | POA: Diagnosis present

## 2018-07-20 DIAGNOSIS — D696 Thrombocytopenia, unspecified: Secondary | ICD-10-CM | POA: Diagnosis present

## 2018-07-20 DIAGNOSIS — R5381 Other malaise: Secondary | ICD-10-CM | POA: Diagnosis present

## 2018-07-20 DIAGNOSIS — Z751 Person awaiting admission to adequate facility elsewhere: Secondary | ICD-10-CM

## 2018-07-20 DIAGNOSIS — R0902 Hypoxemia: Secondary | ICD-10-CM | POA: Diagnosis present

## 2018-07-20 DIAGNOSIS — F250 Schizoaffective disorder, bipolar type: Secondary | ICD-10-CM | POA: Diagnosis present

## 2018-07-20 DIAGNOSIS — Z794 Long term (current) use of insulin: Secondary | ICD-10-CM

## 2018-07-20 DIAGNOSIS — R4689 Other symptoms and signs involving appearance and behavior: Secondary | ICD-10-CM

## 2018-07-20 DIAGNOSIS — R45850 Homicidal ideations: Secondary | ICD-10-CM | POA: Diagnosis not present

## 2018-07-20 DIAGNOSIS — L03213 Periorbital cellulitis: Secondary | ICD-10-CM | POA: Diagnosis present

## 2018-07-20 DIAGNOSIS — Z87891 Personal history of nicotine dependence: Secondary | ICD-10-CM

## 2018-07-20 DIAGNOSIS — E039 Hypothyroidism, unspecified: Secondary | ICD-10-CM | POA: Diagnosis present

## 2018-07-20 DIAGNOSIS — K219 Gastro-esophageal reflux disease without esophagitis: Secondary | ICD-10-CM | POA: Diagnosis present

## 2018-07-20 HISTORY — DX: Hypothyroidism, unspecified: E03.9

## 2018-07-20 LAB — GLUCOSE, CAPILLARY
Glucose-Capillary: 134 mg/dL — ABNORMAL HIGH (ref 70–99)
Glucose-Capillary: 216 mg/dL — ABNORMAL HIGH (ref 70–99)

## 2018-07-20 LAB — SARS CORONAVIRUS 2 BY RT PCR (HOSPITAL ORDER, PERFORMED IN ~~LOC~~ HOSPITAL LAB): SARS Coronavirus 2: NEGATIVE

## 2018-07-20 MED ORDER — PANTOPRAZOLE SODIUM 40 MG PO TBEC
40.0000 mg | DELAYED_RELEASE_TABLET | Freq: Every day | ORAL | Status: DC
  Administered 2018-07-20: 10:00:00 40 mg via ORAL
  Filled 2018-07-20: qty 1

## 2018-07-20 MED ORDER — TRAZODONE HCL 50 MG PO TABS
100.0000 mg | ORAL_TABLET | Freq: Every day | ORAL | Status: DC
  Administered 2018-07-20 – 2018-08-03 (×14): 100 mg via ORAL
  Filled 2018-07-20: qty 2
  Filled 2018-07-20 (×2): qty 1
  Filled 2018-07-20: qty 2
  Filled 2018-07-20 (×8): qty 1
  Filled 2018-07-20: qty 2

## 2018-07-20 MED ORDER — LAMOTRIGINE 100 MG PO TABS
150.0000 mg | ORAL_TABLET | Freq: Every day | ORAL | Status: DC
  Administered 2018-07-20 – 2018-07-28 (×9): 150 mg via ORAL
  Filled 2018-07-20 (×9): qty 2

## 2018-07-20 MED ORDER — LISINOPRIL 10 MG PO TABS
10.0000 mg | ORAL_TABLET | Freq: Every day | ORAL | Status: DC
  Administered 2018-07-20: 10:00:00 10 mg via ORAL
  Filled 2018-07-20: qty 1

## 2018-07-20 MED ORDER — FERROUS SULFATE 325 (65 FE) MG PO TABS
325.0000 mg | ORAL_TABLET | Freq: Two times a day (BID) | ORAL | Status: DC
  Administered 2018-07-20: 325 mg via ORAL
  Filled 2018-07-20 (×2): qty 1

## 2018-07-20 MED ORDER — FERROUS SULFATE 325 (65 FE) MG PO TABS
325.0000 mg | ORAL_TABLET | Freq: Two times a day (BID) | ORAL | Status: DC
  Administered 2018-07-20 – 2018-08-04 (×25): 325 mg via ORAL
  Filled 2018-07-20 (×30): qty 1

## 2018-07-20 MED ORDER — CARVEDILOL 6.25 MG PO TABS
6.2500 mg | ORAL_TABLET | Freq: Two times a day (BID) | ORAL | Status: DC
  Administered 2018-07-20: 10:00:00 6.25 mg via ORAL
  Filled 2018-07-20: qty 1

## 2018-07-20 MED ORDER — ATORVASTATIN CALCIUM 20 MG PO TABS
10.0000 mg | ORAL_TABLET | Freq: Every day | ORAL | Status: DC
  Administered 2018-07-20: 10:00:00 10 mg via ORAL
  Filled 2018-07-20: qty 1

## 2018-07-20 MED ORDER — DULOXETINE HCL 60 MG PO CPEP
60.0000 mg | ORAL_CAPSULE | Freq: Every day | ORAL | Status: DC
  Administered 2018-07-20: 10:00:00 60 mg via ORAL
  Filled 2018-07-20: qty 1

## 2018-07-20 MED ORDER — ATORVASTATIN CALCIUM 20 MG PO TABS
10.0000 mg | ORAL_TABLET | Freq: Every day | ORAL | Status: DC
  Administered 2018-07-21 – 2018-08-04 (×13): 10 mg via ORAL
  Filled 2018-07-20 (×13): qty 1

## 2018-07-20 MED ORDER — LAMOTRIGINE 25 MG PO TABS: 150.0000 mg | ORAL_TABLET | Freq: Every day | ORAL | Status: DC

## 2018-07-20 MED ORDER — DIVALPROEX SODIUM 500 MG PO DR TAB
500.0000 mg | DELAYED_RELEASE_TABLET | Freq: Two times a day (BID) | ORAL | Status: DC
  Administered 2018-07-20: 10:00:00 500 mg via ORAL
  Filled 2018-07-20: qty 1

## 2018-07-20 MED ORDER — METFORMIN HCL 500 MG PO TABS
500.0000 mg | ORAL_TABLET | Freq: Two times a day (BID) | ORAL | Status: DC
  Administered 2018-07-20: 10:00:00 500 mg via ORAL
  Filled 2018-07-20: qty 1

## 2018-07-20 MED ORDER — INSULIN DETEMIR 100 UNIT/ML ~~LOC~~ SOLN
10.0000 [IU] | Freq: Two times a day (BID) | SUBCUTANEOUS | Status: DC
  Administered 2018-07-20: 10:00:00 10 [IU] via SUBCUTANEOUS
  Filled 2018-07-20 (×2): qty 0.1

## 2018-07-20 MED ORDER — PANTOPRAZOLE SODIUM 40 MG PO TBEC
40.0000 mg | DELAYED_RELEASE_TABLET | Freq: Every day | ORAL | Status: DC
  Administered 2018-07-21 – 2018-08-04 (×13): 40 mg via ORAL
  Filled 2018-07-20 (×13): qty 1

## 2018-07-20 MED ORDER — LORATADINE 10 MG PO TABS
10.0000 mg | ORAL_TABLET | Freq: Every day | ORAL | Status: DC
  Administered 2018-07-20: 10 mg via ORAL
  Filled 2018-07-20: qty 1

## 2018-07-20 MED ORDER — DULOXETINE HCL 60 MG PO CPEP
60.0000 mg | ORAL_CAPSULE | Freq: Every day | ORAL | Status: DC
  Administered 2018-07-21 – 2018-07-28 (×8): 60 mg via ORAL
  Filled 2018-07-20 (×9): qty 1

## 2018-07-20 MED ORDER — LEVOTHYROXINE SODIUM 50 MCG PO TABS
150.0000 ug | ORAL_TABLET | Freq: Every day | ORAL | Status: DC
  Administered 2018-07-21 – 2018-08-04 (×13): 150 ug via ORAL
  Filled 2018-07-20: qty 3
  Filled 2018-07-20 (×2): qty 2
  Filled 2018-07-20: qty 1
  Filled 2018-07-20: qty 2
  Filled 2018-07-20: qty 1
  Filled 2018-07-20 (×2): qty 2
  Filled 2018-07-20: qty 1
  Filled 2018-07-20 (×5): qty 2
  Filled 2018-07-20 (×2): qty 1
  Filled 2018-07-20 (×4): qty 2

## 2018-07-20 MED ORDER — CARVEDILOL 6.25 MG PO TABS
6.2500 mg | ORAL_TABLET | Freq: Two times a day (BID) | ORAL | Status: DC
  Administered 2018-07-20 – 2018-07-31 (×20): 6.25 mg via ORAL
  Filled 2018-07-20 (×29): qty 1

## 2018-07-20 MED ORDER — LURASIDONE HCL 40 MG PO TABS
120.0000 mg | ORAL_TABLET | Freq: Every day | ORAL | Status: DC
  Administered 2018-07-20: 10:00:00 120 mg via ORAL
  Filled 2018-07-20: qty 3

## 2018-07-20 MED ORDER — TRAZODONE HCL 100 MG PO TABS: 100.0000 mg | ORAL_TABLET | Freq: Every day | ORAL | Status: DC

## 2018-07-20 MED ORDER — LORATADINE 10 MG PO TABS
10.0000 mg | ORAL_TABLET | Freq: Every day | ORAL | Status: DC
  Administered 2018-07-21 – 2018-07-28 (×8): 10 mg via ORAL
  Filled 2018-07-20 (×8): qty 1

## 2018-07-20 MED ORDER — LISINOPRIL 10 MG PO TABS
10.0000 mg | ORAL_TABLET | Freq: Every day | ORAL | Status: DC
  Administered 2018-07-21 – 2018-07-30 (×8): 10 mg via ORAL
  Filled 2018-07-20 (×5): qty 2
  Filled 2018-07-20: qty 1
  Filled 2018-07-20 (×2): qty 2

## 2018-07-20 MED ORDER — INSULIN DETEMIR 100 UNIT/ML ~~LOC~~ SOLN
10.0000 [IU] | Freq: Two times a day (BID) | SUBCUTANEOUS | Status: DC
  Administered 2018-07-21 – 2018-07-30 (×17): 10 [IU] via SUBCUTANEOUS
  Filled 2018-07-20 (×23): qty 0.1

## 2018-07-20 MED ORDER — LEVOTHYROXINE SODIUM 50 MCG PO TABS: 150.0000 ug | ORAL_TABLET | Freq: Every day | ORAL | Status: DC

## 2018-07-20 MED ORDER — LURASIDONE HCL 40 MG PO TABS
120.0000 mg | ORAL_TABLET | Freq: Every day | ORAL | Status: DC
  Administered 2018-07-21: 120 mg via ORAL
  Filled 2018-07-20: qty 3

## 2018-07-20 MED ORDER — METFORMIN HCL 500 MG PO TABS
500.0000 mg | ORAL_TABLET | Freq: Two times a day (BID) | ORAL | Status: DC
  Administered 2018-07-21 – 2018-07-31 (×16): 500 mg via ORAL
  Filled 2018-07-20 (×16): qty 1

## 2018-07-20 MED ORDER — DIVALPROEX SODIUM 500 MG PO DR TAB
500.0000 mg | DELAYED_RELEASE_TABLET | Freq: Two times a day (BID) | ORAL | Status: DC
  Administered 2018-07-20 – 2018-08-04 (×27): 500 mg via ORAL
  Filled 2018-07-20 (×30): qty 1

## 2018-07-20 MED ORDER — GABAPENTIN 600 MG PO TABS
600.0000 mg | ORAL_TABLET | Freq: Three times a day (TID) | ORAL | Status: DC
  Administered 2018-07-20: 600 mg via ORAL
  Filled 2018-07-20: qty 1

## 2018-07-20 MED ORDER — GABAPENTIN 600 MG PO TABS
600.0000 mg | ORAL_TABLET | Freq: Three times a day (TID) | ORAL | Status: DC
  Administered 2018-07-20 – 2018-07-21 (×2): 600 mg via ORAL
  Filled 2018-07-20 (×2): qty 1

## 2018-07-20 NOTE — Consult Note (Signed)
Telepsych Consultation   Reason for Consult: Homicidal ideation, aggression Referring Physician: EDP Location of Patient: [facility] ED Location of Provider: [facility]  Patient Identification: Jessica [name] MRN:  [id number] Principal Diagnosis: Agitation Diagnosis:  Principal Problem:   Agitation Active Problems:   Diabetes mellitus without complication ([place])   Borderline personality disorder ([place])   Homicidal ideation   Atypical glandular cells of undetermined significance (AGUS) on cervical Pap smear   Total Time spent with patient: 30 minutes  Subjective:   Jessica [name] is a 53 y.o. female patient .  Presented to [facility] ED via law enforcement under involuntary commitment as she reported to group home staff that she was going to hurt her and her roommate.  She stated her wish both could die.   HPI: Jessica [name] is a 53 year old female who was assessed again this morning, stated that she was angry last night, got upset with her roommate as she was talking in the room on the telephone.  Patient adds that she got frustrated, angry and made comments of hurting herself and her roommate.  Patient adds that she has no plans to hurt herself or her roommate, has calm down, is okay with going back to the group home.  She states that she knows she has problems with anger, behavior and is okay with seeing a counselor to help her with this.  Patient denies any psychotic symptoms, having any thoughts of hurting herself, having any thoughts of hurting others.  Patient states that she misses her mom who passed away a year ago, reports that she has siblings, 2 sisters and 2 brothers and adds that her brothers live in [place].  Patient states that her family is supportive.  Patient has that she is excited about Fourth of July as she gets to see fireworks.  Past Psychiatric History: Unchanged from previous visit  Risk to Self:   Risk to Others:   Prior Inpatient Therapy:   Prior Outpatient  Therapy:  Needs to see an outpatient therapist, is followed up at [facility] behavioral health by a psychiatrist  Past Medical History:  Past Medical History:  Diagnosis Date  . Anxiety   . Depression   . Diabetes mellitus without complication ([place])   . GERD (gastroesophageal reflux disease)   . Homicidal ideation   . Hypertension    History reviewed. No pertinent surgical history. Family History: No family history on file. Family Psychiatric  History: Patient states that she does not know anyone with psychiatric disorder Social History:  Social History   Substance and Sexual Activity  Alcohol Use No  . Frequency: Never     Social History   Substance and Sexual Activity  Drug Use Yes  . Types: Marijuana, "Crack" cocaine   Comment: reports she has not done anything in a long time    Social History   Socioeconomic History  . Marital status: Single    Spouse name: Not on file  . Number of children: Not on file  . Years of education: Not on file  . Highest education level: Not on file  Occupational History  . Not on file  Social Needs  . Financial resource strain: Not on file  . Food insecurity    Worry: Not on file    Inability: Not on file  . Transportation needs    Medical: Not on file    Non-medical: Not on file  Tobacco Use  . Smoking status: Former Research scientist (life sciences)  . Smokeless tobacco: Never Used  Substance and Sexual Activity  . Alcohol use: No    Frequency: Never  . Drug use: Yes    Types: Marijuana, "Crack" cocaine    Comment: reports she has not done anything in a long time  . Sexual activity: Not Currently    Birth control/protection: Abstinence  Lifestyle  . Physical activity    Days per week: Not on file    Minutes per session: Not on file  . Stress: Not on file  Relationships  . Social Herbalist on phone: Not on file    Gets together: Not on file    Attends religious service: Not on file    Active member of club or organization: Not on file     Attends meetings of clubs or organizations: Not on file    Relationship status: Not on file  Other Topics Concern  . Not on file  Social History Narrative  . Not on file   Additional Social History:    Allergies:   Allergies  Allergen Reactions  . Abilify [Aripiprazole] Other (See Comments)    Chokes on food  . Haldol [Haloperidol] Other (See Comments)    Dizziness  . Risperdal [Risperidone] Other (See Comments)    Leg weakness    Labs:  Results for orders placed or performed during the hospital encounter of 07/19/18 (from the past 48 hour(s))  Comprehensive metabolic panel     Status: Abnormal   Collection Time: 07/19/18  9:31 PM  Result Value Ref Range   Sodium 136 135 - 145 mmol/L   Potassium 4.2 3.5 - 5.1 mmol/L   Chloride 95 (L) 98 - 111 mmol/L   CO2 31 22 - 32 mmol/L   Glucose, Bld 148 (H) 70 - 99 mg/dL   BUN 15 6 - 20 mg/dL   Creatinine, Ser 0.82 0.44 - 1.00 mg/dL   Calcium 8.5 (L) 8.9 - 10.3 mg/dL   Total Protein 7.4 6.5 - 8.1 g/dL   Albumin 4.1 3.5 - 5.0 g/dL   AST 22 15 - 41 U/L   ALT 22 0 - 44 U/L   Alkaline Phosphatase 70 38 - 126 U/L   Total Bilirubin 0.6 0.3 - 1.2 mg/dL   GFR calc non Af Amer >60 >60 mL/min   GFR calc Af Amer >60 >60 mL/min   Anion gap 10 5 - 15    Comment: Performed at [facility], [address] [place], [place] [address]  Ethanol     Status: None   Collection Time: 07/19/18  9:31 PM  Result Value Ref Range   Alcohol, Ethyl (B) <10 <10 mg/dL    Comment: (NOTE) Lowest detectable limit for serum alcohol is 10 mg/dL. For medical purposes only. Performed at [facility], [address] [place], [place] [address]   Salicylate level     Status: None   Collection Time: 07/19/18  9:31 PM  Result Value Ref Range   Salicylate Lvl <2.8 2.8 - 30.0 mg/dL    Comment: Performed at [facility], [place],  [address]  Acetaminophen level     Status: Abnormal   Collection Time:  07/19/18  9:31 PM  Result Value Ref Range   Acetaminophen (Tylenol), Serum <10 (L) 10 - 30 ug/mL    Comment: (NOTE) Therapeutic concentrations vary significantly. A range of 10-30 ug/mL  may be an effective concentration for many patients. However, some  are best treated at concentrations outside of this range.  Acetaminophen concentrations >150 ug/mL at 4 hours after ingestion  and >50 ug/mL at 12 hours after ingestion are often associated with  toxic reactions. Performed at [facility], [address] [place], [place] [address]   cbc     Status: Abnormal   Collection Time: 07/19/18  9:31 PM  Result Value Ref Range   WBC 4.5 4.0 - 10.5 K/uL   RBC 3.69 (L) 3.87 - 5.11 MIL/uL   Hemoglobin 11.5 (L) 12.0 - 15.0 g/dL   HCT 34.8 (L) 36.0 - 46.0 %   MCV 94.3 80.0 - 100.0 fL   MCH 31.2 26.0 - 34.0 pg   MCHC 33.0 30.0 - 36.0 g/dL   RDW 12.9 11.5 - 15.5 %   Platelets 74 (L) 150 - 400 K/uL    Comment: Immature Platelet Fraction may be clinically indicated, consider ordering this additional test [id number]    nRBC 0.0 0.0 - 0.2 %    Comment: Performed at [facility], [address], [place], [place] [address]  SARS Coronavirus 2 (CEPHEID - Performed in [place] hospital lab), Hosp Order     Status: None   Collection Time: 07/20/18  7:51 AM   Specimen: Nasopharyngeal Swab  Result Value Ref Range   SARS Coronavirus 2 NEGATIVE NEGATIVE    Comment: (NOTE) If result is NEGATIVE SARS-CoV-2 target nucleic acids are NOT DETECTED. The SARS-CoV-2 RNA is generally detectable in upper and lower  respiratory specimens during the acute phase of infection. The lowest  concentration of SARS-CoV-2 viral copies this assay can detect is 250  copies / mL. A negative result does not preclude SARS-CoV-2 infection  and should not be used as the sole basis for treatment or other  patient management decisions.  A negative result may occur with  improper specimen collection /  handling, submission of specimen other  than nasopharyngeal swab, presence of viral mutation(s) within the  areas targeted by this assay, and inadequate number of viral copies  (<250 copies / mL). A negative result must be combined with clinical  observations, patient history, and epidemiological information. If result is POSITIVE SARS-CoV-2 target nucleic acids are DETECTED. The SARS-CoV-2 RNA is generally detectable in upper and lower  respiratory specimens dur ing the acute phase of infection.  Positive  results are indicative of active infection with SARS-CoV-2.  Clinical  correlation with patient history and other diagnostic information is  necessary to determine patient infection status.  Positive results do  not rule out bacterial infection or co-infection with other viruses. If result is PRESUMPTIVE POSTIVE SARS-CoV-2 nucleic acids MAY BE PRESENT.   A presumptive positive result was obtained on the submitted specimen  and confirmed on repeat testing.  While 2019 novel coronavirus  (SARS-CoV-2) nucleic acids may be present in the submitted sample  additional confirmatory testing may be necessary for epidemiological  and / or clinical management purposes  to differentiate between  SARS-CoV-2 and other Sarbecovirus currently known to infect humans.  If clinically indicated additional testing with an alternate test  methodology [phone]) is advised. The SARS-CoV-2 RNA is generally  detectable in upper and lower respiratory sp ecimens during the acute  phase of infection. The expected result is Negative. Fact Sheet for Patients:  StrictlyIdeas.no Fact Sheet for Healthcare Providers: BankingDealers.co.za This test is not yet approved or cleared by the [place] FDA and has been authorized for detection and/or diagnosis of SARS-CoV-2 by FDA under an Emergency Use Authorization (EUA).  This EUA will remain  in effect (meaning this  test can be used) for the duration of the COVID-19 declaration under Section 564(b)(1) of the Act, 21 U.S.C. section 360bbb-3(b)(1), unless the authorization is terminated or revoked sooner. Performed at [facility], [address] [place], [place] [address]   Glucose, capillary     Status: Abnormal   Collection Time: 07/20/18  8:46 AM  Result Value Ref Range   Glucose-Capillary 134 (H) 70 - 99 mg/dL   Comment 1 Notify RN     Medications:  Current Facility-Administered Medications  Medication Dose Route Frequency Provider Last Rate Last Dose  . atorvastatin (LIPITOR) tablet 10 mg  10 mg Oral Daily Duffy Bruce, MD   10 mg at 07/20/18 1011  . carvedilol (COREG) tablet 6.25 mg  6.25 mg Oral BID Duffy Bruce, MD   6.25 mg at 07/20/18 1012  . divalproex (DEPAKOTE) DR tablet 500 mg  500 mg Oral Q12H Duffy Bruce, MD   500 mg at 07/20/18 1012  . DULoxetine (CYMBALTA) DR capsule 60 mg  60 mg Oral Daily Duffy Bruce, MD   60 mg at 07/20/18 1011  . ferrous sulfate tablet 325 mg  325 mg Oral BID Duffy Bruce, MD   325 mg at 07/20/18 1012  . gabapentin (NEURONTIN) tablet 600 mg  600 mg Oral TID Duffy Bruce, MD   600 mg at 07/20/18 1011  . insulin detemir (LEVEMIR) injection 10 Units  10 Units Subcutaneous BID Duffy Bruce, MD   10 Units at 07/20/18 1013  . lamoTRIgine (LAMICTAL) tablet 150 mg  150 mg Oral QHS Duffy Bruce, MD      . levothyroxine (SYNTHROID) tablet 150 mcg  150 mcg Oral Q0600 Duffy Bruce, MD      . lisinopril (ZESTRIL) tablet 10 mg  10 mg Oral Daily Duffy Bruce, MD   10 mg at 07/20/18 1012  . loratadine (CLARITIN) tablet 10 mg  10 mg Oral Daily Duffy Bruce, MD   10 mg at 07/20/18 1011  . lurasidone (LATUDA) tablet 120 mg  120 mg Oral Daily Duffy Bruce, MD   120 mg at 07/20/18 1013  . metFORMIN (GLUCOPHAGE) tablet 500 mg  500 mg Oral BID Duffy Bruce, MD   500 mg at 07/20/18 1012  . pantoprazole (PROTONIX) EC tablet 40 mg  40  mg Oral Daily Duffy Bruce, MD   40 mg at 07/20/18 1011  . traZODone (DESYREL) tablet 100 mg  100 mg Oral QHS Duffy Bruce, MD       Current Outpatient Medications  Medication Sig Dispense Refill  . atorvastatin (LIPITOR) 10 MG tablet Take 1 tablet (10 mg total) by mouth daily. 30 tablet 1  . carvedilol (COREG) 6.25 MG tablet Take 1 tablet (6.25 mg total) by mouth 2 (two) times daily. 60 tablet 1  . divalproex (DEPAKOTE) 500 MG DR tablet Take 1 tablet (500 mg total) by mouth every 12 (twelve) hours. 60 tablet 1  . DULoxetine (CYMBALTA) 60 MG capsule Take 1 capsule (60 mg total) by mouth daily. 30 capsule 1  . ferrous sulfate (FEROSUL) 325 (65 FE) MG tablet Take 1 tablet (325 mg total) by mouth 2 (two) times daily. 60 tablet 1  . gabapentin (NEURONTIN) 600 MG tablet Take 1 tablet (600 mg total) by mouth 3 (three) times daily. 90 tablet 1  . insulin detemir (LEVEMIR) 100 UNIT/ML injection Inject 0.1 mLs (10 Units total) into the skin 2 (two) times daily. 10 mL 11  . lamoTRIgine (LAMICTAL) 150 MG tablet  Take 1 tablet (150 mg total) by mouth at bedtime. 30 tablet 1  . levothyroxine (SYNTHROID) 150 MCG tablet Take 1 tablet (150 mcg total) by mouth daily at 6 (six) AM. 30 tablet 1  . lisinopril (ZESTRIL) 10 MG tablet Take 1 tablet (10 mg total) by mouth daily. 30 tablet 1  . loratadine (CLARITIN) 10 MG tablet Take 1 tablet (10 mg total) by mouth daily. 30 tablet 1  . lurasidone (LATUDA) 40 MG TABS tablet Take 3 tablets (120 mg total) by mouth daily. 90 tablet 1  . metFORMIN (GLUCOPHAGE) 500 MG tablet Take 1 tablet (500 mg total) by mouth 2 (two) times daily. 60 tablet 1  . pantoprazole (PROTONIX) 40 MG tablet Take 1 tablet (40 mg total) by mouth daily. 30 tablet 1  . traZODone (DESYREL) 100 MG tablet Take 1 tablet (100 mg total) by mouth at bedtime. 30 tablet 1     Psychiatric Specialty Exam: Physical Exam  ROS  Blood pressure 113/61, pulse 74, temperature 98.7 F (37.1 C), temperature  source Oral, resp. rate 16, SpO2 99 %.There is no height or weight on file to calculate BMI.  General Appearance: Casual  Eye Contact:  Good  Speech:  Clear and Coherent and Normal Rate  Volume:  Normal  Mood:  Euthymic  Affect:  Appropriate, Congruent and Full Range  Thought Process:  Coherent, Goal Directed and Descriptions of Associations: Intact  Orientation:  Full (Time, Place, and Person)  Thought Content:  WDL  Suicidal Thoughts:  No  Homicidal Thoughts:  No  Memory:  Immediate;   Fair Recent;   Fair Remote;   Fair  Judgement:  Impaired  Insight:  Present  Psychomotor Activity:  Normal  Concentration:  Concentration: Fair and Attention Span: Fair  Recall:  [employer] of Knowledge:  Fair  Language:  Fair  Akathisia:  No  Handed:  Right  AIMS (if indicated):     Assets:  Desire for Improvement Financial Resources/Insurance Housing Social Support  ADL's:  Intact  Cognition:  Impaired,  Mild  Sleep:        Treatment Plan Summary: Plan Patient seems to have calmed down with staying in the ED overnight, is not suicidal, homicidal or psychotic.  Patient is willing to work with her behaviors, can be discharged back to the group home  Disposition: No evidence of imminent risk to self or others at present.   Supportive therapy provided about ongoing stressors. Discussed crisis plan, support from social network, calling 911, coming to the Emergency Department, and calling Suicide Hotline.  This service was provided via telemedicine using a 2-way, interactive audio and video technology.  Names of all persons participating in this telemedicine service and their role in this encounter. Name: Patient Role: patient  Name: Hampton Abbot Role: Psychiatrist    Hampton Abbot, MD 07/20/2018 10:20 AM

## 2018-07-20 NOTE — BH Assessment (Addendum)
TTS has made several attempts to contact pt's group home Engineer, civil (consulting) at [facility] [phone]) - no answer.  Will continue to contact group home to arrange p/u at discharge.  Attempted to call Tivis Ringer: [phone] to arrange p/u. She requested that pt's medications be adjusted to treat her aggressive and anger outburst. She believes none of her medications are working.  Pt is currently prescribed: - Insulin - 15units - Atorvastatin - 10mg   Daily - Iron - 325mg  twice  Day - Levimir - 15 units - Gabapentin - 600mg  twice a da - Levothyroxine - 15mg  - Lisinopril - 10mg  - Fluoxintine - 5mg  twice a day - Latuda - 120mg  - Melatonin - 5mg  - Duloxetine - 60mg  - Metformin  - Trazodone - 100mg  - Pantoprazole - 40mg 

## 2018-07-20 NOTE — ED Notes (Signed)
Pt in hallway in recliner.  Pt sleeping.

## 2018-07-20 NOTE — ED Notes (Signed)
ED BHU PLACEMENT JUSTIFICATION Is the patient under IVC or is there intent for IVC: Yes.   Is the patient medically cleared: Yes.   Is there vacancy in the ED BHU: Yes.   Is the population mix appropriate for patient: Yes.   Is the patient awaiting placement in inpatient or outpatient setting: Yes.   Has the patient had a psychiatric consult: Yes.   Survey of unit performed for contraband, proper placement and condition of furniture, tampering with fixtures in bathroom, shower, and each patient room: Yes.  ; Findings:  APPEARANCE/BEHAVIOR Calm and cooperative NEURO ASSESSMENT Orientation: oriented x3  Denies pain Hallucinations: No.None noted (Hallucinations) denies Speech: Normal Gait: normal RESPIRATORY ASSESSMENT Even  Unlabored respirations  CARDIOVASCULAR ASSESSMENT Pulses equal   regular rate  Skin warm and dry   GASTROINTESTINAL ASSESSMENT no GI complaint EXTREMITIES Full ROM  PLAN OF CARE Provide calm/safe environment. Vital signs assessed twice daily. ED BHU Assessment once each 12-hour shift.  Assure the ED provider has rounded once each shift. Provide and encourage hygiene. Provide redirection as needed. Assess for escalating behavior; address immediately and inform ED provider.  Assess family dynamic and appropriateness for visitation as needed: Yes.  ; If necessary, describe findings:  Educate the patient/family about BHU procedures/visitation: Yes.  ; If necessary, describe findings:   

## 2018-07-20 NOTE — ED Notes (Signed)
Received report from Miner at [phone]. Orders reviewed. Pt endorses SI, HI, and AVH consistent with previous presentations. She says she plans to harm herself by stepping into traffic or harm others with a gun. She says she does not have access to a firearm. Pt contracts for safety. She would like it noted that she would like placement at [facility] and not another group home. She has asked to speak with Kennyth Lose NP, who did speak with pt in her room. Pt requires limit setting due to multiple requests. She is calm/cooperative at this time. Will continue to monitor for needs/safety.

## 2018-07-20 NOTE — ED Notes (Signed)
Report off to andrea rn   Pt moved to 20h

## 2018-07-20 NOTE — ED Notes (Signed)
Pt given snack. 

## 2018-07-20 NOTE — ED Notes (Signed)
BEHAVIORAL HEALTH ROUNDING Patient sleeping: No. Patient alert and oriented: yes Behavior appropriate: Yes.  ; If no, describe:  Nutrition and fluids offered: yes Toileting and hygiene offered: Yes  Sitter present: q15 minute observations and security camera monitoring Law enforcement present: Yes  ODS  

## 2018-07-20 NOTE — ED Notes (Signed)
Pt angry, unwilling to speak with EDP or RN.  Pt verbally agressive, making racial slurs at staff.  Pt attempted to Engineer, civil (consulting).  Maintained on 15 minute checks.

## 2018-07-20 NOTE — ED Notes (Signed)
Pt given phone to use at this time

## 2018-07-20 NOTE — ED Triage Notes (Signed)
First RN Note: Pt presents to ED via [employer] under IVC papers with c/o becoming aggressive with people at her group home. Per [employer] pt using racial slurs with people at her group home after wandering off from group home. [employer] reports pt picking up items and attempting to throw them at [employer] officers.

## 2018-07-20 NOTE — ED Notes (Addendum)
No blood work at this time per dr issacs.

## 2018-07-20 NOTE — ED Notes (Signed)
Pt discharged back to group home. VS stable. Discharge instructions reviewed with patient. All belongings returned to patient. Pt denies pain, SI/HI. Pt signed for discharge.

## 2018-07-20 NOTE — ED Notes (Signed)
Pt dressing for discharge.  
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
## 2018-07-20 NOTE — ED Notes (Signed)
Pt took a shower at 6:30pm.
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
## 2018-07-20 NOTE — ED Notes (Signed)
Pt transferred into ED BHU room 2   Patient assigned to appropriate care area. Patient oriented to unit/care area: Informed that, for their safety, care areas are designed for safety and monitored by security cameras at all times;  phone times explained to patient. Patient verbalizes understanding, and verbal contract for safety obtained.    She denies pain   Assessment completed

## 2018-07-20 NOTE — ED Provider Notes (Signed)
-----------------------------------------   11:56 AM on 07/20/2018 -----------------------------------------  Cleared by Psychiatry. IVC Rescinded. Remains HDS, well appearing, medically clear.   Duffy Bruce, MD 07/20/18 [phone]

## 2018-07-20 NOTE — BH Assessment (Signed)
TTS spoke with group home staff and they are in route to pick-up patient from ED and will arrive in 30 mins per Tivis Ringer.  This information has been relayed to pt's nurse.

## 2018-07-20 NOTE — ED Notes (Signed)
IVC prior to arrival/Consult ordered/Moved to BHU-2
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
## 2018-07-20 NOTE — ED Notes (Addendum)
Pt resting on stretcher in hallway with eyes closed and even respirations. No increased work of breathing or acute distress noted at this time. Q15 min rounding performed by Engineer, drilling to continue.

## 2018-07-20 NOTE — BH Assessment (Addendum)
TTS spoke to [employer]: [phone] (group home staff) who reports she does not know when or what time someone will arrive to the ED to pick-up pt. She further reports "I honestly don't want to pick her - I'm going to be honest with you".  This Probation officer also told her that pt would need to follow-up with her outpatient provider to inquire about med changes/adjustments. She stated "her next appointment isn't scheduled until 08/03/2018". TTS informed her that pt can have an earlier appointment scheduled.  Tivis Ringer states she will call back after she speaks to the group home owner. Call ended.

## 2018-07-20 NOTE — ED Notes (Signed)
Breakfast tray provided to pt at this time.  

## 2018-07-20 NOTE — ED Notes (Signed)
Pt resting on stretcher in hallway with eyes closed and even respirations. No distress noted. Q15 min rounding done by Photographer. Monitoring will continue.

## 2018-07-20 NOTE — ED Notes (Signed)
Pt resting on stretcher in the hallway with eyes closed and even respirations. No distress noted. Q15 min rounding done by officer and rover will continue.

## 2018-07-20 NOTE — ED Notes (Signed)
Psych consult on at bedside at this time

## 2018-07-21 DIAGNOSIS — F4329 Adjustment disorder with other symptoms: Secondary | ICD-10-CM

## 2018-07-21 LAB — GLUCOSE, CAPILLARY: Glucose-Capillary: 124 mg/dL — ABNORMAL HIGH (ref 70–99)

## 2018-07-21 MED ORDER — GABAPENTIN 400 MG PO CAPS
400.0000 mg | ORAL_CAPSULE | Freq: Three times a day (TID) | ORAL | Status: DC
  Administered 2018-07-21 – 2018-07-28 (×21): 400 mg via ORAL
  Filled 2018-07-21 (×3): qty 4
  Filled 2018-07-21: qty 1
  Filled 2018-07-21: qty 4
  Filled 2018-07-21: qty 1
  Filled 2018-07-21 (×2): qty 4
  Filled 2018-07-21: qty 1
  Filled 2018-07-21: qty 4
  Filled 2018-07-21: qty 1
  Filled 2018-07-21: qty 4
  Filled 2018-07-21: qty 1
  Filled 2018-07-21: qty 4
  Filled 2018-07-21: qty 1
  Filled 2018-07-21: qty 4
  Filled 2018-07-21 (×2): qty 1
  Filled 2018-07-21: qty 4
  Filled 2018-07-21 (×5): qty 1
  Filled 2018-07-21: qty 4
  Filled 2018-07-21 (×6): qty 1
  Filled 2018-07-21 (×2): qty 4
  Filled 2018-07-21: qty 1
  Filled 2018-07-21: qty 4
  Filled 2018-07-21: qty 1
  Filled 2018-07-21: qty 4
  Filled 2018-07-21 (×2): qty 1
  Filled 2018-07-21: qty 4
  Filled 2018-07-21 (×5): qty 1
  Filled 2018-07-21: qty 4
  Filled 2018-07-21: qty 1

## 2018-07-21 MED ORDER — LURASIDONE HCL 40 MG PO TABS
80.0000 mg | ORAL_TABLET | Freq: Every day | ORAL | Status: DC
  Administered 2018-07-22 – 2018-07-23 (×2): 80 mg via ORAL
  Filled 2018-07-21 (×2): qty 2

## 2018-07-21 NOTE — ED Notes (Signed)
She is speaking with Dr Einar Grad on the TTS computer at this time Pt observed with no unusual behavior  Appropriate to stimulation  No verbalized needs or concerns at this time  NAD assessed  Continue to monitor   Report received from [place] RN

## 2018-07-21 NOTE — TOC Progression Note (Signed)
Transition of Care [facility]) - Progression Note    Patient Details  Name: Jessica [name] MRN: [id number] Date of Birth: Aug 23, 1965  [facility] CM/SW Contact  Marshell Garfinkel, RN Phone Number: 07/21/2018, 8:44 AM  Clinical Narrative:     CM consult received for new group home- it will be difficult to place patient with the behaviors she is presenting.         Expected Discharge Plan and Services                                                 Social Determinants of Health (SDOH) Interventions    Readmission Risk Interventions No flowsheet data found.

## 2018-07-21 NOTE — ED Notes (Signed)
Pt talked to sister on the phone and then went back to her room. Roughly five minutes after speaking with sister, pt came to the door and started kicking the door and had a pillowcase wrapped around her neck making signals that she wanted to tighten the pillowcase. Pillowcase as well as sheet and blanket was taken out of pt room. Chair was also removed from pt rm and placed in day rm.

## 2018-07-21 NOTE — ED Notes (Signed)

## 2018-07-21 NOTE — ED Notes (Signed)
Pt awake at this time

## 2018-07-21 NOTE — ED Notes (Signed)
BEHAVIORAL HEALTH ROUNDING Patient sleeping: No. Patient alert and oriented: yes Behavior appropriate: Yes.  ; If no, describe:  Nutrition and fluids offered: yes Toileting and hygiene offered: Yes  Sitter present: q15 minute observations and security camera monitoring Law enforcement present: Yes  ODS  

## 2018-07-21 NOTE — ED Notes (Signed)
IVC/  PENDING  PLACEMENT 

## 2018-07-21 NOTE — ED Notes (Signed)
ED BHU PLACEMENT JUSTIFICATION Is the patient under IVC or is there intent for IVC: Yes.   Is the patient medically cleared: Yes.   Is there vacancy in the ED BHU: Yes.   Is the population mix appropriate for patient: Yes.   Is the patient awaiting placement in inpatient or outpatient setting: Yes.   Has the patient had a psychiatric consult: Yes.   Survey of unit performed for contraband, proper placement and condition of furniture, tampering with fixtures in bathroom, shower, and each patient room: Yes.  ; Findings:  APPEARANCE/BEHAVIOR Calm and cooperative NEURO ASSESSMENT Orientation: oriented x3  Denies pain Hallucinations: No.None noted (Hallucinations) denies Speech: Normal Gait: normal RESPIRATORY ASSESSMENT Even  Unlabored respirations  CARDIOVASCULAR ASSESSMENT Pulses equal   regular rate  Skin warm and dry   GASTROINTESTINAL ASSESSMENT no GI complaint EXTREMITIES Full ROM  PLAN OF CARE Provide calm/safe environment. Vital signs assessed twice daily. ED BHU Assessment once each 12-hour shift.  Assure the ED provider has rounded once each shift. Provide and encourage hygiene. Provide redirection as needed. Assess for escalating behavior; address immediately and inform ED provider.  Assess family dynamic and appropriateness for visitation as needed: Yes.  ; If necessary, describe findings:  Educate the patient/family about BHU procedures/visitation: Yes.  ; If necessary, describe findings:   

## 2018-07-21 NOTE — ED Notes (Signed)
Pt came to door at nurses station and threw a whole cup of drink at door.

## 2018-07-21 NOTE — ED Notes (Signed)
Pt given graham crackers and peanut butter.
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
## 2018-07-21 NOTE — TOC Progression Note (Addendum)
Transition of Care [facility]) - Progression Note    Patient Details  Name: LANDY MACE MRN: [id number] Date of Birth: 1965-07-31  [facility]) CM/SW Contact  Tania Hanley Woerner, LCSW Phone Number: 07/21/2018, 2:34 PM  Clinical Narrative:     CSW spoke with psychiatrist, and EDP. Pt will be observed for the next 24 hours to see if her behaviors have changed so IVC can be rescinded for new group home placement. Psychatrist and EDP believe pt just hates her group home. Psychiatrist shared that she is already on five mood stabilizers, and can only taper her medications.  Psychiatrist plans to see pt again this afternoon.        Expected Discharge Plan and Services                                                 Social Determinants of Health (SDOH) Interventions    Readmission Risk Interventions No flowsheet data found.
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
## 2018-07-21 NOTE — ED Notes (Signed)
Pt given ginger ale.

## 2018-07-21 NOTE — ED Notes (Signed)
Portable teleconsult set up at pt bedside for pt to be seen by psych

## 2018-07-21 NOTE — ED Notes (Signed)
Pt talked with me about feeling angry towards her sister because she states her sister " doesn't understand"    Linens provided   - assistance given to find the TV station she wanted  Continue to monitor

## 2018-07-21 NOTE — ED Notes (Signed)
Pt eating breakfast at thsi time

## 2018-07-21 NOTE — ED Notes (Signed)
Pt given meal tray and a ginger ale.

## 2018-07-21 NOTE — ED Notes (Signed)
Pt says she is seeing snakes on her hands. Emotional support given. Pt returned to her room. Will continue to monitor for needs/safety.

## 2018-07-21 NOTE — ED Provider Notes (Signed)
-----------------------------------------   7:25 AM on 07/21/2018 -----------------------------------------   Blood pressure 107/63, pulse 88, temperature 97.7 F (36.5 C), temperature source Oral, resp. rate 19, SpO2 98 %.  The patient is calm and cooperative at this time.  There have been no acute events since the last update.  Awaiting disposition plan from Behavioral Medicine team.    Schuyler Amor, MD 07/21/18 [phone]

## 2018-07-22 DIAGNOSIS — F250 Schizoaffective disorder, bipolar type: Secondary | ICD-10-CM | POA: Diagnosis not present

## 2018-07-22 LAB — GLUCOSE, CAPILLARY: Glucose-Capillary: 156 mg/dL — ABNORMAL HIGH (ref 70–99)

## 2018-07-22 LAB — VALPROIC ACID LEVEL: Valproic Acid Lvl: 78 ug/mL (ref 50.0–100.0)
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
## 2018-07-22 NOTE — ED Notes (Signed)
Pt provided with meal tray and grape juice to drink, pt sitting up in bed at this time eating meal tray with no further needs or concerns

## 2018-07-22 NOTE — ED Provider Notes (Signed)
-----------------------------------------   6:24 AM on 07/22/2018 -----------------------------------------   Blood pressure 112/63, pulse 82, temperature 97.7 F (36.5 C), temperature source Oral, resp. rate 18, SpO2 99 %.  The patient is calm and cooperative at this time.  There have been no acute events since the last update.  Awaiting disposition plan from Behavioral Medicine team.   Paulette Blanch, MD 07/22/18 [phone]

## 2018-07-22 NOTE — ED Notes (Signed)
Patient is up from nap, ask for drink and crackers, nurse gave to her. Patient is calm and cooperative.

## 2018-07-22 NOTE — ED Notes (Signed)
Patient is sitting in dayroom, she comes to the door often and ask if she is going to get to leave or is she going to stay, nurse let her know that she would keep her updated with plan of care, she is calm and cooperative, denies Si/hi or avh at this time.

## 2018-07-22 NOTE — ED Notes (Signed)
Patient ate 100% of dinner and beverage. Patient is calm and cooperative, valproic acid level drawn.

## 2018-07-22 NOTE — ED Notes (Signed)
Pt. Currently sleeping in bed.

## 2018-07-23 DIAGNOSIS — F250 Schizoaffective disorder, bipolar type: Secondary | ICD-10-CM | POA: Diagnosis not present

## 2018-07-23 LAB — GLUCOSE, CAPILLARY
Glucose-Capillary: 130 mg/dL — ABNORMAL HIGH (ref 70–99)
Glucose-Capillary: 191 mg/dL — ABNORMAL HIGH (ref 70–99)

## 2018-07-23 LAB — TSH: TSH: 0.574 u[IU]/mL (ref 0.350–4.500)

## 2018-07-23 MED ORDER — HALOPERIDOL 0.5 MG PO TABS
2.0000 mg | ORAL_TABLET | Freq: Two times a day (BID) | ORAL | Status: DC
  Administered 2018-07-23 – 2018-07-28 (×11): 2 mg via ORAL
  Filled 2018-07-23 (×4): qty 4
  Filled 2018-07-23: qty 2
  Filled 2018-07-23 (×7): qty 4

## 2018-07-23 MED ORDER — DIPHENHYDRAMINE HCL 25 MG PO CAPS
25.0000 mg | ORAL_CAPSULE | Freq: Four times a day (QID) | ORAL | Status: DC | PRN
  Administered 2018-07-26: 11:00:00 25 mg via ORAL
  Filled 2018-07-23 (×3): qty 1

## 2018-07-23 MED ORDER — BENZTROPINE MESYLATE 1 MG PO TABS
0.5000 mg | ORAL_TABLET | Freq: Every day | ORAL | Status: DC
  Administered 2018-07-23 – 2018-07-28 (×6): 0.5 mg via ORAL
  Filled 2018-07-23 (×6): qty 1

## 2018-07-23 MED ORDER — LURASIDONE HCL 40 MG PO TABS
40.0000 mg | ORAL_TABLET | Freq: Every day | ORAL | Status: DC
  Administered 2018-07-24 – 2018-07-28 (×5): 40 mg via ORAL
  Filled 2018-07-23 (×5): qty 1

## 2018-07-23 MED ORDER — DIVALPROEX SODIUM 250 MG PO DR TAB
250.0000 mg | DELAYED_RELEASE_TABLET | Freq: Every day | ORAL | Status: DC
  Administered 2018-07-23 – 2018-08-03 (×10): 250 mg via ORAL
  Filled 2018-07-23 (×13): qty 1

## 2018-07-23 NOTE — ED Notes (Signed)
Pt currently taking shower at this time.

## 2018-07-23 NOTE — ED Provider Notes (Signed)
-----------------------------------------   1:23 AM on 07/23/2018 -----------------------------------------   Blood pressure (!) 133/56, pulse 78, temperature 98 F (36.7 C), temperature source Oral, resp. rate 20, SpO2 98 %.  The patient had no acute events since last update.  Calm and cooperative at this time.  Disposition is pending per Psychiatry/Behavioral Medicine team recommendations.     Alfred Levins, [place], MD 07/23/18 [phone]

## 2018-07-23 NOTE — ED Notes (Signed)
Pt was given a ginger ale. Pt was also given clean sheets to make her bed.

## 2018-07-23 NOTE — ED Notes (Signed)
Pt awake ambulating to restroom with no assistance from staff

## 2018-07-24 DIAGNOSIS — F250 Schizoaffective disorder, bipolar type: Secondary | ICD-10-CM | POA: Diagnosis not present

## 2018-07-24 LAB — GLUCOSE, CAPILLARY
Glucose-Capillary: 229 mg/dL — ABNORMAL HIGH (ref 70–99)
Glucose-Capillary: 200 mg/dL — ABNORMAL HIGH (ref 70–99)

## 2018-07-24 NOTE — ED Notes (Signed)
Pt given supplies to shower. Pt showered independently. New scrubs and socks given to pt to put on after shower. Pt expresses no further needs at this time.

## 2018-07-24 NOTE — ED Notes (Signed)
IVC pending placement 

## 2018-07-24 NOTE — ED Notes (Signed)
Pt given graham crackers and ginger ale at this time. Pt expresses no further needs. This RN will continue to monitor.

## 2018-07-24 NOTE — ED Provider Notes (Signed)
-----------------------------------------   5:52 AM on 07/24/2018 -----------------------------------------   Blood pressure 124/60, pulse 83, temperature 98 F (36.7 C), temperature source Oral, resp. rate 18, SpO2 98 %.  The patient is calm and cooperative at this time.  There have been no acute events since the last update.  Awaiting disposition plan from Social Work team.   Hinda Kehr, MD 07/24/18 [phone]

## 2018-07-24 NOTE — ED Notes (Signed)
Hourly rounding reveals patient sleeping in room. No complaints, stable, in no acute distress. Q15 minute rounds and monitoring via Security Cameras to continue. 

## 2018-07-24 NOTE — ED Notes (Signed)
Snack and beverage given. 

## 2018-07-24 NOTE — ED Notes (Signed)
Pt given meal tray and sprite to drink. Pt calm and cooperative at this time. Pt expresses no further needs.

## 2018-07-24 NOTE — ED Notes (Signed)
Report to include Situation, Background, Assessment, and Recommendations received from Annie RN. Patient alert and oriented, warm and dry, in no acute distress. Patient denies SI, HI, AVH and pain. Patient made aware of Q15 minute rounds and security cameras for their safety. Patient instructed to come to me with needs or concerns. ? ?

## 2018-07-24 NOTE — ED Notes (Signed)
Hourly rounding reveals patient in room. No complaints, stable, in no acute distress. Q15 minute rounds and monitoring via Security Cameras to continue. 

## 2018-07-25 DIAGNOSIS — F4329 Adjustment disorder with other symptoms: Secondary | ICD-10-CM | POA: Diagnosis not present

## 2018-07-25 LAB — GLUCOSE, CAPILLARY
Glucose-Capillary: 192 mg/dL — ABNORMAL HIGH (ref 70–99)
Glucose-Capillary: 198 mg/dL — ABNORMAL HIGH (ref 70–99)

## 2018-07-25 NOTE — ED Notes (Signed)
Hourly rounding reveals patient in room. No complaints, stable, in no acute distress. Q15 minute rounds and monitoring via Security Cameras to continue. 

## 2018-07-25 NOTE — ED Provider Notes (Signed)
-----------------------------------------   6:19 AM on 07/25/2018 -----------------------------------------   BP (!) 119/53 (BP Location: Right Arm)   Pulse 79   Temp 97.7 F (36.5 C) (Oral)   Resp 16   SpO2 98%   No acute events since last update.  Disposition is pending per Psychiatry/Behavioral Medicine team recommendations.      Nance Pear, MD 07/25/18 [phone]

## 2018-07-25 NOTE — ED Notes (Signed)
Hourly rounding reveals patient sleeping in room. No complaints, stable, in no acute distress. Q15 minute rounds and monitoring via Security Cameras to continue. 

## 2018-07-25 NOTE — ED Notes (Signed)
Pt sitting in dayroom, ambulates safely. In hospital provided and policy attire.
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
## 2018-07-25 NOTE — Social Work (Addendum)
TTS informed CSW that patient should be able to return to group home tomorrow (7/7), and that patient's medication have been changed.

## 2018-07-25 NOTE — ED Notes (Signed)
Pt constantly coming to nurses station door. Easily re-directed. Maintained on 15 minute checks and observation by security camera for safety.
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
## 2018-07-25 NOTE — ED Notes (Signed)
Speaking with Dr. Einar Grad via teelpsych. Maintained on 15 minute checks and observation by security camera for safety.

## 2018-07-25 NOTE — ED Notes (Signed)
Report to include Situation, Background, Assessment, and Recommendations received from Amy B. RN. Patient alert and oriented, warm and dry, in no acute distress. Patient denies SI, HI, AVH and pain. Patient made aware of Q15 minute rounds and security cameras for their safety. Patient instructed to come to me with needs or concerns. 

## 2018-07-26 DIAGNOSIS — F4329 Adjustment disorder with other symptoms: Secondary | ICD-10-CM | POA: Diagnosis not present

## 2018-07-26 LAB — GLUCOSE, CAPILLARY
Glucose-Capillary: 123 mg/dL — ABNORMAL HIGH (ref 70–99)
Glucose-Capillary: 153 mg/dL — ABNORMAL HIGH (ref 70–99)

## 2018-07-26 MED ORDER — HALOPERIDOL LACTATE 5 MG/ML IJ SOLN
INTRAMUSCULAR | Status: AC
  Filled 2018-07-26: qty 1

## 2018-07-26 MED ORDER — DIPHENHYDRAMINE HCL 25 MG PO CAPS
25.0000 mg | ORAL_CAPSULE | Freq: Once | ORAL | Status: AC
  Administered 2018-07-26: 17:00:00 25 mg via ORAL
  Filled 2018-07-26: qty 1

## 2018-07-26 MED ORDER — HALOPERIDOL 5 MG PO TABS
5.0000 mg | ORAL_TABLET | Freq: Once | ORAL | Status: AC
  Administered 2018-07-26: 17:00:00 5 mg via ORAL
  Filled 2018-07-26: qty 1

## 2018-07-26 MED ORDER — DIPHENHYDRAMINE HCL 50 MG/ML IJ SOLN
INTRAMUSCULAR | Status: AC
  Filled 2018-07-26: qty 1

## 2018-07-26 NOTE — ED Notes (Signed)
BEHAVIORAL HEALTH ROUNDING Patient sleeping: No. Patient alert and oriented: yes Behavior appropriate: Yes.  ; If no, describe:  Nutrition and fluids offered: yes Toileting and hygiene offered: Yes  Sitter present: q15 minute observations and security camera monitoring Law enforcement present: Yes  ODS  

## 2018-07-26 NOTE — ED Notes (Signed)
BEHAVIORAL HEALTH ROUNDING Patient sleeping: No. Patient alert and oriented: yes Behavior appropriate:   - she is agitated  .  ; If no, describe:  Nutrition and fluids offered: yes Toileting and hygiene offered: Yes  Sitter present: q15 minute observations and security camera monitoring Law enforcement present: Yes  ODS

## 2018-07-26 NOTE — ED Notes (Signed)
  PATIENT IN ROOM BANGING HER HEAD ON THE WINDOW AT THIS TIME.

## 2018-07-26 NOTE — ED Notes (Signed)
Hourly rounding reveals patient sleeping in room. No complaints, stable, in no acute distress. Q15 minute rounds and monitoring via Security Cameras to continue. 

## 2018-07-26 NOTE — ED Notes (Signed)
ED B[facility] Is the patient under IVC or is there intent for IVC: Yes.   Is the patient medically cleared: Yes.   Is there vacancy in the ED BHU: Yes.   Is the population mix appropriate for patient: Yes.   Is the patient awaiting placement in inpatient or outpatient setting: Yes.  Group home placement  Has the patient had a psychiatric consult: Yes.   Survey of unit performed for contraband, proper placement and condition of furniture, tampering with fixtures in bathroom, shower, and each patient room: Yes.  ; Findings:  APPEARANCE/BEHAVIOR Calm and cooperative NEURO ASSESSMENT Orientation: oriented x3  Denies pain Hallucinations: No.None noted (Hallucinations)  denies Speech: Normal Gait: normal RESPIRATORY ASSESSMENT Even  Unlabored respirations  CARDIOVASCULAR ASSESSMENT Pulses equal   regular rate  Skin warm and dry   GASTROINTESTINAL ASSESSMENT no GI complaint EXTREMITIES Full ROM  PLAN OF CARE Provide calm/safe environment. Vital signs assessed twice daily. ED BHU Assessment once each 12-hour shift.  Assure the ED provider has rounded once each shift. Provide and encourage hygiene. Provide redirection as needed. Assess for escalating behavior; address immediately and inform ED provider.  Assess family dynamic and appropriateness for visitation as needed: Yes.  ; If necessary, describe findings:  Educate the patient/family about BHU procedures/visitation: Yes.  ; If necessary, describe findings:

## 2018-07-26 NOTE — ED Notes (Signed)

## 2018-07-26 NOTE — ED Notes (Signed)
BEHAVIORAL HEALTH ROUNDING Patient sleeping: No. Patient alert and oriented: yes Behavior appropriate:  Pt acting out - kicking the door and walls - banging her head on the door and windows   - reassurance provided and encouragement to stop self injurious behaviors  .  ; If no, describe:  Nutrition and fluids offered: yes Toileting and hygiene offered: Yes  Sitter present: q15 minute observations and security camera monitoring Law enforcement present: Yes  ODS

## 2018-07-26 NOTE — ED Notes (Signed)
Pt beating head against glass window.

## 2018-07-26 NOTE — ED Notes (Addendum)
Patient in bathroom asking for her nurse , her nurse is with another patient at this time,offered to see what  Patient needed patient ask to come in bathroom and dress her , Patient upset with staff because  I  was encouraging to help her get dress patient states fuck you bitch  And shouts the door about three minutes later patient comes out fully dressed.

## 2018-07-26 NOTE — ED Notes (Signed)
Pt took mattress off bed and placed it on then drug the mattress to the hallway . Mattress was removed from the hallway and placed in the sally port. Pt threw pillows as well as blanket in the hall and stated "I don't want this shit." Pillows and blanket placed in sally port with mattress.

## 2018-07-26 NOTE — ED Notes (Signed)
BEHAVIORAL HEALTH ROUNDING Patient sleeping: Yes.   Patient alert and oriented: eyes closed  Appears asleep Behavior appropriate: Yes.  ; If no, describe:  Nutrition and fluids offered: Yes  Toileting and hygiene offered: sleeping Sitter present: q 15 minute observations and security camera monitoring Law enforcement present: yes  ODS 

## 2018-07-26 NOTE — ED Notes (Signed)
Hourly rounding reveals patient in room. No complaints, stable, in no acute distress. Q15 minute rounds and monitoring via Security Cameras to continue. 

## 2018-07-26 NOTE — ED Notes (Signed)
BEHAVIORAL HEALTH ROUNDING Patient sleeping: No. Patient alert and oriented: yes Behavior appropriate:  She is angry  Stating  "I am not taking no medicine - I want the psychiatrist to come and talk to me today - I am mad" .  ; If no, describe:  Nutrition and fluids offered: yes Toileting and hygiene offered: Yes  Sitter present: q15 minute observations and security camera monitoring Law enforcement present: Yes  ODS

## 2018-07-26 NOTE — ED Notes (Signed)
Pt given ginger ale.
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
## 2018-07-26 NOTE — ED Notes (Signed)
Pt continuing to punch door, [employer] Officer Jessica [name] out to speak with pt.

## 2018-07-26 NOTE — ED Provider Notes (Signed)
-----------------------------------------   7:29 AM on 07/26/2018 -----------------------------------------   Blood pressure 107/66, pulse 90, temperature 97.6 F (36.4 C), temperature source Oral, resp. rate 16, SpO2 100 %.  The patient is calm and cooperative at this time.  There have been no acute events since the last update.  Awaiting disposition plan from Behavioral Medicine and/or Social Work team(s).    Merlyn Lot, MD 07/26/18 [phone]

## 2018-07-26 NOTE — ED Notes (Signed)
Pt given meal tray and a soda.  

## 2018-07-26 NOTE — ED Notes (Signed)
Patient asked for her meal tray , meal tray was provided.

## 2018-07-26 NOTE — ED Notes (Signed)
Report to include Situation, Background, Assessment, and Recommendations received from Amy Teague RN. Patient alert and oriented, warm and dry, in no acute distress. Patient denies SI, HI, AVH and pain. Patient made aware of Q15 minute rounds and security cameras for their safety. Patient instructed to come to me with needs or concerns. 
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
## 2018-07-26 NOTE — ED Notes (Signed)
Report received from [facility]   Pt observed standing in the dayroom - waving at me  NAD observed  Continue to monitor

## 2018-07-27 LAB — GLUCOSE, CAPILLARY: Glucose-Capillary: 182 mg/dL — ABNORMAL HIGH (ref 70–99)
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
## 2018-07-27 NOTE — ED Notes (Signed)
IVC/Pending Placement by CSW

## 2018-07-27 NOTE — ED Notes (Signed)
Pt given lunch tray.

## 2018-07-27 NOTE — ED Notes (Signed)
Report received from [place]. Pt is observed in dayroom. She spoke about her group home owner, Ms. Watlington, coming to evaluate her tomorrow for possible return to the group home. She is pleasant and cooperative. Will continue to monitor for needs/safety.

## 2018-07-27 NOTE — ED Notes (Signed)
ED BHU PLACEMENT JUSTIFICATION Is the patient under IVC or is there intent for IVC: Yes.   Is the patient medically cleared: Yes.   Is there vacancy in the ED BHU: Yes.   Is the population mix appropriate for patient: Yes.   Is the patient awaiting placement in inpatient or outpatient setting: Yes.   Has the patient had a psychiatric consult: Yes.   Survey of unit performed for contraband, proper placement and condition of furniture, tampering with fixtures in bathroom, shower, and each patient room: Yes.  ; Findings:  APPEARANCE/BEHAVIOR Calm and cooperative NEURO ASSESSMENT Orientation: oriented x3  Denies pain Hallucinations: No.None noted (Hallucinations) denies Speech: Normal Gait: normal RESPIRATORY ASSESSMENT Even  Unlabored respirations  CARDIOVASCULAR ASSESSMENT Pulses equal   regular rate  Skin warm and dry   GASTROINTESTINAL ASSESSMENT no GI complaint EXTREMITIES Full ROM  PLAN OF CARE Provide calm/safe environment. Vital signs assessed twice daily. ED BHU Assessment once each 12-hour shift.  Assure the ED provider has rounded once each shift. Provide and encourage hygiene. Provide redirection as needed. Assess for escalating behavior; address immediately and inform ED provider.  Assess family dynamic and appropriateness for visitation as needed: Yes.  ; If necessary, describe findings:  Educate the patient/family about BHU procedures/visitation: Yes.  ; If necessary, describe findings:   

## 2018-07-27 NOTE — TOC Progression Note (Addendum)
Transition of Care [facility]) - Progression Note    Patient Details  Name: Jessica [name] MRN: [id number] Date of Birth: 1965-08-01  [facility] CM/SW Contact  Marshell Garfinkel, RN Phone Number: 07/27/2018, 10:37 AM  Clinical Narrative:    ED RNCM spoke with Joaquim Lai group home owner where patient is from [phone] to make arrangements to visit with patient.  Patient is requesting to speak with Joaquim Lai. Joaquim Lai agrees to visit with patient and asked that this RNCM call her tomorrow to make arrangements- agreed.    Expected Discharge Plan: Group Home Barriers to Discharge: Barriers Unresolved (comment)(patient behaviors- unacceptable verbal slurs)  Expected Discharge Plan and Services Expected Discharge Plan: Group Home                                               Social Determinants of Health (SDOH) Interventions    Readmission Risk Interventions No flowsheet data found.

## 2018-07-27 NOTE — ED Notes (Signed)
Hourly rounding reveals patient sleeping in room. No complaints, stable, in no acute distress. Q15 minute rounds and monitoring via Security Cameras to continue. 

## 2018-07-27 NOTE — ED Notes (Signed)
She is sitting in the dayroom in a chair  - verbalizing anger  "I am mad - I am ready for a new group home"  Pt reassured that care coordinators are looking for her a new group home  "I am still mad"  Reassurance provided

## 2018-07-27 NOTE — ED Notes (Signed)
BEHAVIORAL HEALTH ROUNDING Patient sleeping: No. Patient alert and oriented: yes Behavior appropriate: Yes.  ; If no, describe:  Nutrition and fluids offered: yes Toileting and hygiene offered: Yes  Sitter present: q15 minute observations and security camera monitoring Law enforcement present: Yes  ODS  

## 2018-07-27 NOTE — ED Notes (Signed)

## 2018-07-27 NOTE — ED Provider Notes (Signed)
-----------------------------------------   5:57 AM on 07/27/2018 -----------------------------------------   Blood pressure 107/60, pulse 79, temperature 98.1 F (36.7 C), temperature source Oral, resp. rate 18, SpO2 99 %.  The patient is sleeping at this time.  There have been no acute events since the last update.  Awaiting disposition plan from Behavioral Medicine and/or Social Work team(s).   Paulette Blanch, MD 07/27/18 [phone]

## 2018-07-27 NOTE — ED Provider Notes (Signed)
Patient noted to have a small vesicle under her right eyelid.  No overlying cellulitis.  Extraocular movements are intact.  Pupils reactive.  Denies any vision changes.  At this time is not consistent with cellulitis, herpes zoster, other acute pathology.  Plan to continue to watch it and do warm compresses.  If worsening the nurses will let us know.     Vanessa [place], MD 07/27/18 [phone]
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
## 2018-07-28 DIAGNOSIS — F250 Schizoaffective disorder, bipolar type: Secondary | ICD-10-CM | POA: Diagnosis not present

## 2018-07-28 LAB — GLUCOSE, CAPILLARY
Glucose-Capillary: 107 mg/dL — ABNORMAL HIGH (ref 70–99)
Glucose-Capillary: 139 mg/dL — ABNORMAL HIGH (ref 70–99)

## 2018-07-28 MED ORDER — DULOXETINE HCL 30 MG PO CPEP
30.0000 mg | ORAL_CAPSULE | Freq: Every day | ORAL | Status: DC
  Administered 2018-07-31: 30 mg via ORAL
  Filled 2018-07-28 (×3): qty 1

## 2018-07-28 MED ORDER — HALOPERIDOL 0.5 MG PO TABS
2.0000 mg | ORAL_TABLET | Freq: Once | ORAL | Status: AC
  Administered 2018-07-28: 19:00:00 2 mg via ORAL
  Filled 2018-07-28: qty 4

## 2018-07-28 MED ORDER — HALOPERIDOL 0.5 MG PO TABS
2.0000 mg | ORAL_TABLET | Freq: Three times a day (TID) | ORAL | Status: DC
  Administered 2018-07-28: 22:00:00 2 mg via ORAL
  Filled 2018-07-28: qty 4

## 2018-07-28 NOTE — ED Notes (Signed)
IVC/Pending Placement by CSW

## 2018-07-28 NOTE — ED Notes (Signed)
When pt was awakened for 0600 levothyroxine, pt appeared to pretend she was asleep; she would not take med.

## 2018-07-28 NOTE — ED Notes (Signed)
Pt was apologetic after medication admin. "I'll be better tomorrow." Encouragement provided. She said she did not want a fitted sheet or blanket on her bed. She had tossed these items on her floor and asked that they be removed.

## 2018-07-28 NOTE — ED Notes (Signed)
Pt has been labile since shift began. Initially apologetic, she has grown increasingly irritable when her demands are not met immediately. Prior to this shift, pt spilled a drink on floor and refused to clean it. Pt has been advised that environmental services cannot enter the room until pt is consistently calm/cooperative. Pt grows angry at that suggestion. "They'd better come clean it up!" Pt attributes the spill to Haldol, which she says has caused her tremors. Pt has no noticeable tremor when arms are outstretched but she displays a slight tremor when holding a cup with both hands. Pt has been advised multiple times of appropriate behavior, says, "I know that!" and immediately displays inappropriate behavior.

## 2018-07-28 NOTE — NC FL2 (Signed)
[place] LEVEL OF CARE SCREENING TOOL     IDENTIFICATION  Patient Name: Jessica [name] Birthdate: 07/01/1965 Sex: female Admission Date (Current Location): 07/20/2018  [place] and [place] Number:  Engineering geologist and Address:  [facility], [address] [place],  [address]      Provider Number: [id number]  Attending Physician Name and Address:  No att. providers found  Relative Name and Phone Number:  Pilar Plate   [phone]    Current Level of Care: Hospital Recommended Level of Care: Other (Comment)(Group Home) Prior Approval Number:    Date Approved/Denied:   PASRR Number:    Discharge Plan: Other (Comment)(Group Home)    Current Diagnoses: Patient Active Problem List   Diagnosis Date Noted  . Homicidal ideation 06/15/2018  . Schizoaffective disorder, bipolar type ([place]) 06/15/2018  . Homicidal thoughts 06/15/2018  . Borderline personality disorder ([place]) 06/05/2018  . Agitation 02/21/2018  . Mild intellectual disability 12/27/2017  . Diabetes mellitus without complication ([place]) 73/22/0254  . Atypical glandular cells of undetermined significance (AGUS) on cervical Pap smear 06/09/2016  . HTN (hypertension) 07/11/2013    Orientation RESPIRATION BLADDER Height & Weight     Self, Time, Situation, Place  Normal Continent Weight:   Height:     BEHAVIORAL SYMPTOMS/MOOD NEUROLOGICAL BOWEL NUTRITION STATUS  Verbally abusive   Continent Diet  AMBULATORY STATUS COMMUNICATION OF NEEDS Skin   Independent Verbally Normal                       Personal Care Assistance Level of Assistance  Bathing, Feeding, Dressing Bathing Assistance: Independent Feeding assistance: Independent Dressing Assistance: Independent     Functional Limitations Info  Sight, Hearing, Speech Sight Info: Adequate Hearing Info: Adequate Speech Info: Adequate    SPECIAL CARE FACTORS FREQUENCY                        Contractures Contractures Info: Not present    Additional Factors Info  Code Status Code Status Info: FULL             Current Medications (07/28/2018):  This is the current hospital active medication list Current Facility-Administered Medications  Medication Dose Route Frequency Provider Last Rate Last Dose  . atorvastatin (LIPITOR) tablet 10 mg  10 mg Oral Daily Lamont Dowdy, NP   10 mg at 07/28/18 0923  . benztropine (COGENTIN) tablet 0.5 mg  0.5 mg Oral Daily Patrecia Pour, NP   0.5 mg at 07/28/18 2706  . carvedilol (COREG) tablet 6.25 mg  6.25 mg Oral BID Lamont Dowdy, NP   6.25 mg at 07/28/18 0925  . diphenhydrAMINE (BENADRYL) capsule 25 mg  25 mg Oral Q6H PRN Patrecia Pour, NP   25 mg at 07/26/18 1126  . divalproex (DEPAKOTE) DR tablet 250 mg  250 mg Oral Q1400 Patrecia Pour, NP   250 mg at 07/27/18 1809  . divalproex (DEPAKOTE) DR tablet 500 mg  500 mg Oral Q12H Patrecia Pour, NP   500 mg at 07/28/18 2376  . DULoxetine (CYMBALTA) DR capsule 60 mg  60 mg Oral Daily Lamont Dowdy, NP   60 mg at 07/28/18 0923  . ferrous sulfate tablet 325 mg  325 mg Oral BID Lamont Dowdy, NP   325 mg at 07/28/18 0925  . gabapentin (NEURONTIN) capsule 400 mg  400 mg Oral TID Einar Grad, Himabindu, MD   400 mg at  07/28/18 0923  . haloperidol (HALDOL) tablet 2 mg  2 mg Oral BID Patrecia Pour, NP   2 mg at 07/28/18 6578  . insulin detemir (LEVEMIR) injection 10 Units  10 Units Subcutaneous BID Lamont Dowdy, NP   10 Units at 07/28/18 0924  . lamoTRIgine (LAMICTAL) tablet 150 mg  150 mg Oral QHS Lamont Dowdy, NP   150 mg at 07/27/18 2122  . levothyroxine (SYNTHROID) tablet 150 mcg  150 mcg Oral Q0600 Lamont Dowdy, NP   150 mcg at 07/28/18 4696  . lisinopril (ZESTRIL) tablet 10 mg  10 mg Oral Daily Lamont Dowdy, NP   10 mg at 07/28/18 0923  . loratadine (CLARITIN) tablet 10 mg  10 mg Oral Daily Lamont Dowdy, NP   10 mg at  07/28/18 0923  . lurasidone (LATUDA) tablet 40 mg  40 mg Oral Daily Patrecia Pour, NP   40 mg at 07/28/18 2952  . metFORMIN (GLUCOPHAGE) tablet 500 mg  500 mg Oral BID WC Lamont Dowdy, NP   500 mg at 07/28/18 0818  . pantoprazole (PROTONIX) EC tablet 40 mg  40 mg Oral Daily Lamont Dowdy, NP   40 mg at 07/28/18 0924  . traZODone (DESYREL) tablet 100 mg  100 mg Oral QHS Lamont Dowdy, NP   100 mg at 07/27/18 2122   Current Outpatient Medications  Medication Sig Dispense Refill  . atorvastatin (LIPITOR) 10 MG tablet Take 1 tablet (10 mg total) by mouth daily. 30 tablet 1  . carvedilol (COREG) 6.25 MG tablet Take 1 tablet (6.25 mg total) by mouth 2 (two) times daily. 60 tablet 1  . divalproex (DEPAKOTE) 500 MG DR tablet Take 1 tablet (500 mg total) by mouth every 12 (twelve) hours. 60 tablet 1  . DULoxetine (CYMBALTA) 60 MG capsule Take 1 capsule (60 mg total) by mouth daily. 30 capsule 1  . ferrous sulfate (FEROSUL) 325 (65 FE) MG tablet Take 1 tablet (325 mg total) by mouth 2 (two) times daily. 60 tablet 1  . fluPHENAZine (PROLIXIN) 5 MG tablet Take 5 mg by mouth 4 (four) times daily.    Marland Kitchen gabapentin (NEURONTIN) 600 MG tablet Take 1 tablet (600 mg total) by mouth 3 (three) times daily. 90 tablet 1  . glipiZIDE (GLUCOTROL XL) 5 MG 24 hr tablet Take 5 mg by mouth daily.    . insulin detemir (LEVEMIR) 100 UNIT/ML injection Inject 0.1 mLs (10 Units total) into the skin 2 (two) times daily. (Patient not taking: Reported on 07/21/2018) 10 mL 11  . lamoTRIgine (LAMICTAL) 150 MG tablet Take 1 tablet (150 mg total) by mouth at bedtime. 30 tablet 1  . levothyroxine (SYNTHROID) 150 MCG tablet Take 1 tablet (150 mcg total) by mouth daily at 6 (six) AM. 30 tablet 1  . lisinopril (ZESTRIL) 10 MG tablet Take 1 tablet (10 mg total) by mouth daily. 30 tablet 1  . loratadine (CLARITIN) 10 MG tablet Take 1 tablet (10 mg total) by mouth daily. 30 tablet 1  . lurasidone (LATUDA) 40 MG TABS  tablet Take 3 tablets (120 mg total) by mouth daily. 90 tablet 1  . metFORMIN (GLUCOPHAGE) 500 MG tablet Take 1 tablet (500 mg total) by mouth 2 (two) times daily. 60 tablet 1  . pantoprazole (PROTONIX) 40 MG tablet Take 1 tablet (40 mg total) by mouth daily. 30 tablet 1  . traZODone (DESYREL) 100 MG tablet Take 1 tablet (100 mg total) by mouth at bedtime. 30 tablet 1  Discharge Medications: Please see discharge summary for a list of discharge medications.  Relevant Imaging Results:  Relevant Lab Results:   Additional Information SSN:   [id number]  Tania Peggi Yono, LCSW

## 2018-07-28 NOTE — ED Notes (Signed)
Pt came to the window to tell writer that she can't sleep. "I can't sleep." She then said, "I don't want to sleep." Pt was noted to be slightly unsteady and drowsy. Pt was guided back to bed. Will continue to monitor for needs/safety.

## 2018-07-28 NOTE — ED Notes (Signed)
Pt given a meal tray 

## 2018-07-28 NOTE — ED Notes (Signed)
Pt given meal tray.
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
## 2018-07-28 NOTE — ED Notes (Addendum)
[name] , sister [phone] called to speak with pt but pt is sleeping. Will give her message when she wakes

## 2018-07-28 NOTE — ED Provider Notes (Signed)
-----------------------------------------   4:55 AM on 07/28/2018 -----------------------------------------   Blood pressure (!) 102/52, pulse 86, temperature 97.8 F (36.6 C), temperature source Oral, resp. rate 18, SpO2 96 %.  The patient is calm and cooperative at this time.  There have been no acute events since the last update.  Awaiting disposition plan from Behavioral Medicine and/or Social Work team(s).    Harvest Dark, MD 07/28/18 [phone]

## 2018-07-28 NOTE — ED Notes (Signed)
Patient banging on windows and asking to leave the unit she was moved too
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
## 2018-07-28 NOTE — ED Notes (Signed)
Pt refusing shower at this time, given fresh scrubs and underwear to change. Pt linen changed as well.
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
## 2018-07-28 NOTE — ED Notes (Addendum)
Pt threw her lunch meal tray at door, pt asked to pick up food, PT is picking up food at this time while cursing this RN

## 2018-07-28 NOTE — ED Notes (Addendum)
Pt threw drink at door, pt asked to pick up and pt refuses. States " I'm tired of yall" . Water cleaned off of floor by [place], NT

## 2018-07-29 DIAGNOSIS — F250 Schizoaffective disorder, bipolar type: Secondary | ICD-10-CM | POA: Diagnosis not present

## 2018-07-29 MED ORDER — DIPHENHYDRAMINE HCL 25 MG PO CAPS
50.0000 mg | ORAL_CAPSULE | Freq: Four times a day (QID) | ORAL | Status: DC | PRN
  Administered 2018-07-29: 50 mg via ORAL

## 2018-07-29 MED ORDER — PALIPERIDONE ER 3 MG PO TB24: 6.0000 mg | ORAL_TABLET | Freq: Every day | ORAL | Status: DC

## 2018-07-29 MED ORDER — HALOPERIDOL LACTATE 5 MG/ML IJ SOLN
5.0000 mg | Freq: Four times a day (QID) | INTRAMUSCULAR | Status: DC | PRN
  Administered 2018-07-29: 09:00:00 5 mg via INTRAMUSCULAR
  Filled 2018-07-29: qty 1

## 2018-07-29 MED ORDER — DIPHENHYDRAMINE HCL 50 MG/ML IJ SOLN
25.0000 mg | Freq: Once | INTRAMUSCULAR | Status: AC
  Administered 2018-07-29: 21:00:00 25 mg via INTRAMUSCULAR
  Filled 2018-07-29: qty 1

## 2018-07-29 MED ORDER — OLANZAPINE 10 MG PO TABS: 10.0000 mg | ORAL_TABLET | Freq: Once | ORAL | Status: AC | PRN

## 2018-07-29 MED ORDER — BENZTROPINE MESYLATE 1 MG PO TABS: 1.0000 mg | ORAL_TABLET | Freq: Every day | ORAL | Status: DC

## 2018-07-29 MED ORDER — CHLORPROMAZINE HCL 25 MG/ML IJ SOLN
50.0000 mg | Freq: Once | INTRAMUSCULAR | Status: AC
  Administered 2018-07-29: 15:00:00 50 mg via INTRAMUSCULAR
  Filled 2018-07-29: qty 2

## 2018-07-29 MED ORDER — OLANZAPINE 10 MG IM SOLR
10.0000 mg | Freq: Once | INTRAMUSCULAR | Status: AC | PRN
  Administered 2018-07-29: 09:00:00 10 mg via INTRAMUSCULAR
  Filled 2018-07-29: qty 10

## 2018-07-29 MED ORDER — LORAZEPAM 2 MG/ML IJ SOLN
2.0000 mg | Freq: Once | INTRAMUSCULAR | Status: AC
  Administered 2018-07-29: 2 mg via INTRAMUSCULAR
  Filled 2018-07-29: qty 1

## 2018-07-29 MED ORDER — LORAZEPAM 2 MG/ML IJ SOLN
2.0000 mg | Freq: Once | INTRAMUSCULAR | Status: AC
  Administered 2018-07-29: 15:00:00 2 mg via INTRAMUSCULAR
  Filled 2018-07-29: qty 1

## 2018-07-29 MED ORDER — LAMOTRIGINE 100 MG PO TABS: 200.0000 mg | ORAL_TABLET | Freq: Every day | ORAL | Status: DC

## 2018-07-29 NOTE — ED Notes (Signed)
Pt. Calling out from room, pt. Given sedation medications early,  Pt. Tired.  Patients mattress placed on floor, pt. Made comfortable.

## 2018-07-29 NOTE — ED Notes (Signed)
Shower offered and encouraged  pts eyes look reddened  She refused her shower at this time

## 2018-07-29 NOTE — ED Notes (Signed)
ED BHU [place] Is the patient under IVC or is there intent for IVC: Yes.   Is the patient medically cleared: Yes.   Is there vacancy in the ED BHU: Yes.   Is the population mix appropriate for patient: Yes.   Is the patient awaiting placement in inpatient or outpatient setting: Yes.  Awaiting group home placement  Has the patient had a psychiatric consult: Yes.   Survey of unit performed for contraband, proper placement and condition of furniture, tampering with fixtures in bathroom, shower, and each patient room: Yes.  ; Findings:  APPEARANCE/BEHAVIOR Anxious  Angry yet cooperative NEURO ASSESSMENT Orientation: oriented x3  Denies pain Hallucinations: No.None noted (Hallucinations)  Denies  Speech: Normal Gait: normal RESPIRATORY ASSESSMENT Even  Unlabored respirations  CARDIOVASCULAR ASSESSMENT Pulses equal   regular rate  Skin warm and dry   GASTROINTESTINAL ASSESSMENT no GI complaint EXTREMITIES Full ROM  PLAN OF CARE Provide calm/safe environment. Vital signs assessed twice daily. ED BHU Assessment once each 12-hour shift. Assure the ED provider has rounded once each shift. Provide and encourage hygiene. Provide redirection as needed. Assess for escalating behavior; address immediately and inform ED provider.  Assess family dynamic and appropriateness for visitation as needed: Yes.  ; If necessary, describe findings:  Educate the patient/family about BHU procedures/visitation: Yes.  ; If necessary, describe findings:

## 2018-07-29 NOTE — ED Notes (Signed)
Pt came to window yelling that she wants her medicine. This RN had already wasted the original dose that was refused, so another dose was removed from pyxis. By the time this RN got to pt's room with new meds and fresh water pt states she no longer wanted medication and continued to cuss at this Probation officer.

## 2018-07-29 NOTE — ED Notes (Signed)
She has not calmed during this shift - she refused all of her am meds  - eyes continue to be swollen even after benadryl administration   Pt angry stating  "I hate you - bastard  MF"  Then banging on the windows and doors  Screaming this nurse's name   I enter to see if she is okay and she curses me to leave
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
## 2018-07-29 NOTE — ED Notes (Signed)
IVC/Pending Placement by CSW

## 2018-07-29 NOTE — ED Notes (Signed)
Pt rolled off bed onto concrete floor, this Probation officer and [place] in pt room at this time, pt second mattress removed from pt floor and primary mattress placed on the floor to prevent pt from rolling off of bed. This Probation officer and Airline pilot placed pt back on mattress and pt is now resting quietly, will continue to monitor by security cameras and q 15 minute rounds

## 2018-07-29 NOTE — ED Notes (Signed)
Pt given meal tray.

## 2018-07-29 NOTE — ED Notes (Signed)

## 2018-07-29 NOTE — ED Notes (Signed)
Pt can be heard by staff in unit screaming "I want out" pt rolling around on bed and rolling off of bed, mattress placed on floor for pt safety prior to this Probation officer and RN Matt shift

## 2018-07-29 NOTE — ED Notes (Signed)
Pt. Yelled out.  This nurse and tech went in to check on patient.  Pt. Required assistance with scrubs, pt. Given assistance.   Pt. Requesting to go home, pt. Reminded that it was late in the evening and she would be spending the night in hospital.  Pt. Adjusted in bed to be more comfortable.

## 2018-07-29 NOTE — ED Notes (Signed)
Pt. Requested and was given water.  Pt. agrees to stay calm and willing to take levimer injection tonight.

## 2018-07-29 NOTE — ED Notes (Signed)
VSS assessment completed  - pt crying out loud  - lying in the floor between the bed and the wall prior to assessment  Pt stating  "I want to go home - I miss my momma  - nobody care about me  - I am dizzy - that haldol has made me dizzy"  Reassurance provided   Discussed grief and losing a parent  Pt sobbing   Plan of care discussed including pending group home placement
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
## 2018-07-29 NOTE — ED Notes (Signed)
PT can be heard yelling that her hands shake and she states no one believes her

## 2018-07-29 NOTE — ED Notes (Signed)
BEHAVIORAL HEALTH ROUNDING Patient sleeping: Yes.   Patient alert and oriented: eyes closed  Appears asleep Behavior appropriate: Yes.  ; If no, describe:  Nutrition and fluids offered: Yes  Toileting and hygiene offered: sleeping Sitter present: q 15 minute observations and security camera monitoring Law enforcement present: yes  ODS 

## 2018-07-29 NOTE — ED Notes (Signed)
BEHAVIORAL HEALTH ROUNDING Patient sleeping: No. Patient alert and oriented: yes Behavior appropriate: Yes.  ; If no, describe:  Nutrition and fluids offered: yes Toileting and hygiene offered: Yes  Sitter present: q15 minute observations and security camera monitoring Law enforcement present: Yes  ODS  

## 2018-07-29 NOTE — ED Notes (Signed)
She can be heard banging on the wall - yelling  "Nobody cares about me"  Walking in the hallway and in and out of rooms 6, 7, 8  I let her know I would be her nurse today and she shakes in head - okay

## 2018-07-29 NOTE — ED Notes (Signed)
Pt resting at this time. Respirations even and unlabored besides the occasional snore. Will continue to monitor.

## 2018-07-29 NOTE — ED Notes (Signed)
Pt was given breakfast tray. Pt threw her cup of water and meal tray against her door. This EDT and RN Amy T cleaned up the food and water. RN Amy T mopped and dried  pt's floor.

## 2018-07-29 NOTE — ED Notes (Signed)
pts sister has called - update provided  Pt talked for a few minutes

## 2018-07-29 NOTE — ED Notes (Signed)
She has calmed from yelling

## 2018-07-29 NOTE — ED Notes (Signed)
Pt placed back on her mattress by this Probation officer and RN [facility]

## 2018-07-29 NOTE — ED Notes (Signed)
BEHAVIORAL HEALTH ROUNDING Patient sleeping: No. Patient alert and oriented: yes Behavior appropriate:  angry.  ; If no, describe:  Nutrition and fluids offered: yes Toileting and hygiene offered: Yes  Sitter present: q15 minute observations and security camera monitoring Law enforcement present: Yes  ODS
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
## 2018-07-29 NOTE — ED Notes (Signed)
BEHAVIORAL HEALTH ROUNDING Patient sleeping: No. Patient alert and oriented: yes Behavior appropriate:  Lying in bed with her eyes closed - not sleeping.  ; If no, describe:  Nutrition and fluids offered: yes Toileting and hygiene offered: Yes  Sitter present: q15 minute observations and security camera monitoring Law enforcement present: Yes  ODS

## 2018-07-30 ENCOUNTER — Emergency Department: Payer: Medicare Other

## 2018-07-30 ENCOUNTER — Encounter: Payer: Self-pay | Admitting: Radiology

## 2018-07-30 DIAGNOSIS — F250 Schizoaffective disorder, bipolar type: Secondary | ICD-10-CM | POA: Diagnosis not present

## 2018-07-30 LAB — AMMONIA: Ammonia: 34 umol/L (ref 9–35)

## 2018-07-30 LAB — URINALYSIS, COMPLETE (UACMP) WITH MICROSCOPIC
Bacteria, UA: NONE SEEN
Bilirubin Urine: NEGATIVE
Glucose, UA: NEGATIVE mg/dL
Hgb urine dipstick: NEGATIVE
Ketones, ur: 5 mg/dL — AB
Leukocytes,Ua: NEGATIVE
Nitrite: NEGATIVE
Protein, ur: 30 mg/dL — AB
Specific Gravity, Urine: 1.033 — ABNORMAL HIGH (ref 1.005–1.030)
pH: 7 (ref 5.0–8.0)

## 2018-07-30 LAB — CBC WITH DIFFERENTIAL/PLATELET
Abs Immature Granulocytes: 0.05 10*3/uL (ref 0.00–0.07)
Basophils Absolute: 0 10*3/uL (ref 0.0–0.1)
Basophils Relative: 0 %
Eosinophils Absolute: 0 10*3/uL (ref 0.0–0.5)
Eosinophils Relative: 0 %
HCT: 40.8 % (ref 36.0–46.0)
Hemoglobin: 13.3 g/dL (ref 12.0–15.0)
Immature Granulocytes: 1 %
Lymphocytes Relative: 20 %
Lymphs Abs: 1.3 10*3/uL (ref 0.7–4.0)
MCH: 30.9 pg (ref 26.0–34.0)
MCHC: 32.6 g/dL (ref 30.0–36.0)
MCV: 94.7 fL (ref 80.0–100.0)
Monocytes Absolute: 0.8 10*3/uL (ref 0.1–1.0)
Monocytes Relative: 12 %
Neutro Abs: 4.2 10*3/uL (ref 1.7–7.7)
Neutrophils Relative %: 67 %
Platelets: 56 10*3/uL — ABNORMAL LOW (ref 150–400)
RBC: 4.31 MIL/uL (ref 3.87–5.11)
RDW: 13 % (ref 11.5–15.5)
WBC: 6.3 10*3/uL (ref 4.0–10.5)
nRBC: 0 % (ref 0.0–0.2)

## 2018-07-30 LAB — COMPREHENSIVE METABOLIC PANEL
ALT: 19 U/L (ref 0–44)
AST: 25 U/L (ref 15–41)
Albumin: 4.3 g/dL (ref 3.5–5.0)
Alkaline Phosphatase: 71 U/L (ref 38–126)
Anion gap: 12 (ref 5–15)
BUN: 24 mg/dL — ABNORMAL HIGH (ref 6–20)
CO2: 31 mmol/L (ref 22–32)
Calcium: 8.4 mg/dL — ABNORMAL LOW (ref 8.9–10.3)
Chloride: 95 mmol/L — ABNORMAL LOW (ref 98–111)
Creatinine, Ser: 0.76 mg/dL (ref 0.44–1.00)
GFR calc Af Amer: >60 mL/min (ref >60)
GFR calc non Af Amer: >60 mL/min (ref >60)
Glucose, Bld: 164 mg/dL — ABNORMAL HIGH (ref 70–99)
Potassium: 4.9 mmol/L (ref 3.5–5.1)
Sodium: 138 mmol/L (ref 135–145)
Total Bilirubin: 0.8 mg/dL (ref 0.3–1.2)
Total Protein: 8.2 g/dL — ABNORMAL HIGH (ref 6.5–8.1)

## 2018-07-30 LAB — BLOOD GAS, VENOUS
Acid-Base Excess: 9.9 mmol/L — ABNORMAL HIGH (ref 0.0–2.0)
Bicarbonate: 36 mmol/L — ABNORMAL HIGH (ref 20.0–28.0)
O2 Saturation: 74.6 %
Patient temperature: 37
pCO2, Ven: 53 mmHg (ref 44.0–60.0)
pH, Ven: 7.44 — ABNORMAL HIGH (ref 7.250–7.430)
pO2, Ven: 38 mmHg (ref 32.0–45.0)

## 2018-07-30 LAB — GLUCOSE, CAPILLARY
Glucose-Capillary: 162 mg/dL — ABNORMAL HIGH (ref 70–99)
Glucose-Capillary: 145 mg/dL — ABNORMAL HIGH (ref 70–99)

## 2018-07-30 LAB — VALPROIC ACID LEVEL: Valproic Acid Lvl: 39 ug/mL — ABNORMAL LOW (ref 50.0–100.0)

## 2018-07-30 LAB — TSH: TSH: 0.355 u[IU]/mL (ref 0.350–4.500)

## 2018-07-30 MED ORDER — IOHEXOL 300 MG/ML  SOLN
75.0000 mL | Freq: Once | INTRAMUSCULAR | Status: AC | PRN
  Administered 2018-07-30: 13:00:00 75 mL via INTRAVENOUS

## 2018-07-30 MED ORDER — CEPHALEXIN 500 MG PO CAPS
500.0000 mg | ORAL_CAPSULE | Freq: Four times a day (QID) | ORAL | Status: DC
  Administered 2018-07-30 – 2018-07-31 (×5): 500 mg via ORAL
  Filled 2018-07-30 (×5): qty 1

## 2018-07-30 MED ORDER — GABAPENTIN 100 MG PO CAPS
200.0000 mg | ORAL_CAPSULE | Freq: Three times a day (TID) | ORAL | Status: DC
  Administered 2018-07-30 – 2018-08-04 (×13): 200 mg via ORAL
  Filled 2018-07-30 (×15): qty 2

## 2018-07-30 MED ORDER — CLINDAMYCIN PHOSPHATE 900 MG/50ML IV SOLN
900.0000 mg | Freq: Once | INTRAVENOUS | Status: AC
  Administered 2018-07-30: 11:00:00 900 mg via INTRAVENOUS
  Filled 2018-07-30 (×2): qty 50

## 2018-07-30 MED ORDER — DIPHENHYDRAMINE HCL 25 MG PO CAPS: 25.0000 mg | ORAL_CAPSULE | Freq: Four times a day (QID) | ORAL | Status: DC | PRN

## 2018-07-30 MED ORDER — BENZTROPINE MESYLATE 0.5 MG PO TABS
0.5000 mg | ORAL_TABLET | Freq: Two times a day (BID) | ORAL | Status: DC | PRN
  Filled 2018-07-30: qty 1

## 2018-07-30 MED ORDER — LAMOTRIGINE 100 MG PO TABS
100.0000 mg | ORAL_TABLET | Freq: Every day | ORAL | Status: DC
  Administered 2018-07-30 – 2018-07-31 (×2): 100 mg via ORAL
  Filled 2018-07-30 (×2): qty 1

## 2018-07-30 MED ORDER — SULFAMETHOXAZOLE-TRIMETHOPRIM 800-160 MG PO TABS
1.0000 | ORAL_TABLET | Freq: Two times a day (BID) | ORAL | Status: DC
  Administered 2018-07-30 – 2018-07-31 (×3): 1 via ORAL
  Filled 2018-07-30 (×3): qty 1

## 2018-07-30 MED ORDER — SODIUM CHLORIDE 0.9 % IV SOLN
2.0000 g | Freq: Once | INTRAVENOUS | Status: AC
  Administered 2018-07-30: 15:00:00 2 g via INTRAVENOUS
  Filled 2018-07-30: qty 20

## 2018-07-30 MED ORDER — LORAZEPAM 1 MG PO TABS
1.0000 mg | ORAL_TABLET | Freq: Three times a day (TID) | ORAL | Status: AC
  Administered 2018-07-30 – 2018-07-31 (×5): 1 mg via ORAL
  Filled 2018-07-30 (×5): qty 1

## 2018-07-30 MED ORDER — PALIPERIDONE ER 3 MG PO TB24
3.0000 mg | ORAL_TABLET | Freq: Every day | ORAL | Status: DC
  Administered 2018-07-31 – 2018-08-04 (×4): 3 mg via ORAL
  Filled 2018-07-30 (×5): qty 1

## 2018-07-30 MED ORDER — SODIUM CHLORIDE 0.9 % IV BOLUS
1000.0000 mL | Freq: Once | INTRAVENOUS | Status: AC
  Administered 2018-07-30: 11:00:00 1000 mL via INTRAVENOUS

## 2018-07-30 MED ORDER — LORAZEPAM 2 MG/ML IJ SOLN
1.0000 mg | Freq: Once | INTRAMUSCULAR | Status: AC
  Administered 2018-07-30: 12:00:00 1 mg via INTRAVENOUS

## 2018-07-30 MED ORDER — HALOPERIDOL LACTATE 5 MG/ML IJ SOLN
10.0000 mg | Freq: Once | INTRAMUSCULAR | Status: AC
  Administered 2018-07-30: 10 mg via INTRAMUSCULAR
  Filled 2018-07-30: qty 2

## 2018-07-30 MED ORDER — MOXIFLOXACIN HCL 0.5 % OP SOLN
1.0000 [drp] | Freq: Three times a day (TID) | OPHTHALMIC | Status: DC
  Administered 2018-07-30 – 2018-07-31 (×3): 1 [drp] via OPHTHALMIC
  Filled 2018-07-30 (×2): qty 3

## 2018-07-30 MED ORDER — LORAZEPAM 2 MG/ML IJ SOLN
INTRAMUSCULAR | Status: AC
  Filled 2018-07-30: qty 1

## 2018-07-30 NOTE — ED Notes (Signed)
Pt needing assistance walking. Pt is confused and disoriented. Nurse tech had to help patient eat breakfast. EDP and NP made aware. Maintained on 15 minute checks and observation by security camera for safety.

## 2018-07-30 NOTE — ED Notes (Signed)
Pt unsteady on feet. Per Theadora Rama (charge, RN) fall mat okay to be placed by pts bedside. Will continue to monitor.

## 2018-07-30 NOTE — ED Provider Notes (Signed)
-----------------------------------------   5:41 AM on 07/30/2018 -----------------------------------------   Blood pressure (!) 154/73, pulse 79, temperature 98.3 F (36.8 C), temperature source Oral, resp. rate 16, SpO2 98 %.  The patient is calm and cooperative at this time.  There have been no acute events since the last update.  Awaiting disposition plan from Behavioral Medicine and/or Social Work team(s).   Hinda Kehr, MD 07/30/18 [phone]

## 2018-07-30 NOTE — ED Notes (Signed)
Pt. Off her mattress on the floor.  Pt. Requesting to go home.  Pt. Refused oral synthroid medication.

## 2018-07-30 NOTE — ED Notes (Signed)
Pt. Yelling out for assistance.  This nurse and nurse tech, moved patient back on mattress that is on ground.  Pt. Made comfortable on bed with blankets.  Pt. States, "Thank you" as nurse left room.

## 2018-07-30 NOTE — ED Notes (Signed)
Called pt's sister [name] and pt able to talk to her

## 2018-07-30 NOTE — ED Notes (Signed)
Pt laying on floor, screaming.  Maintained on 15 minute checks and observation by security camera for safety.

## 2018-07-30 NOTE — ED Notes (Signed)
Pt. Yelling out from room wanting assistance to get on bed.  Pt. Assisted back onto bed.
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
## 2018-07-30 NOTE — ED Notes (Signed)
Patient will be be sent back to the quad for further treatment and evaluation. Repot given to [employer], Therapist, sports.

## 2018-07-30 NOTE — ED Notes (Signed)
Pt. Yelling out from room, "I want to go home".  Pt. Reminded that she is in hospital and she will be spending the night.  Pt. Adjusted on bed to be more comfortable.

## 2018-07-30 NOTE — ED Notes (Signed)
Pt given meal tray and became very upset reporting that she did not want her food at this time. Pt then proceeded to try to crawl out of bed without success. Pt continues to be very drowsy and was easy to redirect and calm down.   PT resting in bed but meal tray is placed beside until pt requests food.

## 2018-07-30 NOTE — Plan of Care (Signed)
Patient seen and examined she is alert she is sluggish she is oriented to general situation with help which oriented to name, she does ramble somewhat incoherently, reports past but no current auditory hallucinations.  No evidence of stiffness or EPS, no evidence of neuroleptic malignant syndrome.  Presentation seems more deliriform than exacerbation and underlying psychosis/schizoaffective condition We will adjust meds to use lorazepam, hydrate patient, avoid anticholinergics, and reduce lamotrigine.

## 2018-07-30 NOTE — ED Notes (Signed)
Pt yelling out being verbally aggressive. Dr Jimmye Norman informed and will place orders.

## 2018-07-30 NOTE — ED Notes (Signed)
IVC/Pending Placement by CSW

## 2018-07-30 NOTE — ED Provider Notes (Signed)
-----------------------------------------   8:17 AM on 07/30/2018 -----------------------------------------  I was asked to evaluate the patient in the [facility] due to eye redness and altered mental status.  Of note, altered mental status seems to have progressively worsened over the last 12 hours or so.  She was given Thorazine as well as a dose of Benadryl yesterday, and had a reported paradoxical reaction to Benadryl.  On exam, the patient appears drowsy, slightly ataxic, with slurred speech.  She has no nystagmus or evidence of focal neurological deficit.  Exam is more consistent with delirium, likely secondary to antihistamine and antipsychotic use, there will check a CT scan to evaluate for underlying stroke that is significantly less likely.  Regarding her eye redness, on exam, she has evidence of likely acute dacryocystitis.  I am able to express a small amount of semi-purulent discharge from the lacrimal gland.  She has no evidence of periorbital edema, or pain with extraocular movements, photophobia, proptosis, or signs of orbital cellulitis.  Will follow-up CT imaging.  Will start on antibiotics as well as strong antibiotic drops.  Warm compresses ordered.  ----------------------------------------- 12:58 PM on 07/30/2018 -----------------------------------------  Case discussed w/ psychiatry. I suspect her AMS is from delirium 2/2 medications, being in hospital. Will avoid anticholinergics. CT pending. Broad ABX given for her early dacrocystitis. Will plan to attempt outpt management w/ keflex/bactrim pending results.  ----------------------------------------- 2:48 PM on 07/30/2018 -----------------------------------------  CT scan shows uncomplicated, mild preseptal cellulitis.  No other acute antibiotic.  The patient is increasingly awake.  Will place on Keflex/Bactrim for management of preseptal cellulitis, and continue her eyedrops.  She does not need admission from this standpoint with normal  white count, no fever, no evidence of complication.  Will continue psychiatry management.     Duffy Bruce, MD 07/30/18 [phone]

## 2018-07-31 ENCOUNTER — Encounter: Payer: Self-pay | Admitting: Radiology

## 2018-07-31 ENCOUNTER — Emergency Department: Payer: Medicare Other

## 2018-07-31 DIAGNOSIS — R0902 Hypoxemia: Secondary | ICD-10-CM | POA: Diagnosis present

## 2018-07-31 DIAGNOSIS — D696 Thrombocytopenia, unspecified: Secondary | ICD-10-CM | POA: Diagnosis not present

## 2018-07-31 DIAGNOSIS — K219 Gastro-esophageal reflux disease without esophagitis: Secondary | ICD-10-CM | POA: Diagnosis present

## 2018-07-31 DIAGNOSIS — Z20828 Contact with and (suspected) exposure to other viral communicable diseases: Secondary | ICD-10-CM | POA: Diagnosis present

## 2018-07-31 DIAGNOSIS — F79 Unspecified intellectual disabilities: Secondary | ICD-10-CM | POA: Diagnosis present

## 2018-07-31 DIAGNOSIS — F4329 Adjustment disorder with other symptoms: Secondary | ICD-10-CM | POA: Diagnosis not present

## 2018-07-31 DIAGNOSIS — B1920 Unspecified viral hepatitis C without hepatic coma: Secondary | ICD-10-CM | POA: Diagnosis not present

## 2018-07-31 DIAGNOSIS — Z794 Long term (current) use of insulin: Secondary | ICD-10-CM | POA: Diagnosis not present

## 2018-07-31 DIAGNOSIS — J9811 Atelectasis: Secondary | ICD-10-CM | POA: Diagnosis present

## 2018-07-31 DIAGNOSIS — R5381 Other malaise: Secondary | ICD-10-CM | POA: Diagnosis present

## 2018-07-31 DIAGNOSIS — I10 Essential (primary) hypertension: Secondary | ICD-10-CM | POA: Diagnosis present

## 2018-07-31 DIAGNOSIS — I959 Hypotension, unspecified: Secondary | ICD-10-CM | POA: Diagnosis present

## 2018-07-31 DIAGNOSIS — E119 Type 2 diabetes mellitus without complications: Secondary | ICD-10-CM | POA: Diagnosis present

## 2018-07-31 DIAGNOSIS — E039 Hypothyroidism, unspecified: Secondary | ICD-10-CM | POA: Diagnosis present

## 2018-07-31 DIAGNOSIS — Z751 Person awaiting admission to adequate facility elsewhere: Secondary | ICD-10-CM | POA: Diagnosis not present

## 2018-07-31 DIAGNOSIS — F603 Borderline personality disorder: Secondary | ICD-10-CM | POA: Diagnosis present

## 2018-07-31 DIAGNOSIS — Z87891 Personal history of nicotine dependence: Secondary | ICD-10-CM | POA: Diagnosis not present

## 2018-07-31 DIAGNOSIS — R456 Violent behavior: Secondary | ICD-10-CM | POA: Diagnosis not present

## 2018-07-31 DIAGNOSIS — G4700 Insomnia, unspecified: Secondary | ICD-10-CM | POA: Diagnosis present

## 2018-07-31 DIAGNOSIS — L03213 Periorbital cellulitis: Secondary | ICD-10-CM | POA: Diagnosis present

## 2018-07-31 DIAGNOSIS — R531 Weakness: Secondary | ICD-10-CM | POA: Diagnosis present

## 2018-07-31 DIAGNOSIS — R161 Splenomegaly, not elsewhere classified: Secondary | ICD-10-CM | POA: Diagnosis not present

## 2018-07-31 DIAGNOSIS — F250 Schizoaffective disorder, bipolar type: Secondary | ICD-10-CM | POA: Diagnosis present

## 2018-07-31 LAB — COMPREHENSIVE METABOLIC PANEL
ALT: 14 U/L (ref 0–44)
AST: 24 U/L (ref 15–41)
Albumin: 3.6 g/dL (ref 3.5–5.0)
Alkaline Phosphatase: 51 U/L (ref 38–126)
Anion gap: 12 (ref 5–15)
BUN: 32 mg/dL — ABNORMAL HIGH (ref 6–20)
CO2: 28 mmol/L (ref 22–32)
Calcium: 7.4 mg/dL — ABNORMAL LOW (ref 8.9–10.3)
Chloride: 99 mmol/L (ref 98–111)
Creatinine, Ser: 1.53 mg/dL — ABNORMAL HIGH (ref 0.44–1.00)
GFR calc Af Amer: 44 mL/min — ABNORMAL LOW (ref >60)
GFR calc non Af Amer: 38 mL/min — ABNORMAL LOW (ref >60)
Glucose, Bld: 106 mg/dL — ABNORMAL HIGH (ref 70–99)
Potassium: 5.1 mmol/L (ref 3.5–5.1)
Sodium: 139 mmol/L (ref 135–145)
Total Bilirubin: 0.5 mg/dL (ref 0.3–1.2)
Total Protein: 7.1 g/dL (ref 6.5–8.1)

## 2018-07-31 LAB — CBC WITH DIFFERENTIAL/PLATELET
Abs Immature Granulocytes: 0.02 10*3/uL (ref 0.00–0.07)
Basophils Absolute: 0 10*3/uL (ref 0.0–0.1)
Basophils Relative: 0 %
Eosinophils Absolute: 0 10*3/uL (ref 0.0–0.5)
Eosinophils Relative: 0 %
HCT: 36.9 % (ref 36.0–46.0)
Hemoglobin: 12.1 g/dL (ref 12.0–15.0)
Immature Granulocytes: 0 %
Lymphocytes Relative: 26 %
Lymphs Abs: 1.5 10*3/uL (ref 0.7–4.0)
MCH: 31.3 pg (ref 26.0–34.0)
MCHC: 32.8 g/dL (ref 30.0–36.0)
MCV: 95.3 fL (ref 80.0–100.0)
Monocytes Absolute: 0.8 10*3/uL (ref 0.1–1.0)
Monocytes Relative: 14 %
Neutro Abs: 3.4 10*3/uL (ref 1.7–7.7)
Neutrophils Relative %: 60 %
Platelets: 55 10*3/uL — ABNORMAL LOW (ref 150–400)
RBC: 3.87 MIL/uL (ref 3.87–5.11)
RDW: 13.4 % (ref 11.5–15.5)
WBC: 5.8 10*3/uL (ref 4.0–10.5)
nRBC: 0 % (ref 0.0–0.2)

## 2018-07-31 LAB — PROTIME-INR
INR: 1.1 (ref 0.8–1.2)
Prothrombin Time: 13.6 seconds (ref 11.4–15.2)

## 2018-07-31 LAB — GLUCOSE, CAPILLARY: Glucose-Capillary: 91 mg/dL (ref 70–99)

## 2018-07-31 LAB — SARS CORONAVIRUS 2 BY RT PCR (HOSPITAL ORDER, PERFORMED IN ~~LOC~~ HOSPITAL LAB): SARS Coronavirus 2: NEGATIVE

## 2018-07-31 LAB — APTT: aPTT: 31 seconds (ref 24–36)

## 2018-07-31 LAB — LACTIC ACID, PLASMA: Lactic Acid, Venous: 2 mmol/L (ref 0.5–1.9)

## 2018-07-31 MED ORDER — METRONIDAZOLE IN NACL 5-0.79 MG/ML-% IV SOLN
500.0000 mg | Freq: Once | INTRAVENOUS | Status: AC
  Administered 2018-07-31: 23:00:00 500 mg via INTRAVENOUS
  Filled 2018-07-31: qty 100

## 2018-07-31 MED ORDER — VANCOMYCIN HCL IN DEXTROSE 1-5 GM/200ML-% IV SOLN
1000.0000 mg | Freq: Once | INTRAVENOUS | Status: AC
  Administered 2018-07-31: 22:00:00 1000 mg via INTRAVENOUS
  Filled 2018-07-31: qty 200

## 2018-07-31 MED ORDER — MOXIFLOXACIN HCL 0.5 % OP SOLN
1.0000 [drp] | Freq: Three times a day (TID) | OPHTHALMIC | Status: DC
  Administered 2018-07-31 – 2018-08-04 (×10): 1 [drp] via OPHTHALMIC
  Filled 2018-07-31: qty 3

## 2018-07-31 MED ORDER — SODIUM CHLORIDE 0.9 % IV SOLN
2.0000 g | Freq: Once | INTRAVENOUS | Status: AC
  Administered 2018-07-31: 21:00:00 2 g via INTRAVENOUS
  Filled 2018-07-31: qty 2

## 2018-07-31 MED ORDER — SODIUM CHLORIDE 0.9 % IV BOLUS
1000.0000 mL | Freq: Once | INTRAVENOUS | Status: AC
  Administered 2018-07-31: 1000 mL via INTRAVENOUS

## 2018-07-31 MED ORDER — IOHEXOL 350 MG/ML SOLN
75.0000 mL | Freq: Once | INTRAVENOUS | Status: AC | PRN
  Administered 2018-07-31: 75 mL via INTRAVENOUS

## 2018-07-31 MED ORDER — VANCOMYCIN HCL 500 MG IV SOLR
500.0000 mg | Freq: Once | INTRAVENOUS | Status: AC
  Administered 2018-07-31: 22:00:00 500 mg via INTRAVENOUS
  Filled 2018-07-31: qty 500

## 2018-07-31 NOTE — H&P (Signed)
[facility] NAME: Jessica [name]    MR#:  [id number]  DATE OF BIRTH:  10-14-1965  DATE OF ADMISSION:  07/20/2018  PRIMARY CARE PHYSICIAN: System, Pcp Not In   REQUESTING/REFERRING PHYSICIAN: Kerman Passey, MD  CHIEF COMPLAINT:   Chief Complaint  Patient presents with  . Aggressive Behavior    HISTORY OF PRESENT ILLNESS:  Jessica [name]  is a 53 y.o. female who presents with chief complaint as above.  Patient has been in the ED for quite some time now under psychiatrist care awaiting placement.  Over the last 48 hours she began to develop greater lethargy, then some hypoxia.  She had an episode of some eye swelling concerning for possible orbital cellulitis, although imaging did not show anything along those lines.  Today she has become hypoxic and somewhat more hypotensive.  Etiology for all this is somewhat elusive upfront.  Hospitalist were called for admission  PAST MEDICAL HISTORY:   Past Medical History:  Diagnosis Date  . Anxiety   . Depression   . Diabetes mellitus without complication ([place])   . GERD (gastroesophageal reflux disease)   . Homicidal ideation   . Hypertension   . Hypothyroidism      PAST SURGICAL HISTORY:   Past Surgical History:  Procedure Laterality Date  . COLPOSCOPY    . FRACTURE SURGERY    . TRACHEOSTOMY       SOCIAL HISTORY:   Social History   Tobacco Use  . Smoking status: Former Research scientist (life sciences)  . Smokeless tobacco: Never Used  Substance Use Topics  . Alcohol use: No    Frequency: Never     FAMILY HISTORY:    Family history reviewed and is non-contributory DRUG ALLERGIES:   Allergies  Allergen Reactions  . Abilify [Aripiprazole] Other (See Comments)    Chokes on food  . Haldol [Haloperidol] Other (See Comments)    Dizziness  . Risperdal [Risperidone] Other (See Comments)    Leg weakness    MEDICATIONS AT HOME:   Prior to Admission medications   Medication Sig Start Date  End Date Taking? Authorizing Provider  atorvastatin (LIPITOR) 10 MG tablet Take 1 tablet (10 mg total) by mouth daily. 06/19/18   Clapacs, Madie Reno, MD  carvedilol (COREG) 6.25 MG tablet Take 1 tablet (6.25 mg total) by mouth 2 (two) times daily. 06/19/18   Clapacs, Madie Reno, MD  divalproex (DEPAKOTE) 500 MG DR tablet Take 1 tablet (500 mg total) by mouth every 12 (twelve) hours. 07/17/18   Patrecia Pour, NP  DULoxetine (CYMBALTA) 60 MG capsule Take 1 capsule (60 mg total) by mouth daily. 06/20/18   Clapacs, Madie Reno, MD  ferrous sulfate (FEROSUL) 325 (65 FE) MG tablet Take 1 tablet (325 mg total) by mouth 2 (two) times daily. 06/19/18   Clapacs, Madie Reno, MD  fluPHENAZine (PROLIXIN) 5 MG tablet Take 5 mg by mouth 4 (four) times daily. 07/13/18   [provider]  gabapentin (NEURONTIN) 600 MG tablet Take 1 tablet (600 mg total) by mouth 3 (three) times daily. 06/19/18   Clapacs, Madie Reno, MD  glipiZIDE (GLUCOTROL XL) 5 MG 24 hr tablet Take 5 mg by mouth daily. 06/01/18   [provider]  insulin detemir (LEVEMIR) 100 UNIT/ML injection Inject 0.1 mLs (10 Units total) into the skin 2 (two) times daily. Patient not taking: Reported on 07/21/2018 07/17/18   Patrecia Pour, NP  lamoTRIgine (LAMICTAL) 150 MG tablet Take 1 tablet (  150 mg total) by mouth at bedtime. 07/17/18   Patrecia Pour, NP  levothyroxine (SYNTHROID) 150 MCG tablet Take 1 tablet (150 mcg total) by mouth daily at 6 (six) AM. 06/19/18   Clapacs, Madie Reno, MD  lisinopril (ZESTRIL) 10 MG tablet Take 1 tablet (10 mg total) by mouth daily. 06/19/18   Clapacs, Madie Reno, MD  loratadine (CLARITIN) 10 MG tablet Take 1 tablet (10 mg total) by mouth daily. 06/19/18   Clapacs, Madie Reno, MD  lurasidone (LATUDA) 40 MG TABS tablet Take 3 tablets (120 mg total) by mouth daily. 07/17/18   Patrecia Pour, NP  metFORMIN (GLUCOPHAGE) 500 MG tablet Take 1 tablet (500 mg total) by mouth 2 (two) times daily. 06/19/18   Clapacs, Madie Reno, MD  pantoprazole (PROTONIX) 40 MG  tablet Take 1 tablet (40 mg total) by mouth daily. 06/19/18   Clapacs, Madie Reno, MD  traZODone (DESYREL) 100 MG tablet Take 1 tablet (100 mg total) by mouth at bedtime. 06/19/18   Clapacs, Madie Reno, MD    REVIEW OF SYSTEMS:  Review of Systems  Unable to perform ROS: Acuity of condition     VITAL SIGNS:   Vitals:   07/30/18 2025 07/31/18 0647 07/31/18 1757 07/31/18 1822  BP: (!) 147/56 (!) 100/56 101/60 98/64  Pulse: 100 86  92  Resp: 18 20 18 16   Temp: 99 F (37.2 C) 97.8 F (36.6 C) 99 F (37.2 C) 100 F (37.8 C)  TempSrc: Oral Axillary Oral Rectal  SpO2: 93% 91%  90%   Wt Readings from Last 3 Encounters:  07/15/18 74.8 kg  07/05/18 74.4 kg  06/27/18 71.7 kg    PHYSICAL EXAMINATION:  Physical Exam  Vitals reviewed. Constitutional: She appears well-developed and well-nourished. No distress.  HENT:  Head: Normocephalic and atraumatic.  Mouth/Throat: Oropharynx is clear and moist.  Eyes: Pupils are equal, round, and reactive to light. Conjunctivae and EOM are normal. No scleral icterus.  Neck: Normal range of motion. Neck supple. No JVD present. No thyromegaly present.  Cardiovascular: Normal rate, regular rhythm and intact distal pulses. Exam reveals no gallop and no friction rub.  No murmur heard. Respiratory: Effort normal and breath sounds normal. No respiratory distress. She has no wheezes. She has no rales.  GI: Soft. Bowel sounds are normal. She exhibits no distension. There is no abdominal tenderness.  Musculoskeletal: Normal range of motion.        General: No edema.     Comments: No arthritis, no gout  Lymphadenopathy:    She has no cervical adenopathy.  Neurological:  Patient is very lethargic.  She arouses to verbal stimuli, but is somewhat confused in her responses and quickly drifts back to sleep.  Skin: Skin is warm and dry. No rash noted. No erythema.  Psychiatric:  Unable to assess due to patient condition    LABORATORY PANEL:   CBC Recent Labs  Lab  07/31/18 2103  WBC 5.8  HGB 12.1  HCT 36.9  PLT 55*   ------------------------------------------------------------------------------------------------------------------  Chemistries  Recent Labs  Lab 07/30/18 0837  NA 138  K 4.9  CL 95*  CO2 31  GLUCOSE 164*  BUN 24*  CREATININE 0.76  CALCIUM 8.4*  AST 25  ALT 19  ALKPHOS 71  BILITOT 0.8   ------------------------------------------------------------------------------------------------------------------  Cardiac Enzymes No results for input(s): TROPONINI in the last 168 hours. ------------------------------------------------------------------------------------------------------------------  RADIOLOGY:  Ct Head Wo Contrast  Result Date: 07/30/2018 CLINICAL DATA:  Altered level of consciousness, right  orbital cellulitis EXAM: CT HEAD WITHOUT CONTRAST CT ORBITS WITH CONTRAST TECHNIQUE: Multidetector CT imaging of the head was performed following the standard protocol before bolus injection of intravenous contrast. Multidetector CT imaging of the orbits were performed following the standard protocol during bolus administration of intravenous contrast. CONTRAST:  38mL OMNIPAQUE IOHEXOL 300 MG/ML  SOLN COMPARISON:  None. FINDINGS: CT HEAD FINDINGS Brain: No evidence of acute infarction, hemorrhage, hydrocephalus, extra-axial collection or mass lesion/mass effect. Extensive periventricular and deep white matter hypodensity. Incidental note of cavum septum pellucidum variant of the lateral ventricles. Vascular: No hyperdense vessel or unexpected calcification. Skull: Normal. Negative for fracture or focal lesion. Other: None. CT ORBITS FINDINGS Orbits: There is superficial soft tissue edema of the right palpebra. Globes, optic nerves, orbital fat, extraocular muscles, vascular structures, and lacrimal glands are normal. Visualized sinuses: Clear. Soft tissues: Negative. IMPRESSION: 1. No acute intracranial pathology. There is extensive  periventricular and deep white matter hypodensity, very advanced for patient age, and possibly reflecting demyelinating disorder such as multiple sclerosis. Advanced small-vessel white matter disease is a differential consideration. 2. There is superficial soft tissue edema of the right palpebra. Findings are consistent with preseptal cellulitis without abnormality of the orbital contents. No evidence of abscess. Electronically Signed   By: Eddie Candle M.D.   On: 07/30/2018 13:46   Ct Angio Chest Pe W And/or Wo Contrast  Result Date: 07/31/2018 CLINICAL DATA:  Shortness of breath and hypoxia EXAM: CT ANGIOGRAPHY CHEST WITH CONTRAST TECHNIQUE: Multidetector CT imaging of the chest was performed using the standard protocol during bolus administration of intravenous contrast. Multiplanar CT image reconstructions and MIPs were obtained to evaluate the vascular anatomy. CONTRAST:  60mL OMNIPAQUE IOHEXOL 350 MG/ML SOLN COMPARISON:  Plain film from earlier in the same day. FINDINGS: Cardiovascular: Thoracic aorta demonstrates atherosclerotic calcifications without aneurysmal dilatation or dissection. No cardiac enlargement is seen. The pulmonary artery shows a normal branching pattern. No filling defects are noted to suggest pulmonary embolism. No significant coronary calcifications are noted. Mediastinum/Nodes: Thoracic inlet is within normal limits. No hilar or mediastinal adenopathy is noted. The esophagus is within normal limits. Lungs/Pleura: Mild bibasilar atelectasis is noted. No sizable effusion is seen. No parenchymal nodules are noted. Some changes of air trapping are noted. Upper Abdomen: Visualized upper abdomen is within normal limits. Musculoskeletal: Degenerative changes of the thoracic spine are noted. No rib abnormality is noted. Review of the MIP images confirms the above findings. IMPRESSION: No evidence of pulmonary emboli. Mild atelectasis bilaterally. No other focal abnormality is noted. Aortic  Atherosclerosis (ICD10-I70.0). Electronically Signed   By: Inez Catalina M.D.   On: 07/31/2018 21:54   Dg Chest Portable 1 View  Result Date: 07/31/2018 CLINICAL DATA:  Cough. Former smoker. History of diabetes and hypertension. EXAM: PORTABLE CHEST 1 VIEW COMPARISON:  10/07/2016 FINDINGS: Cardiac silhouette top-normal in size. No mediastinal or hilar masses or convincing adenopathy. Clear lungs.  No pleural effusion or pneumothorax. Skeletal structures are grossly intact. IMPRESSION: No active disease. Electronically Signed   By: Lajean Manes M.D.   On: 07/31/2018 07:52   Ct Orbits W Contrast  Result Date: 07/30/2018 CLINICAL DATA:  Altered level of consciousness, right orbital cellulitis EXAM: CT HEAD WITHOUT CONTRAST CT ORBITS WITH CONTRAST TECHNIQUE: Multidetector CT imaging of the head was performed following the standard protocol before bolus injection of intravenous contrast. Multidetector CT imaging of the orbits were performed following the standard protocol during bolus administration of intravenous contrast. CONTRAST:  74mL OMNIPAQUE IOHEXOL 300  MG/ML  SOLN COMPARISON:  None. FINDINGS: CT HEAD FINDINGS Brain: No evidence of acute infarction, hemorrhage, hydrocephalus, extra-axial collection or mass lesion/mass effect. Extensive periventricular and deep white matter hypodensity. Incidental note of cavum septum pellucidum variant of the lateral ventricles. Vascular: No hyperdense vessel or unexpected calcification. Skull: Normal. Negative for fracture or focal lesion. Other: None. CT ORBITS FINDINGS Orbits: There is superficial soft tissue edema of the right palpebra. Globes, optic nerves, orbital fat, extraocular muscles, vascular structures, and lacrimal glands are normal. Visualized sinuses: Clear. Soft tissues: Negative. IMPRESSION: 1. No acute intracranial pathology. There is extensive periventricular and deep white matter hypodensity, very advanced for patient age, and possibly reflecting  demyelinating disorder such as multiple sclerosis. Advanced small-vessel white matter disease is a differential consideration. 2. There is superficial soft tissue edema of the right palpebra. Findings are consistent with preseptal cellulitis without abnormality of the orbital contents. No evidence of abscess. Electronically Signed   By: Eddie Candle M.D.   On: 07/30/2018 13:46    EKG:   Orders placed or performed during the hospital encounter of 07/20/18  . ED EKG 12-Lead  . ED EKG 12-Lead  . EKG 12-Lead  . EKG 12-Lead    IMPRESSION AND PLAN:  Principal Problem:   Hypoxia -cause for her hypoxia is not entirely clear.  There was some thought that maybe she had a PE, or possible aspiration event.  Lab work up and chest imaging including CTA chest did not support either of those differentials.  However, patient does have a new oxygen requirement.  She has tested negative for coronavirus multiple times.  IV antibiotics were given empirically, procalcitonin is pending.  Her lactic acid was barely elevated at 2.  I do suspect some possible dehydration component here, given that her specific gravity on her UA yesterday was elevated.  IV fluids are in place, and we will repeat lactic acid check Active Problems:   Schizoaffective disorder, bipolar type [facility]) -psychiatry is managing the patient's medications and treatment for this problem.  Continue to defer to their recommendations   Diabetes mellitus without complication ([place]) -glucose levels relatively stable, sliding scale insulin coverage ordered   HTN (hypertension) -hold antihypertensives for now as the patient is borderline hypotensive, IV fluids as above for blood pressure support   GERD (gastroesophageal reflux disease) -continue home dose PPI   Hypothyroidism -home dose thyroid replacement  Chart review performed and case discussed with ED provider. Labs, imaging and/or ECG reviewed by provider and discussed with patient/family. Management  plans discussed with the patient and/or family.  COVID-19 status: Tested negative     DVT PROPHYLAXIS: Mechanical only  GI PROPHYLAXIS:  PPI  ADMISSION STATUS: Inpatient     CODE STATUS: Full Code Status History    Date Active Date Inactive Code Status Order ID Comments User Context   06/15/2018 2326 06/20/2018 1923 Full Code [id number]  Lavella Hammock, MD Inpatient   06/04/2018 1550 06/07/2018 1708 Full Code [id number]  Chong Sicilian, DO Inpatient   12/29/2017 1920 01/03/2018 1357 Full Code [id number]  Clovis Fredrickson, MD Inpatient   01/02/2017 2107 01/15/2017 1709 Full Code [id number]  Vicenta Aly, NP Inpatient   Advance Care Planning Activity      TOTAL TIME TAKING CARE OF THIS PATIENT: 45 minutes.   This patient was evaluated in the context of the global COVID-19 pandemic, which necessitated consideration that the patient might be at risk for infection with the SARS-CoV-2 virus that causes COVID-19. Institutional  protocols and algorithms that pertain to the evaluation of patients at risk for COVID-19 are in a state of rapid change based on information released by regulatory bodies including the CDC and federal and state organizations. These policies and algorithms were followed to the best of this provider's knowledge to date during the patient's care at this facility.  Ethlyn Daniels 07/31/2018, 10:12 PM  [facility]  Office  [phone]  CC: Primary care physician; System, Pcp Not In  Note:  This document was prepared using Dragon voice recognition software and may include unintentional dictation errors.

## 2018-07-31 NOTE — ED Notes (Signed)
ED TO INPATIENT HANDOFF REPORT  ED Nurse Name and Phone #: Lelon Frohlich [phone]  S Name/Age/Gender Jessica [name] 53 y.o. female Room/Bed: ED24A/ED24A  Code Status   Code Status: Prior  Home/SNF/Other group home Patient oriented to: self, place, time and situation Is this baseline? Yes   Triage Complete: Triage complete  Chief Complaint beh med eval  Triage Note First RN Note: Pt presents to ED via [employer] under IVC papers with c/o becoming aggressive with people at her group home. Per [employer] pt using racial slurs with people at her group home after wandering off from group home. [employer] reports pt picking up items and attempting to throw them at [employer] officers.   Pt brought in by [employer] officer.  Pt is IVC.  Pt discharged at noon today from the ER.  Pt went to the group home and threatened to kill herself and was breaking objects.  Pt alert and calm at this time.    Allergies Allergies  Allergen Reactions  . Abilify [Aripiprazole] Other (See Comments)    Chokes on food  . Haldol [Haloperidol] Other (See Comments)    Dizziness  . Risperdal [Risperidone] Other (See Comments)    Leg weakness    Level of Care/Admitting Diagnosis ED Disposition    ED Disposition Condition [facility]: [place] [[id number]]  Level of Care: Med-Surg [16]  Covid Evaluation: Confirmed COVID Negative  Diagnosis: Hypoxia [[id number]]  Admitting Physician: Lance Coon [[id number]]  Attending Physician: Jannifer Franklin, DAVID [phone]  Estimated length of stay: past midnight tomorrow  Certification:: I certify this patient will need inpatient services for at least 2 midnights  PT Class (Do Not Modify): Inpatient [101]  PT Acc Code (Do Not Modify): Private [1]       B Medical/Surgery History Past Medical History:  Diagnosis Date  . Anxiety   . Depression   . Diabetes mellitus without complication ([place])   . GERD (gastroesophageal reflux disease)   . Homicidal ideation   .  Hypertension   . Hypothyroidism    Past Surgical History:  Procedure Laterality Date  . COLPOSCOPY    . FRACTURE SURGERY    . TRACHEOSTOMY       A IV Location/Drains/Wounds Patient Lines/Drains/Airways Status   Active Line/Drains/Airways    Name:   Placement date:   Placement time:   Site:   Days:   Peripheral IV 07/30/18 Left Antecubital   07/30/18    1025    Antecubital   1   Peripheral IV 07/31/18 Left Arm   07/31/18    2115    Arm   less than 1          Intake/Output Last 24 hours  Intake/Output Summary (Last 24 hours) at 07/31/2018 2240 Last data filed at 07/31/2018 2205 Gross per 24 hour  Intake 1000 ml  Output -  Net 1000 ml    Labs/Imaging Results for orders placed or performed during the hospital encounter of 07/20/18 (from the past 48 hour(s))  CBC with Differential     Status: Abnormal   Collection Time: 07/30/18  8:37 AM  Result Value Ref Range   WBC 6.3 4.0 - 10.5 K/uL   RBC 4.31 3.87 - 5.11 MIL/uL   Hemoglobin 13.3 12.0 - 15.0 g/dL   HCT 40.8 36.0 - 46.0 %   MCV 94.7 80.0 - 100.0 fL   MCH 30.9 26.0 - 34.0 pg   MCHC 32.6 30.0 - 36.0 g/dL  RDW 13.0 11.5 - 15.5 %   Platelets 56 (L) 150 - 400 K/uL    Comment: Immature Platelet Fraction may be clinically indicated, consider ordering this additional test [id number]    nRBC 0.0 0.0 - 0.2 %   Neutrophils Relative % 67 %   Neutro Abs 4.2 1.7 - 7.7 K/uL   Lymphocytes Relative 20 %   Lymphs Abs 1.3 0.7 - 4.0 K/uL   Monocytes Relative 12 %   Monocytes Absolute 0.8 0.1 - 1.0 K/uL   Eosinophils Relative 0 %   Eosinophils Absolute 0.0 0.0 - 0.5 K/uL   Basophils Relative 0 %   Basophils Absolute 0.0 0.0 - 0.1 K/uL   Immature Granulocytes 1 %   Abs Immature Granulocytes 0.05 0.00 - 0.07 K/uL    Comment: Performed at [facility], [address] [place], [place] [address]  Comprehensive metabolic panel     Status: Abnormal   Collection Time: 07/30/18  8:37 AM  Result Value Ref Range   Sodium 138  135 - 145 mmol/L   Potassium 4.9 3.5 - 5.1 mmol/L   Chloride 95 (L) 98 - 111 mmol/L   CO2 31 22 - 32 mmol/L   Glucose, Bld 164 (H) 70 - 99 mg/dL   BUN 24 (H) 6 - 20 mg/dL   Creatinine, Ser 0.76 0.44 - 1.00 mg/dL   Calcium 8.4 (L) 8.9 - 10.3 mg/dL   Total Protein 8.2 (H) 6.5 - 8.1 g/dL   Albumin 4.3 3.5 - 5.0 g/dL   AST 25 15 - 41 U/L   ALT 19 0 - 44 U/L   Alkaline Phosphatase 71 38 - 126 U/L   Total Bilirubin 0.8 0.3 - 1.2 mg/dL   GFR calc non Af Amer >60 >60 mL/min   GFR calc Af Amer >60 >60 mL/min   Anion gap 12 5 - 15    Comment: Performed at [facility], [address], [place], [place] [address]  Valproic acid level     Status: Abnormal   Collection Time: 07/30/18  8:37 AM  Result Value Ref Range   Valproic Acid Lvl 39 (L) 50.0 - 100.0 ug/mL    Comment: Performed at [facility], [address] [place], [place] [address]  Blood gas, venous     Status: Abnormal   Collection Time: 07/30/18  9:48 AM  Result Value Ref Range   pH, Ven 7.44 (H) 7.250 - 7.430   pCO2, Ven 53 44.0 - 60.0 mmHg   pO2, Ven 38.0 32.0 - 45.0 mmHg   Bicarbonate 36.0 (H) 20.0 - 28.0 mmol/L   Acid-Base Excess 9.9 (H) 0.0 - 2.0 mmol/L   O2 Saturation 74.6 %   Patient temperature 37.0    Sample type VENOUS     Comment: Performed at [facility], [address] [place], [place] [address]  Ammonia     Status: None   Collection Time: 07/30/18 10:23 AM  Result Value Ref Range   Ammonia 34 9 - 35 umol/L    Comment: Performed at [facility], [address] [place], [place] [address]  TSH     Status: None   Collection Time: 07/30/18 10:23 AM  Result Value Ref Range   TSH 0.355 0.350 - 4.500 uIU/mL    Comment: Performed by a 3rd Generation assay with a functional sensitivity of <=0.01 uIU/mL. Performed at [facility], [address], [place],  [address]   Glucose, capillary  Status: Abnormal   Collection Time: 07/30/18  5:34 PM  Result Value  Ref Range   Glucose-Capillary 162 (H) 70 - 99 mg/dL  Urinalysis, Complete w Microscopic     Status: Abnormal   Collection Time: 07/30/18  8:56 PM  Result Value Ref Range   Color, Urine YELLOW (A) YELLOW   APPearance CLEAR (A) CLEAR   Specific Gravity, Urine 1.033 (H) 1.005 - 1.030   pH 7.0 5.0 - 8.0   Glucose, UA NEGATIVE NEGATIVE mg/dL   Hgb urine dipstick NEGATIVE NEGATIVE   Bilirubin Urine NEGATIVE NEGATIVE   Ketones, ur 5 (A) NEGATIVE mg/dL   Protein, ur 30 (A) NEGATIVE mg/dL   Nitrite NEGATIVE NEGATIVE   Leukocytes,Ua NEGATIVE NEGATIVE   RBC / HPF 0-5 0 - 5 RBC/hpf   WBC, UA 0-5 0 - 5 WBC/hpf   Bacteria, UA NONE SEEN NONE SEEN   Squamous Epithelial / LPF 0-5 0 - 5    Comment: Performed at [facility], [address] [place], [place] [address]  Glucose, capillary     Status: Abnormal   Collection Time: 07/30/18  9:53 PM  Result Value Ref Range   Glucose-Capillary 145 (H) 70 - 99 mg/dL  SARS Coronavirus 2 (CEPHEID- Performed in [place] hospital lab), Hosp Order     Status: None   Collection Time: 07/31/18  7:44 AM   Specimen: Nasopharyngeal Swab  Result Value Ref Range   SARS Coronavirus 2 NEGATIVE NEGATIVE    Comment: (NOTE) If result is NEGATIVE SARS-CoV-2 target nucleic acids are NOT DETECTED. The SARS-CoV-2 RNA is generally detectable in upper and lower  respiratory specimens during the acute phase of infection. The lowest  concentration of SARS-CoV-2 viral copies this assay can detect is 250  copies / mL. A negative result does not preclude SARS-CoV-2 infection  and should not be used as the sole basis for treatment or other  patient management decisions.  A negative result may occur with  improper specimen collection / handling, submission of specimen other  than nasopharyngeal swab, presence of viral mutation(s) within the  areas targeted by this assay, and inadequate number of viral copies  (<250 copies / mL). A negative result must be combined  with clinical  observations, patient history, and epidemiological information. If result is POSITIVE SARS-CoV-2 target nucleic acids are DETECTED. The SARS-CoV-2 RNA is generally detectable in upper and lower  respiratory specimens dur ing the acute phase of infection.  Positive  results are indicative of active infection with SARS-CoV-2.  Clinical  correlation with patient history and other diagnostic information is  necessary to determine patient infection status.  Positive results do  not rule out bacterial infection or co-infection with other viruses. If result is PRESUMPTIVE POSTIVE SARS-CoV-2 nucleic acids MAY BE PRESENT.   A presumptive positive result was obtained on the submitted specimen  and confirmed on repeat testing.  While 2019 novel coronavirus  (SARS-CoV-2) nucleic acids may be present in the submitted sample  additional confirmatory testing may be necessary for epidemiological  and / or clinical management purposes  to differentiate between  SARS-CoV-2 and other Sarbecovirus currently known to infect humans.  If clinically indicated additional testing with an alternate test  methodology [phone]) is advised. The SARS-CoV-2 RNA is generally  detectable in upper and lower respiratory sp ecimens during the acute  phase of infection. The expected result is Negative. Fact Sheet for Patients:  StrictlyIdeas.no Fact Sheet for Healthcare Providers: BankingDealers.co.za This test is not yet  approved or cleared by the [place] and has been authorized for detection and/or diagnosis of SARS-CoV-2 by FDA under an Emergency Use Authorization (EUA).  This EUA will remain in effect (meaning this test can be used) for the duration of the COVID-19 declaration under Section 564(b)(1) of the Act, 21 U.S.C. section 360bbb-3(b)(1), unless the authorization is terminated or revoked sooner. Performed at [facility], [address] [place], [place] [address]   Glucose, capillary     Status: None   Collection Time: 07/31/18 12:02 PM  Result Value Ref Range   Glucose-Capillary 91 70 - 99 mg/dL   Comment 1 Notify RN    Comment 2 Document in Chart   Lactic acid, plasma     Status: Abnormal   Collection Time: 07/31/18  9:03 PM  Result Value Ref Range   Lactic Acid, Venous 2.0 (HH) 0.5 - 1.9 mmol/L    Comment: CRITICAL RESULT CALLED TO, READ BACK BY AND VERIFIED WITH ANN Jkai Arwood ON 07/31/18 AT 2220 [facility] Performed at [facility], [address] [place], [place] [address]   APTT     Status: None   Collection Time: 07/31/18  9:03 PM  Result Value Ref Range   aPTT 31 24 - 36 seconds    Comment: Performed at [facility] [address] [place], [place] [address]  Protime-INR     Status: None   Collection Time: 07/31/18  9:03 PM  Result Value Ref Range   Prothrombin Time 13.6 11.4 - 15.2 seconds   INR 1.1 0.8 - 1.2    Comment: (NOTE) INR goal varies based on device and disease states. Performed at [facility], [address] [place], [place] [address]   CBC with Differential     Status: Abnormal   Collection Time: 07/31/18  9:03 PM  Result Value Ref Range   WBC 5.8 4.0 - 10.5 K/uL   RBC 3.87 3.87 - 5.11 MIL/uL   Hemoglobin 12.1 12.0 - 15.0 g/dL   HCT 36.9 36.0 - 46.0 %   MCV 95.3 80.0 - 100.0 fL   MCH 31.3 26.0 - 34.0 pg   MCHC 32.8 30.0 - 36.0 g/dL   RDW 13.4 11.5 - 15.5 %   Platelets 55 (L) 150 - 400 K/uL    Comment: Immature Platelet Fraction may be clinically indicated, consider ordering this additional test [id number]    nRBC 0.0 0.0 - 0.2 %   Neutrophils Relative % 60 %   Neutro Abs 3.4 1.7 - 7.7 K/uL   Lymphocytes Relative 26 %   Lymphs Abs 1.5 0.7 - 4.0 K/uL   Monocytes Relative 14 %   Monocytes Absolute 0.8 0.1 - 1.0 K/uL   Eosinophils Relative 0 %   Eosinophils Absolute 0.0 0.0 - 0.5 K/uL   Basophils Relative 0 %   Basophils Absolute 0.0 0.0 -  0.1 K/uL   Immature Granulocytes 0 %   Abs Immature Granulocytes 0.02 0.00 - 0.07 K/uL    Comment: Performed at [facility], [address] [place], [place] [address]  Comprehensive metabolic panel     Status: Abnormal   Collection Time: 07/31/18  9:03 PM  Result Value Ref Range   Sodium 139 135 - 145 mmol/L   Potassium 5.1 3.5 - 5.1 mmol/L   Chloride 99 98 - 111 mmol/L   CO2 28 22 - 32 mmol/L   Glucose, Bld 106 (H) 70 - 99 mg/dL   BUN 32 (H)  6 - 20 mg/dL   Creatinine, Ser 1.53 (H) 0.44 - 1.00 mg/dL   Calcium 7.4 (L) 8.9 - 10.3 mg/dL   Total Protein 7.1 6.5 - 8.1 g/dL   Albumin 3.6 3.5 - 5.0 g/dL   AST 24 15 - 41 U/L   ALT 14 0 - 44 U/L   Alkaline Phosphatase 51 38 - 126 U/L   Total Bilirubin 0.5 0.3 - 1.2 mg/dL   GFR calc non Af Amer 38 (L) >60 mL/min   GFR calc Af Amer 44 (L) >60 mL/min   Anion gap 12 5 - 15    Comment: Performed at [facility] [address], [place], [place] [address]   Ct Head Wo Contrast  Result Date: 07/30/2018 CLINICAL DATA:  Altered level of consciousness, right orbital cellulitis EXAM: CT HEAD WITHOUT CONTRAST CT ORBITS WITH CONTRAST TECHNIQUE: Multidetector CT imaging of the head was performed following the standard protocol before bolus injection of intravenous contrast. Multidetector CT imaging of the orbits were performed following the standard protocol during bolus administration of intravenous contrast. CONTRAST:  6mL OMNIPAQUE IOHEXOL 300 MG/ML  SOLN COMPARISON:  None. FINDINGS: CT HEAD FINDINGS Brain: No evidence of acute infarction, hemorrhage, hydrocephalus, extra-axial collection or mass lesion/mass effect. Extensive periventricular and deep white matter hypodensity. Incidental note of cavum septum pellucidum variant of the lateral ventricles. Vascular: No hyperdense vessel or unexpected calcification. Skull: Normal. Negative for fracture or focal lesion. Other: None. CT ORBITS FINDINGS Orbits: There is superficial soft tissue  edema of the right palpebra. Globes, optic nerves, orbital fat, extraocular muscles, vascular structures, and lacrimal glands are normal. Visualized sinuses: Clear. Soft tissues: Negative. IMPRESSION: 1. No acute intracranial pathology. There is extensive periventricular and deep white matter hypodensity, very advanced for patient age, and possibly reflecting demyelinating disorder such as multiple sclerosis. Advanced small-vessel white matter disease is a differential consideration. 2. There is superficial soft tissue edema of the right palpebra. Findings are consistent with preseptal cellulitis without abnormality of the orbital contents. No evidence of abscess. Electronically Signed   By: Eddie Candle M.D.   On: 07/30/2018 13:46   Ct Angio Chest Pe W And/or Wo Contrast  Result Date: 07/31/2018 CLINICAL DATA:  Shortness of breath and hypoxia EXAM: CT ANGIOGRAPHY CHEST WITH CONTRAST TECHNIQUE: Multidetector CT imaging of the chest was performed using the standard protocol during bolus administration of intravenous contrast. Multiplanar CT image reconstructions and MIPs were obtained to evaluate the vascular anatomy. CONTRAST:  83mL OMNIPAQUE IOHEXOL 350 MG/ML SOLN COMPARISON:  Plain film from earlier in the same day. FINDINGS: Cardiovascular: Thoracic aorta demonstrates atherosclerotic calcifications without aneurysmal dilatation or dissection. No cardiac enlargement is seen. The pulmonary artery shows a normal branching pattern. No filling defects are noted to suggest pulmonary embolism. No significant coronary calcifications are noted. Mediastinum/Nodes: Thoracic inlet is within normal limits. No hilar or mediastinal adenopathy is noted. The esophagus is within normal limits. Lungs/Pleura: Mild bibasilar atelectasis is noted. No sizable effusion is seen. No parenchymal nodules are noted. Some changes of air trapping are noted. Upper Abdomen: Visualized upper abdomen is within normal limits. Musculoskeletal:  Degenerative changes of the thoracic spine are noted. No rib abnormality is noted. Review of the MIP images confirms the above findings. IMPRESSION: No evidence of pulmonary emboli. Mild atelectasis bilaterally. No other focal abnormality is noted. Aortic Atherosclerosis (ICD10-I70.0). Electronically Signed   By: Inez Catalina M.D.   On: 07/31/2018 21:54   Dg Chest Portable 1 View  Result Date:  07/31/2018 CLINICAL DATA:  Cough. Former smoker. History of diabetes and hypertension. EXAM: PORTABLE CHEST 1 VIEW COMPARISON:  10/07/2016 FINDINGS: Cardiac silhouette top-normal in size. No mediastinal or hilar masses or convincing adenopathy. Clear lungs.  No pleural effusion or pneumothorax. Skeletal structures are grossly intact. IMPRESSION: No active disease. Electronically Signed   By: Lajean Manes M.D.   On: 07/31/2018 07:52   Ct Orbits W Contrast  Result Date: 07/30/2018 CLINICAL DATA:  Altered level of consciousness, right orbital cellulitis EXAM: CT HEAD WITHOUT CONTRAST CT ORBITS WITH CONTRAST TECHNIQUE: Multidetector CT imaging of the head was performed following the standard protocol before bolus injection of intravenous contrast. Multidetector CT imaging of the orbits were performed following the standard protocol during bolus administration of intravenous contrast. CONTRAST:  10mL OMNIPAQUE IOHEXOL 300 MG/ML  SOLN COMPARISON:  None. FINDINGS: CT HEAD FINDINGS Brain: No evidence of acute infarction, hemorrhage, hydrocephalus, extra-axial collection or mass lesion/mass effect. Extensive periventricular and deep white matter hypodensity. Incidental note of cavum septum pellucidum variant of the lateral ventricles. Vascular: No hyperdense vessel or unexpected calcification. Skull: Normal. Negative for fracture or focal lesion. Other: None. CT ORBITS FINDINGS Orbits: There is superficial soft tissue edema of the right palpebra. Globes, optic nerves, orbital fat, extraocular muscles, vascular structures, and  lacrimal glands are normal. Visualized sinuses: Clear. Soft tissues: Negative. IMPRESSION: 1. No acute intracranial pathology. There is extensive periventricular and deep white matter hypodensity, very advanced for patient age, and possibly reflecting demyelinating disorder such as multiple sclerosis. Advanced small-vessel white matter disease is a differential consideration. 2. There is superficial soft tissue edema of the right palpebra. Findings are consistent with preseptal cellulitis without abnormality of the orbital contents. No evidence of abscess. Electronically Signed   By: Eddie Candle M.D.   On: 07/30/2018 13:46    Pending Labs Unresulted Labs (From admission, onward)    Start     Ordered   07/31/18 2222  Procalcitonin - Baseline  Add-on,   AD     07/31/18 2221   07/31/18 2019  Urine culture  ONCE - STAT,   STAT     07/31/18 2019   07/31/18 2018  Lactic acid, plasma  Now then every 2 hours,   STAT     07/31/18 2019   07/31/18 2018  Blood culture (routine x 2)  BLOOD CULTURE X 2,   STAT     07/31/18 2019   Signed and Held  HIV antibody (Routine Testing)  Once,   R     Signed and Held   Signed and Held  Basic metabolic panel  Tomorrow morning,   R     Signed and Held   Signed and Held  CBC  Tomorrow morning,   R     Signed and Held          Vitals/Pain Today's Vitals   07/31/18 1538 07/31/18 1757 07/31/18 1822 07/31/18 2200  BP:  101/60 98/64 (!) 95/57  Pulse:   92 86  Resp:  18 16 18   Temp:  99 F (37.2 C) 100 F (37.8 C)   TempSrc:  Oral Rectal   SpO2:   90% 98%  PainSc: Asleep       Isolation Precautions No active isolations  Medications Medications  atorvastatin (LIPITOR) tablet 10 mg (10 mg Oral Given 07/31/18 1151)  divalproex (DEPAKOTE) DR tablet 500 mg (500 mg Oral Given 07/31/18 2237)  ferrous sulfate tablet 325 mg (325 mg Oral Given 07/31/18 2237)  levothyroxine (  SYNTHROID) tablet 150 mcg (150 mcg Oral Given 07/31/18 0651)  metFORMIN (GLUCOPHAGE) tablet  500 mg (500 mg Oral Given 07/31/18 1150)  pantoprazole (PROTONIX) EC tablet 40 mg (40 mg Oral Given 07/31/18 1157)  traZODone (DESYREL) tablet 100 mg (100 mg Oral Given 07/31/18 2237)  divalproex (DEPAKOTE) DR tablet 250 mg (250 mg Oral Given 07/31/18 1805)  DULoxetine (CYMBALTA) DR capsule 30 mg (30 mg Oral Given 07/31/18 1148)  benztropine (COGENTIN) tablet 0.5 mg (has no administration in time range)  lamoTRIgine (LAMICTAL) tablet 100 mg (100 mg Oral Given 07/31/18 2237)  gabapentin (NEURONTIN) capsule 200 mg (200 mg Oral Given 07/31/18 2238)  paliperidone (INVEGA) 24 hr tablet 3 mg (3 mg Oral Given 07/31/18 1155)  moxifloxacin (VIGAMOX) 0.5 % ophthalmic solution 1 drop (1 drop Right Eye Given 07/31/18 2238)  metroNIDAZOLE (FLAGYL) IVPB 500 mg (500 mg Intravenous New Bag/Given 07/31/18 2234)  vancomycin (VANCOCIN) IVPB 1000 mg/200 mL premix (1,000 mg Intravenous New Bag/Given 07/31/18 2157)  vancomycin (VANCOCIN) 500 mg in sodium chloride 0.9 % 100 mL IVPB (500 mg Intravenous New Bag/Given 07/31/18 2202)  haloperidol (HALDOL) tablet 5 mg (5 mg Oral Given 07/26/18 1728)  diphenhydrAMINE (BENADRYL) capsule 25 mg (25 mg Oral Given 07/26/18 1729)  haloperidol (HALDOL) tablet 2 mg (2 mg Oral Given 07/28/18 1849)  OLANZapine (ZYPREXA) tablet 10 mg ( Oral See Alternative 07/29/18 0900)    Or  OLANZapine (ZYPREXA) injection 10 mg (10 mg Intramuscular Given 07/29/18 0900)  chlorproMAZINE (THORAZINE) injection 50 mg (50 mg Intramuscular Given 07/29/18 1451)  LORazepam (ATIVAN) injection 2 mg (2 mg Intramuscular Given 07/29/18 1451)  LORazepam (ATIVAN) injection 2 mg (2 mg Intramuscular Given 07/29/18 2044)  diphenhydrAMINE (BENADRYL) injection 25 mg (25 mg Intramuscular Given 07/29/18 2043)  sodium chloride 0.9 % bolus 1,000 mL (0 mLs Intravenous Stopped 07/30/18 1228)  clindamycin (CLEOCIN) IVPB 900 mg (0 mg Intravenous Stopped 07/30/18 1122)  LORazepam (ATIVAN) tablet 1 mg (1 mg Oral Given 07/31/18 1805)  iohexol  (OMNIPAQUE) 300 MG/ML solution 75 mL (75 mLs Intravenous Contrast Given 07/30/18 1329)  LORazepam (ATIVAN) injection 1 mg (1 mg Intravenous Given 07/30/18 1226)  cefTRIAXone (ROCEPHIN) 2 g in sodium chloride 0.9 % 100 mL IVPB ( Intravenous Stopped 07/30/18 1515)  haloperidol lactate (HALDOL) injection 10 mg (10 mg Intramuscular Given 07/30/18 1930)  ceFEPIme (MAXIPIME) 2 g in sodium chloride 0.9 % 100 mL IVPB (0 g Intravenous Stopped 07/31/18 2157)  sodium chloride 0.9 % bolus 1,000 mL (1,000 mLs Intravenous New Bag/Given 07/31/18 2236)  sodium chloride 0.9 % bolus 1,000 mL (0 mLs Intravenous Stopped 07/31/18 2205)  iohexol (OMNIPAQUE) 350 MG/ML injection 75 mL (75 mLs Intravenous Contrast Given 07/31/18 2132)    Mobility walks with person assist High fall risk   Focused Assessments na   R Recommendations: See Admitting Provider Note  Report given to:   Additional Notes: has been in ED for numerous days. Generally alert, cares for self for ADLs, walks. Over the last day condition has declined. Now needs assistance.

## 2018-07-31 NOTE — Progress Notes (Signed)
CODE SEPSIS - PHARMACY COMMUNICATION  **Broad Spectrum Antibiotics should be administered within 1 hour of Sepsis diagnosis**  Time Code Sepsis Called/Page Received: 2021  Antibiotics Ordered: Cefepime,Vancomycin  Time of 1st antibiotic administration: 2123  Additional action taken by pharmacy: Spoke to nurse 2 times regarding initiation of therapy.  If necessary, Name of Provider/Nurse Contacted: Nat Math ,PharmD Clinical Pharmacist  07/31/2018  9:36 PM

## 2018-07-31 NOTE — ED Provider Notes (Addendum)
-----------------------------------------   7:53 PM on 07/31/2018 -----------------------------------------  Patient was found to have a rectal temperature of 100.0, lower oxygen saturation in the low 90s on room air.  Coronavirus swab was performed today that is negative.  Chest x-ray performed today that is negative.  Lab work performed yesterday is largely within normal limits.  We will repeat a CBC and BMP today to ensure no leukocytosis.  In reviewing the patient's prior temperatures she has remained in the 99 range for most of her stay.  We will repeat a rectal temperature as well.  We will continue to closely monitor.  Urinalysis from yesterday does not appear to show any urinary tract infection.   Harvest Dark, MD 07/31/18 1954   Patient's blood pressure is now persistently hypotensive in the 70s at this time.  We will dose 2 L of IV fluids.  Patient is now hypoxic in the upper 80s to low 90s on room air.  Borderline low-grade temperature 100.0 rectally.  Patient is very somnolent.  Given the patient's worsening somnolence/altered mental status now with intermittent hypoxia, hypotension low-grade fever we will start the patient on IV antibiotics.  Patient receiving IV fluids.  We will check labs including lactic acid.  We will admit to the hospitalist service for further work-up and management.   Harvest Dark, MD 07/31/18 2107  EKG viewed and interpreted by myself shows normal sinus rhythm 89 bpm with a narrow QRS, normal axis, normal intervals, no concerning ST changes.   Harvest Dark, MD 07/31/18 2239

## 2018-07-31 NOTE — ED Notes (Signed)
Pt given dinner tray. Pt alert enough to chew and swallow but not able to sit herself up or open eyes. Pt fed and ate half of banana and three quarters of pizza. Pt drank entire apple juice.

## 2018-07-31 NOTE — ED Notes (Signed)
Pt took meds at 1800 whole in applesauce. She sipped a diet ginger ale.

## 2018-07-31 NOTE — ED Provider Notes (Signed)
POC 91. Ate some, but not a full meal. Will d/c levemir for now.    Delman Kitten, MD 07/31/18 [phone]

## 2018-07-31 NOTE — ED Provider Notes (Signed)
cxr negative COVID negative Vitals:   07/30/18 2025 07/31/18 0647  BP: (!) 147/56 (!) 100/56  Pulse: 100 86  Resp: 18 20  Temp: 99 F (37.2 C) 97.8 F (36.6 C)  SpO2: 93% 91%    Patient resting calmly.  No acute distress.  Was noted to have a slight cough, chest x-ray is negative for acute in her COVID test on repeat is negative.  She is resting comfortably without distress noted at this time.  Continue to be followed clinically while in the emergency department with psychiatry disposition pending   Delman Kitten, MD 07/31/18 [phone]

## 2018-07-31 NOTE — ED Notes (Signed)
Pt took medications in applesauce with no difficulty.

## 2018-07-31 NOTE — ED Notes (Signed)
Dinner tray at bedside. Pt does not want to eat now.

## 2018-07-31 NOTE — Progress Notes (Signed)
PHARMACY -  BRIEF ANTIBIOTIC NOTE   Pharmacy has received consult(s) for Vancomycin and Cefepime from an ED provider.  The patient's profile has been reviewed for ht/wt/allergies/indication/available labs.    One time order(s) placed for Vancomycin 1500mg  IV(2 be given in 2 separate doses,100mg  and 500mg ) and 2g Cefepime IV.  Further antibiotics/pharmacy consults should be ordered by admitting physician if indicated.                       Thank you, Pearla Dubonnet 07/31/2018  9:39 PM
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
## 2018-07-31 NOTE — ED Notes (Signed)
Pt given meal tray.

## 2018-08-01 ENCOUNTER — Other Ambulatory Visit: Payer: Self-pay

## 2018-08-01 DIAGNOSIS — F4329 Adjustment disorder with other symptoms: Secondary | ICD-10-CM

## 2018-08-01 LAB — CBC
HCT: 33.7 % — ABNORMAL LOW (ref 36.0–46.0)
Hemoglobin: 10.8 g/dL — ABNORMAL LOW (ref 12.0–15.0)
MCH: 31.1 pg (ref 26.0–34.0)
MCHC: 32 g/dL (ref 30.0–36.0)
MCV: 97.1 fL (ref 80.0–100.0)
Platelets: 35 10*3/uL — ABNORMAL LOW (ref 150–400)
RBC: 3.47 MIL/uL — ABNORMAL LOW (ref 3.87–5.11)
RDW: 13.4 % (ref 11.5–15.5)
WBC: 4.3 10*3/uL (ref 4.0–10.5)
nRBC: 0 % (ref 0.0–0.2)

## 2018-08-01 LAB — BASIC METABOLIC PANEL
Anion gap: 8 (ref 5–15)
BUN: 29 mg/dL — ABNORMAL HIGH (ref 6–20)
CO2: 25 mmol/L (ref 22–32)
Calcium: 6.7 mg/dL — ABNORMAL LOW (ref 8.9–10.3)
Chloride: 104 mmol/L (ref 98–111)
Creatinine, Ser: 1.02 mg/dL — ABNORMAL HIGH (ref 0.44–1.00)
GFR calc Af Amer: >60 mL/min (ref >60)
GFR calc non Af Amer: >60 mL/min (ref >60)
Glucose, Bld: 123 mg/dL — ABNORMAL HIGH (ref 70–99)
Potassium: 4.2 mmol/L (ref 3.5–5.1)
Sodium: 137 mmol/L (ref 135–145)

## 2018-08-01 LAB — URINE CULTURE: Culture: NO GROWTH

## 2018-08-01 LAB — GLUCOSE, CAPILLARY
Glucose-Capillary: 103 mg/dL — ABNORMAL HIGH (ref 70–99)
Glucose-Capillary: 116 mg/dL — ABNORMAL HIGH (ref 70–99)
Glucose-Capillary: 149 mg/dL — ABNORMAL HIGH (ref 70–99)
Glucose-Capillary: 95 mg/dL (ref 70–99)
Glucose-Capillary: 107 mg/dL — ABNORMAL HIGH (ref 70–99)

## 2018-08-01 LAB — MRSA PCR SCREENING: MRSA by PCR: NEGATIVE

## 2018-08-01 LAB — PROCALCITONIN: Procalcitonin: <0.1 ng/mL

## 2018-08-01 LAB — LACTIC ACID, PLASMA: Lactic Acid, Venous: 0.7 mmol/L (ref 0.5–1.9)

## 2018-08-01 MED ORDER — ONDANSETRON HCL 4 MG/2ML IJ SOLN: 4.0000 mg | Freq: Four times a day (QID) | INTRAMUSCULAR | Status: DC | PRN

## 2018-08-01 MED ORDER — ONDANSETRON HCL 4 MG PO TABS: 4.0000 mg | ORAL_TABLET | Freq: Four times a day (QID) | ORAL | Status: DC | PRN

## 2018-08-01 MED ORDER — SODIUM CHLORIDE 0.9 % IV SOLN
INTRAVENOUS | Status: AC
  Administered 2018-08-01: 01:00:00 via INTRAVENOUS

## 2018-08-01 MED ORDER — INSULIN ASPART 100 UNIT/ML ~~LOC~~ SOLN: 0.0000 [IU] | Freq: Every day | SUBCUTANEOUS | Status: DC

## 2018-08-01 MED ORDER — LAMOTRIGINE 25 MG PO TABS
25.0000 mg | ORAL_TABLET | Freq: Two times a day (BID) | ORAL | Status: DC
  Administered 2018-08-01 – 2018-08-04 (×6): 25 mg via ORAL
  Filled 2018-08-01 (×6): qty 1

## 2018-08-01 MED ORDER — INSULIN ASPART 100 UNIT/ML ~~LOC~~ SOLN
0.0000 [IU] | Freq: Three times a day (TID) | SUBCUTANEOUS | Status: DC
  Administered 2018-08-02 (×2): 1 [IU] via SUBCUTANEOUS
  Administered 2018-08-02 – 2018-08-03 (×2): 2 [IU] via SUBCUTANEOUS
  Administered 2018-08-04: 08:00:00 1 [IU] via SUBCUTANEOUS
  Filled 2018-08-01 (×6): qty 1

## 2018-08-01 MED ORDER — ACETAMINOPHEN 650 MG RE SUPP: 650.0000 mg | Freq: Four times a day (QID) | RECTAL | Status: DC | PRN

## 2018-08-01 MED ORDER — ACETAMINOPHEN 325 MG PO TABS: 650.0000 mg | ORAL_TABLET | Freq: Four times a day (QID) | ORAL | Status: DC | PRN

## 2018-08-01 NOTE — ED Notes (Signed)
RN on 1c will return call to bring pt to the floor when they have a sitter available.
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
## 2018-08-01 NOTE — Progress Notes (Signed)
[facility] NAME: Jessica [name]    MR#:  [id number]  DATE OF BIRTH:  [birth year]  SUBJECTIVE:  CHIEF COMPLAINT:   Chief Complaint  Patient presents with  . Aggressive Behavior   Confused, drowzy. Sitter at bedside  REVIEW OF SYSTEMS:    Review of Systems  Unable to perform ROS: Mental status change    DRUG ALLERGIES:   Allergies  Allergen Reactions  . Abilify [Aripiprazole] Other (See Comments)    Chokes on food  . Haldol [Haloperidol] Other (See Comments)    Dizziness  . Risperdal [Risperidone] Other (See Comments)    Leg weakness    VITALS:  Blood pressure (!) 111/58, pulse 83, temperature 98 F (36.7 C), temperature source Axillary, resp. rate 16, height 4\' 10"  (1.473 m), weight 79.4 kg, SpO2 100 %.  PHYSICAL EXAMINATION:   Physical Exam  GENERAL:  53 y.o.-year-old patient lying in the bed with no acute distress.  EYES: Pupils equal, round, reactive to light and accommodation. No scleral icterus. Extraocular muscles intact.  HEENT: Head atraumatic, normocephalic. Oropharynx and nasopharynx clear.  NECK:  Supple, no jugular venous distention. No thyroid enlargement, no tenderness.  LUNGS: Normal breath sounds bilaterally, no wheezing, rales, rhonchi. No use of accessory muscles of respiration.  CARDIOVASCULAR: S1, S2 normal. No murmurs, rubs, or gallops.  ABDOMEN: Soft, nontender, nondistended. Bowel sounds present. No organomegaly or mass.  EXTREMITIES: No cyanosis, clubbing or edema b/l.    NEUROLOGIC: Cranial nerves II through XII are intact. No focal Motor or sensory deficits b/l.   PSYCHIATRIC: The patient is confused SKIN: No obvious rash, lesion, or ulcer.   LABORATORY PANEL:   CBC Recent Labs  Lab 08/01/18 0526  WBC 4.3  HGB 10.8*  HCT 33.7*  PLT 35*   ------------------------------------------------------------------------------------------------------------------ Chemistries  Recent Labs   Lab 07/31/18 2103 08/01/18 0526  NA 139 137  K 5.1 4.2  CL 99 104  CO2 28 25  GLUCOSE 106* 123*  BUN 32* 29*  CREATININE 1.53* 1.02*  CALCIUM 7.4* 6.7*  AST 24  --   ALT 14  --   ALKPHOS 51  --   BILITOT 0.5  --    ------------------------------------------------------------------------------------------------------------------  Cardiac Enzymes No results for input(s): TROPONINI in the last 168 hours. ------------------------------------------------------------------------------------------------------------------  RADIOLOGY:  Ct Head Wo Contrast  Result Date: 07/30/2018 CLINICAL DATA:  Altered level of consciousness, right orbital cellulitis EXAM: CT HEAD WITHOUT CONTRAST CT ORBITS WITH CONTRAST TECHNIQUE: Multidetector CT imaging of the head was performed following the standard protocol before bolus injection of intravenous contrast. Multidetector CT imaging of the orbits were performed following the standard protocol during bolus administration of intravenous contrast. CONTRAST:  29mL OMNIPAQUE IOHEXOL 300 MG/ML  SOLN COMPARISON:  None. FINDINGS: CT HEAD FINDINGS Brain: No evidence of acute infarction, hemorrhage, hydrocephalus, extra-axial collection or mass lesion/mass effect. Extensive periventricular and deep white matter hypodensity. Incidental note of cavum septum pellucidum variant of the lateral ventricles. Vascular: No hyperdense vessel or unexpected calcification. Skull: Normal. Negative for fracture or focal lesion. Other: None. CT ORBITS FINDINGS Orbits: There is superficial soft tissue edema of the right palpebra. Globes, optic nerves, orbital fat, extraocular muscles, vascular structures, and lacrimal glands are normal. Visualized sinuses: Clear. Soft tissues: Negative. IMPRESSION: 1. No acute intracranial pathology. There is extensive periventricular and deep white matter hypodensity, very advanced for patient age, and possibly reflecting demyelinating disorder such as  multiple sclerosis. Advanced small-vessel white matter disease is  a differential consideration. 2. There is superficial soft tissue edema of the right palpebra. Findings are consistent with preseptal cellulitis without abnormality of the orbital contents. No evidence of abscess. Electronically Signed   By: Eddie Candle M.D.   On: 07/30/2018 13:46   Ct Angio Chest Pe W And/or Wo Contrast  Result Date: 07/31/2018 CLINICAL DATA:  Shortness of breath and hypoxia EXAM: CT ANGIOGRAPHY CHEST WITH CONTRAST TECHNIQUE: Multidetector CT imaging of the chest was performed using the standard protocol during bolus administration of intravenous contrast. Multiplanar CT image reconstructions and MIPs were obtained to evaluate the vascular anatomy. CONTRAST:  66mL OMNIPAQUE IOHEXOL 350 MG/ML SOLN COMPARISON:  Plain film from earlier in the same day. FINDINGS: Cardiovascular: Thoracic aorta demonstrates atherosclerotic calcifications without aneurysmal dilatation or dissection. No cardiac enlargement is seen. The pulmonary artery shows a normal branching pattern. No filling defects are noted to suggest pulmonary embolism. No significant coronary calcifications are noted. Mediastinum/Nodes: Thoracic inlet is within normal limits. No hilar or mediastinal adenopathy is noted. The esophagus is within normal limits. Lungs/Pleura: Mild bibasilar atelectasis is noted. No sizable effusion is seen. No parenchymal nodules are noted. Some changes of air trapping are noted. Upper Abdomen: Visualized upper abdomen is within normal limits. Musculoskeletal: Degenerative changes of the thoracic spine are noted. No rib abnormality is noted. Review of the MIP images confirms the above findings. IMPRESSION: No evidence of pulmonary emboli. Mild atelectasis bilaterally. No other focal abnormality is noted. Aortic Atherosclerosis (ICD10-I70.0). Electronically Signed   By: Inez Catalina M.D.   On: 07/31/2018 21:54   Dg Chest Portable 1 View  Result  Date: 07/31/2018 CLINICAL DATA:  Cough. Former smoker. History of diabetes and hypertension. EXAM: PORTABLE CHEST 1 VIEW COMPARISON:  10/07/2016 FINDINGS: Cardiac silhouette top-normal in size. No mediastinal or hilar masses or convincing adenopathy. Clear lungs.  No pleural effusion or pneumothorax. Skeletal structures are grossly intact. IMPRESSION: No active disease. Electronically Signed   By: Lajean Manes M.D.   On: 07/31/2018 07:52   Ct Orbits W Contrast  Result Date: 07/30/2018 CLINICAL DATA:  Altered level of consciousness, right orbital cellulitis EXAM: CT HEAD WITHOUT CONTRAST CT ORBITS WITH CONTRAST TECHNIQUE: Multidetector CT imaging of the head was performed following the standard protocol before bolus injection of intravenous contrast. Multidetector CT imaging of the orbits were performed following the standard protocol during bolus administration of intravenous contrast. CONTRAST:  23mL OMNIPAQUE IOHEXOL 300 MG/ML  SOLN COMPARISON:  None. FINDINGS: CT HEAD FINDINGS Brain: No evidence of acute infarction, hemorrhage, hydrocephalus, extra-axial collection or mass lesion/mass effect. Extensive periventricular and deep white matter hypodensity. Incidental note of cavum septum pellucidum variant of the lateral ventricles. Vascular: No hyperdense vessel or unexpected calcification. Skull: Normal. Negative for fracture or focal lesion. Other: None. CT ORBITS FINDINGS Orbits: There is superficial soft tissue edema of the right palpebra. Globes, optic nerves, orbital fat, extraocular muscles, vascular structures, and lacrimal glands are normal. Visualized sinuses: Clear. Soft tissues: Negative. IMPRESSION: 1. No acute intracranial pathology. There is extensive periventricular and deep white matter hypodensity, very advanced for patient age, and possibly reflecting demyelinating disorder such as multiple sclerosis. Advanced small-vessel white matter disease is a differential consideration. 2. There is  superficial soft tissue edema of the right palpebra. Findings are consistent with preseptal cellulitis without abnormality of the orbital contents. No evidence of abscess. Electronically Signed   By: Eddie Candle M.D.   On: 07/30/2018 13:46     ASSESSMENT AND PLAN:   *  Hypoxia More likely due to drowsiness and atelectasis. Wean O2     Schizoaffective disorder, bipolar type  -psychiatry is managing the patient's medications and treatment for this problem.  Continue to defer to their recommendations    Diabetes mellitus without complication-glucose levels relatively stable, sliding scale insulin coverage ordered    HTN (hypertension) -hold antihypertensives for now  IVF    GERD (gastroesophageal reflux disease) -continue home dose PPI    Hypothyroidism -home dose thyroid replacement  Right conjuctivitis On [place] drops  chronic thrombocytopenia NO bleeding repeat labs in AM  All the records are reviewed and case discussed with Care Management/Social Worker Management plans discussed with the patient, family and they are in agreement.  CODE STATUS: FULL CODE  TOTAL TIME TAKING CARE OF THIS PATIENT: 35 minutes.   POSSIBLE D/C IN 1-2 DAYS, DEPENDING ON CLINICAL CONDITION.  Neita Carp M.D on 08/01/2018 at 12:21 PM  Between 7am to 6pm - Pager - [phone]  After 6pm go to [email or web address] - password [email or web address] [facility]  Office  [phone]  CC: Primary care physician; System, Pcp Not In  Note: This dictation was prepared with Dragon dictation along with smaller phrase technology. Any transcriptional errors that result from this process are unintentional.

## 2018-08-01 NOTE — TOC Progression Note (Signed)
Transition of Care [facility]) - Progression Note    Patient Details  Name: Jessica [name] MRN: [id number] Date of Birth: [birth year]  [facility]) CM/SW Contact  Shelbie Hutching, RN Phone Number: 08/01/2018, 12:32 PM  Clinical Narrative:    Patient remains under IVC.  Patient has a guardianship court hearing scheduled for tomorrow.  Guardian ad Litem has been appointment and came to assess patient today, he will be present on her behalf at the hearing tomorrow.  Per bedside RN patient is refusing her medications and she can be heard in the hallway outside her room yelling and cursing intermittently throughout the morning.     Expected Discharge Plan: Group Home Barriers to Discharge: Barriers Unresolved (comment)(patient behaviors- unacceptable verbal slurs)  Expected Discharge Plan and Services Expected Discharge Plan: Group Home                                               Social Determinants of Health (SDOH) Interventions    Readmission Risk Interventions No flowsheet data found.

## 2018-08-01 NOTE — Progress Notes (Signed)
Pt is refusing all medications including IV fluids. She is also refusing to wear her telemetry monitor. MD notified

## 2018-08-02 DIAGNOSIS — R0902 Hypoxemia: Secondary | ICD-10-CM

## 2018-08-02 DIAGNOSIS — R4182 Altered mental status, unspecified: Secondary | ICD-10-CM

## 2018-08-02 DIAGNOSIS — B1920 Unspecified viral hepatitis C without hepatic coma: Secondary | ICD-10-CM

## 2018-08-02 DIAGNOSIS — I959 Hypotension, unspecified: Secondary | ICD-10-CM

## 2018-08-02 DIAGNOSIS — F259 Schizoaffective disorder, unspecified: Secondary | ICD-10-CM

## 2018-08-02 DIAGNOSIS — R161 Splenomegaly, not elsewhere classified: Secondary | ICD-10-CM

## 2018-08-02 DIAGNOSIS — D696 Thrombocytopenia, unspecified: Secondary | ICD-10-CM

## 2018-08-02 LAB — GLUCOSE, CAPILLARY
Glucose-Capillary: 131 mg/dL — ABNORMAL HIGH (ref 70–99)
Glucose-Capillary: 137 mg/dL — ABNORMAL HIGH (ref 70–99)
Glucose-Capillary: 151 mg/dL — ABNORMAL HIGH (ref 70–99)
Glucose-Capillary: 119 mg/dL — ABNORMAL HIGH (ref 70–99)

## 2018-08-02 LAB — FOLATE: Folate: 14.7 ng/mL (ref >5.9)

## 2018-08-02 LAB — CBC WITH DIFFERENTIAL/PLATELET
Abs Immature Granulocytes: 0.03 10*3/uL (ref 0.00–0.07)
Basophils Absolute: 0 10*3/uL (ref 0.0–0.1)
Basophils Relative: 1 %
Eosinophils Absolute: 0 10*3/uL (ref 0.0–0.5)
Eosinophils Relative: 0 %
HCT: 30.7 % — ABNORMAL LOW (ref 36.0–46.0)
Hemoglobin: 10.3 g/dL — ABNORMAL LOW (ref 12.0–15.0)
Immature Granulocytes: 1 %
Lymphocytes Relative: 36 %
Lymphs Abs: 1.3 10*3/uL (ref 0.7–4.0)
MCH: 31.3 pg (ref 26.0–34.0)
MCHC: 33.6 g/dL (ref 30.0–36.0)
MCV: 93.3 fL (ref 80.0–100.0)
Monocytes Absolute: 0.6 10*3/uL (ref 0.1–1.0)
Monocytes Relative: 16 %
Neutro Abs: 1.7 10*3/uL (ref 1.7–7.7)
Neutrophils Relative %: 46 %
Platelets: 36 10*3/uL — ABNORMAL LOW (ref 150–400)
RBC: 3.29 MIL/uL — ABNORMAL LOW (ref 3.87–5.11)
RDW: 12.7 % (ref 11.5–15.5)
WBC: 3.7 10*3/uL — ABNORMAL LOW (ref 4.0–10.5)
nRBC: 0 % (ref 0.0–0.2)

## 2018-08-02 LAB — VITAMIN B12: Vitamin B-12: 640 pg/mL (ref 180–914)

## 2018-08-02 LAB — HIV ANTIBODY (ROUTINE TESTING W REFLEX): HIV Screen 4th Generation wRfx: NONREACTIVE

## 2018-08-02 MED ORDER — LAMOTRIGINE 25 MG PO TABS: 25.0000 mg | ORAL_TABLET | Freq: Two times a day (BID) | ORAL | 0 refills | Status: DC

## 2018-08-02 MED ORDER — ZIPRASIDONE MESYLATE 20 MG IM SOLR
20.0000 mg | Freq: Once | INTRAMUSCULAR | Status: AC
  Administered 2018-08-02: 05:00:00 20 mg via INTRAMUSCULAR
  Filled 2018-08-02: qty 20

## 2018-08-02 MED ORDER — LORAZEPAM 2 MG/ML IJ SOLN
1.0000 mg | Freq: Once | INTRAMUSCULAR | Status: AC
  Administered 2018-08-02: 1 mg via INTRAVENOUS
  Filled 2018-08-02: qty 1

## 2018-08-02 MED ORDER — PALIPERIDONE ER 3 MG PO TB24: 3.0000 mg | ORAL_TABLET | Freq: Every day | ORAL | 0 refills | Status: DC

## 2018-08-02 MED ORDER — DIVALPROEX SODIUM 250 MG PO DR TAB: 250.0000 mg | DELAYED_RELEASE_TABLET | Freq: Two times a day (BID) | ORAL | 0 refills | Status: DC

## 2018-08-02 MED ORDER — LORAZEPAM 2 MG/ML IJ SOLN
2.0000 mg | Freq: Four times a day (QID) | INTRAMUSCULAR | Status: DC | PRN
  Administered 2018-08-02 – 2018-08-04 (×5): 2 mg via INTRAVENOUS
  Filled 2018-08-02 (×5): qty 1

## 2018-08-02 MED ORDER — MOXIFLOXACIN HCL 0.5 % OP SOLN: 1.0000 [drp] | Freq: Three times a day (TID) | OPHTHALMIC | 0 refills | Status: DC

## 2018-08-02 NOTE — Discharge Instructions (Signed)
Resume diet and activity as before  Follow-up with your psychiatrist in 1 week

## 2018-08-02 NOTE — Consult Note (Signed)
Hematology/Oncology Consult note [facility] [phone] [phone]  Patient Care Team: System, Pcp Not In as PCP - General   Name of the patient: Jessica [name]  [id number]  10/17/1965    Reason for consult: Thrombocytopenia   Requesting physician: Dr. Darvin Neighbours  Date of visit: 08/02/2018    History of presenting illness- Patient is a 53 year old female with a past medical history significant for anxiety depression and homicidal ideation.  She also has a history of hepatitis C and has had hepatosplenomegaly in the past.  At baseline patient has thrombocytopenia and a platelet count is running in the 70s.  Patient was admitted to the hospital with symptoms of lethargy.  Psychiatry is also on board and has been changing her medications.  Patient is taking Depakote, Cymbalta and Lamictal as an outpatient.  Her Cymbalta was discontinued as an inpatient.  Hematology consulted for thrombocytopenia.  Her platelet count on admission was 56 and trended down to 35 yesterday and 36 today.  Hemoglobin 10.8.  Patient is currently sitting up in her chair and appears oriented to self.  She denies any complaints.  Denies any bleeding or bruising.     Review of systems- Review of Systems  Constitutional: Positive for malaise/fatigue. Negative for chills, fever and weight loss.  HENT: Negative for congestion, ear discharge and nosebleeds.   Eyes: Negative for blurred vision.  Respiratory: Negative for cough, hemoptysis, sputum production, shortness of breath and wheezing.   Cardiovascular: Negative for chest pain, palpitations, orthopnea and claudication.  Gastrointestinal: Negative for abdominal pain, blood in stool, constipation, diarrhea, heartburn, melena, nausea and vomiting.  Genitourinary: Negative for dysuria, flank pain, frequency, hematuria and urgency.  Musculoskeletal: Negative for back pain, joint pain and myalgias.  Skin: Negative for rash.    Neurological: Negative for dizziness, tingling, focal weakness, seizures, weakness and headaches.  Endo/Heme/Allergies: Does not bruise/bleed easily.  Psychiatric/Behavioral: Negative for depression and suicidal ideas. The patient does not have insomnia.     Allergies  Allergen Reactions   Abilify [Aripiprazole] Other (See Comments)    Chokes on food   Haldol [Haloperidol] Other (See Comments)    Dizziness   Risperdal [Risperidone] Other (See Comments)    Leg weakness    Patient Active Problem List   Diagnosis Date Noted   GERD (gastroesophageal reflux disease) 07/31/2018   Hypothyroidism 07/31/2018   Hypoxia 07/31/2018   Homicidal ideation 06/15/2018   Schizoaffective disorder, bipolar type ([place]) 06/15/2018   Homicidal thoughts 06/15/2018   Borderline personality disorder ([place]) 06/05/2018   Agitation 02/21/2018   Intellectual disability 12/27/2017   Diabetes mellitus without complication ([place]) 42/59/5638   Atypical glandular cells of undetermined significance (AGUS) on cervical Pap smear 06/09/2016   HTN (hypertension) 07/11/2013     Past Medical History:  Diagnosis Date   Anxiety    Depression    Diabetes mellitus without complication (H[facility])    GERD (gastroesophageal reflux disease)    Homicidal ideation    Hypertension    Hypothyroidism      Past Surgical History:  Procedure Laterality Date   COLPOSCOPY     FRACTURE SURGERY     TRACHEOSTOMY      Social History   Socioeconomic History   Marital status: Single    Spouse name: Not on file   Number of children: Not on file   Years of education: Not on file   Highest education level: Not on file  Occupational History   Not on file  Social  Needs   Financial resource strain: Somewhat hard   Food insecurity    Worry: Patient refused    Inability: Patient refused   Transportation needs    Medical: Patient refused    Non-medical: Patient refused  Tobacco Use   Smoking  status: Former Smoker   Smokeless tobacco: Never Used  Substance and Sexual Activity   Alcohol use: No    Frequency: Never   Drug use: Yes    Types: Marijuana, "Crack" cocaine    Comment: reports she has not done anything in a long time   Sexual activity: Not Currently    Birth control/protection: Abstinence  Lifestyle   Physical activity    Days per week: Patient refused    Minutes per session: Patient refused   Stress: Rather much  Relationships   Social connections    Talks on phone: Patient refused    Gets together: Patient refused    Attends religious service: Patient refused    Active member of club or organization: Patient refused    Attends meetings of clubs or organizations: Patient refused    Relationship status: Patient refused   Intimate partner violence    Fear of current or ex partner: Patient refused    Emotionally abused: Patient refused    Physically abused: Patient refused    Forced sexual activity: Patient refused  Other Topics Concern   Not on file  Social History Narrative   Not on file     History reviewed. No pertinent family history.   Current Facility-Administered Medications:    acetaminophen (TYLENOL) tablet 650 mg, 650 mg, Oral, Q6H PRN **OR** acetaminophen (TYLENOL) suppository 650 mg, 650 mg, Rectal, Q6H PRN, Lance Coon, MD   atorvastatin (LIPITOR) tablet 10 mg, 10 mg, Oral, Daily, Lance Coon, MD, 10 mg at 08/02/18 0735   benztropine (COGENTIN) tablet 0.5 mg, 0.5 mg, Oral, BID PRN, Lance Coon, MD   divalproex (DEPAKOTE) DR tablet 250 mg, 250 mg, Oral, Q1400, Lance Coon, MD, 250 mg at 08/02/18 1238   divalproex (DEPAKOTE) DR tablet 500 mg, 500 mg, Oral, Q12H, Lance Coon, MD, 500 mg at 08/02/18 2863   ferrous sulfate tablet 325 mg, 325 mg, Oral, BID, Lance Coon, MD, 325 mg at 08/02/18 0736   gabapentin (NEURONTIN) capsule 200 mg, 200 mg, Oral, TID, Lance Coon, MD, 200 mg at 08/02/18 0736   insulin aspart  (novoLOG) injection 0-5 Units, 0-5 Units, Subcutaneous, QHS, Lance Coon, MD   insulin aspart (novoLOG) injection 0-9 Units, 0-9 Units, Subcutaneous, TID WC, Lance Coon, MD, 1 Units at 08/02/18 1237   lamoTRIgine (LAMICTAL) tablet 25 mg, 25 mg, Oral, BID, Ravi, Himabindu, MD, 25 mg at 08/02/18 0736   levothyroxine (SYNTHROID) tablet 150 mcg, 150 mcg, Oral, Q0600, Lance Coon, MD, 150 mcg at 08/02/18 0500   moxifloxacin (VIGAMOX) 0.5 % ophthalmic solution 1 drop, 1 drop, Right Eye, TID, Lance Coon, MD, 1 drop at 08/02/18 1238   ondansetron (ZOFRAN) tablet 4 mg, 4 mg, Oral, Q6H PRN **OR** ondansetron (ZOFRAN) injection 4 mg, 4 mg, Intravenous, Q6H PRN, Lance Coon, MD   paliperidone (INVEGA) 24 hr tablet 3 mg, 3 mg, Oral, Daily, Lance Coon, MD, 3 mg at 08/02/18 0735   pantoprazole (PROTONIX) EC tablet 40 mg, 40 mg, Oral, Daily, Lance Coon, MD, 40 mg at 08/02/18 0736   traZODone (DESYREL) tablet 100 mg, 100 mg, Oral, Corwin Levins, MD, 100 mg at 08/01/18 2139   Physical exam:  Vitals:   08/01/18 0000 08/01/18 0030  08/01/18 0050 08/01/18 2106  BP: (!) 87/54 112/65 (!) 111/58 (!) 143/84  Pulse: 83 83 83 83  Resp: 17 12 16 20   Temp:   98 F (36.7 C) 98.3 F (36.8 C)  TempSrc:   Axillary Oral  SpO2: 100% 100% 100% 96%  Weight:   175 lb 0.7 oz (79.4 kg)   Height:   4\' 10"  (1.473 m)    Physical Exam Constitutional:      General: She is not in acute distress.    Comments: She is sitting up in her chair and having her lunch  HENT:     Head: Normocephalic and atraumatic.  Eyes:     Pupils: Pupils are equal, round, and reactive to light.  Neck:     Musculoskeletal: Normal range of motion.  Cardiovascular:     Rate and Rhythm: Normal rate and regular rhythm.     Heart sounds: Normal heart sounds.  Pulmonary:     Effort: Pulmonary effort is normal.     Breath sounds: Normal breath sounds.  Abdominal:     General: Bowel sounds are normal.     Palpations:  Abdomen is soft.  Skin:    General: Skin is warm and dry.  Neurological:     Mental Status: She is alert.     Comments: Oriented to self and place        CMP Latest Ref Rng & Units 08/01/2018  Glucose 70 - 99 mg/dL 123(H)  BUN 6 - 20 mg/dL 29(H)  Creatinine 0.44 - 1.00 mg/dL 1.02(H)  Sodium 135 - 145 mmol/L 137  Potassium 3.5 - 5.1 mmol/L 4.2  Chloride 98 - 111 mmol/L 104  CO2 22 - 32 mmol/L 25  Calcium 8.9 - 10.3 mg/dL 6.7(L)  Total Protein 6.5 - 8.1 g/dL -  Total Bilirubin 0.3 - 1.2 mg/dL -  Alkaline Phos 38 - 126 U/L -  AST 15 - 41 U/L -  ALT 0 - 44 U/L -   CBC Latest Ref Rng & Units 08/02/2018  WBC 4.0 - 10.5 K/uL 3.7(L)  Hemoglobin 12.0 - 15.0 g/dL 10.3(L)  Hematocrit 36.0 - 46.0 % 30.7(L)  Platelets 150 - 400 K/uL 36(L)    @IMAGES @  Ct Head Wo Contrast  Result Date: 07/30/2018 CLINICAL DATA:  Altered level of consciousness, right orbital cellulitis EXAM: CT HEAD WITHOUT CONTRAST CT ORBITS WITH CONTRAST TECHNIQUE: Multidetector CT imaging of the head was performed following the standard protocol before bolus injection of intravenous contrast. Multidetector CT imaging of the orbits were performed following the standard protocol during bolus administration of intravenous contrast. CONTRAST:  93mL OMNIPAQUE IOHEXOL 300 MG/ML  SOLN COMPARISON:  None. FINDINGS: CT HEAD FINDINGS Brain: No evidence of acute infarction, hemorrhage, hydrocephalus, extra-axial collection or mass lesion/mass effect. Extensive periventricular and deep white matter hypodensity. Incidental note of cavum septum pellucidum variant of the lateral ventricles. Vascular: No hyperdense vessel or unexpected calcification. Skull: Normal. Negative for fracture or focal lesion. Other: None. CT ORBITS FINDINGS Orbits: There is superficial soft tissue edema of the right palpebra. Globes, optic nerves, orbital fat, extraocular muscles, vascular structures, and lacrimal glands are normal. Visualized sinuses: Clear. Soft  tissues: Negative. IMPRESSION: 1. No acute intracranial pathology. There is extensive periventricular and deep white matter hypodensity, very advanced for patient age, and possibly reflecting demyelinating disorder such as multiple sclerosis. Advanced small-vessel white matter disease is a differential consideration. 2. There is superficial soft tissue edema of the right palpebra. Findings are consistent with preseptal  cellulitis without abnormality of the orbital contents. No evidence of abscess. Electronically Signed   By: Eddie Candle M.D.   On: 07/30/2018 13:46   Ct Angio Chest Pe W And/or Wo Contrast  Result Date: 07/31/2018 CLINICAL DATA:  Shortness of breath and hypoxia EXAM: CT ANGIOGRAPHY CHEST WITH CONTRAST TECHNIQUE: Multidetector CT imaging of the chest was performed using the standard protocol during bolus administration of intravenous contrast. Multiplanar CT image reconstructions and MIPs were obtained to evaluate the vascular anatomy. CONTRAST:  4mL OMNIPAQUE IOHEXOL 350 MG/ML SOLN COMPARISON:  Plain film from earlier in the same day. FINDINGS: Cardiovascular: Thoracic aorta demonstrates atherosclerotic calcifications without aneurysmal dilatation or dissection. No cardiac enlargement is seen. The pulmonary artery shows a normal branching pattern. No filling defects are noted to suggest pulmonary embolism. No significant coronary calcifications are noted. Mediastinum/Nodes: Thoracic inlet is within normal limits. No hilar or mediastinal adenopathy is noted. The esophagus is within normal limits. Lungs/Pleura: Mild bibasilar atelectasis is noted. No sizable effusion is seen. No parenchymal nodules are noted. Some changes of air trapping are noted. Upper Abdomen: Visualized upper abdomen is within normal limits. Musculoskeletal: Degenerative changes of the thoracic spine are noted. No rib abnormality is noted. Review of the MIP images confirms the above findings. IMPRESSION: No evidence of  pulmonary emboli. Mild atelectasis bilaterally. No other focal abnormality is noted. Aortic Atherosclerosis (ICD10-I70.0). Electronically Signed   By: Inez Catalina M.D.   On: 07/31/2018 21:54   Dg Chest Portable 1 View  Result Date: 07/31/2018 CLINICAL DATA:  Cough. Former smoker. History of diabetes and hypertension. EXAM: PORTABLE CHEST 1 VIEW COMPARISON:  10/07/2016 FINDINGS: Cardiac silhouette top-normal in size. No mediastinal or hilar masses or convincing adenopathy. Clear lungs.  No pleural effusion or pneumothorax. Skeletal structures are grossly intact. IMPRESSION: No active disease. Electronically Signed   By: Lajean Manes M.D.   On: 07/31/2018 07:52   Ct Orbits W Contrast  Result Date: 07/30/2018 CLINICAL DATA:  Altered level of consciousness, right orbital cellulitis EXAM: CT HEAD WITHOUT CONTRAST CT ORBITS WITH CONTRAST TECHNIQUE: Multidetector CT imaging of the head was performed following the standard protocol before bolus injection of intravenous contrast. Multidetector CT imaging of the orbits were performed following the standard protocol during bolus administration of intravenous contrast. CONTRAST:  59mL OMNIPAQUE IOHEXOL 300 MG/ML  SOLN COMPARISON:  None. FINDINGS: CT HEAD FINDINGS Brain: No evidence of acute infarction, hemorrhage, hydrocephalus, extra-axial collection or mass lesion/mass effect. Extensive periventricular and deep white matter hypodensity. Incidental note of cavum septum pellucidum variant of the lateral ventricles. Vascular: No hyperdense vessel or unexpected calcification. Skull: Normal. Negative for fracture or focal lesion. Other: None. CT ORBITS FINDINGS Orbits: There is superficial soft tissue edema of the right palpebra. Globes, optic nerves, orbital fat, extraocular muscles, vascular structures, and lacrimal glands are normal. Visualized sinuses: Clear. Soft tissues: Negative. IMPRESSION: 1. No acute intracranial pathology. There is extensive periventricular  and deep white matter hypodensity, very advanced for patient age, and possibly reflecting demyelinating disorder such as multiple sclerosis. Advanced small-vessel white matter disease is a differential consideration. 2. There is superficial soft tissue edema of the right palpebra. Findings are consistent with preseptal cellulitis without abnormality of the orbital contents. No evidence of abscess. Electronically Signed   By: Eddie Candle M.D.   On: 07/30/2018 13:46    Assessment and plan- Patient is a 53 y.o. female with history of schizoaffective disorder bipolar type and history of hepatitis C admitted for altered mental status  found to have thrombocytopenia  Patient's baseline platelet count is typically in the 70s likely secondary to hep C/splenomegaly.  It is acutely lowered to 35.  I did review her peripheral smear and there is evidence of moderate thrombocytopenia and no evidence of schistocytes.  B12 and folate levels are normal.  PT PTT INR is normal.  Etiology of thrombocytopenia is unclear.  I have reviewed her medication list as well and patient was taking Depakote Lamictal and Cymbalta as an outpatient.  The Cymbalta has been discontinued as an inpatient but I do not see any other new medications on her list that would explain her thrombocytopenia.  She was also not exposed to heparin.  At this time I would recommend continue to monitor her platelet counts and if they are stable she can be discharged and I will follow-up with her as an outpatient     Visit Diagnosis 1. Aggressive behavior   2. Preseptal cellulitis   3. Weakness   4. Hypotension, unspecified hypotension type   5. Hypoxia     Dr. Randa Evens, MD, MPH [facility] [phone] 08/02/2018
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
## 2018-08-02 NOTE — Progress Notes (Signed)
Patient crying because her group home won't come pick her.  Made her aware that she does not have any discharge orders.  Patient yelling out at times but then apologizing for behavior.  Paged MD on call, verbal order from Dr. Posey Pronto for ativan 2mg  IV q 6 hrs PRN.  Clarise Cruz, BSN

## 2018-08-02 NOTE — Progress Notes (Signed)
Patient will not stay in bed she is unsteady on her feet. She said that she is angry because of the noise in the hallway and she doesnt want to do anything bad I asked her what she meant and she said , "act out" Messaged MD, new order for safety sitter.

## 2018-08-02 NOTE — Care Management (Signed)
Message left for Jessica [name] with [facility] [phone] ext. [phone] fax [phone] for follow up as I have not heard back from them.

## 2018-08-02 NOTE — TOC Progression Note (Signed)
Transition of Care [facility]) - Progression Note    Patient Details  Name: Jessica [name] MRN: [id number] Date of Birth: Jan 26, 1965  Transition of [facility]) CM/SW Contact  Shelbie Hutching, RN Phone Number: 08/02/2018, 2:08 PM  Clinical Narrative:    Message left with Group home owner [name].  MD feels that patient is medically stable for discharge and patient is no longer under IVC.     Expected Discharge Plan: Group Home Barriers to Discharge: Barriers Unresolved (comment)(patient behaviors- unacceptable verbal slurs)  Expected Discharge Plan and Services Expected Discharge Plan: Group Home                                               Social Determinants of Health (SDOH) Interventions    Readmission Risk Interventions No flowsheet data found.

## 2018-08-02 NOTE — Progress Notes (Signed)
[place] at [place] NAME: Jessica [name]    MR#:  [id number]  DATE OF BIRTH:  04/11/1965  SUBJECTIVE:  CHIEF COMPLAINT:   Chief Complaint  Patient presents with  . Aggressive Behavior   Ambulating in the room.  Sitting at the side of the bed.  More awake and alert.  Sitter at bedside  REVIEW OF SYSTEMS:    Review of Systems  Unable to perform ROS: Mental status change    DRUG ALLERGIES:   Allergies  Allergen Reactions  . Abilify [Aripiprazole] Other (See Comments)    Chokes on food  . Haldol [Haloperidol] Other (See Comments)    Dizziness  . Risperdal [Risperidone] Other (See Comments)    Leg weakness    VITALS:  Blood pressure 139/77, pulse 92, temperature 98.4 F (36.9 C), temperature source Oral, resp. rate 20, height 4\' 10"  (1.473 m), weight 79.4 kg, SpO2 96 %.  PHYSICAL EXAMINATION:   Physical Exam  GENERAL:  53 y.o.-year-old patient lying in the bed with no acute distress.  EYES: Pupils equal, round, reactive to light and accommodation. No scleral icterus. Extraocular muscles intact.  HEENT: Head atraumatic, normocephalic. Oropharynx and nasopharynx clear.  NECK:  Supple, no jugular venous distention. No thyroid enlargement, no tenderness.  LUNGS: Normal breath sounds bilaterally, no wheezing, rales, rhonchi. No use of accessory muscles of respiration.  CARDIOVASCULAR: S1, S2 normal. No murmurs, rubs, or gallops.  ABDOMEN: Soft, nontender, nondistended. Bowel sounds present. No organomegaly or mass.  EXTREMITIES: No cyanosis, clubbing or edema b/l.    NEUROLOGIC: Cranial nerves II through XII are intact. No focal Motor or sensory deficits b/l.   PSYCHIATRIC: The patient is confused SKIN: No obvious rash, lesion, or ulcer.   LABORATORY PANEL:   CBC Recent Labs  Lab 08/02/18 0525  WBC 3.7*  HGB 10.3*  HCT 30.7*  PLT 36*    ------------------------------------------------------------------------------------------------------------------ Chemistries  Recent Labs  Lab 07/31/18 2103 08/01/18 0526  NA 139 137  K 5.1 4.2  CL 99 104  CO2 28 25  GLUCOSE 106* 123*  BUN 32* 29*  CREATININE 1.53* 1.02*  CALCIUM 7.4* 6.7*  AST 24  --   ALT 14  --   ALKPHOS 51  --   BILITOT 0.5  --    ------------------------------------------------------------------------------------------------------------------  Cardiac Enzymes No results for input(s): TROPONINI in the last 168 hours. ------------------------------------------------------------------------------------------------------------------  RADIOLOGY:  Ct Angio Chest Pe W And/or Wo Contrast  Result Date: 07/31/2018 CLINICAL DATA:  Shortness of breath and hypoxia EXAM: CT ANGIOGRAPHY CHEST WITH CONTRAST TECHNIQUE: Multidetector CT imaging of the chest was performed using the standard protocol during bolus administration of intravenous contrast. Multiplanar CT image reconstructions and MIPs were obtained to evaluate the vascular anatomy. CONTRAST:  73mL OMNIPAQUE IOHEXOL 350 MG/ML SOLN COMPARISON:  Plain film from earlier in the same day. FINDINGS: Cardiovascular: Thoracic aorta demonstrates atherosclerotic calcifications without aneurysmal dilatation or dissection. No cardiac enlargement is seen. The pulmonary artery shows a normal branching pattern. No filling defects are noted to suggest pulmonary embolism. No significant coronary calcifications are noted. Mediastinum/Nodes: Thoracic inlet is within normal limits. No hilar or mediastinal adenopathy is noted. The esophagus is within normal limits. Lungs/Pleura: Mild bibasilar atelectasis is noted. No sizable effusion is seen. No parenchymal nodules are noted. Some changes of air trapping are noted. Upper Abdomen: Visualized upper abdomen is within normal limits. Musculoskeletal: Degenerative changes of the thoracic spine  are noted. No rib abnormality is noted. Review  of the MIP images confirms the above findings. IMPRESSION: No evidence of pulmonary emboli. Mild atelectasis bilaterally. No other focal abnormality is noted. Aortic Atherosclerosis (ICD10-I70.0). Electronically Signed   By: Inez Catalina M.D.   On: 07/31/2018 21:54     ASSESSMENT AND PLAN:   * Hypoxia More likely due to drowsiness and atelectasis. Off oxygen saturations 96% on room air     Schizoaffective disorder, bipolar type  -psychiatry is managing the patient's medications and treatment for this problem.  Continue to defer to their recommendations    Diabetes mellitus without complication-glucose levels relatively stable, sliding scale insulin coverage ordered    HTN (hypertension) -hold antihypertensives for now  IVF    GERD (gastroesophageal reflux disease) -continue home dose PPI    Hypothyroidism -home dose thyroid replacement  Right conjuctivitis On Moxi drops  chronic thrombocytopenia NO bleeding Consulted hematology.  Appreciate input.  Will monitor.  All the records are reviewed and case discussed with Care Management/Social Worker Management plans discussed with the patient, family and they are in agreement.  CODE STATUS: FULL CODE  TOTAL TIME TAKING CARE OF THIS PATIENT: 35 minutes.   POSSIBLE D/C IN 1-2 DAYS, DEPENDING ON CLINICAL CONDITION.  Neita Carp M.D on 08/02/2018 at 4:10 PM  Between 7am to 6pm - Pager - [phone]  After 6pm go to [email or web address] - password [email or web address] [place] [facility]  Office  [phone]  CC: Primary care physician; System, Pcp Not In  Note: This dictation was prepared with Dragon dictation along with smaller phrase technology. Any transcriptional errors that result from this process are unintentional.

## 2018-08-02 NOTE — Progress Notes (Signed)
Patient irritable, refusing evening medications, educated patient and continues to refuse.  After 10 minutes or so called back to the room by patient and she agreed to take the medications.  Patient requesting to speak with MD, wanting to discharge back to her group home.  Made her aware the Sharyn Lull, care manager, was not able to get in touch with group home today to confirm that she could return. Patient on the phone with her group home asking if she can come back.    Clarise Cruz, BSN

## 2018-08-03 DIAGNOSIS — F79 Unspecified intellectual disabilities: Secondary | ICD-10-CM

## 2018-08-03 DIAGNOSIS — F603 Borderline personality disorder: Secondary | ICD-10-CM

## 2018-08-03 LAB — CBC WITH DIFFERENTIAL/PLATELET
Abs Immature Granulocytes: 0.04 10*3/uL (ref 0.00–0.07)
Basophils Absolute: 0 10*3/uL (ref 0.0–0.1)
Basophils Relative: 1 %
Eosinophils Absolute: 0 10*3/uL (ref 0.0–0.5)
Eosinophils Relative: 0 %
HCT: 34.3 % — ABNORMAL LOW (ref 36.0–46.0)
Hemoglobin: 11.3 g/dL — ABNORMAL LOW (ref 12.0–15.0)
Immature Granulocytes: 1 %
Lymphocytes Relative: 34 %
Lymphs Abs: 1.2 10*3/uL (ref 0.7–4.0)
MCH: 31.4 pg (ref 26.0–34.0)
MCHC: 32.9 g/dL (ref 30.0–36.0)
MCV: 95.3 fL (ref 80.0–100.0)
Monocytes Absolute: 0.4 10*3/uL (ref 0.1–1.0)
Monocytes Relative: 12 %
Neutro Abs: 1.9 10*3/uL (ref 1.7–7.7)
Neutrophils Relative %: 52 %
Platelets: 47 10*3/uL — ABNORMAL LOW (ref 150–400)
RBC: 3.6 MIL/uL — ABNORMAL LOW (ref 3.87–5.11)
RDW: 12.9 % (ref 11.5–15.5)
WBC: 3.6 10*3/uL — ABNORMAL LOW (ref 4.0–10.5)
nRBC: 0 % (ref 0.0–0.2)

## 2018-08-03 LAB — GLUCOSE, CAPILLARY
Glucose-Capillary: 152 mg/dL — ABNORMAL HIGH (ref 70–99)
Glucose-Capillary: 87 mg/dL (ref 70–99)
Glucose-Capillary: 110 mg/dL — ABNORMAL HIGH (ref 70–99)
Glucose-Capillary: 98 mg/dL (ref 70–99)

## 2018-08-03 MED ORDER — ZIPRASIDONE MESYLATE 20 MG IM SOLR
10.0000 mg | Freq: Once | INTRAMUSCULAR | Status: AC
  Administered 2018-08-03: 10 mg via INTRAMUSCULAR
  Filled 2018-08-03: qty 20

## 2018-08-03 NOTE — TOC Progression Note (Signed)
Transition of Care [facility]) - Progression Note    Patient Details  Name: Jessica [name] MRN: [id number] Date of Birth: [birth year]  Transition of Care [facility]) CM/SW Contact  Shelbie Hutching, RN Phone Number: 08/03/2018, 9:42 AM  Clinical Narrative:    Ward Chatters returned [name] call from yesterday.  [name] reports that she will be happy to accept patient back to the Group home when she is appropriate but [name] talked with the nurse yesterday and could hear the patient yelling in the background.  [name] does not feel that the patient is ready to leave and will not accept her back at this time.  RNCM also spoke with patient's sister [name] and updated her on plan of care.  RNCM has reached out to [place] home and left a message, will attempt to contact Merlene Morse again today.    Expected Discharge Plan: Group Home Barriers to Discharge: Barriers Unresolved (comment)(patient behaviors- unacceptable verbal slurs)  Expected Discharge Plan and Services Expected Discharge Plan: Group Home                                               Social Determinants of Health (SDOH) Interventions    Readmission Risk Interventions No flowsheet data found.

## 2018-08-03 NOTE — Progress Notes (Signed)
Patient attempting to remove her IV.  Cussing at staff.  PRN ativan given IV.  Clarise Cruz, BSN

## 2018-08-03 NOTE — TOC Progression Note (Signed)
Transition of Care [facility]) - Progression Note    Patient Details  Name: Jessica [name] MRN: [id number] Date of Birth: 08-03-1965  [facility]) CM/SW Contact  Shelbie Hutching, RN Phone Number: 08/03/2018, 10:36 AM  Clinical Narrative:     Per Joaquim Lai patient is now a legal guardian of [facility] contact person with DSS is Kalman Drape [phone].  Expected Discharge Plan: Group Home Barriers to Discharge: Barriers Unresolved (comment)(patient behaviors- unacceptable verbal slurs)  Expected Discharge Plan and Services Expected Discharge Plan: Group Home                                               Social Determinants of Health (SDOH) Interventions    Readmission Risk Interventions No flowsheet data found.

## 2018-08-03 NOTE — Discharge Summary (Addendum)
[place] at [place] NAME: Jessica [name]    MR#:  [id number]  DATE OF BIRTH:  Dec 18, 1965  DATE OF ADMISSION:  07/20/2018  ADMITTING PHYSICIAN: Lance Coon, MD  DATE OF DISCHARGE: 08/04/2018  PRIMARY CARE PHYSICIAN: System, Pcp Not In   ADMISSION DIAGNOSIS:  Weakness [R53.1] Aggressive behavior [R46.89] Hypoxia [R09.02] Preseptal cellulitis [R15.400] Hypotension, unspecified hypotension type [I95.9]  DISCHARGE DIAGNOSIS:  Principal Problem:   Hypoxia Active Problems:   Diabetes mellitus without complication (HCC)   Intellectual disability   Borderline personality disorder (HCC)   HTN (hypertension)   Schizoaffective disorder, bipolar type (HCC)   GERD (gastroesophageal reflux disease)   Hypothyroidism   SECONDARY DIAGNOSIS:   Past Medical History:  Diagnosis Date  . Anxiety   . Depression   . Diabetes mellitus without complication ([place])   . GERD (gastroesophageal reflux disease)   . Homicidal ideation   . Hypertension   . Hypothyroidism      ADMITTING HISTORY  Jessica [name]  is a 53 y.o. female who presents with chief complaint as above.  Patient has been in the ED for quite some time now under psychiatrist care awaiting placement.  Over the last 48 hours she began to develop greater lethargy, then some hypoxia.  She had an episode of some eye swelling concerning for possible orbital cellulitis, although imaging did not show anything along those lines.  Today she has become hypoxic and somewhat more hypotensive.  Etiology for all this is somewhat elusive upfront.  Hospitalist were called for admission.  HOSPITAL COURSE:   *Schizoaffective disorder, bipolar type Patient still has significant agitation.  Hitting staff members at times and threatening. Discussed with psychiatrist Dr. Einar Grad. Patient will be admitted to behavioral health unit.  She has had recurrent visits to ED and group home seems to be an appropriate at this  time.  * Hypoxia More likely due to drowsiness and atelectasis. Off oxygen saturations 96% on room air  *Diabetes mellitus without complication On levemir  *HTN (hypertension) -hold antihypertensives for now   *GERD (gastroesophageal reflux disease) -continue home dose PPI  *Hypothyroidism -home dose thyroid replacement  * Right conjuctivitis On Moxi drops  * Chronic thrombocytopenia No bleeding. Consulted hematology.  Appreciate input.  Will monitor.  Improving Nedd repeat platelet check in 10-14 days after discharge  Stable for transfer to [facility].  CONSULTS OBTAINED:  Treatment Team:  Sindy Guadeloupe, MD  DRUG ALLERGIES:   Allergies  Allergen Reactions  . Abilify [Aripiprazole] Other (See Comments)    Chokes on food  . Haldol [Haloperidol] Other (See Comments)    Dizziness  . Risperdal [Risperidone] Other (See Comments)    Leg weakness    DISCHARGE MEDICATIONS:   Allergies as of 08/03/2018      Reactions   Abilify [aripiprazole] Other (See Comments)   Chokes on food   Haldol [haloperidol] Other (See Comments)   Dizziness   Risperdal [risperidone] Other (See Comments)   Leg weakness      Medication List    STOP taking these medications   DULoxetine 60 MG capsule Commonly known as: CYMBALTA   fluPHENAZine 5 MG tablet Commonly known as: PROLIXIN   lurasidone 40 MG Tabs tablet Commonly known as: LATUDA   traZODone 100 MG tablet Commonly known as: DESYREL     TAKE these medications   atorvastatin 10 MG tablet Commonly known as: LIPITOR Take 1 tablet (10 mg total) by mouth daily.   carvedilol 6.25  MG tablet Commonly known as: COREG Take 1 tablet (6.25 mg total) by mouth 2 (two) times daily.   divalproex 250 MG DR tablet Commonly known as: DEPAKOTE Take 1 tablet (250 mg total) by mouth 2 (two) times daily. What changed:   medication strength  how much to take  when to take this   ferrous sulfate 325 (65 FE) MG  tablet Commonly known as: FeroSul Take 1 tablet (325 mg total) by mouth 2 (two) times daily.   gabapentin 600 MG tablet Commonly known as: NEURONTIN Take 1 tablet (600 mg total) by mouth 3 (three) times daily.   glipiZIDE 5 MG 24 hr tablet Commonly known as: GLUCOTROL XL Take 5 mg by mouth daily.   insulin detemir 100 UNIT/ML injection Commonly known as: LEVEMIR Inject 0.1 mLs (10 Units total) into the skin 2 (two) times daily.   lamoTRIgine 25 MG tablet Commonly known as: LAMICTAL Take 1 tablet (25 mg total) by mouth 2 (two) times daily. What changed:   medication strength  how much to take  when to take this   levothyroxine 150 MCG tablet Commonly known as: SYNTHROID Take 1 tablet (150 mcg total) by mouth daily at 6 (six) AM.   lisinopril 10 MG tablet Commonly known as: ZESTRIL Take 1 tablet (10 mg total) by mouth daily.   loratadine 10 MG tablet Commonly known as: CLARITIN Take 1 tablet (10 mg total) by mouth daily.   metFORMIN 500 MG tablet Commonly known as: GLUCOPHAGE Take 1 tablet (500 mg total) by mouth 2 (two) times daily.   moxifloxacin 0.5 % ophthalmic solution Commonly known as: VIGAMOX Place 1 drop into the right eye 3 (three) times daily. For 5 days   paliperidone 3 MG 24 hr tablet Commonly known as: INVEGA Take 1 tablet (3 mg total) by mouth daily.   pantoprazole 40 MG tablet Commonly known as: PROTONIX Take 1 tablet (40 mg total) by mouth daily.       Today   VITAL SIGNS:  Blood pressure 131/82, pulse 100, temperature 98.3 F (36.8 C), temperature source Oral, resp. rate 18, height 4\' 10"  (1.473 m), weight 79.4 kg, SpO2 94 %.  I/O:    Intake/Output Summary (Last 24 hours) at 08/03/2018 1358 Last data filed at 08/03/2018 1000 Gross per 24 hour  Intake 240 ml  Output -  Net 240 ml    PHYSICAL EXAMINATION:  Physical Exam  GENERAL:  53 y.o.-year-old patient lying in the bed with no acute distress.  LUNGS: Normal breath sounds  bilaterally, no wheezing, rales,rhonchi or crepitation. No use of accessory muscles of respiration.  CARDIOVASCULAR: S1, S2 normal. No murmurs, rubs, or gallops.  ABDOMEN: Soft, non-tender, non-distended. Bowel sounds present. No organomegaly or mass.  NEUROLOGIC: Moves all 4 extremities. PSYCHIATRIC: The patient is alert and awake SKIN: No obvious rash, lesion, or ulcer.   DATA REVIEW:   CBC Recent Labs  Lab 08/02/18 0525  WBC 3.7*  HGB 10.3*  HCT 30.7*  PLT 36*    Chemistries  Recent Labs  Lab 07/31/18 2103 08/01/18 0526  NA 139 137  K 5.1 4.2  CL 99 104  CO2 28 25  GLUCOSE 106* 123*  BUN 32* 29*  CREATININE 1.53* 1.02*  CALCIUM 7.4* 6.7*  AST 24  --   ALT 14  --   ALKPHOS 51  --   BILITOT 0.5  --     Cardiac Enzymes No results for input(s): TROPONINI in the last 168 hours.  Microbiology Results  Results for orders placed or performed during the hospital encounter of 07/20/18  SARS Coronavirus 2 (CEPHEID- Performed in [place] hospital lab), Hosp Order     Status: None   Collection Time: 07/31/18  7:44 AM   Specimen: Nasopharyngeal Swab  Result Value Ref Range Status   SARS Coronavirus 2 NEGATIVE NEGATIVE Final    Comment: (NOTE) If result is NEGATIVE SARS-CoV-2 target nucleic acids are NOT DETECTED. The SARS-CoV-2 RNA is generally detectable in upper and lower  respiratory specimens during the acute phase of infection. The lowest  concentration of SARS-CoV-2 viral copies this assay can detect is 250  copies / mL. A negative result does not preclude SARS-CoV-2 infection  and should not be used as the sole basis for treatment or other  patient management decisions.  A negative result may occur with  improper specimen collection / handling, submission of specimen other  than nasopharyngeal swab, presence of viral mutation(s) within the  areas targeted by this assay, and inadequate number of viral copies  (<250 copies / mL). A negative result must be  combined with clinical  observations, patient history, and epidemiological information. If result is POSITIVE SARS-CoV-2 target nucleic acids are DETECTED. The SARS-CoV-2 RNA is generally detectable in upper and lower  respiratory specimens dur ing the acute phase of infection.  Positive  results are indicative of active infection with SARS-CoV-2.  Clinical  correlation with patient history and other diagnostic information is  necessary to determine patient infection status.  Positive results do  not rule out bacterial infection or co-infection with other viruses. If result is PRESUMPTIVE POSTIVE SARS-CoV-2 nucleic acids MAY BE PRESENT.   A presumptive positive result was obtained on the submitted specimen  and confirmed on repeat testing.  While 2019 novel coronavirus  (SARS-CoV-2) nucleic acids may be present in the submitted sample  additional confirmatory testing may be necessary for epidemiological  and / or clinical management purposes  to differentiate between  SARS-CoV-2 and other Sarbecovirus currently known to infect humans.  If clinically indicated additional testing with an alternate test  methodology [phone]) is advised. The SARS-CoV-2 RNA is generally  detectable in upper and lower respiratory sp ecimens during the acute  phase of infection. The expected result is Negative. Fact Sheet for Patients:  StrictlyIdeas.no Fact Sheet for Healthcare Providers: BankingDealers.co.za This test is not yet approved or cleared by the [place] FDA and has been authorized for detection and/or diagnosis of SARS-CoV-2 by FDA under an Emergency Use Authorization (EUA).  This EUA will remain in effect (meaning this test can be used) for the duration of the COVID-19 declaration under Section 564(b)(1) of the Act, 21 U.S.C. section 360bbb-3(b)(1), unless the authorization is terminated or revoked sooner. Performed at [facility], [address], [place], [place] [address]   Urine culture     Status: None   Collection Time: 07/31/18  8:56 PM   Specimen: In/Out Cath Urine  Result Value Ref Range Status   Specimen Description   Final    IN/OUT CATH URINE Performed at [facility], [address], [place], [place] [address]    Special Requests   Final    NONE Performed at [facility], [address] [place], [place] [address]    Culture   Final    NO GROWTH Performed at [facility], [address] [place], [place] [address]    Report Status 08/01/2018 FINAL  Final  Blood  culture (routine x 2)     Status: None (Preliminary result)   Collection Time: 07/31/18  9:03 PM   Specimen: BLOOD  Result Value Ref Range Status   Specimen Description BLOOD LEFT ARM  Final   Special Requests   Final    BOTTLES DRAWN AEROBIC AND ANAEROBIC Blood Culture results may not be optimal due to an excessive volume of blood received in culture bottles   Culture   Final    NO GROWTH 3 DAYS Performed at [facility], [address] [place], [place] [address]    Report Status PENDING  Incomplete  Blood culture (routine x 2)     Status: None (Preliminary result)   Collection Time: 07/31/18  9:03 PM   Specimen: BLOOD  Result Value Ref Range Status   Specimen Description BLOOD LEFT HAND  Final   Special Requests   Final    BOTTLES DRAWN AEROBIC AND ANAEROBIC Blood Culture results may not be optimal due to an excessive volume of blood received in culture bottles   Culture   Final    NO GROWTH 3 DAYS Performed at [facility], [address], [place], [place] [address]    Report Status PENDING  Incomplete  MRSA PCR Screening     Status: None   Collection Time: 08/01/18  1:19 AM   Specimen: Nasal Mucosa; Nasopharyngeal  Result Value Ref Range Status   MRSA by PCR NEGATIVE NEGATIVE Final    Comment:        The GeneXpert MRSA Assay (FDA approved for NASAL specimens only), is  one component of a comprehensive MRSA colonization surveillance program. It is not intended to diagnose MRSA infection nor to guide or monitor treatment for MRSA infections. Performed at [facility], [address], [place], [place] [address]     RADIOLOGY:  No results found.  Follow up with PCP in 1 week.  Management plans discussed with the patient, family and they are in agreement.  CODE STATUS:     Code Status Orders  (From admission, onward)         Start     Ordered   08/01/18 0038  Full code  Continuous     08/01/18 0037        Code Status History    Date Active Date Inactive Code Status Order ID Comments User Context   06/15/2018 2326 06/20/2018 1923 Full Code [id number]  Lavella Hammock, MD Inpatient   06/04/2018 1550 06/07/2018 1708 Full Code [id number]  Chong Sicilian, DO Inpatient   12/29/2017 1920 01/03/2018 1357 Full Code [id number]  Clovis Fredrickson, MD Inpatient   01/02/2017 2107 01/15/2017 1709 Full Code [id number]  Vicenta Aly, NP Inpatient   Advance Care Planning Activity      TOTAL TIME TAKING CARE OF THIS PATIENT ON DAY OF DISCHARGE: more than 30 minutes.   Leia Alf Seham Gardenhire M.D on 08/03/2018 at 1:58 PM  Between 7am to 6pm - Pager - [phone]  After 6pm go to [email or web address] - password [email or web address] [place] [facility]  Office  [phone]  CC: Primary care physician; System, Pcp Not In  Note: This dictation was prepared with Dragon dictation along with smaller phrase technology. Any transcriptional errors that result from this process are unintentional.

## 2018-08-03 NOTE — Care Management Important Message (Signed)
Important Message  Patient Details  Name: Jessica [name] MRN: [id number] Date of Birth: [birth year]   Medicare Important Message Given:  Other (see comment)  Left a message for legal guardian at [place], Kalman Drape [phone] to review Important Message from Medicare.  Juliann Pulse A [place] 08/03/2018, 11:50 AM

## 2018-08-03 NOTE — Progress Notes (Signed)
Patient screaming and cussing at staff.  Attempting to pulled out IV.  Pulled safety mitts off and throw them across the room.  Bragging about assaulting someone at group home and [name] from group home.  Threatening to kill everyone in the room.  Paged Dr. Estanislado Pandy and received new orders.  Clarise Cruz, BSN

## 2018-08-03 NOTE — TOC Progression Note (Signed)
Transition of Care [facility]) - Progression Note    Patient Details  Name: Jessica [name] MRN: [id number] Date of Birth: 27-Jan-1965  [facility]) CM/SW Contact  Shelbie Hutching, RN Phone Number: 08/03/2018, 1:54 PM  Clinical Narrative:    Jessica [name] with DSS notified RNCM that guardianship has been appointed to [name].     Expected Discharge Plan: Group Home Barriers to Discharge: Barriers Unresolved (comment)(patient behaviors- unacceptable verbal slurs)  Expected Discharge Plan and Services Expected Discharge Plan: Group Home                                               Social Determinants of Health (SDOH) Interventions    Readmission Risk Interventions No flowsheet data found.

## 2018-08-03 NOTE — Progress Notes (Signed)
[facility] NAME: Kalayna Noy    MR#:  [id number]  DATE OF BIRTH:  1965-07-15  SUBJECTIVE:  CHIEF COMPLAINT:   Chief Complaint  Patient presents with  . Aggressive Behavior   Awake and alert.  Waiting for acceptance at group home.  Afebrile.  No shortness of breath.  REVIEW OF SYSTEMS:    Review of Systems  Unable to perform ROS: Mental status change    DRUG ALLERGIES:   Allergies  Allergen Reactions  . Abilify [Aripiprazole] Other (See Comments)    Chokes on food  . Haldol [Haloperidol] Other (See Comments)    Dizziness  . Risperdal [Risperidone] Other (See Comments)    Leg weakness    VITALS:  Blood pressure 131/82, pulse 100, temperature 98.3 F (36.8 C), temperature source Oral, resp. rate 18, height 4\' 10"  (1.473 m), weight 79.4 kg, SpO2 94 %.  PHYSICAL EXAMINATION:   Physical Exam  GENERAL:  53 y.o.-year-old patient lying in the bed with no acute distress.  EYES: Pupils equal, round, reactive to light and accommodation. No scleral icterus. Extraocular muscles intact.  HEENT: Head atraumatic, normocephalic. Oropharynx and nasopharynx clear.  NECK:  Supple, no jugular venous distention. No thyroid enlargement, no tenderness.  LUNGS: Normal breath sounds bilaterally, no wheezing, rales, rhonchi. No use of accessory muscles of respiration.  CARDIOVASCULAR: S1, S2 normal. No murmurs, rubs, or gallops.  ABDOMEN: Soft, nontender, nondistended. Bowel sounds present. No organomegaly or mass.  EXTREMITIES: No cyanosis, clubbing or edema b/l.    NEUROLOGIC: Cranial nerves II through XII are intact. No focal Motor or sensory deficits b/l.   PSYCHIATRIC: The patient is alert and awake SKIN: No obvious rash, lesion, or ulcer.   LABORATORY PANEL:   CBC Recent Labs  Lab 08/02/18 0525  WBC 3.7*  HGB 10.3*  HCT 30.7*  PLT 36*    ------------------------------------------------------------------------------------------------------------------ Chemistries  Recent Labs  Lab 07/31/18 2103 08/01/18 0526  NA 139 137  K 5.1 4.2  CL 99 104  CO2 28 25  GLUCOSE 106* 123*  BUN 32* 29*  CREATININE 1.53* 1.02*  CALCIUM 7.4* 6.7*  AST 24  --   ALT 14  --   ALKPHOS 51  --   BILITOT 0.5  --    ------------------------------------------------------------------------------------------------------------------  Cardiac Enzymes No results for input(s): TROPONINI in the last 168 hours. ------------------------------------------------------------------------------------------------------------------  RADIOLOGY:  No results found.   ASSESSMENT AND PLAN:   * Hypoxia More likely due to drowsiness and atelectasis. Off oxygen saturations 96% on room air    * Schizoaffective disorder, bipolar type  -psychiatry is managing the patient's medications and treatment for this problem.  Continue to defer to their recommendations Discussed with psychiatrist Dr. Einar Grad    Diabetes mellitus without complication-glucose levels relatively stable, sliding scale insulin coverage ordered    HTN (hypertension) -hold antihypertensives for now     GERD (gastroesophageal reflux disease) -continue home dose PPI    Hypothyroidism -home dose thyroid replacement  Right conjuctivitis On [place] drops  chronic thrombocytopenia No bleeding Consulted hematology.  Appreciate input.  Will monitor.  All the records are reviewed and case discussed with Care Management/Social Worker Management plans discussed with the patient, family and they are in agreement.  CODE STATUS: FULL CODE  TOTAL TIME TAKING CARE OF THIS PATIENT: 35 minutes.   POSSIBLE D/C IN 1-2 DAYS, DEPENDING ON CLINICAL CONDITION.  Neita Carp M.D on 08/03/2018 at 1:51 PM  Between 7am to  6pm - Pager - [phone]  After 6pm go to [email or web address] - password [email or web address]  [facility]  Office  [phone]  CC: Primary care physician; System, Pcp Not In  Note: This dictation was prepared with Dragon dictation along with smaller phrase technology. Any transcriptional errors that result from this process are unintentional.
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
## 2018-08-03 NOTE — TOC Progression Note (Signed)
Transition of Care [facility]) - Progression Note    Patient Details  Name: Jessica [name] MRN: [id number] Date of Birth: [birth year]  Transition of Care [facility] CM/SW Contact  Shelbie Hutching, RN Phone Number: 08/03/2018, 2:32 PM  Clinical Narrative:     [facility] declines to accept patient.  TOC team will cont to work on placement.    Expected Discharge Plan: Group Home Barriers to Discharge: Barriers Unresolved (comment)(patient behaviors- unacceptable verbal slurs)  Expected Discharge Plan and Services Expected Discharge Plan: Group Home                                               Social Determinants of Health (SDOH) Interventions    Readmission Risk Interventions No flowsheet data found.

## 2018-08-04 ENCOUNTER — Other Ambulatory Visit: Payer: Self-pay

## 2018-08-04 ENCOUNTER — Inpatient Hospital Stay
Admission: AD | Admit: 2018-08-04 | Discharge: 2018-09-19 | DRG: 885 | Disposition: A | Discharge status: Home / Self Care | Payer: Medicare Other | Source: Intra-hospital | Attending: Psychiatry | Admitting: Psychiatry

## 2018-08-04 DIAGNOSIS — F71 Moderate intellectual disabilities: Secondary | ICD-10-CM | POA: Diagnosis present

## 2018-08-04 DIAGNOSIS — M797 Fibromyalgia: Secondary | ICD-10-CM | POA: Diagnosis present

## 2018-08-04 DIAGNOSIS — F3489 Other specified persistent mood disorders: Secondary | ICD-10-CM | POA: Diagnosis not present

## 2018-08-04 DIAGNOSIS — F250 Schizoaffective disorder, bipolar type: Secondary | ICD-10-CM | POA: Diagnosis present

## 2018-08-04 DIAGNOSIS — F319 Bipolar disorder, unspecified: Secondary | ICD-10-CM | POA: Diagnosis present

## 2018-08-04 DIAGNOSIS — Z20828 Contact with and (suspected) exposure to other viral communicable diseases: Secondary | ICD-10-CM | POA: Diagnosis present

## 2018-08-04 DIAGNOSIS — F603 Borderline personality disorder: Secondary | ICD-10-CM | POA: Diagnosis present

## 2018-08-04 DIAGNOSIS — E119 Type 2 diabetes mellitus without complications: Secondary | ICD-10-CM

## 2018-08-04 DIAGNOSIS — D509 Iron deficiency anemia, unspecified: Secondary | ICD-10-CM | POA: Diagnosis present

## 2018-08-04 DIAGNOSIS — R456 Violent behavior: Secondary | ICD-10-CM

## 2018-08-04 DIAGNOSIS — F79 Unspecified intellectual disabilities: Secondary | ICD-10-CM | POA: Diagnosis not present

## 2018-08-04 DIAGNOSIS — Z87891 Personal history of nicotine dependence: Secondary | ICD-10-CM | POA: Diagnosis not present

## 2018-08-04 DIAGNOSIS — R451 Restlessness and agitation: Secondary | ICD-10-CM | POA: Diagnosis present

## 2018-08-04 DIAGNOSIS — E039 Hypothyroidism, unspecified: Secondary | ICD-10-CM | POA: Diagnosis present

## 2018-08-04 DIAGNOSIS — K219 Gastro-esophageal reflux disease without esophagitis: Secondary | ICD-10-CM | POA: Diagnosis present

## 2018-08-04 DIAGNOSIS — I10 Essential (primary) hypertension: Secondary | ICD-10-CM | POA: Diagnosis present

## 2018-08-04 LAB — GLUCOSE, CAPILLARY
Glucose-Capillary: 121 mg/dL — ABNORMAL HIGH (ref 70–99)
Glucose-Capillary: 107 mg/dL — ABNORMAL HIGH (ref 70–99)
Glucose-Capillary: 138 mg/dL — ABNORMAL HIGH (ref 70–99)

## 2018-08-04 MED ORDER — FERROUS SULFATE 325 (65 FE) MG PO TABS
325.0000 mg | ORAL_TABLET | Freq: Two times a day (BID) | ORAL | Status: DC
  Administered 2018-08-05 – 2018-09-19 (×88): 325 mg via ORAL
  Filled 2018-08-04 (×88): qty 1

## 2018-08-04 MED ORDER — MAGNESIUM HYDROXIDE 400 MG/5ML PO SUSP: 30.0000 mL | Freq: Every day | ORAL | Status: DC | PRN

## 2018-08-04 MED ORDER — TRAZODONE HCL 100 MG PO TABS
100.0000 mg | ORAL_TABLET | Freq: Every day | ORAL | Status: DC
  Administered 2018-08-04 – 2018-09-18 (×46): 100 mg via ORAL
  Filled 2018-08-04 (×46): qty 1

## 2018-08-04 MED ORDER — INSULIN ASPART 100 UNIT/ML ~~LOC~~ SOLN
0.0000 [IU] | Freq: Three times a day (TID) | SUBCUTANEOUS | Status: DC
  Administered 2018-08-06: 2 [IU] via SUBCUTANEOUS
  Administered 2018-08-06 (×2): 3 [IU] via SUBCUTANEOUS
  Administered 2018-08-07 (×2): 5 [IU] via SUBCUTANEOUS
  Administered 2018-08-07: 12:00:00 2 [IU] via SUBCUTANEOUS
  Administered 2018-08-08: 3 [IU] via SUBCUTANEOUS
  Administered 2018-08-08: 7 [IU] via SUBCUTANEOUS
  Administered 2018-08-08: 3 [IU] via SUBCUTANEOUS
  Administered 2018-08-09: 5 [IU] via SUBCUTANEOUS
  Administered 2018-08-09: 9 [IU] via SUBCUTANEOUS
  Administered 2018-08-10: 2 [IU] via SUBCUTANEOUS
  Administered 2018-08-10: 08:00:00 7 [IU] via SUBCUTANEOUS
  Administered 2018-08-10: 1 [IU] via SUBCUTANEOUS
  Administered 2018-08-11: 2 [IU] via SUBCUTANEOUS
  Administered 2018-08-11 – 2018-08-12 (×2): 5 [IU] via SUBCUTANEOUS
  Administered 2018-08-12: 12:00:00 3 [IU] via SUBCUTANEOUS
  Administered 2018-08-13: 1 [IU] via SUBCUTANEOUS
  Administered 2018-08-13: 07:00:00 5 [IU] via SUBCUTANEOUS
  Administered 2018-08-13 – 2018-08-14 (×3): 2 [IU] via SUBCUTANEOUS
  Administered 2018-08-15: 1 [IU] via SUBCUTANEOUS
  Administered 2018-08-15 (×2): 3 [IU] via SUBCUTANEOUS
  Administered 2018-08-16: 1 [IU] via SUBCUTANEOUS
  Administered 2018-08-16: 5 [IU] via SUBCUTANEOUS
  Administered 2018-08-17: 12:00:00 2 [IU] via SUBCUTANEOUS
  Administered 2018-08-17: 3 [IU] via SUBCUTANEOUS
  Administered 2018-08-18: 2 [IU] via SUBCUTANEOUS
  Administered 2018-08-19: 17:00:00 1 [IU] via SUBCUTANEOUS
  Administered 2018-08-19: 08:00:00 2 [IU] via SUBCUTANEOUS
  Administered 2018-08-20: 3 [IU] via SUBCUTANEOUS
  Administered 2018-08-20 (×2): 1 [IU] via SUBCUTANEOUS
  Administered 2018-08-21: 2 [IU] via SUBCUTANEOUS
  Administered 2018-08-21 – 2018-08-23 (×6): 1 [IU] via SUBCUTANEOUS
  Administered 2018-08-24: 2 [IU] via SUBCUTANEOUS
  Administered 2018-08-24: 1 [IU] via SUBCUTANEOUS
  Administered 2018-08-25: 2 [IU] via SUBCUTANEOUS
  Administered 2018-08-25 (×2): 1 [IU] via SUBCUTANEOUS
  Administered 2018-08-26 – 2018-08-27 (×3): 2 [IU] via SUBCUTANEOUS
  Administered 2018-08-27 – 2018-08-28 (×3): 1 [IU] via SUBCUTANEOUS
  Administered 2018-08-28 – 2018-08-30 (×3): 3 [IU] via SUBCUTANEOUS
  Administered 2018-08-31 (×2): 1 [IU] via SUBCUTANEOUS
  Administered 2018-08-31: 2 [IU] via SUBCUTANEOUS
  Administered 2018-09-01: 17:00:00 1 [IU] via SUBCUTANEOUS
  Administered 2018-09-01: 2 [IU] via SUBCUTANEOUS
  Administered 2018-09-02: 1 [IU] via SUBCUTANEOUS
  Administered 2018-09-02: 3 [IU] via SUBCUTANEOUS
  Administered 2018-09-03: 2 [IU] via SUBCUTANEOUS
  Administered 2018-09-03: 1 [IU] via SUBCUTANEOUS
  Administered 2018-09-04: 2 [IU] via SUBCUTANEOUS
  Administered 2018-09-05 (×2): 1 [IU] via SUBCUTANEOUS
  Administered 2018-09-06: 2 [IU] via SUBCUTANEOUS
  Administered 2018-09-06: 17:00:00 1 [IU] via SUBCUTANEOUS
  Administered 2018-09-06: 2 [IU] via SUBCUTANEOUS
  Administered 2018-09-08: 7 [IU] via SUBCUTANEOUS
  Administered 2018-09-08 – 2018-09-09 (×3): 1 [IU] via SUBCUTANEOUS
  Administered 2018-09-09: 2 [IU] via SUBCUTANEOUS
  Administered 2018-09-09: 12:00:00 1 [IU] via SUBCUTANEOUS
  Administered 2018-09-10: 3 [IU] via SUBCUTANEOUS
  Administered 2018-09-10 (×2): 2 [IU] via SUBCUTANEOUS
  Administered 2018-09-11 – 2018-09-14 (×4): 1 [IU] via SUBCUTANEOUS
  Administered 2018-09-14 (×2): 2 [IU] via SUBCUTANEOUS
  Administered 2018-09-15: 17:00:00 3 [IU] via SUBCUTANEOUS
  Administered 2018-09-15: 2 [IU] via SUBCUTANEOUS
  Administered 2018-09-15 – 2018-09-16 (×2): 1 [IU] via SUBCUTANEOUS
  Administered 2018-09-16: 5 [IU] via SUBCUTANEOUS
  Administered 2018-09-17: 16:00:00 1 [IU] via SUBCUTANEOUS
  Administered 2018-09-17: 07:00:00 2 [IU] via SUBCUTANEOUS
  Administered 2018-09-17: 3 [IU] via SUBCUTANEOUS
  Administered 2018-09-18: 2 [IU] via SUBCUTANEOUS
  Administered 2018-09-18: 12:00:00 1 [IU] via SUBCUTANEOUS
  Administered 2018-09-18 – 2018-09-19 (×2): 2 [IU] via SUBCUTANEOUS
  Filled 2018-08-04 (×76): qty 1

## 2018-08-04 MED ORDER — PANTOPRAZOLE SODIUM 40 MG PO TBEC
40.0000 mg | DELAYED_RELEASE_TABLET | Freq: Every day | ORAL | Status: DC
  Administered 2018-08-05 – 2018-09-19 (×45): 40 mg via ORAL
  Filled 2018-08-04 (×45): qty 1

## 2018-08-04 MED ORDER — INSULIN ASPART 100 UNIT/ML ~~LOC~~ SOLN
0.0000 [IU] | Freq: Every day | SUBCUTANEOUS | Status: DC
  Administered 2018-08-07 – 2018-08-08 (×2): 3 [IU] via SUBCUTANEOUS
  Administered 2018-08-09: 2 [IU] via SUBCUTANEOUS
  Administered 2018-08-09: 5 [IU] via SUBCUTANEOUS
  Filled 2018-08-04 (×5): qty 1

## 2018-08-04 MED ORDER — PALIPERIDONE ER 3 MG PO TB24
3.0000 mg | ORAL_TABLET | Freq: Every day | ORAL | Status: DC
  Administered 2018-08-05: 3 mg via ORAL
  Filled 2018-08-04: qty 1

## 2018-08-04 MED ORDER — ATORVASTATIN CALCIUM 20 MG PO TABS
10.0000 mg | ORAL_TABLET | Freq: Every day | ORAL | Status: DC
  Administered 2018-08-05 – 2018-09-18 (×44): 10 mg via ORAL
  Filled 2018-08-04 (×44): qty 1

## 2018-08-04 MED ORDER — ALUM & MAG HYDROXIDE-SIMETH 200-200-20 MG/5ML PO SUSP: 30.0000 mL | ORAL | Status: DC | PRN

## 2018-08-04 MED ORDER — DIVALPROEX SODIUM 500 MG PO DR TAB
500.0000 mg | DELAYED_RELEASE_TABLET | Freq: Two times a day (BID) | ORAL | Status: DC
  Administered 2018-08-04 – 2018-08-05 (×2): 500 mg via ORAL
  Filled 2018-08-04 (×2): qty 1

## 2018-08-04 MED ORDER — ZIPRASIDONE MESYLATE 20 MG IM SOLR
10.0000 mg | Freq: Four times a day (QID) | INTRAMUSCULAR | Status: DC | PRN
  Administered 2018-08-04: 04:00:00 10 mg via INTRAMUSCULAR
  Filled 2018-08-04 (×2): qty 20

## 2018-08-04 MED ORDER — MOXIFLOXACIN HCL 0.5 % OP SOLN
1.0000 [drp] | Freq: Three times a day (TID) | OPHTHALMIC | Status: DC
  Administered 2018-08-05 – 2018-09-19 (×130): 1 [drp] via OPHTHALMIC
  Filled 2018-08-04 (×4): qty 3

## 2018-08-04 MED ORDER — ACETAMINOPHEN 325 MG PO TABS
650.0000 mg | ORAL_TABLET | Freq: Four times a day (QID) | ORAL | Status: DC | PRN
  Administered 2018-08-12 – 2018-09-02 (×14): 650 mg via ORAL
  Filled 2018-08-04 (×14): qty 2

## 2018-08-04 MED ORDER — LORAZEPAM 2 MG/ML IJ SOLN
2.0000 mg | INTRAMUSCULAR | Status: DC | PRN
  Administered 2018-08-04 – 2018-08-05 (×2): 2 mg via INTRAMUSCULAR
  Filled 2018-08-04 (×2): qty 1

## 2018-08-04 MED ORDER — DIVALPROEX SODIUM 250 MG PO DR TAB: 250.0000 mg | DELAYED_RELEASE_TABLET | Freq: Every day | ORAL | Status: DC

## 2018-08-04 MED ORDER — GABAPENTIN 100 MG PO CAPS
200.0000 mg | ORAL_CAPSULE | Freq: Three times a day (TID) | ORAL | Status: DC
  Administered 2018-08-05: 200 mg via ORAL
  Filled 2018-08-04: qty 2

## 2018-08-04 MED ORDER — LEVOTHYROXINE SODIUM 75 MCG PO TABS
150.0000 ug | ORAL_TABLET | Freq: Every day | ORAL | Status: DC
  Administered 2018-08-05 – 2018-09-19 (×45): 150 ug via ORAL
  Filled 2018-08-04 (×45): qty 2

## 2018-08-04 MED ORDER — ZIPRASIDONE MESYLATE 20 MG IM SOLR
20.0000 mg | Freq: Two times a day (BID) | INTRAMUSCULAR | Status: DC | PRN
  Administered 2018-08-04 – 2018-09-13 (×20): 20 mg via INTRAMUSCULAR
  Filled 2018-08-04 (×20): qty 20

## 2018-08-04 MED ORDER — LORAZEPAM 2 MG/ML IJ SOLN
2.0000 mg | Freq: Once | INTRAMUSCULAR | Status: AC
  Administered 2018-08-04: 2 mg via INTRAMUSCULAR
  Filled 2018-08-04: qty 1
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
## 2018-08-04 NOTE — Care Management Important Message (Signed)
Important Message  Patient Details  Name: Jessica [name] MRN: [id number] Date of Birth: 1965-09-03   Medicare Important Message Given:  Other (see comment)  Received message from Kalman Drape @ [place] stating there was a court hearing on Tuesday, July 14 and guardianship for [name] went to a 3rd party, Risk manager. Left a message for [name] at [phone] (3 pm) that I need to review the Medicare Important Message with her since pt. was dischargng today. Will await a return call.  Juliann Pulse A [name] 08/04/2018, 3:05 PM
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
## 2018-08-04 NOTE — Plan of Care (Signed)
Instructed patient on unit programing and [facility] . Patient needing redirection    Problem: Education: Goal: Knowledge of [facility] Education information/materials will improve Outcome: Progressing
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
## 2018-08-04 NOTE — Care Management Important Message (Signed)
Important Message  Patient Details  Name: Jessica [name] MRN: [id number] Date of Birth: July 01, 1965   Medicare Important Message Given:  Other (see comment)  Second call to Kalman Drape, [place] legal guardian [phone]) to call me to review Important Message from Medicare for [name]. [name] 08/04/2018, 9:37 AM

## 2018-08-04 NOTE — BH Assessment (Signed)
Patient has been accepted to [facility].  Accepting physician is Dr. Einar Grad.  Attending Physician will be Dr. Weber Cooks.  Patient has been assigned to room 312, by [place].  Call report to [phone].  Representative/Transfer Coordinator is Print production planner [facility]) Patient pre-admitted by [facility] Patient Access (unavailable)   Unit 1C Staff (Danielle, Agricultural consultant) made aware of acceptance.
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
## 2018-08-05 DIAGNOSIS — F250 Schizoaffective disorder, bipolar type: Principal | ICD-10-CM

## 2018-08-05 LAB — CULTURE, BLOOD (ROUTINE X 2)
Culture: NO GROWTH
Culture: NO GROWTH

## 2018-08-05 LAB — GLUCOSE, CAPILLARY
Glucose-Capillary: 116 mg/dL — ABNORMAL HIGH (ref 70–99)
Glucose-Capillary: 161 mg/dL — ABNORMAL HIGH (ref 70–99)

## 2018-08-05 MED ORDER — LAMOTRIGINE 100 MG PO TABS
100.0000 mg | ORAL_TABLET | Freq: Every day | ORAL | Status: DC
  Administered 2018-08-05 – 2018-08-24 (×19): 100 mg via ORAL
  Filled 2018-08-05 (×19): qty 1

## 2018-08-05 MED ORDER — LORAZEPAM 2 MG/ML IJ SOLN
2.0000 mg | INTRAMUSCULAR | Status: DC | PRN
  Administered 2018-08-11 – 2018-09-07 (×14): 2 mg via INTRAMUSCULAR
  Filled 2018-08-05 (×14): qty 1

## 2018-08-05 MED ORDER — GABAPENTIN 300 MG PO CAPS
600.0000 mg | ORAL_CAPSULE | Freq: Three times a day (TID) | ORAL | Status: DC
  Administered 2018-08-05 – 2018-08-25 (×58): 600 mg via ORAL
  Filled 2018-08-05 (×25): qty 2
  Filled 2018-08-05 (×2): qty 6
  Filled 2018-08-05 (×31): qty 2

## 2018-08-05 MED ORDER — LURASIDONE HCL 40 MG PO TABS
40.0000 mg | ORAL_TABLET | Freq: Two times a day (BID) | ORAL | Status: DC
  Administered 2018-08-05 – 2018-08-10 (×11): 40 mg via ORAL
  Filled 2018-08-05 (×13): qty 1

## 2018-08-05 MED ORDER — DULOXETINE HCL 30 MG PO CPEP
60.0000 mg | ORAL_CAPSULE | Freq: Every day | ORAL | Status: DC
  Administered 2018-08-05 – 2018-09-19 (×46): 60 mg via ORAL
  Filled 2018-08-05 (×47): qty 2

## 2018-08-05 MED ORDER — LORAZEPAM 2 MG PO TABS
2.0000 mg | ORAL_TABLET | ORAL | Status: DC | PRN
  Administered 2018-08-08 – 2018-09-17 (×18): 2 mg via ORAL
  Filled 2018-08-05 (×20): qty 1

## 2018-08-05 MED ORDER — LORATADINE 10 MG PO TABS
10.0000 mg | ORAL_TABLET | Freq: Every day | ORAL | Status: DC
  Administered 2018-08-05 – 2018-09-19 (×45): 10 mg via ORAL
  Filled 2018-08-05 (×45): qty 1

## 2018-08-05 NOTE — Progress Notes (Signed)
Patient stands at the hallway and hollering on top of her voice.Patient takes few minutes naps and continues to be verbally abusive and throwing things.

## 2018-08-05 NOTE — [facility] Group Notes (Signed)
Balance In Life 08/05/2018 1PM  Type of Therapy/Topic:  Group Therapy:  Balance in Life  Participation Level:  Did Not Attend  Description of Group:   This group will address the concept of balance and how it feels and looks when one is unbalanced. Patients will be encouraged to process areas in their lives that are out of balance and identify reasons for remaining unbalanced. Facilitators will guide patients in utilizing problem-solving interventions to address and correct the stressor making their life unbalanced. Understanding and applying boundaries will be explored and addressed for obtaining and maintaining a balanced life. Patients will be encouraged to explore ways to assertively make their unbalanced needs known to significant others in their lives, using other group members and facilitator for support and feedback.  Therapeutic Goals: 1. Patient will identify two or more emotions or situations they have that consume much of in their lives. 2. Patient will identify signs/triggers that life has become out of balance:  3. Patient will identify two ways to set boundaries in order to achieve balance in their lives:  4. Patient will demonstrate ability to communicate their needs through discussion and/or role plays  Summary of Patient Progress:    Therapeutic Modalities:   Cognitive Behavioral Therapy Solution-Focused Therapy Assertiveness Training  Jeffry Vogelsang T Chaselyn Nanney, LCSW  

## 2018-08-05 NOTE — Tx Team (Addendum)
Interdisciplinary Treatment and Diagnostic Plan Update  08/05/2018 Time of Session: 2:30pm Jessica [name] MRN: [id number]  Principal Diagnosis: Schizoaffective disorder, bipolar type ([place])  Secondary Diagnoses: Principal Problem:   Schizoaffective disorder, bipolar type ([place]) Active Problems:   Diabetes mellitus without complication ([place])   Intellectual disability   Agitation   Borderline personality disorder ([place])   HTN (hypertension)   Hypothyroidism   Bipolar disorder, unspecified ([place])   Current Medications:  Current Facility-Administered Medications  Medication Dose Route Frequency Provider Last Rate Last Dose  . acetaminophen (TYLENOL) tablet 650 mg  650 mg Oral Q6H PRN Money, Lowry Ram, FNP      . alum & mag hydroxide-simeth (MAALOX/MYLANTA) 200-200-20 MG/5ML suspension 30 mL  30 mL Oral Q4H PRN Money, Lowry Ram, FNP      . atorvastatin (LIPITOR) tablet 10 mg  10 mg Oral q1800 Money, Lowry Ram, FNP      . DULoxetine (CYMBALTA) DR capsule 60 mg  60 mg Oral Daily Clapacs, Madie Reno, MD   60 mg at 08/05/18 1441  . ferrous sulfate tablet 325 mg  325 mg Oral BID WC Money, Lowry Ram, FNP   325 mg at 08/05/18 0743  . gabapentin (NEURONTIN) capsule 600 mg  600 mg Oral TID Clapacs, John T, MD      . insulin aspart (novoLOG) injection 0-5 Units  0-5 Units Subcutaneous QHS Money, Lowry Ram, FNP      . insulin aspart (novoLOG) injection 0-9 Units  0-9 Units Subcutaneous TID WC Money, Darnelle Maffucci B, FNP      . lamoTRIgine (LAMICTAL) tablet 100 mg  100 mg Oral QHS Clapacs, John T, MD      . levothyroxine (SYNTHROID) tablet 150 mcg  150 mcg Oral Q0600 Money, Lowry Ram, FNP   150 mcg at 08/05/18 1660  . loratadine (CLARITIN) tablet 10 mg  10 mg Oral Daily Clapacs, Madie Reno, MD   10 mg at 08/05/18 1442  . LORazepam (ATIVAN) injection 2 mg  2 mg Intramuscular Q4H PRN Clapacs, Madie Reno, MD   2 mg at 08/05/18 0801  . lurasidone (LATUDA) tablet 40 mg  40 mg Oral BID WC Clapacs, John T, MD      . magnesium  hydroxide (MILK OF MAGNESIA) suspension 30 mL  30 mL Oral Daily PRN Money, Lowry Ram, FNP      . moxifloxacin (VIGAMOX) 0.5 % ophthalmic solution 1 drop  1 drop Right Eye TID Money, Lowry Ram, FNP      . pantoprazole (PROTONIX) EC tablet 40 mg  40 mg Oral Daily Money, Lowry Ram, FNP   40 mg at 08/05/18 0743  . traZODone (DESYREL) tablet 100 mg  100 mg Oral QHS Money, Travis B, FNP   100 mg at 08/04/18 2129  . ziprasidone (GEODON) injection 20 mg  20 mg Intramuscular Q12H PRN Clapacs, Madie Reno, MD   20 mg at 08/05/18 0800   PTA Medications: Medications Prior to Admission  Medication Sig Dispense Refill Last Dose  . atorvastatin (LIPITOR) 10 MG tablet Take 1 tablet (10 mg total) by mouth daily. 30 tablet 1   . carvedilol (COREG) 6.25 MG tablet Take 1 tablet (6.25 mg total) by mouth 2 (two) times daily. 60 tablet 1   . divalproex (DEPAKOTE) 250 MG DR tablet Take 1 tablet (250 mg total) by mouth 2 (two) times daily. 60 tablet 0   . ferrous sulfate (FEROSUL) 325 (65 FE) MG tablet Take 1 tablet (325 mg total) by mouth 2 (  two) times daily. 60 tablet 1   . gabapentin (NEURONTIN) 600 MG tablet Take 1 tablet (600 mg total) by mouth 3 (three) times daily. 90 tablet 1   . glipiZIDE (GLUCOTROL XL) 5 MG 24 hr tablet Take 5 mg by mouth daily.     . insulin detemir (LEVEMIR) 100 UNIT/ML injection Inject 0.1 mLs (10 Units total) into the skin 2 (two) times daily. (Patient not taking: Reported on 07/21/2018) 10 mL 11   . lamoTRIgine (LAMICTAL) 25 MG tablet Take 1 tablet (25 mg total) by mouth 2 (two) times daily. 60 tablet 0   . levothyroxine (SYNTHROID) 150 MCG tablet Take 1 tablet (150 mcg total) by mouth daily at 6 (six) AM. 30 tablet 1   . lisinopril (ZESTRIL) 10 MG tablet Take 1 tablet (10 mg total) by mouth daily. 30 tablet 1   . loratadine (CLARITIN) 10 MG tablet Take 1 tablet (10 mg total) by mouth daily. 30 tablet 1   . metFORMIN (GLUCOPHAGE) 500 MG tablet Take 1 tablet (500 mg total) by mouth 2 (two) times  daily. 60 tablet 1   . moxifloxacin (VIGAMOX) 0.5 % ophthalmic solution Place 1 drop into the right eye 3 (three) times daily. For 5 days 3 mL 0   . paliperidone (INVEGA) 3 MG 24 hr tablet Take 1 tablet (3 mg total) by mouth daily. 30 tablet 0   . pantoprazole (PROTONIX) 40 MG tablet Take 1 tablet (40 mg total) by mouth daily. 30 tablet 1     Patient Stressors: Health problems Legal issue Other: Artist  Patient Strengths: Active sense of humor Religious Affiliation  Treatment Modalities: Medication Management, Group therapy, Case management,  1 to 1 session with clinician, Psychoeducation, Recreational therapy.   Physician Treatment Plan for Primary Diagnosis: Schizoaffective disorder, bipolar type ([place]) Long Term Goal(s): Improvement in symptoms so as ready for discharge Improvement in symptoms so as ready for discharge   Short Term Goals: Ability to demonstrate self-control will improve Ability to identify and develop effective coping behaviors will improve Ability to maintain clinical measurements within normal limits will improve Compliance with prescribed medications will improve  Medication Management: Evaluate patient's response, side effects, and tolerance of medication regimen.  Therapeutic Interventions: 1 to 1 sessions, Unit Group sessions and Medication administration.  Evaluation of Outcomes: Not Progressing  Physician Treatment Plan for Secondary Diagnosis: Principal Problem:   Schizoaffective disorder, bipolar type ([place]) Active Problems:   Diabetes mellitus without complication ([place])   Intellectual disability   Agitation   Borderline personality disorder ([facility])   HTN (hypertension)   Hypothyroidism   Bipolar disorder, unspecified ([place])  Long Term Goal(s): Improvement in symptoms so as ready for discharge Improvement in symptoms so as ready for discharge   Short Term Goals: Ability to demonstrate self-control will improve Ability to identify and  develop effective coping behaviors will improve Ability to maintain clinical measurements within normal limits will improve Compliance with prescribed medications will improve     Medication Management: Evaluate patient's response, side effects, and tolerance of medication regimen.  Therapeutic Interventions: 1 to 1 sessions, Unit Group sessions and Medication administration.  Evaluation of Outcomes: Not Progressing   RN Treatment Plan for Primary Diagnosis: Schizoaffective disorder, bipolar type ([place]) Long Term Goal(s): Knowledge of disease and therapeutic regimen to maintain health will improve  Short Term Goals: Ability to verbalize frustration and anger appropriately will improve, Ability to demonstrate self-control, Ability to participate in decision making will improve, Ability to verbalize feelings  will improve and Ability to identify and develop effective coping behaviors will improve  Medication Management: RN will administer medications as ordered by provider, will assess and evaluate patient's response and provide education to patient for prescribed medication. RN will report any adverse and/or side effects to prescribing provider.  Therapeutic Interventions: 1 on 1 counseling sessions, Psychoeducation, Medication administration, Evaluate responses to treatment, Monitor vital signs and CBGs as ordered, Perform/monitor CIWA, COWS, AIMS and Fall Risk screenings as ordered, Perform wound care treatments as ordered.  Evaluation of Outcomes: Not Progressing   LCSW Treatment Plan for Primary Diagnosis: Schizoaffective disorder, bipolar type ([place]) Long Term Goal(s): Safe transition to appropriate next level of care at discharge, Engage patient in therapeutic group addressing interpersonal concerns.  Short Term Goals: Engage patient in aftercare planning with referrals and resources, Increase social support, Increase ability to appropriately verbalize feelings, Increase emotional  regulation and Facilitate acceptance of mental health diagnosis and concerns  Therapeutic Interventions: Assess for all discharge needs, 1 to 1 time with Social worker, Explore available resources and support systems, Assess for adequacy in community support network, Educate family and significant other(s) on suicide prevention, Complete Psychosocial Assessment, Interpersonal group therapy.  Evaluation of Outcomes: Not Progressing   Progress in Treatment: Attending groups: No. Participating in groups: No. Taking medication as prescribed: Yes. Toleration medication: Yes. Family/Significant other contact made: Yes, individual(s) contacted:  once permission is given. Patient understands diagnosis: Yes. Discussing patient identified problems/goals with staff: No. Medical problems stabilized or resolved: Yes. Denies suicidal/homicidal ideation: Yes. Issues/concerns per patient self-inventory: No. Other: none  New problem(s) identified: No, Describe:  none  New Short Term/Long Term Goal(s): medication management for mood stabilization; elimination of SI thoughts; development of comprehensive mental wellness plan.  Patient Goals:  Patient was unable to attend   Discharge Plan or Barriers: At this time the patient is unable to return to her group home due to aggressive behaviors.  Patient will need support in identifying alternative placement.    Reason for Continuation of Hospitalization: Aggression Depression Medication stabilization  Estimated Length of Stay:  1-7 days  Recreational Therapy: Patient: N/A Patient Goal: Patient will demonstrate no violent or destructive behaviors during recreation therapy group sessions for duration of admission  Attendees: Patient:  08/05/2018 3:01 PM  Physician: Dr. Weber Cooks, MD 08/05/2018 3:01 PM  Nursing: Polly Cobia, RN 08/05/2018 3:01 PM  RN Care Manager: 08/05/2018 3:01 PM  Social Worker: Assunta Curtis, [place] 08/05/2018 3:01 PM   Recreational Therapist: Roanna Epley, Reather Converse, LRT 08/05/2018 3:01 PM  Other: Sanjuana Kava, [place] 08/05/2018 3:01 PM  Other:  08/05/2018 3:01 PM  Other: 08/05/2018 3:01 PM    Scribe for Treatment Team: Rozann Lesches, LCSW 08/05/2018 3:01 PM

## 2018-08-05 NOTE — Progress Notes (Signed)
Recreation Therapy Notes  Date: 08/05/2018  Time: 9:30 am   Location: Craft room   Behavioral response: N/A   Intervention Topic: Leisure  Discussion/Intervention: Patient did not attend group.   Clinical Observations/Feedback:  Patient did not attend group.   Smriti Barkow LRT/CTRS         Chastin Garlitz 08/05/2018 10:36 AM

## 2018-08-05 NOTE — BHH Counselor (Signed)
CSW attempted to complete PSA but unable to do so due to pt displaying labile mood and sedation. Pt assisted by nurse tech to her room to rest. CSW will continue to make attempts.

## 2018-08-05 NOTE — TOC Progression Note (Signed)
Transition of Care [facility]) - Progression Note    Patient Details  Name: MAKINZIE CONSIDINE MRN: [id number] Date of Birth: 25-Oct-1965  [facility] CM/SW Contact  Sonny Poth, Lenice Llamas Phone Number: [phone]  08/05/2018, 2:31 PM  Clinical Narrative:  TTS requested a [place] referral to be started. A referral was faxed to [facility] on 7/16 and authorization with [employer] was started on 7/16. CSW received a call from [place] today 7/17 with authorization # [id number], authorization is good for 30 days. CSW contacted [facility] CSW Darren and made her aware of above.       Expected Discharge Plan: Group Home Barriers to Discharge: Barriers Unresolved (comment)(patient behaviors- unacceptable verbal slurs)  Expected Discharge Plan and Services Expected Discharge Plan: Group Home         Expected Discharge Date: 08/04/18                                     Social Determinants of Health (SDOH) Interventions    Readmission Risk Interventions No flowsheet data found.

## 2018-08-05 NOTE — Progress Notes (Signed)
Patient was verbally aggressive with staff.Calling names and cussing.Breakfast give to patient.Patient threw everything on the floor.Patient refused scheduled medications at this time.PRN injections given.Patient accepted without any resistance.Verbal de escalation given with support and encouragement.

## 2018-08-05 NOTE — Progress Notes (Signed)
Patient alert and oriented x 3 with periods of confusion to situation, no distress noted, thoughts are disorganized and incoherent, she appears restless, disruptive, using profanities and verbally aggressive to staff. Patient was frequently redirected for safety, medicated per MD standing order with Geodon and Lorazepam IM, and she was complaint with medication which had good effect; patient was calmer. Patient continues to be on 1:1 no distress noted, will continue to monitor.

## 2018-08-05 NOTE — [facility] Counselor (Signed)
CSW received phone call from [facility] informing her that she made a [facility] referral on 7/16. She provided the authorization # [id number] and states it is good for 30 days. CSW relayed this information to the physician(J. Clapacs) during today's treatment team meeting.
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
## 2018-08-05 NOTE — Progress Notes (Signed)
Recreation Therapy Notes  INPATIENT RECREATION THERAPY ASSESSMENT  Patient Details Name: Jessica [name] MRN: [id number] DOB: April 02, 1965 Today's Date: 08/05/2018       Information Obtained From: Patient(Patient unable to complete assessment at this time)  Able to Participate in Assessment/Interview:    Patient Presentation:    Reason for Admission (Per Patient):    Patient Stressors:    Coping Skills:      Leisure Interests (2+):     Frequency of Recreation/Participation:    Awareness of Community Resources:     [employer]:     Current Use:    If no, Barriers?:    Expressed Interest in [place] of Residence:     Patient Main Form of Transportation:    Patient Strengths:     Patient Identified Areas of Improvement:     Patient Goal for Hospitalization:     Current SI (including self-harm):     Current HI:     Current AVH:    Staff Intervention Plan:    Consent to Intern Participation:    Trebor Galdamez 08/05/2018, 3:03 PM

## 2018-08-05 NOTE — Plan of Care (Signed)
  Problem: Activity: Goal: Interest or engagement in leisure activities will improve Outcome: Progressing  Patient endorsed interest in leisure activities.

## 2018-08-05 NOTE — Plan of Care (Addendum)
Patient is calm and cooperative at this time.Stated to staff  "you are my buddies".Had dinner.Compliant with medications.Denies SI,HI and AVH at this time.Appreciated patient for her good behavior.Safety maintained with sitter.Patient refused to check blood sugar today.

## 2018-08-05 NOTE — BHH Suicide Risk Assessment (Signed)
[facility] Admission Suicide Risk Assessment   Nursing information obtained from:  Patient Demographic factors:  Caucasian Current Mental Status:  NA Loss Factors:  NA Historical Factors:  Impulsivity, Family history of suicide Risk Reduction Factors:  Positive coping skills or problem solving skills, Positive therapeutic relationship  Total Time spent with patient: 1 hour Principal Problem: Schizoaffective disorder, bipolar type ([place]) Diagnosis:  Principal Problem:   Schizoaffective disorder, bipolar type ([place]) Active Problems:   Diabetes mellitus without complication ([place])   Intellectual disability   Agitation   Borderline personality disorder ([place])   HTN (hypertension)   Hypothyroidism   Bipolar disorder, unspecified ([place])  Subjective Data: Patient seen and chart reviewed.  Patient known to me from previous encounters.  Patient with moderate degree of intellectual disability chronic behavior problems diagnosis of schizoaffective disorder.  Patient continues to be agitated and disorganized.  Hostile and mean and uncooperative in interview.  Not reporting any active suicidal ideation but frequently makes impulsive threats to others and behavior has been poorly controlled.  Continued Clinical Symptoms:  Alcohol Use Disorder Identification Test Final Score (AUDIT): 1 The "Alcohol Use Disorders Identification Test", Guidelines for Use in Primary Care, Second Edition.  World Pharmacologist [facility]). Score between 0-7:  no or low risk or alcohol related problems. Score between 8-15:  moderate risk of alcohol related problems. Score between 16-19:  high risk of alcohol related problems. Score 20 or above:  warrants further diagnostic evaluation for alcohol dependence and treatment.   CLINICAL FACTORS:   Bipolar Disorder:   [place] Personality Disorders:   Cluster B   Musculoskeletal: Strength & Muscle Tone: within normal limits Gait & Station: normal Patient leans:  N/A  Psychiatric Specialty Exam: Physical Exam  Nursing note and vitals reviewed. Constitutional: She appears well-developed and well-nourished.  HENT:  Head: Normocephalic and atraumatic.  Eyes: Pupils are equal, round, and reactive to light. Conjunctivae are normal.  Neck: Normal range of motion.  Cardiovascular: Regular rhythm and normal heart sounds.  Respiratory: Effort normal.  GI: Soft.  Musculoskeletal: Normal range of motion.  Neurological: She is alert.  Skin: Skin is warm and dry.  Psychiatric: Her affect is angry. Her speech is tangential. She is agitated. Thought content is paranoid. Cognition and memory are impaired. She expresses impulsivity and inappropriate judgment. She expresses no homicidal and no suicidal ideation.    Review of Systems  Constitutional: Negative.   HENT: Negative.   Eyes: Negative.   Respiratory: Negative.   Cardiovascular: Negative.   Gastrointestinal: Negative.   Musculoskeletal: Negative.   Skin: Negative.   Neurological: Negative.   Psychiatric/Behavioral: Negative for depression, hallucinations, memory loss, substance abuse and suicidal ideas. The patient is not nervous/anxious and does not have insomnia.     Blood pressure 121/82, pulse 86, temperature 97.6 F (36.4 C), temperature source Oral, resp. rate 18, height 4\' 10"  (1.473 m), weight 72.6 kg, SpO2 96 %.Body mass index is 33.44 kg/m.  General Appearance: Disheveled  Eye Contact:  Minimal  Speech:  Garbled  Volume:  Increased  Mood:  Angry and Irritable  Affect:  Congruent and Inappropriate  Thought Process:  Disorganized  Orientation:  Full (Time, Place, and Person)  Thought Content:  Illogical, Paranoid Ideation, Rumination and Tangential  Suicidal Thoughts:  No  Homicidal Thoughts:  Yes.  without intent/plan  Memory:  Immediate;   Fair Recent;   Poor Remote;   Poor  Judgement:  Impaired  Insight:  Lacking  Psychomotor Activity:  Restlessness  Concentration:  Concentration: Poor  Recall:  Poor  Fund of Knowledge:  Poor  Language:  Poor  Akathisia:  No  Handed:  Right  AIMS (if indicated):     Assets:  Financial Resources/Insurance Resilience  ADL's:  Impaired  Cognition:  Impaired,  Moderate  Sleep:  Number of Hours: 6.75      COGNITIVE FEATURES THAT CONTRIBUTE TO RISK:  Loss of executive function and Thought constriction (tunnel vision)    SUICIDE RISK:   Moderate:  Frequent suicidal ideation with limited intensity, and duration, some specificity in terms of plans, no associated intent, good self-control, limited dysphoria/symptomatology, some risk factors present, and identifiable protective factors, including available and accessible social support.  PLAN OF CARE: Patient is currently on one-to-one precautions.  Medications being adjusted.  Ongoing reassessment of dangerousness and provision of a safe environment.  Suicidality along with other aspects of dangerousness will of course be reassessed prior to discharge.  I certify that inpatient services furnished can reasonably be expected to improve the patient's condition.   Alethia Berthold, MD 08/05/2018, 2:02 PM

## 2018-08-05 NOTE — Progress Notes (Signed)
Patient is sad and tearful at times states "I can not do any good.I need to talk to my family."Patient had lunch and too all PO medications.States "I don't need any shots I will take the pills."Safety maintained with sitter.

## 2018-08-05 NOTE — BHH Counselor (Signed)
CSW spoke with [employer], [facility] [place] [phone]) who reports motion was filed for guardianship on 7/14 by [place]. She states the pt has not been assigned a worker yet because they have not received the qualifying letter. She states Lesly Rubenstein [phone]: [phone]) will go on Monday 7/20 to be appointed the pt's guardian.

## 2018-08-06 DIAGNOSIS — F250 Schizoaffective disorder, bipolar type: Secondary | ICD-10-CM

## 2018-08-06 LAB — GLUCOSE, CAPILLARY
Glucose-Capillary: 193 mg/dL — ABNORMAL HIGH (ref 70–99)
Glucose-Capillary: 220 mg/dL — ABNORMAL HIGH (ref 70–99)
Glucose-Capillary: 205 mg/dL — ABNORMAL HIGH (ref 70–99)

## 2018-08-06 NOTE — Progress Notes (Signed)
Pt present and active on unit. Pt remains on 1:1 for safety. Pt is taking medications without issue as reflected in MAR. Pt denies SI, HI, AVH. No distress noted. No complaints were voiced. Encouragement was offered throughout shift. Pt did discuss that she does not want to return to her group home. Pt was encouraged to discuss her wishes and needs with social work. Pt contracts for safety. Will continue to monitor per MD order.

## 2018-08-06 NOTE — Plan of Care (Signed)
Pt alert and oriented to baseline. Pt is cooperative and calm at this time. Pt is labile, but has so far been redirectable. Pt is on 1:1 for safety due to aggression and impulsivity. Pt contracts for safety. Will continue to monitor.

## 2018-08-06 NOTE — Progress Notes (Signed)
Patient remains on a partial 1:1 for safety when awake. Patient compliant with medications this evening and procedures on the unit. Patient did refuse her blood sugar check stating, "I will get it done in the morning, I never get coverage at night and I am tired of being stuck." Patient given education, patient still refused.

## 2018-08-06 NOTE — Progress Notes (Signed)
Patient remains 1:1 for safety with a sitter present. Patient is asleep and without complaint at this time. Patient remains safe on the unit.  

## 2018-08-06 NOTE — Plan of Care (Signed)
  Problem: Education: Goal: Knowledge of [facility] Education information/materials will improve 08/06/2018 1010 by Leanna Battles, RN Outcome: Progressing 08/06/2018 0943 by Leanna Battles, RN Outcome: Progressing   Problem: Coping: Goal: Ability to verbalize frustrations and anger appropriately will improve 08/06/2018 1010 by Leanna Battles, RN Outcome: Progressing 08/06/2018 0943 by Leanna Battles, RN Outcome: Progressing Goal: Ability to demonstrate self-control will improve 08/06/2018 1010 by Leanna Battles, RN Outcome: Progressing 08/06/2018 0943 by Leanna Battles, RN Outcome: Progressing   Problem: Safety: Goal: Periods of time without injury will increase 08/06/2018 1010 by Leanna Battles, RN Outcome: Progressing 08/06/2018 0943 by Leanna Battles, RN Outcome: Progressing   Problem: Safety: Goal: Ability to redirect hostility and anger into socially appropriate behaviors will improve 08/06/2018 1010 by Leanna Battles, RN Outcome: Progressing 08/06/2018 0943 by Leanna Battles, RN Outcome: Progressing   Problem: Activity: Goal: Interest or engagement in leisure activities will improve 08/06/2018 1010 by Leanna Battles, RN Outcome: Progressing 08/06/2018 0943 by Leanna Battles, RN Outcome: Progressing Goal: Imbalance in normal sleep/wake cycle will improve 08/06/2018 1010 by Leanna Battles, RN Outcome: Progressing 08/06/2018 0943 by Leanna Battles, RN Outcome: Progressing

## 2018-08-06 NOTE — Progress Notes (Signed)
Patient remains 1:1 for safety with a safety sitter present. Patient was pleasant during assessment. Patient was compliant with medications per MD orders this evening. Patient said she didn't want to have any more shots so she was going to be good going forward.

## 2018-08-06 NOTE — [facility] Group Notes (Signed)
LCSW Group Therapy Note   08/06/2018 1:15pm   Type of Therapy and Topic:  Group Therapy:  Trust and Honesty  Participation Level:  Active  Description of Group:    In this group patients will be asked to explore the value of being honest.  Patients will be guided to discuss their thoughts, feelings, and behaviors related to honesty and trusting in others. Patients will process together how trust and honesty relate to forming relationships with peers, family members, and self. Each patient will be challenged to identify and express feelings of being vulnerable. Patients will discuss reasons why people are dishonest and identify alternative outcomes if one was truthful (to self or others). This group will be process-oriented, with patients participating in exploration of their own experiences, giving and receiving support, and processing challenge from other group members.   Therapeutic Goals: 1. Patient will identify why honesty is important to relationships and how honesty overall affects relationships.  2. Patient will identify a situation where they lied or were lied too and the  feelings, thought process, and behaviors surrounding the situation 3. Patient will identify the meaning of being vulnerable, how that feels, and how that correlates to being honest with self and others. 4. Patient will identify situations where they could have told the truth, but instead lied and explain reasons of dishonesty.   Summary of Patient ProgressThe patient reported that she feels "better." The patient was able to explore the value of being honest.  Patient discussed thoughts, feelings, and behaviors related to honesty and trusting in others. The patient processed together with other group members how trust and honesty relate to forming relationships with peers, family members, and self. Pt actively and appropriately engaged in the group. Patient was able to provide support and validation to other group members.  Patient practiced active listening when interacting with the facilitator and other group members.    Therapeutic Modalities:   Cognitive Behavioral Therapy Solution Focused Therapy Motivational Interviewing Brief Therapy  Yael Angerer  CUEBAS-COLON, LCSW 08/06/2018 12:39 PM

## 2018-08-06 NOTE — Progress Notes (Signed)
[facility] MD Progress Note  08/06/2018 10:55 AM Jessica [name]  MRN:  [id number]   Ms Currin is a 53yo F with psych h/o moderate intellectual disability, personality disorder, Schizoaffective disorder, who was admitted to [facility] due to agitation at group home.   Patient seen.  Chart reviewed. Patient discussed with nursing; no unsafe overnight events reported, although the patient is still labile and disrruptive and per RN judgement she was continued on 1:1 today.  Subjective:   Patient is very fixated on her disposition and is willing to go live with her sister in [place]. At the same time she understands that the decision regarding her disposition should be discussed with her guardian. She is not willing to return back to her last group home. She reports "feeling good". Denies any complaints today. Reports "ok" mood, states she slept well last night. She wants to make several phone calls to discuss her disposition plan. She denies suicidal, homicidal thoughts. Denies having any side effects from taking medications.    Principal Problem: Schizoaffective disorder, bipolar type ([place]) Diagnosis: Principal Problem:   Schizoaffective disorder, bipolar type ([place]) Active Problems:   Diabetes mellitus without complication ([place])   Intellectual disability   Agitation   Borderline personality disorder (HCC)   HTN (hypertension)   Hypothyroidism   Bipolar disorder, unspecified ([place])  Total Time spent with patient: 20 minutes  Past Psychiatric History: see H&P  Past Medical History:  Past Medical History:  Diagnosis Date  . Anxiety   . Depression   . Diabetes mellitus without complication ([place])   . GERD (gastroesophageal reflux disease)   . Homicidal ideation   . Hypertension   . Hypothyroidism     Past Surgical History:  Procedure Laterality Date  . COLPOSCOPY    . FRACTURE SURGERY    . TRACHEOSTOMY     Family History: History reviewed. No pertinent family history. Family Psychiatric   History: see H&P Social History:  Social History   Substance and Sexual Activity  Alcohol Use No  . Frequency: Never     Social History   Substance and Sexual Activity  Drug Use Yes  . Types: Marijuana, "Crack" cocaine   Comment: reports she has not done anything in a long time    Social History   Socioeconomic History  . Marital status: Single    Spouse name: Not on file  . Number of children: Not on file  . Years of education: Not on file  . Highest education level: Not on file  Occupational History  . Not on file  Social Needs  . Financial resource strain: Somewhat hard  . Food insecurity    Worry: Patient refused    Inability: Patient refused  . Transportation needs    Medical: Patient refused    Non-medical: Patient refused  Tobacco Use  . Smoking status: Former Research scientist (life sciences)  . Smokeless tobacco: Never Used  Substance and Sexual Activity  . Alcohol use: No    Frequency: Never  . Drug use: Yes    Types: Marijuana, "Crack" cocaine    Comment: reports she has not done anything in a long time  . Sexual activity: Not Currently    Birth control/protection: Abstinence  Lifestyle  . Physical activity    Days per week: Patient refused    Minutes per session: Patient refused  . Stress: Rather much  Relationships  . Social connections    Talks on phone: Patient refused    Gets together: Patient refused  Attends religious service: Patient refused    Active member of club or organization: Patient refused    Attends meetings of clubs or organizations: Patient refused    Relationship status: Patient refused  Other Topics Concern  . Not on file  Social History Narrative  . Not on file   Additional Social History:                         Sleep: Fair  Appetite:  Fair  Current Medications: Current Facility-Administered Medications  Medication Dose Route Frequency Provider Last Rate Last Dose  . acetaminophen (TYLENOL) tablet 650 mg  650 mg Oral Q6H PRN  Money, Lowry Ram, FNP      . alum & mag hydroxide-simeth (MAALOX/MYLANTA) 200-200-20 MG/5ML suspension 30 mL  30 mL Oral Q4H PRN Money, Lowry Ram, FNP      . atorvastatin (LIPITOR) tablet 10 mg  10 mg Oral q1800 Money, Lowry Ram, FNP   10 mg at 08/05/18 1737  . DULoxetine (CYMBALTA) DR capsule 60 mg  60 mg Oral Daily Clapacs, Madie Reno, MD   60 mg at 08/06/18 0818  . ferrous sulfate tablet 325 mg  325 mg Oral BID WC Money, Lowry Ram, FNP   325 mg at 08/06/18 0818  . gabapentin (NEURONTIN) capsule 600 mg  600 mg Oral TID Clapacs, Madie Reno, MD   600 mg at 08/06/18 0818  . insulin aspart (novoLOG) injection 0-5 Units  0-5 Units Subcutaneous QHS Money, Lowry Ram, FNP      . insulin aspart (novoLOG) injection 0-9 Units  0-9 Units Subcutaneous TID WC Money, Lowry Ram, FNP   2 Units at 08/06/18 0818  . lamoTRIgine (LAMICTAL) tablet 100 mg  100 mg Oral QHS Clapacs, John T, MD   100 mg at 08/05/18 2107  . levothyroxine (SYNTHROID) tablet 150 mcg  150 mcg Oral Q0600 Money, Lowry Ram, FNP   150 mcg at 08/06/18 2956  . loratadine (CLARITIN) tablet 10 mg  10 mg Oral Daily Clapacs, Madie Reno, MD   10 mg at 08/06/18 0818  . LORazepam (ATIVAN) tablet 2 mg  2 mg Oral Q4H PRN Clapacs, Madie Reno, MD       Or  . LORazepam (ATIVAN) injection 2 mg  2 mg Intramuscular Q4H PRN Clapacs, John T, MD      . lurasidone (LATUDA) tablet 40 mg  40 mg Oral BID WC Clapacs, John T, MD   40 mg at 08/06/18 0830  . magnesium hydroxide (MILK OF MAGNESIA) suspension 30 mL  30 mL Oral Daily PRN Money, Lowry Ram, FNP      . moxifloxacin (VIGAMOX) 0.5 % ophthalmic solution 1 drop  1 drop Right Eye TID Money, Lowry Ram, FNP   1 drop at 08/06/18 0831  . pantoprazole (PROTONIX) EC tablet 40 mg  40 mg Oral Daily Money, Lowry Ram, FNP   40 mg at 08/06/18 0818  . traZODone (DESYREL) tablet 100 mg  100 mg Oral QHS Money, Travis B, FNP   100 mg at 08/05/18 2107  . ziprasidone (GEODON) injection 20 mg  20 mg Intramuscular Q12H PRN Clapacs, Madie Reno, MD   20 mg at  08/05/18 0800    Lab Results:  Results for orders placed or performed during the hospital encounter of 08/04/18 (from the past 48 hour(s))  Glucose, capillary     Status: Abnormal   Collection Time: 08/04/18  9:37 PM  Result Value Ref Range   Glucose-Capillary  138 (H) 70 - 99 mg/dL  Glucose, capillary     Status: Abnormal   Collection Time: 08/05/18  7:08 AM  Result Value Ref Range   Glucose-Capillary 116 (H) 70 - 99 mg/dL   Comment 1 Notify RN   Glucose, capillary     Status: Abnormal   Collection Time: 08/05/18  9:04 PM  Result Value Ref Range   Glucose-Capillary 161 (H) 70 - 99 mg/dL  Glucose, capillary     Status: Abnormal   Collection Time: 08/06/18  7:08 AM  Result Value Ref Range   Glucose-Capillary 193 (H) 70 - 99 mg/dL    Blood Alcohol level:  Lab Results  Component Value Date   ETH <10 07/19/2018   ETH <10 46/27/0350    Metabolic Disorder Labs: Lab Results  Component Value Date   HGBA1C 5.3 06/05/2018   MPG 105.41 06/05/2018   MPG 102.54 12/30/2017   No results found for: PROLACTIN Lab Results  Component Value Date   CHOL 155 06/05/2018   TRIG 93 06/05/2018   HDL 54 06/05/2018   CHOLHDL 2.9 06/05/2018   VLDL 19 06/05/2018   LDLCALC 82 06/05/2018   [place] 90 12/30/2017    Physical Findings: AIMS:  , ,  ,  ,    CIWA:    COWS:     Musculoskeletal: Strength & Muscle Tone: within normal limits Gait & Station: normal Patient leans: N/A  Psychiatric Specialty Exam: Physical Exam  ROS  Blood pressure (!) 107/49, pulse 83, temperature 97.8 F (36.6 C), temperature source Oral, resp. rate 15, height 4\' 10"  (1.473 m), weight 72.6 kg, SpO2 93 %.Body mass index is 33.44 kg/m.  General Appearance: Casual and Disheveled  Eye Contact:  Good  Speech:  Pressured  Volume:  Normal  Mood:  Euthymic  Affect:  Labile  Thought Process:  Coherent and Disorganized  Orientation:  Full (Time, Place, and Person)  Thought Content:  Illogical  Suicidal Thoughts:   No  Homicidal Thoughts:  No  Memory:  Immediate;   Fair Recent;   Fair Remote;   Fair  Judgement:  Poor  Insight:  Shallow  Psychomotor Activity:  Increased  Concentration:  Concentration: Fair and Attention Span: Poor  Recall:  [employer] of Knowledge:  Fair  Language:  Fair  Akathisia:  No  Handed:  Right  AIMS (if indicated):     Assets:  Desire for Improvement Physical Health  ADL's:  Intact  Cognition:  Impaired,  Mild  Sleep:  Number of Hours: 7.25     Treatment Plan Summary: Daily contact with patient to assess and evaluate symptoms and progress in treatment   53yo F with psych h/o moderate intellectual disability, personality disorder, Schizoaffective disorder, who was admitted to [facility] due to agitation at group home. Patient appears to be calmer, although she is still hyperactive and unpredictable, so she was continued on 1:1 today. She is compliant with medications and her behavior seem to be improving compare to admission. No med changes made today.  Impression:  IDD, moderate. Borderline personality disorder Schizoaffective disorder   Plan: -continue inpatient psych admission; 15-minute checks; daily contact with patient to assess and evaluate symptoms and progress in treatment; psychoeducation.   -continue scheduled psych medications: Latuda 40mg  PO daily for mood stabilization; Cymbalta 60mg  PO daily for depression, anxiety; Lamictal 100mg  po QHS for mood stabilization; Trazodone 100mg  PO QHS for sleep; Gabapentin 600mg  PO TID for pain, anxiety, sleep.   -continue PRN medications.   -  Disposition: to be determined. Patient has a guardian.  Larita Fife, MD 08/06/2018, 10:55 AM

## 2018-08-06 NOTE — Plan of Care (Signed)
Patient stated to this writer, "I am tired of getting shots, I am going to be good going forward."   Problem: Coping: Goal: Ability to demonstrate self-control will improve Outcome: Progressing

## 2018-08-07 DIAGNOSIS — F250 Schizoaffective disorder, bipolar type: Secondary | ICD-10-CM

## 2018-08-07 LAB — GLUCOSE, CAPILLARY
Glucose-Capillary: 291 mg/dL — ABNORMAL HIGH (ref 70–99)
Glucose-Capillary: 199 mg/dL — ABNORMAL HIGH (ref 70–99)
Glucose-Capillary: 280 mg/dL — ABNORMAL HIGH (ref 70–99)
Glucose-Capillary: 292 mg/dL — ABNORMAL HIGH (ref 70–99)

## 2018-08-07 NOTE — Plan of Care (Signed)
Cooperative and compliant with treatment. Active in the milieu and denying thoughts of self harm. No concern.

## 2018-08-07 NOTE — Progress Notes (Signed)
[facility] MD Progress Note  08/07/2018 10:25 AM MARKEL MERGENTHALER  MRN:  [id number]   Ms Tarrant is a 53yo F with psych h/o moderate intellectual disability, personality disorder, Schizoaffective disorder, who was admitted to [facility] due to agitation at group home.   Patient seen.  Chart reviewed. Patient discussed with nursing; no unsafe overnight events reported, patient switched to 1:1 when awake only.  Subjective:   Patient appears calmer. She states she had "great" night and was not afraid to be by herself in the room. She is still focused on her disposition and understands that the decision regarding her disposition should be discussed with her guardian. She is not willing to return back to her last group home. She denies any complaints today. Reports "good" mood. She talks about religions, she states she is [place], but wants to see a Scientist, research (life sciences) to know more about [employer]. She denies suicidal, homicidal thoughts. Denies having any side effects from medications.    Principal Problem: Schizoaffective disorder, bipolar type ([place]) Diagnosis: Principal Problem:   Schizoaffective disorder, bipolar type ([place]) Active Problems:   Diabetes mellitus without complication ([place])   Intellectual disability   Agitation   Borderline personality disorder (HCC)   HTN (hypertension)   Hypothyroidism   Bipolar disorder, unspecified ([place])  Total Time spent with patient: 20 minutes  Past Psychiatric History: see H&P  Past Medical History:  Past Medical History:  Diagnosis Date  . Anxiety   . Depression   . Diabetes mellitus without complication ([place])   . GERD (gastroesophageal reflux disease)   . Homicidal ideation   . Hypertension   . Hypothyroidism     Past Surgical History:  Procedure Laterality Date  . COLPOSCOPY    . FRACTURE SURGERY    . TRACHEOSTOMY     Family History: History reviewed. No pertinent family history. Family Psychiatric  History: see H&P Social History:  Social  History   Substance and Sexual Activity  Alcohol Use No  . Frequency: Never     Social History   Substance and Sexual Activity  Drug Use Yes  . Types: Marijuana, "Crack" cocaine   Comment: reports she has not done anything in a long time    Social History   Socioeconomic History  . Marital status: Single    Spouse name: Not on file  . Number of children: Not on file  . Years of education: Not on file  . Highest education level: Not on file  Occupational History  . Not on file  Social Needs  . Financial resource strain: Somewhat hard  . Food insecurity    Worry: Patient refused    Inability: Patient refused  . Transportation needs    Medical: Patient refused    Non-medical: Patient refused  Tobacco Use  . Smoking status: Former Research scientist (life sciences)  . Smokeless tobacco: Never Used  Substance and Sexual Activity  . Alcohol use: No    Frequency: Never  . Drug use: Yes    Types: Marijuana, "Crack" cocaine    Comment: reports she has not done anything in a long time  . Sexual activity: Not Currently    Birth control/protection: Abstinence  Lifestyle  . Physical activity    Days per week: Patient refused    Minutes per session: Patient refused  . Stress: Rather much  Relationships  . Social connections    Talks on phone: Patient refused    Gets together: Patient refused    Attends religious service: Patient refused  Active member of club or organization: Patient refused    Attends meetings of clubs or organizations: Patient refused    Relationship status: Patient refused  Other Topics Concern  . Not on file  Social History Narrative  . Not on file   Additional Social History:      Sleep: Fair  Appetite:  Fair  Current Medications: Current Facility-Administered Medications  Medication Dose Route Frequency Provider Last Rate Last Dose  . acetaminophen (TYLENOL) tablet 650 mg  650 mg Oral Q6H PRN Money, Lowry Ram, FNP      . alum & mag hydroxide-simeth  (MAALOX/MYLANTA) 200-200-20 MG/5ML suspension 30 mL  30 mL Oral Q4H PRN Money, Lowry Ram, FNP      . atorvastatin (LIPITOR) tablet 10 mg  10 mg Oral q1800 Money, Lowry Ram, FNP   10 mg at 08/06/18 1700  . DULoxetine (CYMBALTA) DR capsule 60 mg  60 mg Oral Daily Clapacs, Madie Reno, MD   60 mg at 08/07/18 0757  . ferrous sulfate tablet 325 mg  325 mg Oral BID WC Money, Lowry Ram, FNP   325 mg at 08/07/18 0757  . gabapentin (NEURONTIN) capsule 600 mg  600 mg Oral TID Clapacs, Madie Reno, MD   600 mg at 08/07/18 0757  . insulin aspart (novoLOG) injection 0-5 Units  0-5 Units Subcutaneous QHS Money, Lowry Ram, FNP      . insulin aspart (novoLOG) injection 0-9 Units  0-9 Units Subcutaneous TID WC Money, Lowry Ram, FNP   5 Units at 08/07/18 0657  . lamoTRIgine (LAMICTAL) tablet 100 mg  100 mg Oral QHS Clapacs, Madie Reno, MD   100 mg at 08/06/18 2202  . levothyroxine (SYNTHROID) tablet 150 mcg  150 mcg Oral Q0600 Money, Lowry Ram, FNP   150 mcg at 08/07/18 3710  . loratadine (CLARITIN) tablet 10 mg  10 mg Oral Daily Clapacs, Madie Reno, MD   10 mg at 08/07/18 0757  . LORazepam (ATIVAN) tablet 2 mg  2 mg Oral Q4H PRN Clapacs, Madie Reno, MD       Or  . LORazepam (ATIVAN) injection 2 mg  2 mg Intramuscular Q4H PRN Clapacs, John T, MD      . lurasidone (LATUDA) tablet 40 mg  40 mg Oral BID WC Clapacs, Madie Reno, MD   40 mg at 08/07/18 0757  . magnesium hydroxide (MILK OF MAGNESIA) suspension 30 mL  30 mL Oral Daily PRN Money, Lowry Ram, FNP      . moxifloxacin (VIGAMOX) 0.5 % ophthalmic solution 1 drop  1 drop Right Eye TID Money, Lowry Ram, FNP   1 drop at 08/07/18 0758  . pantoprazole (PROTONIX) EC tablet 40 mg  40 mg Oral Daily Money, Lowry Ram, FNP   40 mg at 08/07/18 0757  . traZODone (DESYREL) tablet 100 mg  100 mg Oral QHS Money, Travis B, FNP   100 mg at 08/06/18 2202  . ziprasidone (GEODON) injection 20 mg  20 mg Intramuscular Q12H PRN Clapacs, Madie Reno, MD   20 mg at 08/05/18 0800    Lab Results:  Results for orders placed or  performed during the hospital encounter of 08/04/18 (from the past 48 hour(s))  Glucose, capillary     Status: Abnormal   Collection Time: 08/05/18  9:04 PM  Result Value Ref Range   Glucose-Capillary 161 (H) 70 - 99 mg/dL  Glucose, capillary     Status: Abnormal   Collection Time: 08/06/18  7:08 AM  Result Value Ref  Range   Glucose-Capillary 193 (H) 70 - 99 mg/dL  Glucose, capillary     Status: Abnormal   Collection Time: 08/06/18 11:36 AM  Result Value Ref Range   Glucose-Capillary 220 (H) 70 - 99 mg/dL   Comment 1 Notify RN   Glucose, capillary     Status: Abnormal   Collection Time: 08/06/18  4:21 PM  Result Value Ref Range   Glucose-Capillary 205 (H) 70 - 99 mg/dL  Glucose, capillary     Status: Abnormal   Collection Time: 08/07/18  6:50 AM  Result Value Ref Range   Glucose-Capillary 291 (H) 70 - 99 mg/dL    Blood Alcohol level:  Lab Results  Component Value Date   ETH <10 07/19/2018   ETH <10 95/63/8756    Metabolic Disorder Labs: Lab Results  Component Value Date   HGBA1C 5.3 06/05/2018   MPG 105.41 06/05/2018   MPG 102.54 12/30/2017   No results found for: PROLACTIN Lab Results  Component Value Date   CHOL 155 06/05/2018   TRIG 93 06/05/2018   HDL 54 06/05/2018   CHOLHDL 2.9 06/05/2018   VLDL 19 06/05/2018   LDLCALC 82 06/05/2018   LDLCALC 90 12/30/2017    Physical Findings: AIMS:  , ,  ,  ,    CIWA:    COWS:     Musculoskeletal: Strength & Muscle Tone: within normal limits Gait & Station: normal Patient leans: N/A  Psychiatric Specialty Exam: Physical Exam   ROS   Blood pressure 127/61, pulse 97, temperature 98.5 F (36.9 C), temperature source Oral, resp. rate 17, height 4\' 10"  (1.473 m), weight 72.6 kg, SpO2 (!) 89 %.Body mass index is 33.44 kg/m.  General Appearance: casual, better groomed than yesterday  Eye Contact:  Good  Speech:  Less pressured than yesterday  Volume:  Normal  Mood:  Euthymic  Affect:  Appropriate, calm   Thought Process:  Coherent, better organized  Orientation:  Full (Time, Place, and Person)  Thought Content:  Illogical somewhat, flight of ideas  Suicidal Thoughts:  No  Homicidal Thoughts:  No  Memory:  Immediate;   Fair Recent;   Fair Remote;   Fair  Judgement:  Poor  Insight:  Shallow  Psychomotor Activity:  Increased  Concentration:  Concentration: Fair and Attention Span: Poor  Recall:  [employer] of Knowledge:  Fair  Language:  Fair  Akathisia:  No  Handed:  Right  AIMS (if indicated):     Assets:  Desire for Improvement Physical Health  ADL's:  Intact  Cognition:  Impaired,  Mild  Sleep:  Number of Hours: 7.25     Treatment Plan Summary: Daily contact with patient to assess and evaluate symptoms and progress in treatment   53yo F with psych h/o moderate intellectual disability, personality disorder, Schizoaffective disorder, who was admitted to [facility] due to agitation at group home. Patient appears to be calmer, although she is still hyperactive and can be unpredictable, so she was continued on 1:1 while awake, she spent a night by herself without behavioral issues. She is compliant with medications and her behavior seem to be improving compare to admission. No med changes made today.  Impression:  IDD, moderate. Borderline personality disorder Schizoaffective disorder   Plan: -continue inpatient psych admission; 15-minute checks; daily contact with patient to assess and evaluate symptoms and progress in treatment; psychoeducation.   -continue scheduled psych medications: Latuda 40mg  PO daily for mood stabilization; Cymbalta 60mg  PO daily for depression, anxiety; Lamictal  100mg  po QHS for mood stabilization; Trazodone 100mg  PO QHS for sleep; Gabapentin 600mg  PO TID for pain, anxiety, sleep.   -continue PRN medications.   -Disposition: to be determined. Patient has a guardian.  Larita Fife, MD 08/07/2018, 10:25 AM

## 2018-08-07 NOTE — [facility] Group Notes (Signed)
LCSW Group Therapy Note 08/07/2018 1:15pm  Type of Therapy and Topic: Group Therapy: Feelings Around Returning Home & Establishing a Supportive Framework and Supporting Oneself When Supports Not Available  Participation Level: Did Not Attend  Description of Group:  Patients first processed thoughts and feelings about upcoming discharge. These included fears of upcoming changes, lack of change, new living environments, judgements and expectations from others and overall stigma of mental health issues. The group then discussed the definition of a supportive framework, what that looks and feels like, and how do to discern it from an unhealthy non-supportive network. The group identified different types of supports as well as what to do when your family/friends are less than helpful or unavailable  Therapeutic Goals  1. Patient will identify one healthy supportive network that they can use at discharge. 2. Patient will identify one factor of a supportive framework and how to tell it from an unhealthy network. 3. Patient able to identify one coping skill to use when they do not have positive supports from others. 4. Patient will demonstrate ability to communicate their needs through discussion and/or role plays.  Summary of Patient Progress:  Pt was invited to attend group but chose not to attend. CSW will continue to encourage pt to attend group throughout their admission.    Therapeutic Modalities Cognitive Behavioral Therapy Motivational Interviewing   Katherleen Folkes  CUEBAS-COLON, LCSW 08/07/2018 9:48 AM

## 2018-08-07 NOTE — Progress Notes (Signed)
Patient is on 1:1 while awake. Patient is asleep without complaint at this time. Patient remains safe on the unit.

## 2018-08-07 NOTE — Progress Notes (Signed)
Patient went to her room after lunch and took a nap. Currently out of bed and visible in the milieu. Pleasant and cooperative. Monitored on 1:1 as indicated.

## 2018-08-07 NOTE — Plan of Care (Signed)
  Pt pleasant this shift with no aggressive behavior.  Continue On 1:1 while awake for aggressive behavior.Took all medication and currently resting in bed with respiration noted. Will continue to monitor.    Problem: Safety: Goal: Periods of time without injury will increase Outcome: Progressing  or aggressive behavior. Took all medication and currently resting in bed with respiration noted. Will continue to monitor.  Problem: Coping: Goal: Ability to verbalize frustrations and anger appropriately will improve Outcome: Progressing Goal: Ability to demonstrate self-control will improve Outcome: Progressing   Problem: Safety: Goal: Periods of time without injury will increase Outcome: Progressing

## 2018-08-07 NOTE — Progress Notes (Signed)
Patient is visible and active in the milieu. Pleasant and cooperative. Ate dinner and appetite is good. Expressed some concern related to placement, reporting that she would not like going back to the same group home but would not elaborate. . Was encouraged to express the concern to social worker. Patient is otherwise improving in mood and  thought process. Staff continue to provide support and encouragements. Safety precautions maintained on 1:1.

## 2018-08-07 NOTE — Progress Notes (Addendum)
Patient was in room at the beginning of this shift. Got out at breakfast time and went to the dayroom. Alert and oriented. Pleasant and cooperative. Denying thoughts of self harm. Reporting that she is feeling better. States she is willing to return to her group home if there is no other choice  "they are good people, it is just..sometimes I have problems just like everybody else". Patient is knowledgeable of her medication regime. Reports that she is sleeping and eating well. No sign of distress noted. Encouragements provided. Safety monitored on 1:1 as ordered.

## 2018-08-07 NOTE — Plan of Care (Signed)
Patient had a good evening, was compliant with medication administration and procedures on the unit. Patient had no outburst this evening.   Problem: Coping: Goal: Ability to demonstrate self-control will improve Outcome: Progressing
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
## 2018-08-08 LAB — GLUCOSE, CAPILLARY
Glucose-Capillary: 217 mg/dL — ABNORMAL HIGH (ref 70–99)
Glucose-Capillary: 206 mg/dL — ABNORMAL HIGH (ref 70–99)
Glucose-Capillary: 308 mg/dL — ABNORMAL HIGH (ref 70–99)
Glucose-Capillary: 258 mg/dL — ABNORMAL HIGH (ref 70–99)

## 2018-08-08 NOTE — Progress Notes (Signed)
Patient is out of bed and visible, active in the milieu. Alert and oriented and denying suicidal thoughts. Denying hallucinations. Continues to report that she would not like to return to her group home. No aggressive behaviors noted. Patient ate breakfast and received medications. Safety monitored on 1:1.

## 2018-08-08 NOTE — [facility] Group Notes (Signed)
LCSW Group Therapy Note   08/08/2018 12:38 PM   Type of Therapy and Topic:  Group Therapy:  Overcoming Obstacles   Participation Level:  Did Not Attend   Description of Group:    In this group patients will be encouraged to explore what they see as obstacles to their own wellness and recovery. They will be guided to discuss their thoughts, feelings, and behaviors related to these obstacles. The group will process together ways to cope with barriers, with attention given to specific choices patients can make. Each patient will be challenged to identify changes they are motivated to make in order to overcome their obstacles. This group will be process-oriented, with patients participating in exploration of their own experiences as well as giving and receiving support and challenge from other group members.   Therapeutic Goals: 1. Patient will identify personal and current obstacles as they relate to admission. 2. Patient will identify barriers that currently interfere with their wellness or overcoming obstacles.  3. Patient will identify feelings, thought process and behaviors related to these barriers. 4. Patient will identify two changes they are willing to make to overcome these obstacles:      Summary of Patient Progress x     Therapeutic Modalities:   Cognitive Behavioral Therapy Solution Focused Therapy Motivational Interviewing Relapse Prevention Therapy  Evalina Field, MSW, LCSW Clinical Social Work 08/08/2018 12:38 PM

## 2018-08-08 NOTE — Progress Notes (Signed)
Recreation Therapy Notes  Date: 08/08/2018  Time: 9:30 am  Location: Craft room  Behavioral response: Appropriate   Intervention Topic: Time Management  Discussion/Intervention:  Group content today was focused on time management. The group defined time management and identified healthy ways to manage time. Individuals expressed how much of the 24 hours they use in a day. Patients expressed how much time they use just for themselves personally. The group expressed how they have managed their time in the past. Individuals participated in the intervention "Managing Life" where they had a chance to see how much of the 24 hours they use and where it goes. Clinical Observations/Feedback:  Patient came to group and defined time management getting some where on time. She stated she gets things accomplished when she manages her time. Participant identified cleaning and walking as part of her daily activities. Individual was social with peers and staff while participating in the intervention.  Nole Robey LRT/CTRS         Jessica [name] 08/08/2018 11:35 AM

## 2018-08-08 NOTE — Progress Notes (Signed)
[facility] MD Progress Note  08/08/2018 4:19 PM Jessica [name]  MRN:  [id number] Subjective: I still get agitated easily, I am not sure I am safe to be by myself  Patient is a 53 year old female transferred from the medical floor for stabilization and treatment of agitated behavior, patient having thoughts of hurting others, having affective instability.  Patient reports that she still struggles with feeling angry and irritable, requires redirection, states that she is not sure she can be without a sitter as she is afraid she will hurt someone when she gets agitated.  Patient reports that she seems to be doing better on her medications, as that she is taking them regularly as prescribed.  Patient to continue with a one-to-one sitter for safety of others and self Principal Problem: Schizoaffective disorder, bipolar type ([place]) Diagnosis: Principal Problem:   Schizoaffective disorder, bipolar type ([place]) Active Problems:   Diabetes mellitus without complication ([place])   Intellectual disability   Agitation   Borderline personality disorder ([place])   HTN (hypertension)   Hypothyroidism   Bipolar disorder, unspecified ([place])  Total Time spent with patient: 30 minutes  Past Psychiatric History: Unchanged from previous day  Past Medical History:  Past Medical History:  Diagnosis Date  . Anxiety   . Depression   . Diabetes mellitus without complication ([place])   . GERD (gastroesophageal reflux disease)   . Homicidal ideation   . Hypertension   . Hypothyroidism     Past Surgical History:  Procedure Laterality Date  . COLPOSCOPY    . FRACTURE SURGERY    . TRACHEOSTOMY     Family History: History reviewed. No pertinent family history. Family Psychiatric  History: Unchanged from yesterday Social History:  Social History   Substance and Sexual Activity  Alcohol Use No  . Frequency: Never     Social History   Substance and Sexual Activity  Drug Use Yes  . Types: Marijuana, "Crack" cocaine   Comment: reports she has not done anything in a long time    Social History   Socioeconomic History  . Marital status: Single    Spouse name: Not on file  . Number of children: Not on file  . Years of education: Not on file  . Highest education level: Not on file  Occupational History  . Not on file  Social Needs  . Financial resource strain: Somewhat hard  . Food insecurity    Worry: Patient refused    Inability: Patient refused  . Transportation needs    Medical: Patient refused    Non-medical: Patient refused  Tobacco Use  . Smoking status: Former Research scientist (life sciences)  . Smokeless tobacco: Never Used  Substance and Sexual Activity  . Alcohol use: No    Frequency: Never  . Drug use: Yes    Types: Marijuana, "Crack" cocaine    Comment: reports she has not done anything in a long time  . Sexual activity: Not Currently    Birth control/protection: Abstinence  Lifestyle  . Physical activity    Days per week: Patient refused    Minutes per session: Patient refused  . Stress: Rather much  Relationships  . Social Herbalist on phone: Patient refused    Gets together: Patient refused    Attends religious service: Patient refused    Active member of club or organization: Patient refused    Attends meetings of clubs or organizations: Patient refused    Relationship status: Patient refused  Other Topics Concern  .  Not on file  Social History Narrative  . Not on file   Additional Social History:                         Sleep: Fair  Appetite:  Fair  Current Medications: Current Facility-Administered Medications  Medication Dose Route Frequency Provider Last Rate Last Dose  . acetaminophen (TYLENOL) tablet 650 mg  650 mg Oral Q6H PRN Money, Lowry Ram, FNP      . alum & mag hydroxide-simeth (MAALOX/MYLANTA) 200-200-20 MG/5ML suspension 30 mL  30 mL Oral Q4H PRN Money, Lowry Ram, FNP      . atorvastatin (LIPITOR) tablet 10 mg  10 mg Oral q1800 Money, Lowry Ram, FNP    10 mg at 08/07/18 1801  . DULoxetine (CYMBALTA) DR capsule 60 mg  60 mg Oral Daily Clapacs, Madie Reno, MD   60 mg at 08/08/18 0809  . ferrous sulfate tablet 325 mg  325 mg Oral BID WC Money, Lowry Ram, FNP   325 mg at 08/08/18 0809  . gabapentin (NEURONTIN) capsule 600 mg  600 mg Oral TID Clapacs, Madie Reno, MD   600 mg at 08/08/18 1131  . insulin aspart (novoLOG) injection 0-5 Units  0-5 Units Subcutaneous QHS Money, Lowry Ram, FNP   3 Units at 08/07/18 2203  . insulin aspart (novoLOG) injection 0-9 Units  0-9 Units Subcutaneous TID WC Money, Lowry Ram, FNP   3 Units at 08/08/18 1132  . lamoTRIgine (LAMICTAL) tablet 100 mg  100 mg Oral QHS Clapacs, John T, MD   100 mg at 08/07/18 2135  . levothyroxine (SYNTHROID) tablet 150 mcg  150 mcg Oral Q0600 Money, Lowry Ram, FNP   150 mcg at 08/08/18 0551  . loratadine (CLARITIN) tablet 10 mg  10 mg Oral Daily Clapacs, Madie Reno, MD   10 mg at 08/08/18 0809  . LORazepam (ATIVAN) tablet 2 mg  2 mg Oral Q4H PRN Clapacs, Madie Reno, MD       Or  . LORazepam (ATIVAN) injection 2 mg  2 mg Intramuscular Q4H PRN Clapacs, John T, MD      . lurasidone (LATUDA) tablet 40 mg  40 mg Oral BID WC Clapacs, Madie Reno, MD   40 mg at 08/08/18 0810  . magnesium hydroxide (MILK OF MAGNESIA) suspension 30 mL  30 mL Oral Daily PRN Money, Lowry Ram, FNP      . moxifloxacin (VIGAMOX) 0.5 % ophthalmic solution 1 drop  1 drop Right Eye TID Money, Lowry Ram, FNP   1 drop at 08/08/18 1131  . pantoprazole (PROTONIX) EC tablet 40 mg  40 mg Oral Daily Money, Lowry Ram, FNP   40 mg at 08/08/18 0809  . traZODone (DESYREL) tablet 100 mg  100 mg Oral QHS Money, Travis B, FNP   100 mg at 08/07/18 2140  . ziprasidone (GEODON) injection 20 mg  20 mg Intramuscular Q12H PRN Clapacs, Madie Reno, MD   20 mg at 08/05/18 0800    Lab Results:  Results for orders placed or performed during the hospital encounter of 08/04/18 (from the past 48 hour(s))  Glucose, capillary     Status: Abnormal   Collection Time: 08/06/18  4:21  PM  Result Value Ref Range   Glucose-Capillary 205 (H) 70 - 99 mg/dL  Glucose, capillary     Status: Abnormal   Collection Time: 08/07/18  6:50 AM  Result Value Ref Range   Glucose-Capillary 291 (H) 70 - 99 mg/dL  Glucose, capillary     Status: Abnormal   Collection Time: 08/07/18 11:35 AM  Result Value Ref Range   Glucose-Capillary 199 (H) 70 - 99 mg/dL  Glucose, capillary     Status: Abnormal   Collection Time: 08/07/18  4:25 PM  Result Value Ref Range   Glucose-Capillary 280 (H) 70 - 99 mg/dL  Glucose, capillary     Status: Abnormal   Collection Time: 08/07/18  8:27 PM  Result Value Ref Range   Glucose-Capillary 292 (H) 70 - 99 mg/dL   Comment 1 Notify RN   Glucose, capillary     Status: Abnormal   Collection Time: 08/08/18  7:00 AM  Result Value Ref Range   Glucose-Capillary 217 (H) 70 - 99 mg/dL   Comment 1 Notify RN   Glucose, capillary     Status: Abnormal   Collection Time: 08/08/18 11:30 AM  Result Value Ref Range   Glucose-Capillary 206 (H) 70 - 99 mg/dL  Glucose, capillary     Status: Abnormal   Collection Time: 08/08/18  4:16 PM  Result Value Ref Range   Glucose-Capillary 308 (H) 70 - 99 mg/dL    Blood Alcohol level:  Lab Results  Component Value Date   ETH <10 07/19/2018   ETH <10 40/98/1191    Metabolic Disorder Labs: Lab Results  Component Value Date   HGBA1C 5.3 06/05/2018   MPG 105.41 06/05/2018   MPG 102.54 12/30/2017   No results found for: PROLACTIN Lab Results  Component Value Date   CHOL 155 06/05/2018   TRIG 93 06/05/2018   HDL 54 06/05/2018   CHOLHDL 2.9 06/05/2018   VLDL 19 06/05/2018   LDLCALC 82 06/05/2018   [place] 90 12/30/2017    Physical Findings: AIMS:  , ,  ,  ,    CIWA:    COWS:     Musculoskeletal: Strength & Muscle Tone: within normal limits Gait & Station: normal Patient leans: N/A  Psychiatric Specialty Exam: Physical Exam  Review of Systems  Constitutional: Negative.  Negative for fever and  malaise/fatigue.  HENT: Negative.  Negative for congestion and sore throat.   Eyes: Negative.  Negative for blurred vision, double vision, discharge and redness.  Respiratory: Negative.  Negative for cough, shortness of breath and wheezing.   Cardiovascular: Negative.  Negative for chest pain and palpitations.  Gastrointestinal: Negative.  Negative for abdominal pain, constipation, diarrhea, heartburn, nausea and vomiting.  Musculoskeletal: Negative.  Negative for falls and myalgias.  Skin: Negative.  Negative for rash.  Neurological: Negative for dizziness, seizures, loss of consciousness and headaches.  Endo/Heme/Allergies: Negative.  Negative for environmental allergies.  Psychiatric/Behavioral: Negative for depression, hallucinations, substance abuse and suicidal ideas. The patient is not nervous/anxious and does not have insomnia.        Agitation along with labile mood and implusivity    Blood pressure 121/73, pulse 83, temperature 98 F (36.7 C), temperature source Oral, resp. rate 17, height 4\' 10"  (1.473 m), weight 72.6 kg, SpO2 97 %.Body mass index is 33.44 kg/m.  General Appearance: Casual  Eye Contact:  Fair  Speech:  Clear and Coherent and Normal Rate  Volume:  Increased  Mood:  Irritable  Affect:  Inappropriate and Labile  Thought Process:  Coherent, Linear and Descriptions of Associations: Intact  Orientation:  Other:  place and person  Thought Content:  Illogical and Rumination  Suicidal Thoughts:  No  Homicidal Thoughts:  Yes.  without intent/plan  Memory:  Immediate;   Fair Recent;  Fair Remote;   Fair  Judgement:  Poor  Insight:  Lacking  Psychomotor Activity:  Mannerisms  Concentration:  Concentration: Fair and Attention Span: Fair  Recall:  [employer] of Knowledge:  Fair  Language:  Fair  Akathisia:  No  Handed:  Right  AIMS (if indicated):     Assets:  Financial Resources/Insurance Leisure Time Social Support  ADL's:  Impaired  Cognition:  Impaired,   Moderate  Sleep:  Number of Hours: 7.15     Treatment Plan Summary: Daily contact with patient to assess and evaluate symptoms and progress in treatment, Medication management and Plan Social worker working on finding placement for patient.  Patient no longer her guardian Plan:  Review of chart, vital signs, medications, and notes. 1-Individual and group therapy 2-Medication management for schizoaffective disorder:  Medications reviewed with the patient and he stated no untoward effects, no changes made To continue Cymbalta 60 mg daily for depression and anxiety Continue Latuda 40 mg twice daily for mood stabilization To continue Neurontin 600 mg 3 times daily to help with affective instability and pain To continue Lamictal 100 mg at bedtime for mood stabilization. To continue trazodone 100 mg at bedtime for sleep.  Patient also has Ativan as needed for anxiety ordered every 4 hours as needed 3-Coping skills for depression, anxiety, and agitation 4-Continue crisis stabilization and management 5-Address health issues--monitoring vital signs, stable.  Patient is on CBG monitoring and medications for her diabetes and is on NovoLog 3 times daily with meals along with a sliding scale Patient is also on gastric reflux medication  Hampton Abbot, MD 08/08/2018, 4:19 PM

## 2018-08-09 LAB — GLUCOSE, CAPILLARY
Glucose-Capillary: 264 mg/dL — ABNORMAL HIGH (ref 70–99)
Glucose-Capillary: 386 mg/dL — ABNORMAL HIGH (ref 70–99)
Glucose-Capillary: 300 mg/dL — ABNORMAL HIGH (ref 70–99)
Glucose-Capillary: 204 mg/dL — ABNORMAL HIGH (ref 70–99)

## 2018-08-09 NOTE — BHH Counselor (Signed)
CSW contacted [place] screening and admissions dept on 7/20 to check status of referral submitted by Bailey(medical social worker) on 7/16. CSW spoke with Ron who reports they did not receive the referral. CSW submitted a new referral, supporting documentation and authorization # [id number]. Physicians(J.Clapacs and ADwyane Dee) in agreement with pt being referred to [facility].

## 2018-08-09 NOTE — Plan of Care (Signed)
  Problem: Activity: Goal: Imbalance in normal sleep/wake cycle will improve Outcome: Progressing  Patient took sleep aids and stated she will try to lay down and sleep.

## 2018-08-09 NOTE — BHH Counselor (Signed)
CSW contacted St[employer], owner [phone] ext [phone]) and informed they have been awarded guardianship of the pt and she has been assigned to [facility]). CSW left a confidential vm for Vonda, awaiting call back.

## 2018-08-09 NOTE — Progress Notes (Signed)
[facility] MD Progress Note  08/09/2018 11:34 AM Jessica [name]  MRN:  [id number]   Subjective: Patient reports that she is doing much better today.  She states that she slept well last night and is eating good.  She states that she does feels really good today.  Patient states that she was happy to come off of the 1: 1 and does not need a sitter anymore.  Patient reports that she feels safe today.  Patient denies any suicidal homicidal ideations and denies any hallucinations today.  She does report that she had a lingering hallucination yesterday but that went away.  Patient reports that she is hoping to find some more new to live but then states that she is not in a hurry to leave because she wants to make sure that she is stable.  Objective: Patient's chart and findings reviewed and discussed with treatment team.  Patient presents in the room and is pleasant, calm, and cooperative.  Patient is fairly groomed today and is walking around her room and has been pleasant on the unit.  There have been no complaints about the patient thus far today.  I have the feeling that the patient enjoys staying here at the hospital and that is why she is in no hurry to be discharged to go to a group home.  Social work is working on getting placement for the patient at this time.  Principal Problem: Schizoaffective disorder, bipolar type ([place]) Diagnosis: Principal Problem:   Schizoaffective disorder, bipolar type ([place]) Active Problems:   Diabetes mellitus without complication ([place])   Intellectual disability   Agitation   Borderline personality disorder (HCC)   HTN (hypertension)   Hypothyroidism   Bipolar disorder, unspecified ([place])  Total Time spent with patient: 20 minutes  Past Psychiatric History: See previous  Past Medical History:  Past Medical History:  Diagnosis Date  . Anxiety   . Depression   . Diabetes mellitus without complication ([place])   . GERD (gastroesophageal reflux disease)   . Homicidal  ideation   . Hypertension   . Hypothyroidism     Past Surgical History:  Procedure Laterality Date  . COLPOSCOPY    . FRACTURE SURGERY    . TRACHEOSTOMY     Family History: History reviewed. No pertinent family history. Family Psychiatric  History: See previous Social History:  Social History   Substance and Sexual Activity  Alcohol Use No  . Frequency: Never     Social History   Substance and Sexual Activity  Drug Use Yes  . Types: Marijuana, "Crack" cocaine   Comment: reports she has not done anything in a long time    Social History   Socioeconomic History  . Marital status: Single    Spouse name: Not on file  . Number of children: Not on file  . Years of education: Not on file  . Highest education level: Not on file  Occupational History  . Not on file  Social Needs  . Financial resource strain: Somewhat hard  . Food insecurity    Worry: Patient refused    Inability: Patient refused  . Transportation needs    Medical: Patient refused    Non-medical: Patient refused  Tobacco Use  . Smoking status: Former Research scientist (life sciences)  . Smokeless tobacco: Never Used  Substance and Sexual Activity  . Alcohol use: No    Frequency: Never  . Drug use: Yes    Types: Marijuana, "Crack" cocaine    Comment: reports she has not  done anything in a long time  . Sexual activity: Not Currently    Birth control/protection: Abstinence  Lifestyle  . Physical activity    Days per week: Patient refused    Minutes per session: Patient refused  . Stress: Rather much  Relationships  . Social Herbalist on phone: Patient refused    Gets together: Patient refused    Attends religious service: Patient refused    Active member of club or organization: Patient refused    Attends meetings of clubs or organizations: Patient refused    Relationship status: Patient refused  Other Topics Concern  . Not on file  Social History Narrative  . Not on file   Additional Social History:                          Sleep: Good  Appetite:  Good  Current Medications: Current Facility-Administered Medications  Medication Dose Route Frequency Provider Last Rate Last Dose  . acetaminophen (TYLENOL) tablet 650 mg  650 mg Oral Q6H PRN Darrol Brandenburg, Lowry Ram, FNP      . alum & mag hydroxide-simeth (MAALOX/MYLANTA) 200-200-20 MG/5ML suspension 30 mL  30 mL Oral Q4H PRN Mahlia Fernando, Lowry Ram, FNP      . atorvastatin (LIPITOR) tablet 10 mg  10 mg Oral q1800 Lovena Kluck, Lowry Ram, FNP   10 mg at 08/08/18 1814  . DULoxetine (CYMBALTA) DR capsule 60 mg  60 mg Oral Daily Clapacs, Madie Reno, MD   60 mg at 08/09/18 5784  . ferrous sulfate tablet 325 mg  325 mg Oral BID WC Kiyoshi Schaab, Lowry Ram, FNP   325 mg at 08/09/18 6962  . gabapentin (NEURONTIN) capsule 600 mg  600 mg Oral TID Clapacs, Madie Reno, MD   600 mg at 08/09/18 9528  . insulin aspart (novoLOG) injection 0-5 Units  0-5 Units Subcutaneous QHS Mithcell Schumpert, Lowry Ram, FNP   3 Units at 08/08/18 2137  . insulin aspart (novoLOG) injection 0-9 Units  0-9 Units Subcutaneous TID WC Maveric Debono, Lowry Ram, FNP   5 Units at 08/09/18 0813  . lamoTRIgine (LAMICTAL) tablet 100 mg  100 mg Oral QHS Clapacs, Madie Reno, MD   100 mg at 08/08/18 2138  . levothyroxine (SYNTHROID) tablet 150 mcg  150 mcg Oral Q0600 Nyzaiah Kai, Lowry Ram, FNP   150 mcg at 08/09/18 0650  . loratadine (CLARITIN) tablet 10 mg  10 mg Oral Daily Clapacs, Madie Reno, MD   10 mg at 08/09/18 4132  . LORazepam (ATIVAN) tablet 2 mg  2 mg Oral Q4H PRN Clapacs, Madie Reno, MD   2 mg at 08/08/18 2138   Or  . LORazepam (ATIVAN) injection 2 mg  2 mg Intramuscular Q4H PRN Clapacs, John T, MD      . lurasidone (LATUDA) tablet 40 mg  40 mg Oral BID WC Clapacs, Madie Reno, MD   40 mg at 08/09/18 4401  . magnesium hydroxide (MILK OF MAGNESIA) suspension 30 mL  30 mL Oral Daily PRN Meshawn Oconnor, Lowry Ram, FNP      . moxifloxacin (VIGAMOX) 0.5 % ophthalmic solution 1 drop  1 drop Right Eye TID Shaarav Ripple, Lowry Ram, FNP   1 drop at 08/09/18 0272  . pantoprazole  (PROTONIX) EC tablet 40 mg  40 mg Oral Daily Jahon Bart, Lowry Ram, FNP   40 mg at 08/09/18 5366  . traZODone (DESYREL) tablet 100 mg  100 mg Oral QHS Minela Bridge[place], Lowry Ram, FNP   100 mg  at 08/08/18 2138  . ziprasidone (GEODON) injection 20 mg  20 mg Intramuscular Q12H PRN Clapacs, Madie Reno, MD   20 mg at 08/05/18 0800    Lab Results:  Results for orders placed or performed during the hospital encounter of 08/04/18 (from the past 48 hour(s))  Glucose, capillary     Status: Abnormal   Collection Time: 08/07/18 11:35 AM  Result Value Ref Range   Glucose-Capillary 199 (H) 70 - 99 mg/dL  Glucose, capillary     Status: Abnormal   Collection Time: 08/07/18  4:25 PM  Result Value Ref Range   Glucose-Capillary 280 (H) 70 - 99 mg/dL  Glucose, capillary     Status: Abnormal   Collection Time: 08/07/18  8:27 PM  Result Value Ref Range   Glucose-Capillary 292 (H) 70 - 99 mg/dL   Comment 1 Notify RN   Glucose, capillary     Status: Abnormal   Collection Time: 08/08/18  7:00 AM  Result Value Ref Range   Glucose-Capillary 217 (H) 70 - 99 mg/dL   Comment 1 Notify RN   Glucose, capillary     Status: Abnormal   Collection Time: 08/08/18 11:30 AM  Result Value Ref Range   Glucose-Capillary 206 (H) 70 - 99 mg/dL  Glucose, capillary     Status: Abnormal   Collection Time: 08/08/18  4:16 PM  Result Value Ref Range   Glucose-Capillary 308 (H) 70 - 99 mg/dL  Glucose, capillary     Status: Abnormal   Collection Time: 08/08/18  9:32 PM  Result Value Ref Range   Glucose-Capillary 258 (H) 70 - 99 mg/dL  Glucose, capillary     Status: Abnormal   Collection Time: 08/09/18  7:18 AM  Result Value Ref Range   Glucose-Capillary 264 (H) 70 - 99 mg/dL  Glucose, capillary     Status: Abnormal   Collection Time: 08/09/18 11:28 AM  Result Value Ref Range   Glucose-Capillary 386 (H) 70 - 99 mg/dL    Blood Alcohol level:  Lab Results  Component Value Date   ETH <10 07/19/2018   ETH <10 27/03/5007    Metabolic  Disorder Labs: Lab Results  Component Value Date   HGBA1C 5.3 06/05/2018   MPG 105.41 06/05/2018   MPG 102.54 12/30/2017   No results found for: PROLACTIN Lab Results  Component Value Date   CHOL 155 06/05/2018   TRIG 93 06/05/2018   HDL 54 06/05/2018   CHOLHDL 2.9 06/05/2018   VLDL 19 06/05/2018   LDLCALC 82 06/05/2018   [place] 90 12/30/2017    Physical Findings: AIMS:  , ,  ,  ,    CIWA:    COWS:     Musculoskeletal: Strength & Muscle Tone: within normal limits Gait & Station: normal Patient leans: N/A  Psychiatric Specialty Exam: Physical Exam  Nursing note and vitals reviewed. Constitutional: She is oriented to person, place, and time. She appears well-developed and well-nourished.  Cardiovascular: Normal rate.  Respiratory: Effort normal.  Musculoskeletal: Normal range of motion.  Neurological: She is alert and oriented to person, place, and time.    Review of Systems  Constitutional: Negative.   HENT: Negative.   Eyes: Negative.   Respiratory: Negative.   Cardiovascular: Negative.   Gastrointestinal: Negative.   Genitourinary: Negative.   Musculoskeletal: Negative.   Skin: Negative.   Neurological: Negative.   Endo/Heme/Allergies: Negative.   Psychiatric/Behavioral: Negative.     Blood pressure 130/80, pulse 85, temperature 97.8 F (36.6 C), temperature source  Oral, resp. rate 17, height 4\' 10"  (1.473 m), weight 72.6 kg, SpO2 95 %.Body mass index is 33.44 kg/m.  General Appearance: Casual and Fairly Groomed  Eye Contact:  Good  Speech:  Clear and Coherent and Normal Rate  Volume:  Normal  Mood:  Euthymic  Affect:  Congruent  Thought Process:  Coherent and Descriptions of Associations: Intact  Orientation:  Full (Time, Place, and Person)  Thought Content:  WDL  Suicidal Thoughts:  No  Homicidal Thoughts:  No  Memory:  Immediate;   Good Recent;   Good Remote;   Good  Judgement:  Fair  Insight:  Fair  Psychomotor Activity:  Normal   Concentration:  Concentration: Good and Attention Span: Good  Recall:  Good  Fund of Knowledge:  Good  Language:  Good  Akathisia:  No  Handed:  Right  AIMS (if indicated):     Assets:  Communication Skills Desire for Improvement Financial Resources/Insurance Resilience Transportation  ADL's:  Intact  Cognition:  WNL  Sleep:  Number of Hours: 7     Treatment Plan Summary: Daily contact with patient to assess and evaluate symptoms and progress in treatment and Medication management  Patient is presenting much more pleasant today and does not appear to be irritable and has been safe on the unit thus far.  Plan is to discontinue 1: 1 sitter to evaluate patient's behavior.  Patient does have a longstanding history of having erratic behavior and acting out when she does not get her way.  However we will continue patient's medications of Cymbalta 60 mg p.o. daily, Neurontin 600 mg p.o. 3 times daily, Lamictal 100 mg nightly, Latuda 40 mg p.o. twice daily with meals, trazodone 100 mg p.o. nightly out of the room continue improvement of patient's mood instability and continue her sleep schedule.  We will continue Synthroid 150 mcg p.o. daily for hypothyroidism.  We will continue Protonix 40 mg p.o. daily for GERD.  We will continue the Ativan p.o. or IM for patient's anxiety and for sedation.  Continue patient's atorvastatin 10 mg p.o. daily for hyperlipidemia.  Social work Biochemist, clinical are working on group home placement for the patient and patient is still on a wait list for [place] due to her previous aggressive behavior.  [place], FNP 08/09/2018, 11:34 AM

## 2018-08-09 NOTE — Progress Notes (Signed)
Inpatient Diabetes Program Recommendations  AACE/ADA: New Consensus Statement on Inpatient Glycemic Control (2015)  Target Ranges:  Prepandial:   less than 140 mg/dL      Peak postprandial:   less than 180 mg/dL (1-2 hours)      Critically ill patients:  140 - 180 mg/dL   Lab Results  Component Value Date   GLUCAP 264 (H) 08/09/2018   HGBA1C 5.3 06/05/2018    Review of Glycemic Control Results for HANA, TRIPPETT (MRN [id number]) as of 08/09/2018 11:20  Ref. Range 08/08/2018 11:30 08/08/2018 16:16 08/08/2018 21:32 08/09/2018 07:18  Glucose-Capillary Latest Ref Range: 70 - 99 mg/dL 206 (H) 308 (H) 258 (H) 264 (H)   Diabetes history: DM 2 Outpatient Diabetes medications:  Glucotrol XL 5 mg daily, Levemir 10 units bid (per medication reconciliation, patient was not taking) Metformin 500 mg bid Current orders for Inpatient glycemic control:  Novolog sensitive tid with meals and HS  Inpatient Diabetes Program Recommendations:    Please consider adding Levemir 10 units daily while in the hospital.  It appears that patient was on Levemir in the past.   Thanks  Adah Perl, RN, BC-ADM Inpatient Diabetes Coordinator Pager [phone] (8a-5p)

## 2018-08-09 NOTE — Progress Notes (Signed)
Recreation Therapy Notes  Date: 08/09/2018  Time: 9:30 am  Location: Craft room  Behavioral response: Appropriate   Intervention Topic: Anger Management   Discussion/Intervention:  Group content on today was focused on anger management. The group defined anger and reasons they become angry. Individuals expressed negative way they have dealt with anger in the past. Patients stated some positive ways they could deal with anger in the future. The group described how anger can affect your health and daily plans. Individuals participated in the intervention "Score your anger" where they had a chance to answer questions about themselves and get a score of their anger.  Clinical Observations/Feedback:  Patient came to group late and stated she gets angry because people push her buttons and pick on her. She identified coloring, drawing and journaling as ways she manages her anger.  Participant  explained that she knows she is angry when she cracks her knuckles and becomes tense. Individual was social with peers and staff while participating in the intervention.  Jessica [name] LRT/CTRS         Jessica [name] 08/09/2018 10:56 AM

## 2018-08-09 NOTE — NC FL2 (Signed)
[place] LEVEL OF CARE SCREENING TOOL     IDENTIFICATION  Patient Name: Jessica [name] Birthdate: 1965/09/08 Sex: female Admission Date (Current Location): 08/04/2018  [place] and [place] Number:  Engineering geologist and Address:  [facility], [address] [place], [place] [address]      Provider Number: [id number]  Attending Physician Name and Address:  Gonzella Lex, MD  Relative Name and Phone Number:  Pilar Plate   [phone]    Current Level of Care: Hospital Recommended Level of Care: Other (Comment), Family Care Home(Group home) Prior Approval Number:    Date Approved/Denied:   PASRR Number:    Discharge Plan: Other (Comment)(group home)    Current Diagnoses: Patient Active Problem List   Diagnosis Date Noted  . Bipolar disorder, unspecified ([place]) 08/04/2018  . GERD (gastroesophageal reflux disease) 07/31/2018  . Hypothyroidism 07/31/2018  . Hypoxia 07/31/2018  . Homicidal ideation 06/15/2018  . Schizoaffective disorder, bipolar type ([place]) 06/15/2018  . Homicidal thoughts 06/15/2018  . Borderline personality disorder ([place]) 06/05/2018  . Agitation 02/21/2018  . Intellectual disability 12/27/2017  . Diabetes mellitus without complication ([place]) 09/01/4816  . Atypical glandular cells of undetermined significance (AGUS) on cervical Pap smear 06/09/2016  . HTN (hypertension) 07/11/2013    Orientation RESPIRATION BLADDER Height & Weight     Self, Time, Situation, Place  Normal Continent Weight: 160 lb (72.6 kg) Height:  4\' 10"  (147.3 cm)  BEHAVIORAL SYMPTOMS/MOOD NEUROLOGICAL BOWEL NUTRITION STATUS  Verbally abusive, Physically abusive, Dangerous to self, others or property (none reported) Production assistant, radio)  AMBULATORY STATUS COMMUNICATION OF NEEDS Skin   Independent Verbally Normal                       Personal Care Assistance Level of Assistance  (none reported) Bathing Assistance:  Independent Feeding assistance: Independent Dressing Assistance: Independent     Functional Limitations Info  (none reported) Sight Info: Adequate Hearing Info: Adequate Speech Info: Adequate    SPECIAL CARE FACTORS FREQUENCY  (hypertension and diabetes)                    Contractures Contractures Info: Not present    Additional Factors Info  Code Status Code Status Info: Full             Current Medications (08/09/2018):  This is the current hospital active medication list Current Facility-Administered Medications  Medication Dose Route Frequency Provider Last Rate Last Dose  . acetaminophen (TYLENOL) tablet 650 mg  650 mg Oral Q6H PRN Money, Lowry Ram, FNP      . alum & mag hydroxide-simeth (MAALOX/MYLANTA) 200-200-20 MG/5ML suspension 30 mL  30 mL Oral Q4H PRN Money, Lowry Ram, FNP      . atorvastatin (LIPITOR) tablet 10 mg  10 mg Oral q1800 Money, Lowry Ram, FNP   10 mg at 08/08/18 1814  . DULoxetine (CYMBALTA) DR capsule 60 mg  60 mg Oral Daily Clapacs, Madie Reno, MD   60 mg at 08/09/18 5631  . ferrous sulfate tablet 325 mg  325 mg Oral BID WC Money, Lowry Ram, FNP   325 mg at 08/09/18 4970  . gabapentin (NEURONTIN) capsule 600 mg  600 mg Oral TID Clapacs, Madie Reno, MD   600 mg at 08/09/18 1151  . insulin aspart (novoLOG) injection 0-5 Units  0-5 Units Subcutaneous QHS Money, Lowry Ram, FNP   3 Units at 08/08/18 2137  . insulin aspart (  novoLOG) injection 0-9 Units  0-9 Units Subcutaneous TID WC Money, Lowry Ram, FNP   9 Units at 08/09/18 1150  . lamoTRIgine (LAMICTAL) tablet 100 mg  100 mg Oral QHS Clapacs, Madie Reno, MD   100 mg at 08/08/18 2138  . levothyroxine (SYNTHROID) tablet 150 mcg  150 mcg Oral Q0600 Money, Lowry Ram, FNP   150 mcg at 08/09/18 0650  . loratadine (CLARITIN) tablet 10 mg  10 mg Oral Daily Clapacs, Madie Reno, MD   10 mg at 08/09/18 1975  . LORazepam (ATIVAN) tablet 2 mg  2 mg Oral Q4H PRN Clapacs, Madie Reno, MD   2 mg at 08/08/18 2138   Or  . LORazepam (ATIVAN)  injection 2 mg  2 mg Intramuscular Q4H PRN Clapacs, John T, MD      . lurasidone (LATUDA) tablet 40 mg  40 mg Oral BID WC Clapacs, Madie Reno, MD   40 mg at 08/09/18 8832  . magnesium hydroxide (MILK OF MAGNESIA) suspension 30 mL  30 mL Oral Daily PRN Money, Lowry Ram, FNP      . moxifloxacin (VIGAMOX) 0.5 % ophthalmic solution 1 drop  1 drop Right Eye TID Money, Lowry Ram, FNP   1 drop at 08/09/18 1151  . pantoprazole (PROTONIX) EC tablet 40 mg  40 mg Oral Daily Money, Lowry Ram, FNP   40 mg at 08/09/18 5498  . traZODone (DESYREL) tablet 100 mg  100 mg Oral QHS Money, Travis B, FNP   100 mg at 08/08/18 2138  . ziprasidone (GEODON) injection 20 mg  20 mg Intramuscular Q12H PRN Clapacs, Madie Reno, MD   20 mg at 08/05/18 0800     Discharge Medications: Please see discharge summary for a list of discharge medications.  Relevant Imaging Results:  Relevant Lab Results:   Additional [place], LCSW

## 2018-08-09 NOTE — Progress Notes (Signed)
Patient alert and oriented x 4, affect is brighter, she appears more receptive to nursing staff, she appears demanding and attention seeking, continues to be on 1:1 and she stated " I feel better with a sitter" patient thoughts are disorganized at times, bur she easily reoriented and she follows simple command. 15 minutes safety checks maintained will continue to monitor.

## 2018-08-09 NOTE — BHH Counselor (Signed)
CSW contacted [facility] and left vm for A[place]an Domingo [place](intake coord [phone] ext [phone]) to inquire about services and if referral had been submitted by medical staff while the pt was in the ED.

## 2018-08-09 NOTE — Progress Notes (Signed)
Pt is alert and oriented x 4. She is active and present on unit. She has not needed any redirection today. She has been compliant with medications and is aggreable to treatment plan. Pt is calm and cooperative. She is bright with interactions. She denies SI, HI, AVH. Staff has offered encouragement and support. No distress is noted and no needs were expressed. Will continue to monitor.

## 2018-08-10 LAB — GLUCOSE, CAPILLARY
Glucose-Capillary: 310 mg/dL — ABNORMAL HIGH (ref 70–99)
Glucose-Capillary: 127 mg/dL — ABNORMAL HIGH (ref 70–99)
Glucose-Capillary: 155 mg/dL — ABNORMAL HIGH (ref 70–99)
Glucose-Capillary: 161 mg/dL — ABNORMAL HIGH (ref 70–99)

## 2018-08-10 MED ORDER — METFORMIN HCL 500 MG PO TABS
500.0000 mg | ORAL_TABLET | Freq: Every day | ORAL | Status: DC
  Administered 2018-08-11: 500 mg via ORAL
  Filled 2018-08-10: qty 1

## 2018-08-10 NOTE — Progress Notes (Signed)
[facility] MD Progress Note  08/10/2018 1:24 PM Jessica [name]  MRN:  [id number] Subjective: Patient is a 53 year old female with a past psychiatric history significant for schizoaffective disorder/bipolar disorder who was admitted on 08/05/2018 after transfer from the medical service where she was hospitalized there for spell of hypoxia.  It was noted the patient had chronic irritability and with anger and violent outburst at times.  She was admitted to the hospital for evaluation and stabilization.  Objective: Patient is seen and examined.  Patient is a 53 year old female with the above-stated past psychiatric history who is seen in follow-up.  She is doing fine.  Apparently the big issue right now has to do with placement.  She denied any auditory or visual hallucinations.  She denied any side effects to her medications.  She also has a history of intellectual disability, personality disorder.  Her current medications include Lipitor, Cymbalta, iron sulfate, gabapentin, sliding scale insulin, Lamictal, Synthroid, Claritin, Latuda, moxifloxacin eyedrops, Protonix, trazodone.  Her vital signs are stable, she is afebrile.  She slept 5.75 hours last night.  Her blood sugar this morning was good at 127.  Review of her laboratories revealed some mild abnormalities from labs drawn on 7/13.  She had a mild anemia on 7/15 with a hemoglobin 11.3 and hematocrit of 34.3.  Platelets were very low at 47,000.  Her Depakote level on 7/11 was 39.  Her TSH is little low at 0.355.  Her HIV was negative.  Her last outpatient visit with primary care was in 03/10/2018.  She had a diagnosis of type 2 diabetes, vitamin D deficiency, hypothyroidism, iron deficiency anemia, hyperlipidemia, essential hypertension and GERD.  Review of her platelet count over the last 10 days showed her count going between 55,040 7000.  Her discharge summary from the medicine service on 08/04/2018 noted chronic thrombocytopenia with no bleeding.  Hematology had  been consulted.  They noted that her typical platelet count was approximately in the 70,000's.  It was felt secondary to hepatitis C and splenomegaly.  They recommended outpatient follow-up after discharge from psychiatry.  Principal Problem: Schizoaffective disorder, bipolar type ([place]) Diagnosis: Principal Problem:   Schizoaffective disorder, bipolar type ([place]) Active Problems:   Diabetes mellitus without complication ([place])   Intellectual disability   Agitation   Borderline personality disorder ([facility])   HTN (hypertension)   Hypothyroidism   Bipolar disorder, unspecified ([place])  Total Time spent with patient: 30 minutes  Past Psychiatric History: See admission H&P  Past Medical History:  Past Medical History:  Diagnosis Date  . Anxiety   . Depression   . Diabetes mellitus without complication ([place])   . GERD (gastroesophageal reflux disease)   . Homicidal ideation   . Hypertension   . Hypothyroidism     Past Surgical History:  Procedure Laterality Date  . COLPOSCOPY    . FRACTURE SURGERY    . TRACHEOSTOMY     Family History: History reviewed. No pertinent family history. Family Psychiatric  History: See admission H&P Social History:  Social History   Substance and Sexual Activity  Alcohol Use No  . Frequency: Never     Social History   Substance and Sexual Activity  Drug Use Yes  . Types: Marijuana, "Crack" cocaine   Comment: reports she has not done anything in a long time    Social History   Socioeconomic History  . Marital status: Single    Spouse name: Not on file  . Number of children: Not on file  .  Years of education: Not on file  . Highest education level: Not on file  Occupational History  . Not on file  Social Needs  . Financial resource strain: Somewhat hard  . Food insecurity    Worry: Patient refused    Inability: Patient refused  . Transportation needs    Medical: Patient refused    Non-medical: Patient refused  Tobacco Use  . Smoking  status: Former Research scientist (life sciences)  . Smokeless tobacco: Never Used  Substance and Sexual Activity  . Alcohol use: No    Frequency: Never  . Drug use: Yes    Types: Marijuana, "Crack" cocaine    Comment: reports she has not done anything in a long time  . Sexual activity: Not Currently    Birth control/protection: Abstinence  Lifestyle  . Physical activity    Days per week: Patient refused    Minutes per session: Patient refused  . Stress: Rather much  Relationships  . Social Herbalist on phone: Patient refused    Gets together: Patient refused    Attends religious service: Patient refused    Active member of club or organization: Patient refused    Attends meetings of clubs or organizations: Patient refused    Relationship status: Patient refused  Other Topics Concern  . Not on file  Social History Narrative  . Not on file   Additional Social History:                         Sleep: Fair  Appetite:  Good  Current Medications: Current Facility-Administered Medications  Medication Dose Route Frequency Provider Last Rate Last Dose  . acetaminophen (TYLENOL) tablet 650 mg  650 mg Oral Q6H PRN Money, Lowry Ram, FNP      . alum & mag hydroxide-simeth (MAALOX/MYLANTA) 200-200-20 MG/5ML suspension 30 mL  30 mL Oral Q4H PRN Money, Lowry Ram, FNP      . atorvastatin (LIPITOR) tablet 10 mg  10 mg Oral q1800 Money, Lowry Ram, FNP   10 mg at 08/09/18 1715  . DULoxetine (CYMBALTA) DR capsule 60 mg  60 mg Oral Daily Clapacs, Madie Reno, MD   60 mg at 08/10/18 0748  . ferrous sulfate tablet 325 mg  325 mg Oral BID WC Money, Lowry Ram, FNP   325 mg at 08/10/18 0748  . gabapentin (NEURONTIN) capsule 600 mg  600 mg Oral TID Clapacs, Madie Reno, MD   600 mg at 08/10/18 1136  . insulin aspart (novoLOG) injection 0-5 Units  0-5 Units Subcutaneous QHS Money, Lowry Ram, FNP   2 Units at 08/09/18 2142  . insulin aspart (novoLOG) injection 0-9 Units  0-9 Units Subcutaneous TID WC Money, Lowry Ram, FNP    1 Units at 08/10/18 1129  . lamoTRIgine (LAMICTAL) tablet 100 mg  100 mg Oral QHS Clapacs, John T, MD   100 mg at 08/09/18 2142  . levothyroxine (SYNTHROID) tablet 150 mcg  150 mcg Oral Q0600 Money, Lowry Ram, FNP   150 mcg at 08/10/18 8315  . loratadine (CLARITIN) tablet 10 mg  10 mg Oral Daily Clapacs, Madie Reno, MD   10 mg at 08/10/18 0747  . LORazepam (ATIVAN) tablet 2 mg  2 mg Oral Q4H PRN Clapacs, Madie Reno, MD   2 mg at 08/08/18 2138   Or  . LORazepam (ATIVAN) injection 2 mg  2 mg Intramuscular Q4H PRN Clapacs, Madie Reno, MD      . lurasidone (LATUDA) tablet  40 mg  40 mg Oral BID WC Clapacs, Madie Reno, MD   40 mg at 08/10/18 0748  . magnesium hydroxide (MILK OF MAGNESIA) suspension 30 mL  30 mL Oral Daily PRN Money, Lowry Ram, FNP      . moxifloxacin (VIGAMOX) 0.5 % ophthalmic solution 1 drop  1 drop Right Eye TID Money, Lowry Ram, FNP   1 drop at 08/10/18 1129  . pantoprazole (PROTONIX) EC tablet 40 mg  40 mg Oral Daily Money, Lowry Ram, FNP   40 mg at 08/10/18 0747  . traZODone (DESYREL) tablet 100 mg  100 mg Oral QHS Money, Travis B, FNP   100 mg at 08/09/18 2142  . ziprasidone (GEODON) injection 20 mg  20 mg Intramuscular Q12H PRN Clapacs, Madie Reno, MD   20 mg at 08/05/18 0800    Lab Results:  Results for orders placed or performed during the hospital encounter of 08/04/18 (from the past 48 hour(s))  Glucose, capillary     Status: Abnormal   Collection Time: 08/08/18  4:16 PM  Result Value Ref Range   Glucose-Capillary 308 (H) 70 - 99 mg/dL  Glucose, capillary     Status: Abnormal   Collection Time: 08/08/18  9:32 PM  Result Value Ref Range   Glucose-Capillary 258 (H) 70 - 99 mg/dL  Glucose, capillary     Status: Abnormal   Collection Time: 08/09/18  7:18 AM  Result Value Ref Range   Glucose-Capillary 264 (H) 70 - 99 mg/dL  Glucose, capillary     Status: Abnormal   Collection Time: 08/09/18 11:28 AM  Result Value Ref Range   Glucose-Capillary 386 (H) 70 - 99 mg/dL  Glucose, capillary      Status: Abnormal   Collection Time: 08/09/18  4:16 PM  Result Value Ref Range   Glucose-Capillary 300 (H) 70 - 99 mg/dL  Glucose, capillary     Status: Abnormal   Collection Time: 08/09/18  8:56 PM  Result Value Ref Range   Glucose-Capillary 204 (H) 70 - 99 mg/dL  Glucose, capillary     Status: Abnormal   Collection Time: 08/10/18  6:55 AM  Result Value Ref Range   Glucose-Capillary 310 (H) 70 - 99 mg/dL   Comment 1 Notify RN   Glucose, capillary     Status: Abnormal   Collection Time: 08/10/18 11:26 AM  Result Value Ref Range   Glucose-Capillary 127 (H) 70 - 99 mg/dL    Blood Alcohol level:  Lab Results  Component Value Date   ETH <10 07/19/2018   ETH <10 42/59/5638    Metabolic Disorder Labs: Lab Results  Component Value Date   HGBA1C 5.3 06/05/2018   MPG 105.41 06/05/2018   MPG 102.54 12/30/2017   No results found for: PROLACTIN Lab Results  Component Value Date   CHOL 155 06/05/2018   TRIG 93 06/05/2018   HDL 54 06/05/2018   CHOLHDL 2.9 06/05/2018   VLDL 19 06/05/2018   LDLCALC 82 06/05/2018   [place] 90 12/30/2017    Physical Findings: AIMS:  , ,  ,  ,    CIWA:    COWS:     Musculoskeletal: Strength & Muscle Tone: within normal limits Gait & Station: normal Patient leans: N/A  Psychiatric Specialty Exam: Physical Exam  Nursing note and vitals reviewed. Constitutional: She appears well-developed and well-nourished.  HENT:  Head: Normocephalic and atraumatic.  Respiratory: Effort normal.  Neurological: She is alert.    ROS  Blood pressure (!) 133/57, pulse  87, temperature 98.3 F (36.8 C), temperature source Oral, resp. rate 16, height 4\' 10"  (1.473 m), weight 72.6 kg, SpO2 95 %.Body mass index is 33.44 kg/m.  General Appearance: Casual  Eye Contact:  Fair  Speech:  Normal Rate  Volume:  Normal  Mood:  Anxious  Affect:  Congruent  Thought Process:  Coherent and Descriptions of Associations: Intact  Orientation:  Full (Time, Place, and  Person)  Thought Content:  Logical  Suicidal Thoughts:  No  Homicidal Thoughts:  No  Memory:  Immediate;   Fair Recent;   Fair Remote;   Fair  Judgement:  Intact  Insight:  Fair  Psychomotor Activity:  Increased  Concentration:  Concentration: Fair and Attention Span: Fair  Recall:  [employer] of Knowledge:  Fair  Language:  Good  Akathisia:  Negative  Handed:  Right  AIMS (if indicated):     Assets:  Desire for Improvement Resilience  ADL's:  Intact  Cognition:  WNL  Sleep:  Number of Hours: 5.75     Treatment Plan Summary: Daily contact with patient to assess and evaluate symptoms and progress in treatment, Medication management and Plan : Patient is seen and examined.  Patient is a 53 year old female with the above-stated past psychiatric history who is seen in follow-up.   Diagnosis: #1 schizoaffective disorder; bipolar type, #2 intellectual disability, #3 diabetes mellitus type 2, #4 thrombocytopenia, #5 history of hepatitis C, #6 hypertension, #7 hyperlipidemia, #8 peripheral neuropathy, #9 hypothyroidism, #10 GERD  Patient is a 53 year old female with the above-stated past medical and psychiatric history seen in follow-up.  From a psychiatric perspective she is stable enough for discharge.  Placement continues to be an issue.  Social work is working on that.  Her vital signs are stable, she is afebrile.  Her platelets are low, but stable.  We will repeat a platelet count before she leaves.  Cymbalta had been stopped on the medical floor, but that was restarted on psychiatry.  Unfortunately it appears as though she is just been on a sliding scale during the course the hospitalization for her diabetes.  In the discharge summary from 7/15 she was taking metformin 500 mg p.o. twice daily as well as Levemir insulin 10 units subcu twice daily.  Somehow or another that was not continued.  I am going to restart her metformin at 500 mg p.o. daily.  We will hold off on the Levemir at least  at this point.  Her blood sugar stable at this point, and may be stable just on the 500 mg alone. 1.  Continue Lipitor 10 mg p.o. daily for hyperlipidemia. 2.  Continue Cymbalta 60 mg p.o. daily for anxiety and depression. 3.  Continue ferrous sulfate 325 mg p.o. twice daily with food for anemia. 4.  Continue Neurontin 600 mg p.o. 3 times daily for neuropathy, anxiety and mood stability. 5.  Continue sliding scale insulin for now. 6.  Re-add metformin 500 mg p.o. daily for diabetes mellitus type 2. 7.  Continue Lamictal 100 mg p.o. nightly for mood stability. 8.  Continue levothyroxine 150 mcg p.o. daily for hypothyroidism. 9.  Continue the Claritin 10 mg p.o. daily for seasonal allergies. 10.  Continue Latuda 40 mg p.o. twice daily for psychosis and psychotic illness. 11 continue moxifloxacin ophthalmic drops for ocular issues. 12.  Continue Protonix 40 mg p.o. daily for GERD. 13.  Continue trazodone 100 mg p.o. nightly for sleep. 14.  Recheck platelets, recheck TSH. 15.  Disposition planning-in progress.  Sharma Covert, MD 08/10/2018, 1:24 PM

## 2018-08-10 NOTE — Progress Notes (Signed)
CSW received a call from Vilinda Boehringer, pts legal guardian who reported that she received pts paper work and has reached out to the following people of group homes: [facility], [place], [facility], [place], and [facility]. Tedra Coupe reported [facility] has a bed open and she is hoping to get pt into her home. Tedra Coupe stated she is going to reach out to [facility] to see if she had any openings. CSW informed Tedra Coupe that a [place] referral has been sent as well as trying to get in contact with [facility]. CSW asked Tedra Coupe to re-fax over the guardianship paperwork as it was not legible. Tedra Coupe reported she would fax it over again.   Evalina Field, MSW, LCSW Clinical Social Work 08/10/2018 11:36 AM

## 2018-08-10 NOTE — [facility] Counselor (Signed)
CSW called Aftan Winters at [facility]) and left vm, awaiting call back.

## 2018-08-10 NOTE — Plan of Care (Signed)
Pt denies depression, anxiety, SI, HI and AVH. Pt was educated on care plan and verbalizes understanding. Pt has a goal to "talk to my doctor and social worker".  Collier Bullock RN Problem: Education: Goal: Knowledge of [facility] Education information/materials will improve Outcome: Progressing   Problem: Coping: Goal: Ability to verbalize frustrations and anger appropriately will improve Outcome: Progressing Goal: Ability to demonstrate self-control will improve Outcome: Progressing   Problem: Safety: Goal: Periods of time without injury will increase Outcome: Progressing   Problem: Safety: Goal: Ability to redirect hostility and anger into socially appropriate behaviors will improve Outcome: Progressing   Problem: Activity: Goal: Interest or engagement in leisure activities will improve Outcome: Progressing Goal: Imbalance in normal sleep/wake cycle will improve Outcome: Progressing   Problem: Activity: Goal: Imbalance in normal sleep/wake cycle will improve Outcome: Progressing

## 2018-08-10 NOTE — Tx Team (Signed)
Interdisciplinary Treatment and Diagnostic Plan Update  08/10/2018 Time of Session: 8:30am Jessica [name] MRN: [id number]  Principal Diagnosis: Schizoaffective disorder, bipolar type ([place])  Secondary Diagnoses: Principal Problem:   Schizoaffective disorder, bipolar type ([place]) Active Problems:   Diabetes mellitus without complication ([place])   Intellectual disability   Agitation   Borderline personality disorder ([place])   HTN (hypertension)   Hypothyroidism   Bipolar disorder, unspecified ([place])   Current Medications:  Current Facility-Administered Medications  Medication Dose Route Frequency Provider Last Rate Last Dose  . acetaminophen (TYLENOL) tablet 650 mg  650 mg Oral Q6H PRN Money, Lowry Ram, FNP      . alum & mag hydroxide-simeth (MAALOX/MYLANTA) 200-200-20 MG/5ML suspension 30 mL  30 mL Oral Q4H PRN Money, Lowry Ram, FNP      . atorvastatin (LIPITOR) tablet 10 mg  10 mg Oral q1800 Money, Lowry Ram, FNP   10 mg at 08/09/18 1715  . DULoxetine (CYMBALTA) DR capsule 60 mg  60 mg Oral Daily Clapacs, Madie Reno, MD   60 mg at 08/10/18 0748  . ferrous sulfate tablet 325 mg  325 mg Oral BID WC Money, Lowry Ram, FNP   325 mg at 08/10/18 0748  . gabapentin (NEURONTIN) capsule 600 mg  600 mg Oral TID Clapacs, Madie Reno, MD   600 mg at 08/10/18 1136  . insulin aspart (novoLOG) injection 0-5 Units  0-5 Units Subcutaneous QHS Money, Lowry Ram, FNP   2 Units at 08/09/18 2142  . insulin aspart (novoLOG) injection 0-9 Units  0-9 Units Subcutaneous TID WC Money, Lowry Ram, FNP   1 Units at 08/10/18 1129  . lamoTRIgine (LAMICTAL) tablet 100 mg  100 mg Oral QHS Clapacs, John T, MD   100 mg at 08/09/18 2142  . levothyroxine (SYNTHROID) tablet 150 mcg  150 mcg Oral Q0600 Money, Lowry Ram, FNP   150 mcg at 08/10/18 0160  . loratadine (CLARITIN) tablet 10 mg  10 mg Oral Daily Clapacs, Madie Reno, MD   10 mg at 08/10/18 0747  . LORazepam (ATIVAN) tablet 2 mg  2 mg Oral Q4H PRN Clapacs, Madie Reno, MD   2 mg at 08/08/18 2138    Or  . LORazepam (ATIVAN) injection 2 mg  2 mg Intramuscular Q4H PRN Clapacs, John T, MD      . lurasidone (LATUDA) tablet 40 mg  40 mg Oral BID WC Clapacs, Madie Reno, MD   40 mg at 08/10/18 0748  . magnesium hydroxide (MILK OF MAGNESIA) suspension 30 mL  30 mL Oral Daily PRN Money, Lowry Ram, FNP      . [START ON 08/11/2018] metFORMIN (GLUCOPHAGE) tablet 500 mg  500 mg Oral Q breakfast Sharma Covert, MD      . moxifloxacin (VIGAMOX) 0.5 % ophthalmic solution 1 drop  1 drop Right Eye TID Money, Lowry Ram, FNP   1 drop at 08/10/18 1129  . pantoprazole (PROTONIX) EC tablet 40 mg  40 mg Oral Daily Money, Lowry Ram, FNP   40 mg at 08/10/18 0747  . traZODone (DESYREL) tablet 100 mg  100 mg Oral QHS Money, Travis B, FNP   100 mg at 08/09/18 2142  . ziprasidone (GEODON) injection 20 mg  20 mg Intramuscular Q12H PRN Clapacs, Madie Reno, MD   20 mg at 08/05/18 0800   PTA Medications: Medications Prior to Admission  Medication Sig Dispense Refill Last Dose  . atorvastatin (LIPITOR) 10 MG tablet Take 1 tablet (10 mg total) by mouth daily. [place]  tablet 1   . carvedilol (COREG) 6.25 MG tablet Take 1 tablet (6.25 mg total) by mouth 2 (two) times daily. 60 tablet 1   . divalproex (DEPAKOTE) 250 MG DR tablet Take 1 tablet (250 mg total) by mouth 2 (two) times daily. 60 tablet 0   . ferrous sulfate (FEROSUL) 325 (65 FE) MG tablet Take 1 tablet (325 mg total) by mouth 2 (two) times daily. 60 tablet 1   . gabapentin (NEURONTIN) 600 MG tablet Take 1 tablet (600 mg total) by mouth 3 (three) times daily. 90 tablet 1   . glipiZIDE (GLUCOTROL XL) 5 MG 24 hr tablet Take 5 mg by mouth daily.     . insulin detemir (LEVEMIR) 100 UNIT/ML injection Inject 0.1 mLs (10 Units total) into the skin 2 (two) times daily. (Patient not taking: Reported on 07/21/2018) 10 mL 11   . lamoTRIgine (LAMICTAL) 25 MG tablet Take 1 tablet (25 mg total) by mouth 2 (two) times daily. 60 tablet 0   . levothyroxine (SYNTHROID) 150 MCG tablet Take 1 tablet  (150 mcg total) by mouth daily at 6 (six) AM. 30 tablet 1   . lisinopril (ZESTRIL) 10 MG tablet Take 1 tablet (10 mg total) by mouth daily. 30 tablet 1   . loratadine (CLARITIN) 10 MG tablet Take 1 tablet (10 mg total) by mouth daily. 30 tablet 1   . metFORMIN (GLUCOPHAGE) 500 MG tablet Take 1 tablet (500 mg total) by mouth 2 (two) times daily. 60 tablet 1   . moxifloxacin (VIGAMOX) 0.5 % ophthalmic solution Place 1 drop into the right eye 3 (three) times daily. For 5 days 3 mL 0   . paliperidone (INVEGA) 3 MG 24 hr tablet Take 1 tablet (3 mg total) by mouth daily. 30 tablet 0   . pantoprazole (PROTONIX) 40 MG tablet Take 1 tablet (40 mg total) by mouth daily. 30 tablet 1     Patient Stressors: Health problems Legal issue Other: Artist  Patient Strengths: Active sense of humor Religious Affiliation  Treatment Modalities: Medication Management, Group therapy, Case management,  1 to 1 session with clinician, Psychoeducation, Recreational therapy.   Physician Treatment Plan for Primary Diagnosis: Schizoaffective disorder, bipolar type ([place]) Long Term Goal(s): Improvement in symptoms so as ready for discharge Improvement in symptoms so as ready for discharge   Short Term Goals: Ability to demonstrate self-control will improve Ability to identify and develop effective coping behaviors will improve Ability to maintain clinical measurements within normal limits will improve Compliance with prescribed medications will improve  Medication Management: Evaluate patient's response, side effects, and tolerance of medication regimen.  Therapeutic Interventions: 1 to 1 sessions, Unit Group sessions and Medication administration.  Evaluation of Outcomes: Not Progressing  Physician Treatment Plan for Secondary Diagnosis: Principal Problem:   Schizoaffective disorder, bipolar type ([place]) Active Problems:   Diabetes mellitus without complication ([place])   Intellectual disability    Agitation   Borderline personality disorder ([facility])   HTN (hypertension)   Hypothyroidism   Bipolar disorder, unspecified ([place])  Long Term Goal(s): Improvement in symptoms so as ready for discharge Improvement in symptoms so as ready for discharge   Short Term Goals: Ability to demonstrate self-control will improve Ability to identify and develop effective coping behaviors will improve Ability to maintain clinical measurements within normal limits will improve Compliance with prescribed medications will improve     Medication Management: Evaluate patient's response, side effects, and tolerance of medication regimen.  Therapeutic Interventions: 1 to 1  sessions, Unit Group sessions and Medication administration.  Evaluation of Outcomes: Not Progressing   RN Treatment Plan for Primary Diagnosis: Schizoaffective disorder, bipolar type ([place]) Long Term Goal(s): Knowledge of disease and therapeutic regimen to maintain health will improve  Short Term Goals: Ability to verbalize frustration and anger appropriately will improve, Ability to demonstrate self-control, Ability to participate in decision making will improve, Ability to verbalize feelings will improve and Ability to identify and develop effective coping behaviors will improve  Medication Management: RN will administer medications as ordered by provider, will assess and evaluate patient's response and provide education to patient for prescribed medication. RN will report any adverse and/or side effects to prescribing provider.  Therapeutic Interventions: 1 on 1 counseling sessions, Psychoeducation, Medication administration, Evaluate responses to treatment, Monitor vital signs and CBGs as ordered, Perform/monitor CIWA, COWS, AIMS and Fall Risk screenings as ordered, Perform wound care treatments as ordered.  Evaluation of Outcomes: Not Progressing   LCSW Treatment Plan for Primary Diagnosis: Schizoaffective disorder, bipolar type  ([place]) Long Term Goal(s): Safe transition to appropriate next level of care at discharge, Engage patient in therapeutic group addressing interpersonal concerns.  Short Term Goals: Engage patient in aftercare planning with referrals and resources, Increase social support, Increase ability to appropriately verbalize feelings, Increase emotional regulation and Facilitate acceptance of mental health diagnosis and concerns  Therapeutic Interventions: Assess for all discharge needs, 1 to 1 time with Social worker, Explore available resources and support systems, Assess for adequacy in community support network, Educate family and significant other(s) on suicide prevention, Complete Psychosocial Assessment, Interpersonal group therapy.  Evaluation of Outcomes: Not Progressing   Progress in Treatment: Attending groups: Yes. Participating in groups: Yes. Taking medication as prescribed: Yes. Toleration medication: Yes. Family/Significant other contact made: Yes, individual(s) contacted:  Pts legal guardian Fayne Norrie Patient understands diagnosis: Yes. Discussing patient identified problems/goals with staff: Yes. Medical problems stabilized or resolved: Yes. Denies suicidal/homicidal ideation: Yes. Issues/concerns per patient self-inventory: No. Other: none  New problem(s) identified: No, Describe:  none  New Short Term/Long Term Goal(s): medication management for mood stabilization; elimination of SI thoughts; development of comprehensive mental wellness plan.  Patient Goals:  "To find a new group home"  Discharge Plan or Barriers: At this time the patient is unable to return to her group home due to aggressive behaviors.  Patient will need support in identifying alternative placement.    Reason for Continuation of Hospitalization: Medication stabilization  Estimated Length of Stay:  TBD  Recreational Therapy: Patient: N/A Patient Goal: Patient will demonstrate no violent or  destructive behaviors during recreation therapy group sessions for duration of admission  Attendees: Patient: Jessica [name] 08/10/2018 3:35 PM  Physician:  08/10/2018 3:35 PM  Nursing:  08/10/2018 3:35 PM  RN Care Manager: 08/10/2018 3:35 PM  Social Worker: Minette Brine Moncerrath Berhe LCSW 08/10/2018 3:35 PM  Recreational Therapist:  08/10/2018 3:35 PM  Other:  08/10/2018 3:35 PM  Other:  08/10/2018 3:35 PM  Other: 08/10/2018 3:35 PM    Scribe for Treatment Team: Mariann Laster Aleane Wesenberg, LCSW 08/10/2018 3:35 PM

## 2018-08-10 NOTE — [facility] Counselor (Signed)
LCSW Group Therapy Note  08/10/2018 2:41 PM  Type of Therapy/Topic:  Group Therapy:  Emotion Regulation  Participation Level:  Active   Description of Group:   The purpose of this group is to assist patients in learning to regulate negative emotions and experience positive emotions. Patients will be guided to discuss ways in which they have been vulnerable to their negative emotions. These vulnerabilities will be juxtaposed with experiences of positive emotions or situations, and patients will be challenged to use positive emotions to combat negative ones. Special emphasis will be placed on coping with negative emotions in conflict situations, and patients will process healthy conflict resolution skills.  Therapeutic Goals: 1. Patient will identify two positive emotions or experiences to reflect on in order to balance out negative emotions 2. Patient will label two or more emotions that they find the most difficult to experience 3. Patient will demonstrate positive conflict resolution skills through discussion and/or role plays  Summary of Patient Progress: Patient was present for group. Patient was supportive of other group members.  Patient engaged in discussions and group activities.    Therapeutic Modalities:   Cognitive Behavioral Therapy Feelings Identification Dialectical Behavioral Therapy  Assunta Curtis, MSW, LCSW 08/10/2018 2:41 PM

## 2018-08-10 NOTE — Plan of Care (Signed)
Patient had no outbursts on the unit this evening. Patient is proud of how good she has been the last few days and is taking pride in getting herself better.   Problem: Education: Goal: Knowledge of [facility] Education information/materials will improve Outcome: Progressing   Problem: Coping: Goal: Ability to verbalize frustrations and anger appropriately will improve Outcome: Progressing

## 2018-08-10 NOTE — Progress Notes (Signed)
D - Patient was in her room upon arrival to the unit. Patient was pleasant during assessment and medication administration. Patient denies SI/HI/AVH, pain, anxiety and depression with this Probation officer. Patient was calm, cooperative on the unit with no outbursts this evening.   A - Patient compliant with medication administration per MD orders and procedures on the unit. Patient given education. Patient given support and encouragement to be active in her treatment plan. Patient informed to let staff know if there are any issues or problems on the unit. Patient observed interacting appropriately with staff and peers on the unit by this Probation officer.   R - Patient being monitored Q 15 minutes for safety per unit protocol. Patient remains safe on the unit.

## 2018-08-10 NOTE — Plan of Care (Signed)
Patient compliant with procedures on the unit. Patient is much improved.   Problem: Coping: Goal: Ability to demonstrate self-control will improve Outcome: Progressing

## 2018-08-10 NOTE — Progress Notes (Signed)
Recreation Therapy Notes   Date: 08/10/2018  Time: 9:30 am  Location: Craft room  Behavioral response: Appropriate   Intervention Topic: Goals  Discussion/Intervention:  Group content on today was focused on goals. Patients described what goals are and how they define goals. Individuals expressed how they go about setting goals and reaching them. The group identified how important goals are and if they make short term goals to reach long term goals. Patients described how many goals they work on at a time and what affects them not reaching their goal. Individuals described how much time they put into planning and obtaining their goals. The group participated in the intervention "My Goal Board" and made personal goal boards to help them achieve their goal. Clinical Observations/Feedback:  Patient came to group and defined goals as learning about herself. She stated that she makes goals for herself by thinking positive. Participant identified her goals as getting into a new group home and working on her anger.  Individual was social with peers and staff while participating in the intervention.  Shaylinn Hladik LRT/CTRS         Jonet Mathies 08/10/2018 12:07 PM

## 2018-08-10 NOTE — Progress Notes (Addendum)
Pt was somewhat needy today. She also had to be redirected several times however she was cooperative and calm. She continually tells me "I don't want to go back to that group home".Collier Bullock RN

## 2018-08-11 LAB — GLUCOSE, CAPILLARY
Glucose-Capillary: 277 mg/dL — ABNORMAL HIGH (ref 70–99)
Glucose-Capillary: 168 mg/dL — ABNORMAL HIGH (ref 70–99)
Glucose-Capillary: 140 mg/dL — ABNORMAL HIGH (ref 70–99)

## 2018-08-11 MED ORDER — METFORMIN HCL 500 MG PO TABS
500.0000 mg | ORAL_TABLET | Freq: Two times a day (BID) | ORAL | Status: DC
  Administered 2018-08-11 – 2018-08-12 (×2): 500 mg via ORAL
  Filled 2018-08-11 (×2): qty 1

## 2018-08-11 MED ORDER — LURASIDONE HCL 40 MG PO TABS
60.0000 mg | ORAL_TABLET | Freq: Two times a day (BID) | ORAL | Status: DC
  Administered 2018-08-11 – 2018-08-13 (×4): 60 mg via ORAL
  Filled 2018-08-11 (×4): qty 2

## 2018-08-11 MED ORDER — OLANZAPINE 5 MG PO TBDP
5.0000 mg | ORAL_TABLET | ORAL | Status: AC
  Administered 2018-08-11: 5 mg via ORAL
  Filled 2018-08-11: qty 1

## 2018-08-11 NOTE — Progress Notes (Signed)
Patient had to be woken up to move rooms. Patient agitated and became verbally abusive towards patients, stating, "I will fucking kill everyone of them in here". "I am going to kill that black bitch tomorrow." Patient given education. Patient given support. Patient continued to be verbally abusive towards staff. Patient didn't respond to redirection or education given to her. Patient given PRN medications per MD orders (see MAR).

## 2018-08-11 NOTE — Progress Notes (Addendum)
Recreation Therapy Notes  Patient got upset with a peer during group. She returned to her room; threw a table outside of the room into the hall yelling she was upset and wanted to kill her peer. Patient need much redirection from staff. Staff removed table and stored it. Patient made her way out of her room and to the door of her peer room.She asked him to come out and fight her. Patient was redirected by staff several times to return to her room. She eventually returned to her room and threw more items and yelled. Much redirection need by staff.         Amogh Komatsu 08/11/2018 1:43 PM

## 2018-08-11 NOTE — Progress Notes (Addendum)
[facility] MD Progress Note  08/11/2018 10:57 AM Jessica [name]  MRN:  [id number] Subjective:  Patient is a 53 year old female with a past psychiatric history significant for schizoaffective disorder/bipolar disorder who was admitted on 08/05/2018 after transfer from the medical service where she was hospitalized there for spell of hypoxia.  It was noted the patient had chronic irritability and with anger and violent outburst at times.  She was admitted to the  hospital for evaluation and stabilization.  Objective: Patient is seen and examined.  Patient is a 53 year old female with the above-stated past psychiatric history who is seen in follow-up.  She became agitated last night.  Some of the other patients had been manic or agitated and led to her becoming agitated.  She required PRN's last night including Geodon.  She was sleeping heavily this morning.  She has awoken this a.m., and does not want to have her blood sugar checked or take her medications.  Nursing stated they were giving her some Ativan which she was willing to take.  Review of the nursing notes revealed she became upset with 1 of the other female patients "I will flecking kill everyone of them in here", "I am going to kill that black bitch tomorrow".  Unfortunately she also refused her vital signs.  She did finally agreed to have her blood sugar checked last night it was 161.   Principal Problem: Schizoaffective disorder, bipolar type ([place]) Diagnosis: Principal Problem:   Schizoaffective disorder, bipolar type ([place]) Active Problems:   Diabetes mellitus without complication ([place])   Intellectual disability   Agitation   Borderline personality disorder (HCC)   HTN (hypertension)   Hypothyroidism   Bipolar disorder, unspecified ([place])  Total Time spent with patient: 15 minutes  Past Psychiatric History: See admission H&P  Past Medical History:  Past Medical History:  Diagnosis Date  . Anxiety   . Depression   . Diabetes mellitus  without complication ([place])   . GERD (gastroesophageal reflux disease)   . Homicidal ideation   . Hypertension   . Hypothyroidism     Past Surgical History:  Procedure Laterality Date  . COLPOSCOPY    . FRACTURE SURGERY    . TRACHEOSTOMY     Family History: History reviewed. No pertinent family history. Family Psychiatric  History: See admission H&P Social History:  Social History   Substance and Sexual Activity  Alcohol Use No  . Frequency: Never     Social History   Substance and Sexual Activity  Drug Use Yes  . Types: Marijuana, "Crack" cocaine   Comment: reports she has not done anything in a long time    Social History   Socioeconomic History  . Marital status: Single    Spouse name: Not on file  . Number of children: Not on file  . Years of education: Not on file  . Highest education level: Not on file  Occupational History  . Not on file  Social Needs  . Financial resource strain: Somewhat hard  . Food insecurity    Worry: Patient refused    Inability: Patient refused  . Transportation needs    Medical: Patient refused    Non-medical: Patient refused  Tobacco Use  . Smoking status: Former Research scientist (life sciences)  . Smokeless tobacco: Never Used  Substance and Sexual Activity  . Alcohol use: No    Frequency: Never  . Drug use: Yes    Types: Marijuana, "Crack" cocaine    Comment: reports she has not done anything in  a long time  . Sexual activity: Not Currently    Birth control/protection: Abstinence  Lifestyle  . Physical activity    Days per week: Patient refused    Minutes per session: Patient refused  . Stress: Rather much  Relationships  . Social Herbalist on phone: Patient refused    Gets together: Patient refused    Attends religious service: Patient refused    Active member of club or organization: Patient refused    Attends meetings of clubs or organizations: Patient refused    Relationship status: Patient refused  Other Topics Concern  .  Not on file  Social History Narrative  . Not on file   Additional Social History:                         Sleep: Fair  Appetite:  Fair  Current Medications: Current Facility-Administered Medications  Medication Dose Route Frequency Provider Last Rate Last Dose  . acetaminophen (TYLENOL) tablet 650 mg  650 mg Oral Q6H PRN Money, Lowry Ram, FNP      . alum & mag hydroxide-simeth (MAALOX/MYLANTA) 200-200-20 MG/5ML suspension 30 mL  30 mL Oral Q4H PRN Money, Lowry Ram, FNP      . atorvastatin (LIPITOR) tablet 10 mg  10 mg Oral q1800 Money, Lowry Ram, FNP   10 mg at 08/10/18 1650  . DULoxetine (CYMBALTA) DR capsule 60 mg  60 mg Oral Daily Clapacs, Madie Reno, MD   60 mg at 08/10/18 0748  . ferrous sulfate tablet 325 mg  325 mg Oral BID WC Money, Lowry Ram, FNP   325 mg at 08/10/18 1649  . gabapentin (NEURONTIN) capsule 600 mg  600 mg Oral TID Clapacs, Madie Reno, MD   600 mg at 08/10/18 1649  . insulin aspart (novoLOG) injection 0-5 Units  0-5 Units Subcutaneous QHS Money, Lowry Ram, FNP   2 Units at 08/09/18 2142  . insulin aspart (novoLOG) injection 0-9 Units  0-9 Units Subcutaneous TID WC Money, Lowry Ram, FNP   2 Units at 08/10/18 1653  . lamoTRIgine (LAMICTAL) tablet 100 mg  100 mg Oral QHS Clapacs, John T, MD   100 mg at 08/10/18 2120  . levothyroxine (SYNTHROID) tablet 150 mcg  150 mcg Oral Q0600 Money, Lowry Ram, FNP   150 mcg at 08/11/18 4401  . loratadine (CLARITIN) tablet 10 mg  10 mg Oral Daily Clapacs, Madie Reno, MD   10 mg at 08/10/18 0747  . LORazepam (ATIVAN) tablet 2 mg  2 mg Oral Q4H PRN Clapacs, Madie Reno, MD   2 mg at 08/08/18 2138   Or  . LORazepam (ATIVAN) injection 2 mg  2 mg Intramuscular Q4H PRN Clapacs, John T, MD   2 mg at 08/11/18 1010  . lurasidone (LATUDA) tablet 40 mg  40 mg Oral BID WC Clapacs, Madie Reno, MD   40 mg at 08/10/18 1652  . magnesium hydroxide (MILK OF MAGNESIA) suspension 30 mL  30 mL Oral Daily PRN Money, Darnelle Maffucci B, FNP      . metFORMIN (GLUCOPHAGE) tablet 500  mg  500 mg Oral Q breakfast Mallie Darting, Cordie Grice, MD      . moxifloxacin (VIGAMOX) 0.5 % ophthalmic solution 1 drop  1 drop Right Eye TID Money, Lowry Ram, FNP   1 drop at 08/10/18 1652  . pantoprazole (PROTONIX) EC tablet 40 mg  40 mg Oral Daily Money, Lowry Ram, FNP   40 mg at 08/10/18  97  . traZODone (DESYREL) tablet 100 mg  100 mg Oral QHS Money, Travis B, FNP   100 mg at 08/10/18 2120  . ziprasidone (GEODON) injection 20 mg  20 mg Intramuscular Q12H PRN Clapacs, Madie Reno, MD   20 mg at 08/11/18 0153    Lab Results:  Results for orders placed or performed during the hospital encounter of 08/04/18 (from the past 48 hour(s))  Glucose, capillary     Status: Abnormal   Collection Time: 08/09/18 11:28 AM  Result Value Ref Range   Glucose-Capillary 386 (H) 70 - 99 mg/dL  Glucose, capillary     Status: Abnormal   Collection Time: 08/09/18  4:16 PM  Result Value Ref Range   Glucose-Capillary 300 (H) 70 - 99 mg/dL  Glucose, capillary     Status: Abnormal   Collection Time: 08/09/18  8:56 PM  Result Value Ref Range   Glucose-Capillary 204 (H) 70 - 99 mg/dL  Glucose, capillary     Status: Abnormal   Collection Time: 08/10/18  6:55 AM  Result Value Ref Range   Glucose-Capillary 310 (H) 70 - 99 mg/dL   Comment 1 Notify RN   Glucose, capillary     Status: Abnormal   Collection Time: 08/10/18 11:26 AM  Result Value Ref Range   Glucose-Capillary 127 (H) 70 - 99 mg/dL  Glucose, capillary     Status: Abnormal   Collection Time: 08/10/18  4:22 PM  Result Value Ref Range   Glucose-Capillary 155 (H) 70 - 99 mg/dL  Glucose, capillary     Status: Abnormal   Collection Time: 08/10/18  8:46 PM  Result Value Ref Range   Glucose-Capillary 161 (H) 70 - 99 mg/dL    Blood Alcohol level:  Lab Results  Component Value Date   ETH <10 07/19/2018   ETH <10 47/82/9562    Metabolic Disorder Labs: Lab Results  Component Value Date   HGBA1C 5.3 06/05/2018   MPG 105.41 06/05/2018   MPG 102.54 12/30/2017    No results found for: PROLACTIN Lab Results  Component Value Date   CHOL 155 06/05/2018   TRIG 93 06/05/2018   HDL 54 06/05/2018   CHOLHDL 2.9 06/05/2018   VLDL 19 06/05/2018   LDLCALC 82 06/05/2018   [place] 90 12/30/2017    Physical Findings: AIMS:  , ,  ,  ,    CIWA:    COWS:     Musculoskeletal: Strength & Muscle Tone: within normal limits Gait & Station: normal Patient leans: N/A  Psychiatric Specialty Exam: Physical Exam  Nursing note and vitals reviewed. Constitutional: She appears well-developed and well-nourished.  HENT:  Head: Normocephalic and atraumatic.  Respiratory: Effort normal.  Neurological: She is alert.    ROS  Blood pressure (!) 133/57, pulse 87, temperature 98.3 F (36.8 C), temperature source Oral, resp. rate 16, height 4\' 10"  (1.473 m), weight 72.6 kg, SpO2 95 %.Body mass index is 33.44 kg/m.  General Appearance: Casual  Eye Contact:  Good  Speech:  Pressured  Volume:  Increased  Mood:  Dysphoric and Irritable  Affect:  Labile  Thought Process:  Goal Directed and Descriptions of Associations: Circumstantial  Orientation:  Negative  Thought Content:  Rumination  Suicidal Thoughts:  No  Homicidal Thoughts:  No  Memory:  Immediate;   Fair Recent;   Fair Remote;   Fair  Judgement:  Impaired  Insight:  Lacking  Psychomotor Activity:  Increased  Concentration:  Concentration: Fair and Attention Span: Fair  Recall:  [place] of Knowledge:  Fair  Language:  Fair  Akathisia:  Negative  Handed:  Right  AIMS (if indicated):     Assets:  Desire for Improvement Resilience  ADL's:  Intact  Cognition:  Impaired,  Mild  Sleep:  Number of Hours: 5     Treatment Plan Summary: Daily contact with patient to assess and evaluate symptoms and progress in treatment, Medication management and Plan : : Patient is seen and examined.  Patient is a 53 year old female with the above-stated past psychiatric history who is seen in follow-up.     Diagnosis: #1 schizoaffective disorder; bipolar type, #2 intellectual disability, #3 diabetes mellitus type 2, #4 thrombocytopenia, #5 history of hepatitis C, #6 hypertension, #7 hyperlipidemia, #8 peripheral neuropathy, #9 hypothyroidism, #10 GERD  Patient is seen in follow-up.  Unfortunately there was disruption on the unit last night, and that led to her becoming agitated as well.  She has refused morning medications, and ended up having to get Geodon last night.  She continues on oral Latuda twice a day.  I will increase that dosage to 80 mg p.o. twice daily for psychosis and psychotic illness to try and mood stabilize her.  No other changes in her medications at this time.  Hopefully the lorazepam will slow her down enough for [place] to be able to talk with her in a rational manner.  1.  Continue Lipitor 10 mg p.o. daily for hyperlipidemia. 2.  Continue Cymbalta 60 mg p.o. daily for anxiety and depression. 3.  Continue ferrous sulfate 325 mg p.o. twice daily with food for anemia. 4.  Continue Neurontin 600 mg p.o. 3 times daily for neuropathy, anxiety and mood stability. 5.  Continue sliding scale insulin for now. 6.  Re-add metformin 500 mg p.o. daily for diabetes mellitus type 2. 7.  Continue Lamictal 100 mg p.o. nightly for mood stability. 8.  Continue levothyroxine 150 mcg p.o. daily for hypothyroidism. 9.  Continue the Claritin 10 mg p.o. daily for seasonal allergies. 10.  Increase Latuda to 60 mg p.o. twice daily for psychosis and psychotic illness. 11 continue moxifloxacin ophthalmic drops for ocular issues. 12.  Continue Protonix 40 mg p.o. daily for GERD. 13.  Continue trazodone 100 mg p.o. nightly for sleep. 14.  Recheck platelets, recheck TSH. 15.  Disposition planning-in progress. Sharma Covert, MD 08/11/2018, 10:57 AM

## 2018-08-11 NOTE — Plan of Care (Addendum)
Pt says "leave me alone". Pt is irritated, shouting and yelling. Pt was given a Prn. Pt was educated on care plan and her medication regimen. Collier Bullock RN   Problem: Education: Goal: Knowledge of [facility] Education information/materials will improve Outcome: Not Progressing   Problem: Coping: Goal: Ability to verbalize frustrations and anger appropriately will improve Outcome: Not Progressing Goal: Ability to demonstrate self-control will improve Outcome: Not Progressing   Problem: Safety: Goal: Periods of time without injury will increase Outcome: Not Progressing   Problem: Safety: Goal: Ability to redirect hostility and anger into socially appropriate behaviors will improve Outcome: Not Progressing   Problem: Activity: Goal: Interest or engagement in leisure activities will improve Outcome: Not Progressing Goal: Imbalance in normal sleep/wake cycle will improve Outcome: Not Progressing

## 2018-08-11 NOTE — Progress Notes (Signed)
Pt has been very disruptive to the unit. She has been yelling making threats to staff and a patient. She cusses everyone out. She has threatened saying "I'm going to burn this hospital down!" I will reassess and continue to monitor. Collier Bullock RN

## 2018-08-11 NOTE — Progress Notes (Signed)
Recreation Therapy Notes   Date: 08/11/2018  Time: 9:30 am   Location: Craft room   Behavioral response: N/A   Intervention Topic: Relaxation  Discussion/Intervention: Patient did not attend group.   Clinical Observations/Feedback:  Patient did not attend group.   Brian Zeitlin LRT/CTRS        Jasai Sorg 08/11/2018 10:21 AM

## 2018-08-11 NOTE — Progress Notes (Signed)
Patient came and apologized to staff for her actions last night. Patient said she was was angry when she woke up and had to change rooms because she didn't really understand why she was being forced to do so.

## 2018-08-11 NOTE — Progress Notes (Signed)
Pt came around the nurses's station and threw coffee at the door/window. When I questioned her about what was wrong she cussed me out. She had left group. Collier Bullock RN

## 2018-08-11 NOTE — Progress Notes (Signed)
Pt woke up agitated and cussing the staff out. She is refusing all of her meds (several times) and also refusing to have her blood sugar taken. A PRN injection was given. MD notified.  Collier Bullock RN

## 2018-08-11 NOTE — [facility] Group Notes (Signed)
Balance In Life 08/11/2018 1PM  Type of Therapy/Topic:  Group Therapy:  Balance in Life  Participation Level:  Active  Description of Group:   This group will address the concept of balance and how it feels and looks when one is unbalanced. Patients will be encouraged to process areas in their lives that are out of balance and identify reasons for remaining unbalanced. Facilitators will guide patients in utilizing problem-solving interventions to address and correct the stressor making their life unbalanced. Understanding and applying boundaries will be explored and addressed for obtaining and maintaining a balanced life. Patients will be encouraged to explore ways to assertively make their unbalanced needs known to significant others in their lives, using other group members and facilitator for support and feedback.  Therapeutic Goals: 1. Patient will identify two or more emotions or situations they have that consume much of in their lives. 2. Patient will identify signs/triggers that life has become out of balance:  3. Patient will identify two ways to set boundaries in order to achieve balance in their lives:  4. Patient will demonstrate ability to communicate their needs through discussion and/or role plays  Summary of Patient Progress: Actively and appropriately participated in group session until she got upset with a group member and left group early. Pt discussed with group how her anger has led to her receiving negative consequences such as incarceration and hospitalization.    Therapeutic Modalities:   Cognitive Behavioral Therapy Solution-Focused Therapy Assertiveness Training  Monte Bronder Lynelle Smoke, LCSW

## 2018-08-12 LAB — GLUCOSE, CAPILLARY
Glucose-Capillary: 220 mg/dL — ABNORMAL HIGH (ref 70–99)
Glucose-Capillary: 308 mg/dL — ABNORMAL HIGH (ref 70–99)
Glucose-Capillary: 187 mg/dL — ABNORMAL HIGH (ref 70–99)

## 2018-08-12 LAB — CBC WITH DIFFERENTIAL/PLATELET
Abs Immature Granulocytes: 0.04 K/uL (ref 0.00–0.07)
Basophils Absolute: 0 K/uL (ref 0.0–0.1)
Basophils Relative: 1 %
Eosinophils Absolute: 0 K/uL (ref 0.0–0.5)
Eosinophils Relative: 0 %
HCT: 33.3 % — ABNORMAL LOW (ref 36.0–46.0)
Hemoglobin: 10.9 g/dL — ABNORMAL LOW (ref 12.0–15.0)
Immature Granulocytes: 1 %
Lymphocytes Relative: 34 %
Lymphs Abs: 1.6 K/uL (ref 0.7–4.0)
MCH: 31.7 pg (ref 26.0–34.0)
MCHC: 32.7 g/dL (ref 30.0–36.0)
MCV: 96.8 fL (ref 80.0–100.0)
Monocytes Absolute: 0.6 K/uL (ref 0.1–1.0)
Monocytes Relative: 13 %
Neutro Abs: 2.5 K/uL (ref 1.7–7.7)
Neutrophils Relative %: 51 %
Platelets: 70 K/uL — ABNORMAL LOW (ref 150–400)
RBC: 3.44 MIL/uL — ABNORMAL LOW (ref 3.87–5.11)
RDW: 13.8 % (ref 11.5–15.5)
WBC: 4.8 K/uL (ref 4.0–10.5)
nRBC: 0 % (ref 0.0–0.2)

## 2018-08-12 LAB — HEPATIC FUNCTION PANEL
ALT: 42 U/L (ref 0–44)
AST: 40 U/L (ref 15–41)
Albumin: 3.7 g/dL (ref 3.5–5.0)
Alkaline Phosphatase: 74 U/L (ref 38–126)
Bilirubin, Direct: <0.1 mg/dL (ref 0.0–0.2)
Total Bilirubin: 0.5 mg/dL (ref 0.3–1.2)
Total Protein: 7.1 g/dL (ref 6.5–8.1)

## 2018-08-12 LAB — TSH: TSH: 0.981 u[IU]/mL (ref 0.350–4.500)

## 2018-08-12 MED ORDER — METFORMIN HCL 850 MG PO TABS
850.0000 mg | ORAL_TABLET | Freq: Two times a day (BID) | ORAL | Status: DC
  Administered 2018-08-12 – 2018-08-13 (×2): 850 mg via ORAL
  Filled 2018-08-12 (×2): qty 1

## 2018-08-12 NOTE — Progress Notes (Signed)
CSW spoke with Mayer Camel, care coordinator through [facility] to inquire about process for [facility] referral. Jolyn Lent reported that it is a very lengthy process and they require a lot of documentation and also only review applications about 4 times a year.  Evalina Field, MSW, LCSW Clinical Social Work 08/12/2018 12:37 PM

## 2018-08-12 NOTE — Progress Notes (Signed)
Education and encouragement is provided to promote emotional and mental stability, encourage patient to engage and participate in peer activities , information was understood by patient, medication is provided and accepted and no noticeable side effects noted . Patient denies any SI/HI/AVH , patient is monitored every 15 minutes and frequently for safety , no distress.Marland Kitchen

## 2018-08-12 NOTE — Progress Notes (Signed)
[facility] MD Progress Note  08/12/2018 1:04 PM Jessica [name]  MRN:  [id number] Subjective:  Patient is a 53 year old female with a past psychiatric history significant for schizoaffective disorder/bipolar disorder who was admitted on 08/05/2018 after transfer from the medical service where she was hospitalized there for spell of hypoxia. It was noted the patient had chronic irritability and with anger and violent outburst at times. She was admitted to the  hospital for evaluation and stabilization.  Objective: Patient is seen and examined.  Patient is a 53 year old female with the above-stated past psychiatric history who is seen in follow-up.  She again had some behavioral issues last night.  Review of the nursing notes noted that she was very disruptive to the unit.  She had been making threats to staff as well as another patient.  She was using profane language.  We discussed that this a.m.  Much of her acting out has to do with her intellectual deficits and behavioral issues versus purely psychiatric issues.  There was no evidence of any racing thoughts or pressured speech.  She denied any suicidal or homicidal ideation.  She denied any auditory or visual hallucinations.  She was able to speak to me in a rational manner this a.m.  Her Latuda was increased to 60 mg p.o. twice daily yesterday.  Her blood sugar this morning is 220.  Her vital signs are stable, she is afebrile.  She did sleep 6 hours last night.  Principal Problem: Schizoaffective disorder, bipolar type ([place]) Diagnosis: Principal Problem:   Schizoaffective disorder, bipolar type ([place]) Active Problems:   Diabetes mellitus without complication ([place])   Intellectual disability   Agitation   Borderline personality disorder (HCC)   HTN (hypertension)   Hypothyroidism   Bipolar disorder, unspecified ([place])  Total Time spent with patient: 20 minutes  Past Psychiatric History: See admission H&P  Past Medical History:  Past Medical  History:  Diagnosis Date  . Anxiety   . Depression   . Diabetes mellitus without complication ([place])   . GERD (gastroesophageal reflux disease)   . Homicidal ideation   . Hypertension   . Hypothyroidism     Past Surgical History:  Procedure Laterality Date  . COLPOSCOPY    . FRACTURE SURGERY    . TRACHEOSTOMY     Family History: History reviewed. No pertinent family history. Family Psychiatric  History: See admission H&P Social History:  Social History   Substance and Sexual Activity  Alcohol Use No  . Frequency: Never     Social History   Substance and Sexual Activity  Drug Use Yes  . Types: Marijuana, "Crack" cocaine   Comment: reports she has not done anything in a long time    Social History   Socioeconomic History  . Marital status: Single    Spouse name: Not on file  . Number of children: Not on file  . Years of education: Not on file  . Highest education level: Not on file  Occupational History  . Not on file  Social Needs  . Financial resource strain: Somewhat hard  . Food insecurity    Worry: Patient refused    Inability: Patient refused  . Transportation needs    Medical: Patient refused    Non-medical: Patient refused  Tobacco Use  . Smoking status: Former Research scientist (life sciences)  . Smokeless tobacco: Never Used  Substance and Sexual Activity  . Alcohol use: No    Frequency: Never  . Drug use: Yes    Types: Marijuana, "  Crack" cocaine    Comment: reports she has not done anything in a long time  . Sexual activity: Not Currently    Birth control/protection: Abstinence  Lifestyle  . Physical activity    Days per week: Patient refused    Minutes per session: Patient refused  . Stress: Rather much  Relationships  . Social Herbalist on phone: Patient refused    Gets together: Patient refused    Attends religious service: Patient refused    Active member of club or organization: Patient refused    Attends meetings of clubs or organizations: Patient  refused    Relationship status: Patient refused  Other Topics Concern  . Not on file  Social History Narrative  . Not on file   Additional Social History:                         Sleep: Good  Appetite:  Good  Current Medications: Current Facility-Administered Medications  Medication Dose Route Frequency Provider Last Rate Last Dose  . acetaminophen (TYLENOL) tablet 650 mg  650 mg Oral Q6H PRN Money, Lowry Ram, FNP      . alum & mag hydroxide-simeth (MAALOX/MYLANTA) 200-200-20 MG/5ML suspension 30 mL  30 mL Oral Q4H PRN Money, Lowry Ram, FNP      . atorvastatin (LIPITOR) tablet 10 mg  10 mg Oral q1800 Money, Lowry Ram, FNP   10 mg at 08/11/18 1650  . DULoxetine (CYMBALTA) DR capsule 60 mg  60 mg Oral Daily Clapacs, Madie Reno, MD   60 mg at 08/12/18 0739  . ferrous sulfate tablet 325 mg  325 mg Oral BID WC Money, Lowry Ram, FNP   325 mg at 08/12/18 0739  . gabapentin (NEURONTIN) capsule 600 mg  600 mg Oral TID Clapacs, Madie Reno, MD   600 mg at 08/12/18 1144  . insulin aspart (novoLOG) injection 0-5 Units  0-5 Units Subcutaneous QHS Money, Lowry Ram, FNP   2 Units at 08/09/18 2142  . insulin aspart (novoLOG) injection 0-9 Units  0-9 Units Subcutaneous TID WC Money, Lowry Ram, FNP   3 Units at 08/12/18 1143  . lamoTRIgine (LAMICTAL) tablet 100 mg  100 mg Oral QHS Clapacs, Madie Reno, MD   100 mg at 08/11/18 2104  . levothyroxine (SYNTHROID) tablet 150 mcg  150 mcg Oral Q0600 Money, Lowry Ram, FNP   150 mcg at 08/12/18 4431  . loratadine (CLARITIN) tablet 10 mg  10 mg Oral Daily Clapacs, Madie Reno, MD   10 mg at 08/12/18 0738  . LORazepam (ATIVAN) tablet 2 mg  2 mg Oral Q4H PRN Clapacs, Madie Reno, MD   2 mg at 08/08/18 2138   Or  . LORazepam (ATIVAN) injection 2 mg  2 mg Intramuscular Q4H PRN Clapacs, Madie Reno, MD   2 mg at 08/12/18 0736  . lurasidone (LATUDA) tablet 60 mg  60 mg Oral BID WC Sharma Covert, MD   60 mg at 08/12/18 0739  . magnesium hydroxide (MILK OF MAGNESIA) suspension 30 mL  30 mL  Oral Daily PRN Money, Darnelle Maffucci B, FNP      . metFORMIN (GLUCOPHAGE) tablet 500 mg  500 mg Oral BID WC Sharma Covert, MD   500 mg at 08/12/18 0739  . moxifloxacin (VIGAMOX) 0.5 % ophthalmic solution 1 drop  1 drop Right Eye TID Money, Lowry Ram, FNP   1 drop at 08/12/18 1148  . pantoprazole (PROTONIX) EC tablet 40  mg  40 mg Oral Daily Money, Lowry Ram, FNP   40 mg at 08/12/18 3710  . traZODone (DESYREL) tablet 100 mg  100 mg Oral QHS Money, Travis B, FNP   100 mg at 08/11/18 2104  . ziprasidone (GEODON) injection 20 mg  20 mg Intramuscular Q12H PRN Clapacs, Madie Reno, MD   20 mg at 08/12/18 0736    Lab Results:  Results for orders placed or performed during the hospital encounter of 08/04/18 (from the past 48 hour(s))  Glucose, capillary     Status: Abnormal   Collection Time: 08/10/18  4:22 PM  Result Value Ref Range   Glucose-Capillary 155 (H) 70 - 99 mg/dL  Glucose, capillary     Status: Abnormal   Collection Time: 08/10/18  8:46 PM  Result Value Ref Range   Glucose-Capillary 161 (H) 70 - 99 mg/dL  Glucose, capillary     Status: Abnormal   Collection Time: 08/11/18 11:42 AM  Result Value Ref Range   Glucose-Capillary 277 (H) 70 - 99 mg/dL  Glucose, capillary     Status: Abnormal   Collection Time: 08/11/18  4:43 PM  Result Value Ref Range   Glucose-Capillary 168 (H) 70 - 99 mg/dL  Glucose, capillary     Status: Abnormal   Collection Time: 08/11/18  9:08 PM  Result Value Ref Range   Glucose-Capillary 140 (H) 70 - 99 mg/dL  Hepatic function panel     Status: None   Collection Time: 08/12/18  8:53 AM  Result Value Ref Range   Total Protein 7.1 6.5 - 8.1 g/dL   Albumin 3.7 3.5 - 5.0 g/dL   AST 40 15 - 41 U/L   ALT 42 0 - 44 U/L   Alkaline Phosphatase 74 38 - 126 U/L   Total Bilirubin 0.5 0.3 - 1.2 mg/dL   Bilirubin, Direct <0.1 0.0 - 0.2 mg/dL   Indirect Bilirubin NOT CALCULATED 0.3 - 0.9 mg/dL    Comment: Performed at [facility], [address] [place], [place]  [address]  TSH     Status: None   Collection Time: 08/12/18  8:53 AM  Result Value Ref Range   TSH 0.981 0.350 - 4.500 uIU/mL    Comment: Performed by a 3rd Generation assay with a functional sensitivity of <=0.01 uIU/mL. Performed at [facility], [address] [place], [place] [address]   CBC with Differential/Platelet     Status: Abnormal   Collection Time: 08/12/18  8:53 AM  Result Value Ref Range   WBC 4.8 4.0 - 10.5 K/uL   RBC 3.44 (L) 3.87 - 5.11 MIL/uL   Hemoglobin 10.9 (L) 12.0 - 15.0 g/dL   HCT 33.3 (L) 36.0 - 46.0 %   MCV 96.8 80.0 - 100.0 fL   MCH 31.7 26.0 - 34.0 pg   MCHC 32.7 30.0 - 36.0 g/dL   RDW 13.8 11.5 - 15.5 %   Platelets 70 (L) 150 - 400 K/uL    Comment: Immature Platelet Fraction may be clinically indicated, consider ordering this additional test [id number]    nRBC 0.0 0.0 - 0.2 %   Neutrophils Relative % 51 %   Neutro Abs 2.5 1.7 - 7.7 K/uL   Lymphocytes Relative 34 %   Lymphs Abs 1.6 0.7 - 4.0 K/uL   Monocytes Relative 13 %   Monocytes Absolute 0.6 0.1 - 1.0 K/uL   Eosinophils Relative 0 %   Eosinophils Absolute 0.0 0.0 - 0.5 K/uL   Basophils Relative  1 %   Basophils Absolute 0.0 0.0 - 0.1 K/uL   Immature Granulocytes 1 %   Abs Immature Granulocytes 0.04 0.00 - 0.07 K/uL    Comment: Performed at [facility], [address] [place], [place] [address]  Glucose, capillary     Status: Abnormal   Collection Time: 08/12/18 11:21 AM  Result Value Ref Range   Glucose-Capillary 220 (H) 70 - 99 mg/dL   Comment 1 Notify RN     Blood Alcohol level:  Lab Results  Component Value Date   ETH <10 07/19/2018   ETH <10 75/10/2583    Metabolic Disorder Labs: Lab Results  Component Value Date   HGBA1C 5.3 06/05/2018   MPG 105.41 06/05/2018   MPG 102.54 12/30/2017   No results found for: PROLACTIN Lab Results  Component Value Date   CHOL 155 06/05/2018   TRIG 93 06/05/2018   HDL 54 06/05/2018   CHOLHDL 2.9 06/05/2018   VLDL 19  06/05/2018   LDLCALC 82 06/05/2018   [place] 90 12/30/2017    Physical Findings: AIMS:  , ,  ,  ,    CIWA:    COWS:     Musculoskeletal: Strength & Muscle Tone: within normal limits Gait & Station: normal Patient leans: N/A  Psychiatric Specialty Exam: Physical Exam  Nursing note and vitals reviewed. Constitutional: She appears well-developed and well-nourished.  HENT:  Head: Normocephalic and atraumatic.  Respiratory: Effort normal.  Neurological: She is alert.    ROS  Blood pressure 118/77, pulse 83, temperature 98.3 F (36.8 C), temperature source Oral, resp. rate 16, height 4\' 10"  (1.473 m), weight 72.6 kg, SpO2 95 %.Body mass index is 33.44 kg/m.  General Appearance: Casual  Eye Contact:  Fair  Speech:  Normal Rate  Volume:  Normal  Mood:  Anxious  Affect:  Congruent  Thought Process:  Coherent and Descriptions of Associations: Circumstantial  Orientation:  Full (Time, Place, and Person)  Thought Content:  Logical  Suicidal Thoughts:  No  Homicidal Thoughts:  No  Memory:  Immediate;   Fair Recent;   Fair Remote;   Fair  Judgement:  Impaired  Insight:  Lacking  Psychomotor Activity:  Normal  Concentration:  Concentration: Fair and Attention Span: Fair  Recall:  [employer] of Knowledge:  Fair  Language:  Fair  Akathisia:  Negative  Handed:  Right  AIMS (if indicated):     Assets:  Desire for Improvement Resilience  ADL's:  Intact  Cognition:  WNL  Sleep:  Number of Hours: 6     Treatment Plan Summary: Daily contact with patient to assess and evaluate symptoms and progress in treatment, Medication management and Plan : Patient is seen and examined.  Patient is a 53 year old female with the above-stated past psychiatric history who is seen in follow-up.  Diagnosis: #1 schizoaffective disorder; bipolar type, #2 intellectual disability, #3 diabetes mellitus type 2, #4 thrombocytopenia, #5 history of hepatitis C, #6 hypertension, #7 hyperlipidemia, #8  peripheral neuropathy, #9 hypothyroidism, #10 GERD  Patient is seen and examined.  Patient is a 53 year old female who is seen in follow-up.  She is much calm her today.  We had a long discussion about her behavior.  She is tolerating the increased dose of Latuda.  Her blood sugar still remains elevated.  Metformin was re-added yesterday, and I am going to increase that dosage to 500 mg p.o. twice daily.  No other changes in her medications.  Her platelets came back at 70,000  which is significantly improved.  Her TSH is up to 0.981.  Her liver function enzymes are normal. 1. Continue Lipitor 10 mg p.o. daily for hyperlipidemia. 2. Continue Cymbalta 60 mg p.o. daily for anxiety and depression. 3. Continue ferrous sulfate 325 mg p.o. twice daily with food for anemia. 4. Continue Neurontin 600 mg p.o. 3 times daily for neuropathy, anxiety and mood stability. 5. Continue sliding scale insulin for now. 6. Increase metformin to 500 mg p.o. twice daily for diabetes mellitus type 2. 7. Continue Lamictal 100 mg p.o. nightly for mood stability. 8. Continue levothyroxine 150 mcg p.o. daily for hypothyroidism. 9. Continue the Claritin 10 mg p.o. daily for seasonal allergies. 10. Continue Latuda 60 mg p.o. twice daily for psychosis and psychotic illness. 11 continue moxifloxacin ophthalmic drops for ocular issues. 12. Continue Protonix 40 mg p.o. daily for GERD. 13. Continue trazodone 100 mg p.o. nightly for sleep. 14. Disposition planning-in progress. Sharma Covert, MD 08/12/2018, 1:04 PM

## 2018-08-12 NOTE — BHH Counselor (Signed)
CSW received VM from [employer]) who states the pt was declined admission due to them not having enough support to support the pt and the pt's criminal background. She states the pt has a trust fund but is unsure it will cover her living expenses. She further states residential IDD services need to be included. During progression meeting, Dr. Mallie Darting suggested referral be made to the [facility] for the pt. CSW called and left confidential vm for [facility](legal guardian) to discuss placement options.

## 2018-08-12 NOTE — Progress Notes (Signed)
Patient was asleep in her room with a safety sitter present. Patient was without complaint at this time.

## 2018-08-12 NOTE — Plan of Care (Signed)
Patient unable to process all  information  received. Staff gave information in concrete  formate .   Continue to  work on anger behavior and  frustration. Staff continue to guard  and monitor  behavior  around staff and peers . Voice no concerns  around wake or sleep cycle    Problem: Education: Goal: Knowledge of [place] [facility] Education information/materials will improve Outcome: Not Progressing   Problem: Coping: Goal: Ability to verbalize frustrations and anger appropriately will improve Outcome: Progressing Goal: Ability to demonstrate self-control will improve Outcome: Progressing   Problem: Safety: Goal: Periods of time without injury will increase Outcome: Progressing   Problem: Activity: Goal: Interest or engagement in leisure activities will improve Outcome: Not Progressing Goal: Imbalance in normal sleep/wake cycle will improve Outcome: Progressing

## 2018-08-12 NOTE — Progress Notes (Signed)
Patient remains 1:1 for safety with a sitter present. Patient has been asleep all night and is without complaint at this time. Patient didn't have any outbursts this evening. Patient was compliant with medication administration and procedures on the unit.

## 2018-08-12 NOTE — Plan of Care (Signed)
Patient had moments of agitation this evening. Patient was redirectable and was complaint with medications per MD orders.   Problem: Coping: Goal: Ability to demonstrate self-control will improve Outcome: Not Progressing

## 2018-08-12 NOTE — BHH Group Notes (Signed)
LCSW Group Therapy Note  08/12/2018 12:42 PM  Type of Therapy and Topic:  Group Therapy:  Feelings around Relapse and Recovery  Participation Level:  Did Not Attend   Description of Group:    Patients in this group will discuss emotions they experience before and after a relapse. They will process how experiencing these feelings, or avoidance of experiencing them, relates to having a relapse. Facilitator will guide patients to explore emotions they have related to recovery. Patients will be encouraged to process which emotions are more powerful. They will be guided to discuss the emotional reaction significant others in their lives may have to their relapse or recovery. Patients will be assisted in exploring ways to respond to the emotions of others without this contributing to a relapse.  Therapeutic Goals: 1. Patient will identify two or more emotions that lead to a relapse for them 2. Patient will identify two emotions that result when they relapse 3. Patient will identify two emotions related to recovery 4. Patient will demonstrate ability to communicate their needs through discussion and/or role plays   Summary of Patient Progress: x    Therapeutic Modalities:   Cognitive Behavioral Therapy Solution-Focused Therapy Assertiveness Training Relapse Prevention Therapy   Evalina Field, MSW, LCSW Clinical Social Work 08/12/2018 12:42 PM

## 2018-08-12 NOTE — Progress Notes (Signed)
D: Behavior/ A: Patient unable to process all  information  received. Staff gave information in concrete  formate .   Continue to  work on anger behavior and  frustration. Staff continue to guard  and monitor  behavior  around staff and peers . Voice no concerns  around wake or sleep cycle.  Patient  Remained  Calm  At present  R: continue to monitor

## 2018-08-12 NOTE — Progress Notes (Signed)
  D: Acting out behavior  A: Patient throwing  Books , at staff this am shift  Continue to use profanity " Nigger Bitch" to people of color. Patient slamming doors.  Patient continue to yell at staff . Continue to throw  What ever she could get her hands on . Patient continued to go up towards staff as though she would  Hit staff R:  Patient received  IM medication  To assist with  Acting  Out behavior .

## 2018-08-12 NOTE — Progress Notes (Signed)
Patient remains 1:1 for safety with a sitter present. Patient was compliant with medications per MD orders and is hopeful she will come off of the 1:1 tomorrow. Patient is without complaint at this time.

## 2018-08-12 NOTE — BHH Counselor (Signed)
CSW called to check on status of referral sent to [facility]. CSW spoke with Mary(screening and admissions [phone]) who reports they have not received a referral for the pt. CSW informed them that the referral was originally submitted on 7/16, resubmitted on 7/20 and having to be resubmitted for a third time on 7/24. Mary request Covid test, IQ test, guardianship. She states CSW needs to request an exception form from the [facility].   CSW spoke with Nina(screening and admissions) at 3pm and requested  that the pt be placed on the high priority list due to physical and verbal aggression displayed toward patients and staff on the unit. Gae Bon reports they have received the referral/fax that was resubmitted today.  CSW contacted Porfirio Mylar, legal guardian and relayed this information to her. She states she will contact the LME to find out about exception form. Derl Barrow reports she has contacted several [facility]/group homes that have denied the pt due to her aggressive behavior. Derl Barrow reports the pt will need a higher level of care and is in agreement with a referral being submitted to [facility]. Derl Barrow also states it is a tedious and lengthy process to make a [facility] referral and would like to prioritize getting her admitted at [facility].

## 2018-08-13 LAB — GLUCOSE, CAPILLARY
Glucose-Capillary: 283 mg/dL — ABNORMAL HIGH (ref 70–99)
Glucose-Capillary: 135 mg/dL — ABNORMAL HIGH (ref 70–99)
Glucose-Capillary: 160 mg/dL — ABNORMAL HIGH (ref 70–99)
Glucose-Capillary: 137 mg/dL — ABNORMAL HIGH (ref 70–99)

## 2018-08-13 MED ORDER — LURASIDONE HCL 80 MG PO TABS
80.0000 mg | ORAL_TABLET | Freq: Two times a day (BID) | ORAL | Status: DC
  Administered 2018-08-13 – 2018-09-19 (×72): 80 mg via ORAL
  Filled 2018-08-13 (×77): qty 1

## 2018-08-13 MED ORDER — METFORMIN HCL 500 MG PO TABS
1000.0000 mg | ORAL_TABLET | Freq: Two times a day (BID) | ORAL | Status: DC
  Administered 2018-08-13 – 2018-09-19 (×72): 1000 mg via ORAL
  Filled 2018-08-13 (×72): qty 2

## 2018-08-13 NOTE — Progress Notes (Signed)
[facility] MD Progress Note  08/13/2018 9:21 AM Jessica [name]  MRN:  [id number] Subjective:  Patient is a 53 year old female with a past psychiatric history significant for schizoaffective disorder/bipolar disorder who was admitted on 08/05/2018 after transfer from the medical service where she was hospitalized there for spell of hypoxia. It was noted the patient had chronic irritability and with anger and violent outburst at times. She was admitted to the hospital for evaluation and stabilization.  Objective: Patient is seen and examined.  Patient is a 53 year old female with the above-stated past psychiatric history who is seen in follow-up.  In most ways she is better today.  She had a good night last night.  She did not have any of the behavioral issues occur that led to problems the day before.  She was cooperative and her one-to-one observation status was stopped.  She slept well last night.  She did state that her auditory hallucinations were worse yesterday.  She stated that her voice sounded like "the devil" and that they were telling her to harm herself.  She stated this morning she is not hearing the voices but is hearing music.  She denied any suicidal ideation this morning.  Her vital signs are stable, she is afebrile.  She slept 6 hours last night.  She denied any side effects to her current medications.  Principal Problem: Schizoaffective disorder, bipolar type ([place]) Diagnosis: Principal Problem:   Schizoaffective disorder, bipolar type ([place]) Active Problems:   Diabetes mellitus without complication ([place])   Intellectual disability   Agitation   Borderline personality disorder (HCC)   HTN (hypertension)   Hypothyroidism   Bipolar disorder, unspecified ([place])  Total Time spent with patient: 20 minutes  Past Psychiatric History: See admission H&P  Past Medical History:  Past Medical History:  Diagnosis Date  . Anxiety   . Depression   . Diabetes mellitus without complication ([place])    . GERD (gastroesophageal reflux disease)   . Homicidal ideation   . Hypertension   . Hypothyroidism     Past Surgical History:  Procedure Laterality Date  . COLPOSCOPY    . FRACTURE SURGERY    . TRACHEOSTOMY     Family History: History reviewed. No pertinent family history. Family Psychiatric  History: See admission H&P Social History:  Social History   Substance and Sexual Activity  Alcohol Use No  . Frequency: Never     Social History   Substance and Sexual Activity  Drug Use Yes  . Types: Marijuana, "Crack" cocaine   Comment: reports she has not done anything in a long time    Social History   Socioeconomic History  . Marital status: Single    Spouse name: Not on file  . Number of children: Not on file  . Years of education: Not on file  . Highest education level: Not on file  Occupational History  . Not on file  Social Needs  . Financial resource strain: Somewhat hard  . Food insecurity    Worry: Patient refused    Inability: Patient refused  . Transportation needs    Medical: Patient refused    Non-medical: Patient refused  Tobacco Use  . Smoking status: Former Research scientist (life sciences)  . Smokeless tobacco: Never Used  Substance and Sexual Activity  . Alcohol use: No    Frequency: Never  . Drug use: Yes    Types: Marijuana, "Crack" cocaine    Comment: reports she has not done anything in a long time  . Sexual  activity: Not Currently    Birth control/protection: Abstinence  Lifestyle  . Physical activity    Days per week: Patient refused    Minutes per session: Patient refused  . Stress: Rather much  Relationships  . Social Herbalist on phone: Patient refused    Gets together: Patient refused    Attends religious service: Patient refused    Active member of club or organization: Patient refused    Attends meetings of clubs or organizations: Patient refused    Relationship status: Patient refused  Other Topics Concern  . Not on file  Social History  Narrative  . Not on file   Additional Social History:                         Sleep: Good  Appetite:  Good  Current Medications: Current Facility-Administered Medications  Medication Dose Route Frequency Provider Last Rate Last Dose  . acetaminophen (TYLENOL) tablet 650 mg  650 mg Oral Q6H PRN Money, Lowry Ram, FNP   650 mg at 08/12/18 2128  . alum & mag hydroxide-simeth (MAALOX/MYLANTA) 200-200-20 MG/5ML suspension 30 mL  30 mL Oral Q4H PRN Money, Lowry Ram, FNP      . atorvastatin (LIPITOR) tablet 10 mg  10 mg Oral q1800 Money, Lowry Ram, FNP   10 mg at 08/12/18 1648  . DULoxetine (CYMBALTA) DR capsule 60 mg  60 mg Oral Daily Clapacs, Madie Reno, MD   60 mg at 08/13/18 0724  . ferrous sulfate tablet 325 mg  325 mg Oral BID WC Money, Lowry Ram, FNP   325 mg at 08/13/18 0724  . gabapentin (NEURONTIN) capsule 600 mg  600 mg Oral TID Clapacs, Madie Reno, MD   600 mg at 08/13/18 0724  . insulin aspart (novoLOG) injection 0-5 Units  0-5 Units Subcutaneous QHS Money, Lowry Ram, FNP   2 Units at 08/09/18 2142  . insulin aspart (novoLOG) injection 0-9 Units  0-9 Units Subcutaneous TID WC Money, Lowry Ram, FNP   5 Units at 08/13/18 0725  . lamoTRIgine (LAMICTAL) tablet 100 mg  100 mg Oral QHS Clapacs, Madie Reno, MD   100 mg at 08/12/18 2127  . levothyroxine (SYNTHROID) tablet 150 mcg  150 mcg Oral Q0600 Money, Lowry Ram, FNP   150 mcg at 08/13/18 7517  . loratadine (CLARITIN) tablet 10 mg  10 mg Oral Daily Clapacs, Madie Reno, MD   10 mg at 08/13/18 0017  . LORazepam (ATIVAN) tablet 2 mg  2 mg Oral Q4H PRN Clapacs, Madie Reno, MD   2 mg at 08/08/18 2138   Or  . LORazepam (ATIVAN) injection 2 mg  2 mg Intramuscular Q4H PRN Clapacs, Madie Reno, MD   2 mg at 08/12/18 0736  . lurasidone (LATUDA) tablet 60 mg  60 mg Oral BID WC Sharma Covert, MD   60 mg at 08/13/18 4944  . magnesium hydroxide (MILK OF MAGNESIA) suspension 30 mL  30 mL Oral Daily PRN Money, Darnelle Maffucci B, FNP      . metFORMIN (GLUCOPHAGE) tablet 850 mg   850 mg Oral BID WC Sharma Covert, MD   850 mg at 08/13/18 0730  . moxifloxacin (VIGAMOX) 0.5 % ophthalmic solution 1 drop  1 drop Right Eye TID Money, Lowry Ram, FNP   1 drop at 08/13/18 0729  . pantoprazole (PROTONIX) EC tablet 40 mg  40 mg Oral Daily Money, Lowry Ram, FNP   40 mg at 08/13/18  0727  . traZODone (DESYREL) tablet 100 mg  100 mg Oral QHS Money, Lowry Ram, FNP   100 mg at 08/12/18 2127  . ziprasidone (GEODON) injection 20 mg  20 mg Intramuscular Q12H PRN Clapacs, Madie Reno, MD   20 mg at 08/12/18 0736    Lab Results:  Results for orders placed or performed during the hospital encounter of 08/04/18 (from the past 48 hour(s))  Glucose, capillary     Status: Abnormal   Collection Time: 08/11/18 11:42 AM  Result Value Ref Range   Glucose-Capillary 277 (H) 70 - 99 mg/dL  Glucose, capillary     Status: Abnormal   Collection Time: 08/11/18  4:43 PM  Result Value Ref Range   Glucose-Capillary 168 (H) 70 - 99 mg/dL  Glucose, capillary     Status: Abnormal   Collection Time: 08/11/18  9:08 PM  Result Value Ref Range   Glucose-Capillary 140 (H) 70 - 99 mg/dL  Hepatic function panel     Status: None   Collection Time: 08/12/18  8:53 AM  Result Value Ref Range   Total Protein 7.1 6.5 - 8.1 g/dL   Albumin 3.7 3.5 - 5.0 g/dL   AST 40 15 - 41 U/L   ALT 42 0 - 44 U/L   Alkaline Phosphatase 74 38 - 126 U/L   Total Bilirubin 0.5 0.3 - 1.2 mg/dL   Bilirubin, Direct <0.1 0.0 - 0.2 mg/dL   Indirect Bilirubin NOT CALCULATED 0.3 - 0.9 mg/dL    Comment: Performed at [facility], [address] [place],  [address]  TSH     Status: None   Collection Time: 08/12/18  8:53 AM  Result Value Ref Range   TSH 0.981 0.350 - 4.500 uIU/mL    Comment: Performed by a 3rd Generation assay with a functional sensitivity of <=0.01 uIU/mL. Performed at [facility], [address] [place],  [address]   CBC with Differential/Platelet     Status: Abnormal   Collection  Time: 08/12/18  8:53 AM  Result Value Ref Range   WBC 4.8 4.0 - 10.5 K/uL   RBC 3.44 (L) 3.87 - 5.11 MIL/uL   Hemoglobin 10.9 (L) 12.0 - 15.0 g/dL   HCT 33.3 (L) 36.0 - 46.0 %   MCV 96.8 80.0 - 100.0 fL   MCH 31.7 26.0 - 34.0 pg   MCHC 32.7 30.0 - 36.0 g/dL   RDW 13.8 11.5 - 15.5 %   Platelets 70 (L) 150 - 400 K/uL    Comment: Immature Platelet Fraction may be clinically indicated, consider ordering this additional test [id number]    nRBC 0.0 0.0 - 0.2 %   Neutrophils Relative % 51 %   Neutro Abs 2.5 1.7 - 7.7 K/uL   Lymphocytes Relative 34 %   Lymphs Abs 1.6 0.7 - 4.0 K/uL   Monocytes Relative 13 %   Monocytes Absolute 0.6 0.1 - 1.0 K/uL   Eosinophils Relative 0 %   Eosinophils Absolute 0.0 0.0 - 0.5 K/uL   Basophils Relative 1 %   Basophils Absolute 0.0 0.0 - 0.1 K/uL   Immature Granulocytes 1 %   Abs Immature Granulocytes 0.04 0.00 - 0.07 K/uL    Comment: Performed at [facility], [address] [place], [place] [address]  Glucose, capillary     Status: Abnormal   Collection Time: 08/12/18 11:21 AM  Result Value Ref Range   Glucose-Capillary 220 (H) 70 - 99 mg/dL   Comment 1 Notify  RN   Glucose, capillary     Status: Abnormal   Collection Time: 08/12/18  4:05 PM  Result Value Ref Range   Glucose-Capillary 308 (H) 70 - 99 mg/dL  Glucose, capillary     Status: Abnormal   Collection Time: 08/12/18  8:36 PM  Result Value Ref Range   Glucose-Capillary 187 (H) 70 - 99 mg/dL  Glucose, capillary     Status: Abnormal   Collection Time: 08/13/18  7:05 AM  Result Value Ref Range   Glucose-Capillary 283 (H) 70 - 99 mg/dL   Comment 1 Notify RN     Blood Alcohol level:  Lab Results  Component Value Date   ETH <10 07/19/2018   ETH <10 25/95/6387    Metabolic Disorder Labs: Lab Results  Component Value Date   HGBA1C 5.3 06/05/2018   MPG 105.41 06/05/2018   MPG 102.54 12/30/2017   No results found for: PROLACTIN Lab Results  Component Value Date   CHOL  155 06/05/2018   TRIG 93 06/05/2018   HDL 54 06/05/2018   CHOLHDL 2.9 06/05/2018   VLDL 19 06/05/2018   LDLCALC 82 06/05/2018   [place] 90 12/30/2017    Physical Findings: AIMS:  , ,  ,  ,    CIWA:    COWS:     Musculoskeletal: Strength & Muscle Tone: within normal limits Gait & Station: normal Patient leans: N/A  Psychiatric Specialty Exam: Physical Exam  Nursing note and vitals reviewed. Constitutional: She is oriented to person, place, and time. She appears well-developed and well-nourished.  HENT:  Head: Normocephalic and atraumatic.  Respiratory: Effort normal.  Neurological: She is alert and oriented to person, place, and time.    ROS  Blood pressure 119/70, pulse 93, temperature 98.3 F (36.8 C), temperature source Oral, resp. rate 16, height 4\' 10"  (1.473 m), weight 72.6 kg, SpO2 91 %.Body mass index is 33.44 kg/m.  General Appearance: Casual  Eye Contact:  Good  Speech:  Normal Rate  Volume:  Normal  Mood:  Anxious  Affect:  Flat  Thought Process:  Coherent and Descriptions of Associations: Loose  Orientation:  Full (Time, Place, and Person)  Thought Content:  Hallucinations: Auditory  Suicidal Thoughts:  No  Homicidal Thoughts:  No  Memory:  Immediate;   Fair Recent;   Fair Remote;   Fair  Judgement:  Intact  Insight:  Fair  Psychomotor Activity:  Increased  Concentration:  Concentration: Fair and Attention Span: Fair  Recall:  [employer] of Knowledge:  Fair  Language:  Good  Akathisia:  Negative  Handed:  Right  AIMS (if indicated):     Assets:  Desire for Improvement Resilience  ADL's:  Intact  Cognition:  Impaired,  Moderate  Sleep:  Number of Hours: 6     Treatment Plan Summary: Daily contact with patient to assess and evaluate symptoms and progress in treatment, Medication management and Plan : Patient is seen and examined.  Patient is a 53 year old female with the above-stated past psychiatric history who is seen in follow-up.    Diagnosis: #1 schizoaffective disorder; bipolar type, #2 intellectual disability, #3 diabetes mellitus type 2, #4 thrombocytopenia, #5 history of hepatitis C, #6 hypertension, #7 hyperlipidemia, #8 peripheral neuropathy, #9 hypothyroidism, #10 GERD  Patient is seen in follow-up.  Her behavior has improved to a degree since the increase in the [place].  She is having auditory hallucinations that sound as though they are command in origin.  They are telling her to  hurt self.  She did not have them this a.m., but hears music this morning.  I am going to increase her dosage to 80 mg p.o. twice daily.  Hopefully she will tolerate the increase in this.  Her placement issues continue.  Her diabetes remains poorly controlled.  Her blood sugar this morning was 283.  Yesterday her metformin was increased to 500 mg p.o. twice daily.  Today I will increase that up to 1000 mg p.o. twice daily.  No other changes in her medications today.  1. Continue Lipitor 10 mg p.o. daily for hyperlipidemia. 2. Continue Cymbalta 60 mg p.o. daily for anxiety and depression. 3. Continue ferrous sulfate 325 mg p.o. twice daily with food for anemia. 4. Continue Neurontin 600 mg p.o. 3 times daily for neuropathy, anxiety and mood stability. 5. Continue sliding scale insulin for now. 6. Increase metformin to 1000 mg p.o. twice daily for diabetes mellitus type 2. 7. Continue Lamictal 100 mg p.o. nightly for mood stability. 8. Continue levothyroxine 150 mcg p.o. daily for hypothyroidism. 9. Continue the Claritin 10 mg p.o. daily for seasonal allergies. 10. Increase Latuda80 mg p.o. twice daily for psychosis and psychotic illness. 11 continue moxifloxacin ophthalmic drops for ocular issues. 12. Continue Protonix 40 mg p.o. daily for GERD. 13. Continue trazodone 100 mg p.o. nightly for sleep. 14. Disposition planning-in progress. Sharma Covert, MD 08/13/2018, 9:21 AM

## 2018-08-13 NOTE — [facility] Group Notes (Signed)
LCSW Group Therapy Note   08/13/2018 1:15pm   Type of Therapy and Topic:  Group Therapy:  Trust and Honesty  Participation Level:  Active  Description of Group:    In this group patients will be asked to explore the value of being honest.  Patients will be guided to discuss their thoughts, feelings, and behaviors related to honesty and trusting in others. Patients will process together how trust and honesty relate to forming relationships with peers, family members, and self. Each patient will be challenged to identify and express feelings of being vulnerable. Patients will discuss reasons why people are dishonest and identify alternative outcomes if one was truthful (to self or others). This group will be process-oriented, with patients participating in exploration of their own experiences, giving and receiving support, and processing challenge from other group members.   Therapeutic Goals: 1. Patient will identify why honesty is important to relationships and how honesty overall affects relationships.  2. Patient will identify a situation where they lied or were lied too and the  feelings, thought process, and behaviors surrounding the situation 3. Patient will identify the meaning of being vulnerable, how that feels, and how that correlates to being honest with self and others. 4. Patient will identify situations where they could have told the truth, but instead lied and explain reasons of dishonesty.   Summary of Patient Progress The patient was able to explore the value of being honest.  Patient discussed thoughts, feelings, and behaviors related to honesty and trusting in others. The patient processed together with other group members how trust and honesty relate to forming relationships with peers, family members, and self. Pt actively and appropriately engaged in the group. Patient was able to provide support and validation to other group members. Patient practiced active listening when  interacting with the facilitator and other group members.    Therapeutic Modalities:   Cognitive Behavioral Therapy Solution Focused Therapy Motivational Interviewing Brief Therapy  Jesse Hirst  CUEBAS-COLON, LCSW 08/13/2018 4:00 PM

## 2018-08-13 NOTE — Plan of Care (Signed)
PT denies depression, anxiety, SI, HI and AVH. Pt was educated on care plan and verbalizes understanding. Collier Bullock RN Problem: Education: Goal: Knowledge of [place] [facility] Education information/materials will improve Outcome: Progressing   Problem: Coping: Goal: Ability to verbalize frustrations and anger appropriately will improve Outcome: Progressing Goal: Ability to demonstrate self-control will improve Outcome: Progressing   Problem: Safety: Goal: Periods of time without injury will increase Outcome: Progressing   Problem: Safety: Goal: Ability to redirect hostility and anger into socially appropriate behaviors will improve Outcome: Progressing   Problem: Activity: Goal: Interest or engagement in leisure activities will improve Outcome: Progressing Goal: Imbalance in normal sleep/wake cycle will improve Outcome: Progressing

## 2018-08-14 LAB — GLUCOSE, CAPILLARY
Glucose-Capillary: 177 mg/dL — ABNORMAL HIGH (ref 70–99)
Glucose-Capillary: 155 mg/dL — ABNORMAL HIGH (ref 70–99)
Glucose-Capillary: 102 mg/dL — ABNORMAL HIGH (ref 70–99)
Glucose-Capillary: 191 mg/dL — ABNORMAL HIGH (ref 70–99)

## 2018-08-14 MED ORDER — BACITRACIN-NEOMYCIN-POLYMYXIN OINTMENT TUBE
TOPICAL_OINTMENT | Freq: Two times a day (BID) | CUTANEOUS | Status: DC
  Administered 2018-08-14 – 2018-08-17 (×2): via TOPICAL
  Administered 2018-08-17: 1 via TOPICAL
  Administered 2018-08-18 – 2018-08-22 (×5): via TOPICAL
  Administered 2018-08-22: 1 via TOPICAL
  Administered 2018-08-23 – 2018-09-08 (×5): via TOPICAL
  Administered 2018-09-14 (×2): 1 via TOPICAL
  Filled 2018-08-14: qty 14.17

## 2018-08-14 NOTE — [facility] Group Notes (Signed)
LCSW Group Therapy Note 08/14/2018 1:15pm  Type of Therapy and Topic: Group Therapy: Feelings Around Returning Home & Establishing a Supportive Framework and Supporting Oneself When Supports Not Available  Participation Level: Active  Description of Group:  Patients first processed thoughts and feelings about upcoming discharge. These included fears of upcoming changes, lack of change, new living environments, judgements and expectations from others and overall stigma of mental health issues. The group then discussed the definition of a supportive framework, what that looks and feels like, and how do to discern it from an unhealthy non-supportive network. The group identified different types of supports as well as what to do when your family/friends are less than helpful or unavailable  Therapeutic Goals  1. Patient will identify one healthy supportive network that they can use at discharge. 2. Patient will identify one factor of a supportive framework and how to tell it from an unhealthy network. 3. Patient able to identify one coping skill to use when they do not have positive supports from others. 4. Patient will demonstrate ability to communicate their needs through discussion and/or role plays.  Summary of Patient Progress:  The patient reported she feels "happy." Pt engaged during group session. As patients processed their anxiety about discharge and described healthy supports patient shared she is not ready to be discharge. She stated, "I will be ready when I find a place to live."  Patients identified at least one self-care tool they were willing to use after discharge.   Therapeutic Modalities Cognitive Behavioral Therapy Motivational Interviewing   Rufus Cypert  CUEBAS-COLON, LCSW 08/14/2018 9:11 AM

## 2018-08-14 NOTE — Progress Notes (Signed)
Stayed in the milieu until bedtime. Went to her room and slept throughout the night. Got up for vital signs. Currently visible in the milieu. Cooperative and has no sign of distress. Safety monitored per unit protocol.

## 2018-08-14 NOTE — Plan of Care (Signed)
Visible and active in the milieu. Cooperative and compliant with treatment. No sign of distress.

## 2018-08-14 NOTE — Progress Notes (Signed)
[facility] MD Progress Note  08/14/2018 11:13 AM Jessica [name]  MRN:  [id number] Subjective:  Patient is a 53 year old female with a past psychiatric history significant for schizoaffective disorder/bipolar disorder who was admitted on 08/05/2018 after transfer from the medical service where she was hospitalized there for spell of hypoxia. It was noted the patient had chronic irritability and with anger and violent outburst at times. She was admitted to the hospital for evaluation and stabilization.  Objective: Patient is seen and examined.  Patient is a 53 year old female with the above-stated past psychiatric history is seen in follow-up.  She stated that the increased dose of Latuda has helped.  She stated she was having auditory and visual hallucinations last night.  She was seeing a "River of blood".  She stated this morning she has not heard any voices or seeing things.  She denied any side effects to the current dose of the [place].  She stated she is trying to be patient with regard to her placement.  Her vital signs are stable, she is afebrile.  She slept 5.5 hours last night.  Her blood sugar is improving.  Her sugars are down to 177 this a.m.  No new laboratories.  Principal Problem: Schizoaffective disorder, bipolar type ([place]) Diagnosis: Principal Problem:   Schizoaffective disorder, bipolar type ([place]) Active Problems:   Diabetes mellitus without complication ([place])   Intellectual disability   Agitation   Borderline personality disorder (HCC)   HTN (hypertension)   Hypothyroidism   Bipolar disorder, unspecified ([place])  Total Time spent with patient: 15 minutes  Past Psychiatric History: See admission H&P  Past Medical History:  Past Medical History:  Diagnosis Date  . Anxiety   . Depression   . Diabetes mellitus without complication ([place])   . GERD (gastroesophageal reflux disease)   . Homicidal ideation   . Hypertension   . Hypothyroidism     Past Surgical History:  Procedure  Laterality Date  . COLPOSCOPY    . FRACTURE SURGERY    . TRACHEOSTOMY     Family History: History reviewed. No pertinent family history. Family Psychiatric  History: See admission H&P Social History:  Social History   Substance and Sexual Activity  Alcohol Use No  . Frequency: Never     Social History   Substance and Sexual Activity  Drug Use Yes  . Types: Marijuana, "Crack" cocaine   Comment: reports she has not done anything in a long time    Social History   Socioeconomic History  . Marital status: Single    Spouse name: Not on file  . Number of children: Not on file  . Years of education: Not on file  . Highest education level: Not on file  Occupational History  . Not on file  Social Needs  . Financial resource strain: Somewhat hard  . Food insecurity    Worry: Patient refused    Inability: Patient refused  . Transportation needs    Medical: Patient refused    Non-medical: Patient refused  Tobacco Use  . Smoking status: Former Research scientist (life sciences)  . Smokeless tobacco: Never Used  Substance and Sexual Activity  . Alcohol use: No    Frequency: Never  . Drug use: Yes    Types: Marijuana, "Crack" cocaine    Comment: reports she has not done anything in a long time  . Sexual activity: Not Currently    Birth control/protection: Abstinence  Lifestyle  . Physical activity    Days per week: Patient refused  Minutes per session: Patient refused  . Stress: Rather much  Relationships  . Social Herbalist on phone: Patient refused    Gets together: Patient refused    Attends religious service: Patient refused    Active member of club or organization: Patient refused    Attends meetings of clubs or organizations: Patient refused    Relationship status: Patient refused  Other Topics Concern  . Not on file  Social History Narrative  . Not on file   Additional Social History:                         Sleep: Fair  Appetite:  Good  Current  Medications: Current Facility-Administered Medications  Medication Dose Route Frequency Provider Last Rate Last Dose  . acetaminophen (TYLENOL) tablet 650 mg  650 mg Oral Q6H PRN Money, Lowry Ram, FNP   650 mg at 08/12/18 2128  . alum & mag hydroxide-simeth (MAALOX/MYLANTA) 200-200-20 MG/5ML suspension 30 mL  30 mL Oral Q4H PRN Money, Lowry Ram, FNP      . atorvastatin (LIPITOR) tablet 10 mg  10 mg Oral q1800 Money, Lowry Ram, FNP   10 mg at 08/13/18 1626  . DULoxetine (CYMBALTA) DR capsule 60 mg  60 mg Oral Daily Clapacs, John T, MD   60 mg at 08/14/18 0800  . ferrous sulfate tablet 325 mg  325 mg Oral BID WC Money, Lowry Ram, FNP   325 mg at 08/14/18 0800  . gabapentin (NEURONTIN) capsule 600 mg  600 mg Oral TID Clapacs, John T, MD   600 mg at 08/14/18 0800  . insulin aspart (novoLOG) injection 0-5 Units  0-5 Units Subcutaneous QHS Money, Lowry Ram, FNP   2 Units at 08/09/18 2142  . insulin aspart (novoLOG) injection 0-9 Units  0-9 Units Subcutaneous TID WC Money, Lowry Ram, FNP   2 Units at 08/14/18 0800  . lamoTRIgine (LAMICTAL) tablet 100 mg  100 mg Oral QHS Clapacs, Madie Reno, MD   100 mg at 08/13/18 2123  . levothyroxine (SYNTHROID) tablet 150 mcg  150 mcg Oral Q0600 Money, Lowry Ram, FNP   150 mcg at 08/14/18 6301  . loratadine (CLARITIN) tablet 10 mg  10 mg Oral Daily Clapacs, John T, MD   10 mg at 08/14/18 0800  . LORazepam (ATIVAN) tablet 2 mg  2 mg Oral Q4H PRN Clapacs, Madie Reno, MD   2 mg at 08/08/18 2138   Or  . LORazepam (ATIVAN) injection 2 mg  2 mg Intramuscular Q4H PRN Clapacs, Madie Reno, MD   2 mg at 08/12/18 0736  . lurasidone (LATUDA) tablet 80 mg  80 mg Oral BID WC Sharma Covert, MD   80 mg at 08/14/18 0800  . magnesium hydroxide (MILK OF MAGNESIA) suspension 30 mL  30 mL Oral Daily PRN Money, Darnelle Maffucci B, FNP      . metFORMIN (GLUCOPHAGE) tablet 1,000 mg  1,000 mg Oral BID WC Sharma Covert, MD   1,000 mg at 08/14/18 0800  . moxifloxacin (VIGAMOX) 0.5 % ophthalmic solution 1 drop  1  drop Right Eye TID Money, Lowry Ram, FNP   1 drop at 08/14/18 0800  . pantoprazole (PROTONIX) EC tablet 40 mg  40 mg Oral Daily Money, Travis B, FNP   40 mg at 08/14/18 0800  . traZODone (DESYREL) tablet 100 mg  100 mg Oral QHS Money, Travis B, FNP   100 mg at 08/13/18 2123  .  ziprasidone (GEODON) injection 20 mg  20 mg Intramuscular Q12H PRN Clapacs, Madie Reno, MD   20 mg at 08/12/18 0736    Lab Results:  Results for orders placed or performed during the hospital encounter of 08/04/18 (from the past 48 hour(s))  Glucose, capillary     Status: Abnormal   Collection Time: 08/12/18 11:21 AM  Result Value Ref Range   Glucose-Capillary 220 (H) 70 - 99 mg/dL   Comment 1 Notify RN   Glucose, capillary     Status: Abnormal   Collection Time: 08/12/18  4:05 PM  Result Value Ref Range   Glucose-Capillary 308 (H) 70 - 99 mg/dL  Glucose, capillary     Status: Abnormal   Collection Time: 08/12/18  8:36 PM  Result Value Ref Range   Glucose-Capillary 187 (H) 70 - 99 mg/dL  Glucose, capillary     Status: Abnormal   Collection Time: 08/13/18  7:05 AM  Result Value Ref Range   Glucose-Capillary 283 (H) 70 - 99 mg/dL   Comment 1 Notify RN   Glucose, capillary     Status: Abnormal   Collection Time: 08/13/18 11:25 AM  Result Value Ref Range   Glucose-Capillary 135 (H) 70 - 99 mg/dL  Glucose, capillary     Status: Abnormal   Collection Time: 08/13/18  4:22 PM  Result Value Ref Range   Glucose-Capillary 160 (H) 70 - 99 mg/dL  Glucose, capillary     Status: Abnormal   Collection Time: 08/13/18  9:20 PM  Result Value Ref Range   Glucose-Capillary 137 (H) 70 - 99 mg/dL  Glucose, capillary     Status: Abnormal   Collection Time: 08/14/18  7:00 AM  Result Value Ref Range   Glucose-Capillary 177 (H) 70 - 99 mg/dL   Comment 1 Notify RN     Blood Alcohol level:  Lab Results  Component Value Date   ETH <10 07/19/2018   ETH <10 72/53/6644    Metabolic Disorder Labs: Lab Results  Component Value  Date   HGBA1C 5.3 06/05/2018   MPG 105.41 06/05/2018   MPG 102.54 12/30/2017   No results found for: PROLACTIN Lab Results  Component Value Date   CHOL 155 06/05/2018   TRIG 93 06/05/2018   HDL 54 06/05/2018   CHOLHDL 2.9 06/05/2018   VLDL 19 06/05/2018   LDLCALC 82 06/05/2018   [place] 90 12/30/2017    Physical Findings: AIMS:  , ,  ,  ,    CIWA:    COWS:     Musculoskeletal: Strength & Muscle Tone: within normal limits Gait & Station: normal Patient leans: N/A  Psychiatric Specialty Exam: Physical Exam  Nursing note and vitals reviewed. Constitutional: She is oriented to person, place, and time. She appears well-developed and well-nourished.  HENT:  Head: Normocephalic and atraumatic.  Respiratory: Effort normal.  Neurological: She is alert and oriented to person, place, and time.    ROS  Blood pressure 123/65, pulse 72, temperature 98.3 F (36.8 C), temperature source Oral, resp. rate 16, height 4\' 10"  (1.473 m), weight 72.6 kg, SpO2 97 %.Body mass index is 33.44 kg/m.  General Appearance: Casual  Eye Contact:  Good  Speech:  Normal Rate  Volume:  Normal  Mood:  Anxious  Affect:  Congruent  Thought Process:  Goal Directed and Descriptions of Associations: Intact  Orientation:  Full (Time, Place, and Person)  Thought Content:  Hallucinations: Auditory Visual  Suicidal Thoughts:  No  Homicidal Thoughts:  No  Memory:  Immediate;   Fair Recent;   Fair Remote;   Fair  Judgement:  Intact  Insight:  Fair  Psychomotor Activity:  Increased  Concentration:  Concentration: Fair and Attention Span: Fair  Recall:  [employer] of Knowledge:  Fair  Language:  Good  Akathisia:  Negative  Handed:  Right  AIMS (if indicated):     Assets:  Desire for Improvement Resilience  ADL's:  Intact  Cognition:  WNL  Sleep:  Number of Hours: 5.5     Treatment Plan Summary: Daily contact with patient to assess and evaluate symptoms and progress in treatment, Medication  management and Plan : Patient is seen and examined.  Patient is a 53 year old female with the above-stated past psychiatric history who is seen in follow-up.   Diagnosis: #1 schizoaffective disorder; bipolar type, #2 intellectual disability, #3 diabetes mellitus type 2, #4 thrombocytopenia, #5 history of hepatitis C, #6 hypertension, #7 hyperlipidemia, #8 peripheral neuropathy, #9 hypothyroidism, #10 GERD  Patient is seen in follow-up.  She is doing better today with increased dose of Latuda.  No other changes in her medications.  Her blood sugar is improving, and is 1-77 this a.m.  She continues on sliding scale insulin as well as the thousand milligrams of Glucophage twice daily.  No changes in her medications today.  Placement continues.  1. Continue Lipitor 10 mg p.o. daily for hyperlipidemia. 2. Continue Cymbalta 60 mg p.o. daily for anxiety and depression. 3. Continue ferrous sulfate 325 mg p.o. twice daily with food for anemia. 4. Continue Neurontin 600 mg p.o. 3 times daily for neuropathy, anxiety and mood stability. 5. Continue sliding scale insulin for now. 6. Increasemetforminto1000 mg p.o.twice dailyfor diabetes mellitus type 2. 7. Continue Lamictal 100 mg p.o. nightly for mood stability. 8. Continue levothyroxine 150 mcg p.o. daily for hypothyroidism. 9. Continue the Claritin 10 mg p.o. daily for seasonal allergies. 10. ContinueLatuda80 mg p.o. twice daily for psychosis and psychotic illness. 11 continue moxifloxacin ophthalmic drops for ocular issues. 12. Continue Protonix 40 mg p.o. daily for GERD. 13. Continue trazodone 100 mg p.o. nightly for sleep. 14.Disposition planning-in progress.  Sharma Covert, MD 08/14/2018, 11:13 AM

## 2018-08-14 NOTE — Plan of Care (Signed)
D- Patient alert and oriented. Patient presents in a pleasant mood on assessment stating that she slept "great" last night and had no major complaints to voice to this Probation officer. Patient denies any depression/anxiety stating "I'm happy today, I'm finally getting there". Patient also denies SI, HI, AVH, and pain, reporting "I never have pain". Patient's goal for today is to "talk to my doctor and social worker", in which she will "go to groups" in order to accomplish her goal.  A- Scheduled medications administered to patient, per MD orders. Support and encouragement provided.  Routine safety checks conducted every 15 minutes.  Patient informed to notify staff with problems or concerns.  R- No adverse drug reactions noted. Patient contracts for safety at this time. Patient compliant with medications and treatment plan. Patient receptive, calm, and cooperative. Patient interacts well with others on the unit.  Patient remains safe at this time.  Problem: Education: Goal: Knowledge of [place] [facility] Education information/materials will improve Outcome: Progressing   Problem: Coping: Goal: Ability to verbalize frustrations and anger appropriately will improve Outcome: Progressing Goal: Ability to demonstrate self-control will improve Outcome: Progressing   Problem: Safety: Goal: Periods of time without injury will increase Outcome: Progressing   Problem: Safety: Goal: Ability to redirect hostility and anger into socially appropriate behaviors will improve Outcome: Progressing   Problem: Activity: Goal: Interest or engagement in leisure activities will improve Outcome: Progressing Goal: Imbalance in normal sleep/wake cycle will improve Outcome: Progressing

## 2018-08-14 NOTE — Plan of Care (Signed)
Pleasant and cooperative. Thought process improved. Alert and oriented and denying thoughts of self harm. Reports that she had some hallucinations earlier (seeing snakes in hands) "but they are gone".  Patient asked about discharge plan saying "I am tired of being here, I want to return to that group home...". patient was encouraged  to discuss it with social worker in AM. Patient is otherwise cooperative and compliant with treatment.

## 2018-08-15 LAB — GLUCOSE, CAPILLARY
Glucose-Capillary: 202 mg/dL — ABNORMAL HIGH (ref 70–99)
Glucose-Capillary: 239 mg/dL — ABNORMAL HIGH (ref 70–99)
Glucose-Capillary: 121 mg/dL — ABNORMAL HIGH (ref 70–99)
Glucose-Capillary: 98 mg/dL (ref 70–99)

## 2018-08-15 NOTE — Progress Notes (Signed)
CSW received a call back from pts legal guardian who reported that she spoke with cardinal and CSW would need to complete the form and fax it to cardinal and then cardinal will send it to the [facility]. Tedra Coupe reported that she was told to be very detailed with the aggression pt has. Tedra Coupe asked a copy be faxed to her as well.  Evalina Field, MSW, LCSW Clinical Social Work 08/15/2018 3:33 PM

## 2018-08-15 NOTE — [facility] Counselor (Signed)
CSW completed and faxed exception diverson form to [employer], awaiting response. CSW received phone call from [facility] who states they are awaiting approval for exception before they can proceed.

## 2018-08-15 NOTE — Progress Notes (Signed)
[facility] MD Progress Note  08/15/2018 4:39 PM Jessica [name]  MRN:  [id number] Subjective: Follow-up for this patient with developmental disability schizoaffective disorder behavior problems.  She has calm down significantly since last week.  No longer angry hostile or aggressive.  Actually came to speak to me today and apologized.  Still feels a little bit tired but not to the degree that is profoundly impairing.  No suicidal or homicidal thoughts.  Remains very difficult to find placement for her.  Diabetes under pretty good control. Principal Problem: Schizoaffective disorder, bipolar type ([place]) Diagnosis: Principal Problem:   Schizoaffective disorder, bipolar type ([place]) Active Problems:   Diabetes mellitus without complication ([place])   Intellectual disability   Agitation   Borderline personality disorder (HCC)   HTN (hypertension)   Hypothyroidism   Bipolar disorder, unspecified ([place])  Total Time spent with patient: 30 minutes  Past Psychiatric History: Long history of behavior problems with developmental disability  Past Medical History:  Past Medical History:  Diagnosis Date  . Anxiety   . Depression   . Diabetes mellitus without complication ([place])   . GERD (gastroesophageal reflux disease)   . Homicidal ideation   . Hypertension   . Hypothyroidism     Past Surgical History:  Procedure Laterality Date  . COLPOSCOPY    . FRACTURE SURGERY    . TRACHEOSTOMY     Family History: History reviewed. No pertinent family history. Family Psychiatric  History: See previous Social History:  Social History   Substance and Sexual Activity  Alcohol Use No  . Frequency: Never     Social History   Substance and Sexual Activity  Drug Use Yes  . Types: Marijuana, "Crack" cocaine   Comment: reports she has not done anything in a long time    Social History   Socioeconomic History  . Marital status: Single    Spouse name: Not on file  . Number of children: Not on file  . Years of  education: Not on file  . Highest education level: Not on file  Occupational History  . Not on file  Social Needs  . Financial resource strain: Somewhat hard  . Food insecurity    Worry: Patient refused    Inability: Patient refused  . Transportation needs    Medical: Patient refused    Non-medical: Patient refused  Tobacco Use  . Smoking status: Former Research scientist (life sciences)  . Smokeless tobacco: Never Used  Substance and Sexual Activity  . Alcohol use: No    Frequency: Never  . Drug use: Yes    Types: Marijuana, "Crack" cocaine    Comment: reports she has not done anything in a long time  . Sexual activity: Not Currently    Birth control/protection: Abstinence  Lifestyle  . Physical activity    Days per week: Patient refused    Minutes per session: Patient refused  . Stress: Rather much  Relationships  . Social Herbalist on phone: Patient refused    Gets together: Patient refused    Attends religious service: Patient refused    Active member of club or organization: Patient refused    Attends meetings of clubs or organizations: Patient refused    Relationship status: Patient refused  Other Topics Concern  . Not on file  Social History Narrative  . Not on file   Additional Social History:  Sleep: Fair  Appetite:  Fair  Current Medications: Current Facility-Administered Medications  Medication Dose Route Frequency Provider Last Rate Last Dose  . acetaminophen (TYLENOL) tablet 650 mg  650 mg Oral Q6H PRN Money, Lowry Ram, FNP   650 mg at 08/15/18 1142  . alum & mag hydroxide-simeth (MAALOX/MYLANTA) 200-200-20 MG/5ML suspension 30 mL  30 mL Oral Q4H PRN Money, Lowry Ram, FNP      . atorvastatin (LIPITOR) tablet 10 mg  10 mg Oral q1800 Money, Lowry Ram, FNP   10 mg at 08/14/18 1700  . DULoxetine (CYMBALTA) DR capsule 60 mg  60 mg Oral Daily Shellie Rogoff, Madie Reno, MD   60 mg at 08/15/18 0802  . ferrous sulfate tablet 325 mg  325 mg Oral BID WC  Money, Lowry Ram, FNP   325 mg at 08/15/18 0802  . gabapentin (NEURONTIN) capsule 600 mg  600 mg Oral TID Demorio Seeley, Madie Reno, MD   600 mg at 08/15/18 1142  . insulin aspart (novoLOG) injection 0-5 Units  0-5 Units Subcutaneous QHS Money, Lowry Ram, FNP   2 Units at 08/09/18 2142  . insulin aspart (novoLOG) injection 0-9 Units  0-9 Units Subcutaneous TID WC Money, Lowry Ram, FNP   3 Units at 08/15/18 1142  . lamoTRIgine (LAMICTAL) tablet 100 mg  100 mg Oral QHS Jadakiss Barish T, MD   100 mg at 08/14/18 2106  . levothyroxine (SYNTHROID) tablet 150 mcg  150 mcg Oral Q0600 Money, Lowry Ram, FNP   150 mcg at 08/15/18 1610  . loratadine (CLARITIN) tablet 10 mg  10 mg Oral Daily Kamarii Buren, Madie Reno, MD   10 mg at 08/15/18 0802  . LORazepam (ATIVAN) tablet 2 mg  2 mg Oral Q4H PRN Rodnisha Blomgren, Madie Reno, MD   2 mg at 08/08/18 2138   Or  . LORazepam (ATIVAN) injection 2 mg  2 mg Intramuscular Q4H PRN Marlynn Hinckley, Madie Reno, MD   2 mg at 08/15/18 1130  . lurasidone (LATUDA) tablet 80 mg  80 mg Oral BID WC Sharma Covert, MD   80 mg at 08/15/18 0802  . magnesium hydroxide (MILK OF MAGNESIA) suspension 30 mL  30 mL Oral Daily PRN Money, Darnelle Maffucci B, FNP      . metFORMIN (GLUCOPHAGE) tablet 1,000 mg  1,000 mg Oral BID WC Sharma Covert, MD   1,000 mg at 08/15/18 0802  . moxifloxacin (VIGAMOX) 0.5 % ophthalmic solution 1 drop  1 drop Right Eye TID Money, Lowry Ram, FNP   1 drop at 08/15/18 1143  . neomycin-bacitracin-polymyxin (NEOSPORIN) ointment   Topical BID Sharma Covert, MD      . pantoprazole (PROTONIX) EC tablet 40 mg  40 mg Oral Daily Money, Lowry Ram, FNP   40 mg at 08/15/18 0802  . traZODone (DESYREL) tablet 100 mg  100 mg Oral QHS Money, Travis B, FNP   100 mg at 08/14/18 2106  . ziprasidone (GEODON) injection 20 mg  20 mg Intramuscular Q12H PRN Claris Pech, Madie Reno, MD   20 mg at 08/15/18 1130    Lab Results:  Results for orders placed or performed during the hospital encounter of 08/04/18 (from the past 48 hour(s))   Glucose, capillary     Status: Abnormal   Collection Time: 08/13/18  9:20 PM  Result Value Ref Range   Glucose-Capillary 137 (H) 70 - 99 mg/dL  Glucose, capillary     Status: Abnormal   Collection Time: 08/14/18  7:00 AM  Result Value Ref Range  Glucose-Capillary 177 (H) 70 - 99 mg/dL   Comment 1 Notify RN   Glucose, capillary     Status: Abnormal   Collection Time: 08/14/18 11:28 AM  Result Value Ref Range   Glucose-Capillary 155 (H) 70 - 99 mg/dL  Glucose, capillary     Status: Abnormal   Collection Time: 08/14/18  4:17 PM  Result Value Ref Range   Glucose-Capillary 102 (H) 70 - 99 mg/dL  Glucose, capillary     Status: Abnormal   Collection Time: 08/14/18  9:04 PM  Result Value Ref Range   Glucose-Capillary 191 (H) 70 - 99 mg/dL  Glucose, capillary     Status: Abnormal   Collection Time: 08/15/18  7:02 AM  Result Value Ref Range   Glucose-Capillary 202 (H) 70 - 99 mg/dL   Comment 1 Notify RN   Glucose, capillary     Status: Abnormal   Collection Time: 08/15/18 11:39 AM  Result Value Ref Range   Glucose-Capillary 239 (H) 70 - 99 mg/dL  Glucose, capillary     Status: Abnormal   Collection Time: 08/15/18  4:23 PM  Result Value Ref Range   Glucose-Capillary 121 (H) 70 - 99 mg/dL    Blood Alcohol level:  Lab Results  Component Value Date   ETH <10 07/19/2018   ETH <10 39/76/7341    Metabolic Disorder Labs: Lab Results  Component Value Date   HGBA1C 5.3 06/05/2018   MPG 105.41 06/05/2018   MPG 102.54 12/30/2017   No results found for: PROLACTIN Lab Results  Component Value Date   CHOL 155 06/05/2018   TRIG 93 06/05/2018   HDL 54 06/05/2018   CHOLHDL 2.9 06/05/2018   VLDL 19 06/05/2018   LDLCALC 82 06/05/2018   [place] 90 12/30/2017    Physical Findings: AIMS:  , ,  ,  ,    CIWA:    COWS:     Musculoskeletal: Strength & Muscle Tone: within normal limits Gait & Station: normal Patient leans: N/A  Psychiatric Specialty Exam: Physical Exam  Nursing  note and vitals reviewed. Constitutional: She appears well-developed and well-nourished.  HENT:  Head: Normocephalic and atraumatic.  Eyes: Pupils are equal, round, and reactive to light. Conjunctivae are normal.  Neck: Normal range of motion.  Cardiovascular: Regular rhythm and normal heart sounds.  Respiratory: Effort normal.  GI: Soft.  Musculoskeletal: Normal range of motion.  Neurological: She is alert.  Skin: Skin is warm and dry.  Psychiatric: She has a normal mood and affect. Her behavior is normal. Judgment and thought content normal.    Review of Systems  Constitutional: Negative.   HENT: Negative.   Eyes: Negative.   Respiratory: Negative.   Cardiovascular: Negative.   Gastrointestinal: Negative.   Musculoskeletal: Negative.   Skin: Negative.   Neurological: Negative.   Psychiatric/Behavioral: Negative.     Blood pressure 117/68, pulse 82, temperature 97.6 F (36.4 C), temperature source Oral, resp. rate 17, height 4\' 10"  (1.473 m), weight 72.6 kg, SpO2 98 %.Body mass index is 33.44 kg/m.  General Appearance: Casual  Eye Contact:  Fair  Speech:  Clear and Coherent  Volume:  Normal  Mood:  Euthymic  Affect:  Constricted  Thought Process:  Disorganized  Orientation:  Full (Time, Place, and Person)  Thought Content:  Logical  Suicidal Thoughts:  No  Homicidal Thoughts:  No  Memory:  Immediate;   Fair Recent;   Fair Remote;   Fair  Judgement:  Fair  Insight:  Shallow  Psychomotor  Activity:  Decreased  Concentration:  Concentration: Fair  Recall:  [employer] of Knowledge:  Fair  Language:  Fair  Akathisia:  No  Handed:  Right  AIMS (if indicated):     Assets:  Desire for Improvement  ADL's:  Intact  Cognition:  WNL  Sleep:  Number of Hours: 7     Treatment Plan Summary: Daily contact with patient to assess and evaluate symptoms and progress in treatment, Medication management and Plan Continue current medicine profile.  Supportive counseling and  therapy.  Review of treatment plan.  Social work and treatment team still actively looking for some kind of disposition plan.  Alethia Berthold, MD 08/15/2018, 4:39 PM

## 2018-08-15 NOTE — Progress Notes (Signed)
This Probation officer heard patient yelling "you all can go to [place]" into the hallway and when this writer looked down towards patient's room she has thrown the contents of her trash as well as her towels and wash cloths out into the hallway.

## 2018-08-15 NOTE — Progress Notes (Signed)
Patient just threw her hampers out of her room, so this writer took them away and placed them in a locked room.

## 2018-08-15 NOTE — Tx Team (Signed)
Interdisciplinary Treatment and Diagnostic Plan Update  08/15/2018 Time of Session: 8:30am Jessica [name] MRN: [id number]  Principal Diagnosis: Schizoaffective disorder, bipolar type ([place])  Secondary Diagnoses: Principal Problem:   Schizoaffective disorder, bipolar type ([place]) Active Problems:   Diabetes mellitus without complication ([place])   Intellectual disability   Agitation   Borderline personality disorder ([place])   HTN (hypertension)   Hypothyroidism   Bipolar disorder, unspecified ([place])   Current Medications:  Current Facility-Administered Medications  Medication Dose Route Frequency Provider Last Rate Last Dose  . acetaminophen (TYLENOL) tablet 650 mg  650 mg Oral Q6H PRN Money, Lowry Ram, FNP   650 mg at 08/12/18 2128  . alum & mag hydroxide-simeth (MAALOX/MYLANTA) 200-200-20 MG/5ML suspension 30 mL  30 mL Oral Q4H PRN Money, Lowry Ram, FNP      . atorvastatin (LIPITOR) tablet 10 mg  10 mg Oral q1800 Money, Lowry Ram, FNP   10 mg at 08/14/18 1700  . DULoxetine (CYMBALTA) DR capsule 60 mg  60 mg Oral Daily Clapacs, Madie Reno, MD   60 mg at 08/15/18 0802  . ferrous sulfate tablet 325 mg  325 mg Oral BID WC Money, Lowry Ram, FNP   325 mg at 08/15/18 0802  . gabapentin (NEURONTIN) capsule 600 mg  600 mg Oral TID Clapacs, Madie Reno, MD   600 mg at 08/15/18 0802  . insulin aspart (novoLOG) injection 0-5 Units  0-5 Units Subcutaneous QHS Money, Lowry Ram, FNP   2 Units at 08/09/18 2142  . insulin aspart (novoLOG) injection 0-9 Units  0-9 Units Subcutaneous TID WC Money, Lowry Ram, FNP   3 Units at 08/15/18 0801  . lamoTRIgine (LAMICTAL) tablet 100 mg  100 mg Oral QHS Clapacs, John T, MD   100 mg at 08/14/18 2106  . levothyroxine (SYNTHROID) tablet 150 mcg  150 mcg Oral Q0600 Money, Lowry Ram, FNP   150 mcg at 08/15/18 7829  . loratadine (CLARITIN) tablet 10 mg  10 mg Oral Daily Clapacs, Madie Reno, MD   10 mg at 08/15/18 0802  . LORazepam (ATIVAN) tablet 2 mg  2 mg Oral Q4H PRN Clapacs, Madie Reno, MD   2  mg at 08/08/18 2138   Or  . LORazepam (ATIVAN) injection 2 mg  2 mg Intramuscular Q4H PRN Clapacs, Madie Reno, MD   2 mg at 08/12/18 0736  . lurasidone (LATUDA) tablet 80 mg  80 mg Oral BID WC Sharma Covert, MD   80 mg at 08/15/18 0802  . magnesium hydroxide (MILK OF MAGNESIA) suspension 30 mL  30 mL Oral Daily PRN Money, Darnelle Maffucci B, FNP      . metFORMIN (GLUCOPHAGE) tablet 1,000 mg  1,000 mg Oral BID WC Sharma Covert, MD   1,000 mg at 08/15/18 0802  . moxifloxacin (VIGAMOX) 0.5 % ophthalmic solution 1 drop  1 drop Right Eye TID Money, Lowry Ram, FNP   1 drop at 08/15/18 0802  . neomycin-bacitracin-polymyxin (NEOSPORIN) ointment   Topical BID Sharma Covert, MD      . pantoprazole (PROTONIX) EC tablet 40 mg  40 mg Oral Daily Money, Lowry Ram, FNP   40 mg at 08/15/18 0802  . traZODone (DESYREL) tablet 100 mg  100 mg Oral QHS Money, Travis B, FNP   100 mg at 08/14/18 2106  . ziprasidone (GEODON) injection 20 mg  20 mg Intramuscular Q12H PRN Clapacs, Madie Reno, MD   20 mg at 08/12/18 0736   PTA Medications: Medications Prior to Admission  Medication Sig Dispense Refill Last Dose  . atorvastatin (LIPITOR) 10 MG tablet Take 1 tablet (10 mg total) by mouth daily. 30 tablet 1   . carvedilol (COREG) 6.25 MG tablet Take 1 tablet (6.25 mg total) by mouth 2 (two) times daily. 60 tablet 1   . divalproex (DEPAKOTE) 250 MG DR tablet Take 1 tablet (250 mg total) by mouth 2 (two) times daily. 60 tablet 0   . ferrous sulfate (FEROSUL) 325 (65 FE) MG tablet Take 1 tablet (325 mg total) by mouth 2 (two) times daily. 60 tablet 1   . gabapentin (NEURONTIN) 600 MG tablet Take 1 tablet (600 mg total) by mouth 3 (three) times daily. 90 tablet 1   . glipiZIDE (GLUCOTROL XL) 5 MG 24 hr tablet Take 5 mg by mouth daily.     . insulin detemir (LEVEMIR) 100 UNIT/ML injection Inject 0.1 mLs (10 Units total) into the skin 2 (two) times daily. (Patient not taking: Reported on 07/21/2018) 10 mL 11   . lamoTRIgine (LAMICTAL) 25  MG tablet Take 1 tablet (25 mg total) by mouth 2 (two) times daily. 60 tablet 0   . levothyroxine (SYNTHROID) 150 MCG tablet Take 1 tablet (150 mcg total) by mouth daily at 6 (six) AM. 30 tablet 1   . lisinopril (ZESTRIL) 10 MG tablet Take 1 tablet (10 mg total) by mouth daily. 30 tablet 1   . loratadine (CLARITIN) 10 MG tablet Take 1 tablet (10 mg total) by mouth daily. 30 tablet 1   . metFORMIN (GLUCOPHAGE) 500 MG tablet Take 1 tablet (500 mg total) by mouth 2 (two) times daily. 60 tablet 1   . moxifloxacin (VIGAMOX) 0.5 % ophthalmic solution Place 1 drop into the right eye 3 (three) times daily. For 5 days 3 mL 0   . paliperidone (INVEGA) 3 MG 24 hr tablet Take 1 tablet (3 mg total) by mouth daily. 30 tablet 0   . pantoprazole (PROTONIX) 40 MG tablet Take 1 tablet (40 mg total) by mouth daily. 30 tablet 1     Patient Stressors: Health problems Legal issue Other: Artist  Patient Strengths: Active sense of humor Religious Affiliation  Treatment Modalities: Medication Management, Group therapy, Case management,  1 to 1 session with clinician, Psychoeducation, Recreational therapy.   Physician Treatment Plan for Primary Diagnosis: Schizoaffective disorder, bipolar type ([place]) Long Term Goal(s): Improvement in symptoms so as ready for discharge Improvement in symptoms so as ready for discharge   Short Term Goals: Ability to demonstrate self-control will improve Ability to identify and develop effective coping behaviors will improve Ability to maintain clinical measurements within normal limits will improve Compliance with prescribed medications will improve  Medication Management: Evaluate patient's response, side effects, and tolerance of medication regimen.  Therapeutic Interventions: 1 to 1 sessions, Unit Group sessions and Medication administration.  Evaluation of Outcomes: Progressing  Physician Treatment Plan for Secondary Diagnosis: Principal Problem:    Schizoaffective disorder, bipolar type ([place]) Active Problems:   Diabetes mellitus without complication ([place])   Intellectual disability   Agitation   Borderline personality disorder (HCC)   HTN (hypertension)   Hypothyroidism   Bipolar disorder, unspecified ([place])  Long Term Goal(s): Improvement in symptoms so as ready for discharge Improvement in symptoms so as ready for discharge   Short Term Goals: Ability to demonstrate self-control will improve Ability to identify and develop effective coping behaviors will improve Ability to maintain clinical measurements within normal limits will improve Compliance with prescribed medications will improve  Medication Management: Evaluate patient's response, side effects, and tolerance of medication regimen.  Therapeutic Interventions: 1 to 1 sessions, Unit Group sessions and Medication administration.  Evaluation of Outcomes: Progressing   RN Treatment Plan for Primary Diagnosis: Schizoaffective disorder, bipolar type ([place]) Long Term Goal(s): Knowledge of disease and therapeutic regimen to maintain health will improve  Short Term Goals: Ability to verbalize frustration and anger appropriately will improve, Ability to demonstrate self-control, Ability to participate in decision making will improve, Ability to verbalize feelings will improve and Ability to identify and develop effective coping behaviors will improve  Medication Management: RN will administer medications as ordered by provider, will assess and evaluate patient's response and provide education to patient for prescribed medication. RN will report any adverse and/or side effects to prescribing provider.  Therapeutic Interventions: 1 on 1 counseling sessions, Psychoeducation, Medication administration, Evaluate responses to treatment, Monitor vital signs and CBGs as ordered, Perform/monitor CIWA, COWS, AIMS and Fall Risk screenings as ordered, Perform wound care treatments as  ordered.  Evaluation of Outcomes: Progressing   LCSW Treatment Plan for Primary Diagnosis: Schizoaffective disorder, bipolar type ([place]) Long Term Goal(s): Safe transition to appropriate next level of care at discharge, Engage patient in therapeutic group addressing interpersonal concerns.  Short Term Goals: Engage patient in aftercare planning with referrals and resources, Increase social support, Increase ability to appropriately verbalize feelings, Increase emotional regulation and Facilitate acceptance of mental health diagnosis and concerns  Therapeutic Interventions: Assess for all discharge needs, 1 to 1 time with Social worker, Explore available resources and support systems, Assess for adequacy in community support network, Educate family and significant other(s) on suicide prevention, Complete Psychosocial Assessment, Interpersonal group therapy.  Evaluation of Outcomes: Progressing   Progress in Treatment: Attending groups: Yes. Participating in groups: Yes. Taking medication as prescribed: Yes. Toleration medication: Yes. Family/Significant other contact made: Yes, individual(s) contacted:  Pts legal guardian Jessica [name] Patient understands diagnosis: Yes. Discussing patient identified problems/goals with staff: Yes. Medical problems stabilized or resolved: Yes. Denies suicidal/homicidal ideation: Yes. Issues/concerns per patient self-inventory: No. Other: none  New problem(s) identified: No, Describe:  none  New Short Term/Long Term Goal(s): medication management for mood stabilization; elimination of SI thoughts; development of comprehensive mental wellness plan.  Patient Goals:  "To find a new group home"  Discharge Plan or Barriers: At this time the patient is unable to return to her group home due to aggressive behaviors.  Patient will need support in identifying alternative placement.  Update 7/27:  Referral for [facility]) has been re-faxed  several times.  CSW team has been in contact with [facility] several times who report each time that they do not have the information and on 7/24 the information was sent for the third time.      Reason for Continuation of Hospitalization: Medication stabilization  Estimated Length of Stay:  TBD  Recreational Therapy: Patient: N/A Patient Goal: Patient will demonstrate no violent or destructive behaviors during recreation therapy group sessions for duration of admission  Attendees: Patient:  08/15/2018 9:14 AM  Physician: Dr. Weber Cooks, MD 08/15/2018 9:14 AM  Nursing:  08/15/2018 9:14 AM  RN Care Manager: 08/15/2018 9:14 AM  Social Worker: Assunta Curtis LCSW 08/15/2018 9:14 AM  Recreational Therapist:  08/15/2018 9:14 AM  Other:  08/15/2018 9:14 AM  Other:  08/15/2018 9:14 AM  Other: 08/15/2018 9:14 AM    Scribe for Treatment Team: Rozann Lesches, LCSW 08/15/2018 9:14 AM

## 2018-08-15 NOTE — BHH Counselor (Signed)
CSW contacted [facility] to assess for status of referral.   CSW spoke with Pamala Hurry.  CSW was informed that at this time the patient is NOT on the waiting list due needing an "exception diversion form" completed.  Reportedly the form is needed due to the patient's IDD diagnosis.  CSW pointed out that [facility] has not made CSW staff aware of this in the past when staff have called to check on the status.  Instead CSWs have been informed that they did not have the referral information and referrals have had to be sent several times.    CSW asked how to find the form and was instructed to Google "[place] diversion" and the form would "pop right on up, I'm sure somewhere there is familiar with the form".  CSW informed that we are not and this is the first this form has been mentioned.   CSW will follow up on the form.  Assunta Curtis, MSW, LCSW 08/15/2018 9:24 AM

## 2018-08-15 NOTE — B[facility] Group Notes (Signed)

## 2018-08-15 NOTE — Plan of Care (Signed)
D- Patient alert and oriented. Patient presents in a pleasant mood on assessment reporting that she slept good last night although she "had sone dreams". Patient had no complaints or concerns to voice to this Probation officer. Patient denies SI, HI, AVH, and pain at this time. Patient also denies any depression/anxiety stating that she is feeling "happy" today. Patient's goal for today is to "stop touching my face".   A- Scheduled medications administered to patient, per MD orders. Support and encouragement provided.  Routine safety checks conducted every 15 minutes.  Patient informed to notify staff with problems or concerns.  R- No adverse drug reactions noted. Patient contracts for safety at this time. Patient compliant with medications and treatment plan. Patient receptive, calm, and cooperative. Patient interacts well with others on the unit.  Patient remains safe at this time.  Problem: Education: Goal: Knowledge of [facility] Education information/materials will improve Outcome: Progressing   Problem: Coping: Goal: Ability to verbalize frustrations and anger appropriately will improve Outcome: Progressing Goal: Ability to demonstrate self-control will improve Outcome: Progressing   Problem: Safety: Goal: Periods of time without injury will increase Outcome: Progressing   Problem: Safety: Goal: Ability to redirect hostility and anger into socially appropriate behaviors will improve Outcome: Progressing   Problem: Activity: Goal: Interest or engagement in leisure activities will improve Outcome: Progressing Goal: Imbalance in normal sleep/wake cycle will improve Outcome: Progressing

## 2018-08-15 NOTE — Progress Notes (Signed)
Patient slept throughout the night and woke up in the morning with a pleasant mood.  Currently denying hallucinations. Safety monitored per 8mn.

## 2018-08-15 NOTE — Progress Notes (Signed)
CSW left a vm for Fayne Norrie at [phone] to follow up about status of exception form for pts referral to [facility].   Evalina Field, MSW, LCSW Clinical Social Work 08/15/2018 9:39 AM

## 2018-08-15 NOTE — Progress Notes (Signed)
Patient just came to this writer and was tearful upon approach stating that "I want to kill myself because nobody wants me, it's all Frances's fault". This Probation officer tried to talk to patient about her behavior, however, she stated that "I don't care, I'm tired of trying, they don't care".

## 2018-08-16 LAB — GLUCOSE, CAPILLARY
Glucose-Capillary: 283 mg/dL — ABNORMAL HIGH (ref 70–99)
Glucose-Capillary: 134 mg/dL — ABNORMAL HIGH (ref 70–99)
Glucose-Capillary: 104 mg/dL — ABNORMAL HIGH (ref 70–99)
Glucose-Capillary: 103 mg/dL — ABNORMAL HIGH (ref 70–99)

## 2018-08-16 NOTE — Plan of Care (Signed)
Pt denies depression, anxiety, SI. HI and AVH. Pt was educated on care plan and verbalizes understanding. Collier Bullock RN Problem: Education: Goal: Knowledge of [facility] Education information/materials will improve Outcome: Progressing   Problem: Coping: Goal: Ability to verbalize frustrations and anger appropriately will improve Outcome: Progressing Goal: Ability to demonstrate self-control will improve Outcome: Progressing   Problem: Safety: Goal: Periods of time without injury will increase Outcome: Progressing   Problem: Safety: Goal: Ability to redirect hostility and anger into socially appropriate behaviors will improve Outcome: Progressing   Problem: Activity: Goal: Interest or engagement in leisure activities will improve Outcome: Progressing Goal: Imbalance in normal sleep/wake cycle will improve Outcome: Progressing

## 2018-08-16 NOTE — Progress Notes (Signed)
Recreation Therapy Notes  Date: 08/16/2018  Time: 9:30 am   Location: Craft room   Behavioral response: N/A   Intervention Topic: Problem Solving  Discussion/Intervention: Patient did not attend group.   Clinical Observations/Feedback:  Patient did not attend group.   Antonio Creswell LRT/CTRS        [facility] 08/16/2018 10:57 AM

## 2018-08-16 NOTE — BHH Counselor (Signed)
CSW spoke with Vaughan Basta H.([employer] [phone]) who is following up on exception diversion form that was submitted by this writer on 08/15/18. She suggested  contacting [phone]) to see if they would accept the pt. CSW called and spoke with Cleaster Corin who states the IDD unit and acute unit are at capacity and suggested a referral be made to a state hospital. CSW relayed this information to Bonadelle Ranchos who reports she will finish processing the referral and fax a copy to this Probation officer and [place] today.

## 2018-08-16 NOTE — Plan of Care (Signed)
Patient is calm stable  and cooperating with car of ADLs , takes his medications with out any side effects, denies any SI/HI/AVH and no signs of delusions, patient endorses minor depression at 4/10 scale , mood is good and affect is congruent with mood, education and support is provided , patient voice understanding of information provided , encourage patient voice concerns she may have to the nurse. Patient only requires 15 minutes safety checks no distress.   Problem: Education: Goal: Knowledge of [facility] Education information/materials will improve Outcome: Progressing   Problem: Coping: Goal: Ability to verbalize frustrations and anger appropriately will improve Outcome: Progressing Goal: Ability to demonstrate self-control will improve Outcome: Progressing   Problem: Safety: Goal: Periods of time without injury will increase Outcome: Progressing   Problem: Safety: Goal: Ability to redirect hostility and anger into socially appropriate behaviors will improve Outcome: Progressing   Problem: Activity: Goal: Interest or engagement in leisure activities will improve Outcome: Progressing Goal: Imbalance in normal sleep/wake cycle will improve Outcome: Progressing

## 2018-08-16 NOTE — Progress Notes (Signed)
[facility] MD Progress Note  08/16/2018 6:50 PM Jessica [name]  MRN:  [id number] Subjective: Patient seen chart reviewed.  Patient with a history of schizoaffective disorder and intellectual disability.  Patient apologized to me for having been irritable last night.  She is calm today.  Denies suicidal or homicidal ideation.  Denies wish to harm anyone.  Denies hallucinations.  No physical complaints Principal Problem: Schizoaffective disorder, bipolar type ([place]) Diagnosis: Principal Problem:   Schizoaffective disorder, bipolar type ([place]) Active Problems:   Diabetes mellitus without complication ([place])   Intellectual disability   Agitation   Borderline personality disorder (H[facility])   HTN (hypertension)   Hypothyroidism   Bipolar disorder, unspecified ([place])  Total Time spent with patient: 30 minutes  Past Psychiatric History: Past history of recurrent behavior problems and intellectual disability  Past Medical History:  Past Medical History:  Diagnosis Date  . Anxiety   . Depression   . Diabetes mellitus without complication ([place])   . GERD (gastroesophageal reflux disease)   . Homicidal ideation   . Hypertension   . Hypothyroidism     Past Surgical History:  Procedure Laterality Date  . COLPOSCOPY    . FRACTURE SURGERY    . TRACHEOSTOMY     Family History: History reviewed. No pertinent family history. Family Psychiatric  History: See previous Social History:  Social History   Substance and Sexual Activity  Alcohol Use No  . Frequency: Never     Social History   Substance and Sexual Activity  Drug Use Yes  . Types: Marijuana, "Crack" cocaine   Comment: reports she has not done anything in a long time    Social History   Socioeconomic History  . Marital status: Single    Spouse name: Not on file  . Number of children: Not on file  . Years of education: Not on file  . Highest education level: Not on file  Occupational History  . Not on file  Social Needs  . Financial  resource strain: Somewhat hard  . Food insecurity    Worry: Patient refused    Inability: Patient refused  . Transportation needs    Medical: Patient refused    Non-medical: Patient refused  Tobacco Use  . Smoking status: Former Research scientist (life sciences)  . Smokeless tobacco: Never Used  Substance and Sexual Activity  . Alcohol use: No    Frequency: Never  . Drug use: Yes    Types: Marijuana, "Crack" cocaine    Comment: reports she has not done anything in a long time  . Sexual activity: Not Currently    Birth control/protection: Abstinence  Lifestyle  . Physical activity    Days per week: Patient refused    Minutes per session: Patient refused  . Stress: Rather much  Relationships  . Social Herbalist on phone: Patient refused    Gets together: Patient refused    Attends religious service: Patient refused    Active member of club or organization: Patient refused    Attends meetings of clubs or organizations: Patient refused    Relationship status: Patient refused  Other Topics Concern  . Not on file  Social History Narrative  . Not on file   Additional Social History:                         Sleep: Fair  Appetite:  Fair  Current Medications: Current Facility-Administered Medications  Medication Dose Route Frequency Provider Last Rate Last  Dose  . acetaminophen (TYLENOL) tablet 650 mg  650 mg Oral Q6H PRN Money, Lowry Ram, FNP   650 mg at 08/16/18 1238  . alum & mag hydroxide-simeth (MAALOX/MYLANTA) 200-200-20 MG/5ML suspension 30 mL  30 mL Oral Q4H PRN Money, Lowry Ram, FNP      . atorvastatin (LIPITOR) tablet 10 mg  10 mg Oral q1800 Money, Lowry Ram, FNP   10 mg at 08/16/18 1815  . DULoxetine (CYMBALTA) DR capsule 60 mg  60 mg Oral Daily Sinthia Karabin, Madie Reno, MD   60 mg at 08/16/18 7078  . ferrous sulfate tablet 325 mg  325 mg Oral BID WC Money, Lowry Ram, FNP   325 mg at 08/16/18 1623  . gabapentin (NEURONTIN) capsule 600 mg  600 mg Oral TID Prithvi Kooi, Madie Reno, MD   600 mg  at 08/16/18 1624  . insulin aspart (novoLOG) injection 0-5 Units  0-5 Units Subcutaneous QHS Money, Lowry Ram, FNP   2 Units at 08/09/18 2142  . insulin aspart (novoLOG) injection 0-9 Units  0-9 Units Subcutaneous TID WC Money, Lowry Ram, FNP   1 Units at 08/16/18 1201  . lamoTRIgine (LAMICTAL) tablet 100 mg  100 mg Oral QHS Mabel Roll, Madie Reno, MD   100 mg at 08/15/18 2041  . levothyroxine (SYNTHROID) tablet 150 mcg  150 mcg Oral Q0600 Money, Lowry Ram, FNP   150 mcg at 08/16/18 0542  . loratadine (CLARITIN) tablet 10 mg  10 mg Oral Daily Eleanor Gatliff, Madie Reno, MD   10 mg at 08/16/18 0807  . LORazepam (ATIVAN) tablet 2 mg  2 mg Oral Q4H PRN Jamareon Shimel, Madie Reno, MD   2 mg at 08/08/18 2138   Or  . LORazepam (ATIVAN) injection 2 mg  2 mg Intramuscular Q4H PRN Dorisann Schwanke, Madie Reno, MD   2 mg at 08/15/18 1130  . lurasidone (LATUDA) tablet 80 mg  80 mg Oral BID WC Sharma Covert, MD   80 mg at 08/16/18 1623  . magnesium hydroxide (MILK OF MAGNESIA) suspension 30 mL  30 mL Oral Daily PRN Money, Darnelle Maffucci B, FNP      . metFORMIN (GLUCOPHAGE) tablet 1,000 mg  1,000 mg Oral BID WC Sharma Covert, MD   1,000 mg at 08/16/18 1624  . moxifloxacin (VIGAMOX) 0.5 % ophthalmic solution 1 drop  1 drop Right Eye TID Money, Lowry Ram, FNP   1 drop at 08/16/18 1624  . neomycin-bacitracin-polymyxin (NEOSPORIN) ointment   Topical BID Sharma Covert, MD      . pantoprazole (PROTONIX) EC tablet 40 mg  40 mg Oral Daily Money, Lowry Ram, FNP   40 mg at 08/16/18 6754  . traZODone (DESYREL) tablet 100 mg  100 mg Oral QHS Money, Travis B, FNP   100 mg at 08/15/18 2040  . ziprasidone (GEODON) injection 20 mg  20 mg Intramuscular Q12H PRN Sevrin Sally, Madie Reno, MD   20 mg at 08/15/18 1130    Lab Results:  Results for orders placed or performed during the hospital encounter of 08/04/18 (from the past 48 hour(s))  Glucose, capillary     Status: Abnormal   Collection Time: 08/14/18  9:04 PM  Result Value Ref Range   Glucose-Capillary 191 (H) 70 -  99 mg/dL  Glucose, capillary     Status: Abnormal   Collection Time: 08/15/18  7:02 AM  Result Value Ref Range   Glucose-Capillary 202 (H) 70 - 99 mg/dL   Comment 1 Notify RN   Glucose, capillary  Status: Abnormal   Collection Time: 08/15/18 11:39 AM  Result Value Ref Range   Glucose-Capillary 239 (H) 70 - 99 mg/dL  Glucose, capillary     Status: Abnormal   Collection Time: 08/15/18  4:23 PM  Result Value Ref Range   Glucose-Capillary 121 (H) 70 - 99 mg/dL  Glucose, capillary     Status: None   Collection Time: 08/15/18  8:38 PM  Result Value Ref Range   Glucose-Capillary 98 70 - 99 mg/dL   Comment 1 Notify RN   Glucose, capillary     Status: Abnormal   Collection Time: 08/16/18  7:01 AM  Result Value Ref Range   Glucose-Capillary 283 (H) 70 - 99 mg/dL   Comment 1 Notify RN   Glucose, capillary     Status: Abnormal   Collection Time: 08/16/18 11:06 AM  Result Value Ref Range   Glucose-Capillary 134 (H) 70 - 99 mg/dL   Comment 1 Notify RN   Glucose, capillary     Status: Abnormal   Collection Time: 08/16/18  4:20 PM  Result Value Ref Range   Glucose-Capillary 104 (H) 70 - 99 mg/dL   Comment 1 Notify RN     Blood Alcohol level:  Lab Results  Component Value Date   ETH <10 07/19/2018   ETH <10 71/24/5809    Metabolic Disorder Labs: Lab Results  Component Value Date   HGBA1C 5.3 06/05/2018   MPG 105.41 06/05/2018   MPG 102.54 12/30/2017   No results found for: PROLACTIN Lab Results  Component Value Date   CHOL 155 06/05/2018   TRIG 93 06/05/2018   HDL 54 06/05/2018   CHOLHDL 2.9 06/05/2018   VLDL 19 06/05/2018   LDLCALC 82 06/05/2018   [place] 90 12/30/2017    Physical Findings: AIMS:  , ,  ,  ,    CIWA:    COWS:     Musculoskeletal: Strength & Muscle Tone: within normal limits Gait & Station: normal Patient leans: N/A  Psychiatric Specialty Exam: Physical Exam  Nursing note and vitals reviewed. Constitutional: She appears well-developed and  well-nourished.  HENT:  Head: Normocephalic and atraumatic.  Eyes: Pupils are equal, round, and reactive to light. Conjunctivae are normal.  Neck: Normal range of motion.  Cardiovascular: Regular rhythm and normal heart sounds.  Respiratory: Effort normal.  GI: Soft.  Musculoskeletal: Normal range of motion.  Neurological: She is alert.  Skin: Skin is warm and dry.  Psychiatric: She has a normal mood and affect. Her behavior is normal. Judgment and thought content normal.    Review of Systems  Constitutional: Negative.   HENT: Negative.   Eyes: Negative.   Respiratory: Negative.   Cardiovascular: Negative.   Gastrointestinal: Negative.   Musculoskeletal: Negative.   Skin: Negative.   Neurological: Negative.   Psychiatric/Behavioral: Negative.     Blood pressure 121/73, pulse 80, temperature 98.5 F (36.9 C), temperature source Oral, resp. rate 18, height 4\' 10"  (1.473 m), weight 72.6 kg, SpO2 96 %.Body mass index is 33.44 kg/m.  General Appearance: Casual  Eye Contact:  Fair  Speech:  Clear and Coherent  Volume:  Normal  Mood:  Euthymic  Affect:  Congruent  Thought Process:  Goal Directed  Orientation:  Full (Time, Place, and Person)  Thought Content:  Logical  Suicidal Thoughts:  No  Homicidal Thoughts:  No  Memory:  Immediate;   Fair Recent;   Fair Remote;   Fair  Judgement:  Fair  Insight:  Shallow  Psychomotor  Activity:  Decreased  Concentration:  Concentration: Fair  Recall:  [employer] of Knowledge:  Fair  Language:  Fair  Akathisia:  No  Handed:  Right  AIMS (if indicated):     Assets:  Desire for Improvement  ADL's:  Impaired  Cognition:  Impaired,  Moderate  Sleep:  Number of Hours: 7.5     Treatment Plan Summary: Daily contact with patient to assess and evaluate symptoms and progress in treatment, Medication management and Plan Supportive counseling and therapy.  Reassurance.  No change to medication.  Alethia Berthold, MD 08/16/2018, 6:50 PM

## 2018-08-16 NOTE — [facility] Group Notes (Signed)
LCSW Group Therapy Note  08/16/2018 1:00 PM  Type of Therapy/Topic:  Group Therapy:  Feelings about Diagnosis  Participation Level:  Active   Description of Group:   This group will allow patients to explore their thoughts and feelings about diagnoses they have received. Patients will be guided to explore their level of understanding and acceptance of these diagnoses. Facilitator will encourage patients to process their thoughts and feelings about the reactions of others to their diagnosis and will guide patients in identifying ways to discuss their diagnosis with significant others in their lives. This group will be process-oriented, with patients participating in exploration of their own experiences, giving and receiving support, and processing challenge from other group members.   Therapeutic Goals: 1. Patient will demonstrate understanding of diagnosis as evidenced by identifying two or more symptoms of the disorder 2. Patient will be able to express two feelings regarding the diagnosis 3. Patient will demonstrate their ability to communicate their needs through discussion and/or role play  Summary of Patient Progress: Patient was present in group.  Patient was active in group discussions.  Patient was supportive of other group members. Patient shared how depression has been an emotion that she has experienced surrounding her diagnosis.  She shared how art, reading her Bible and music has been a coping skill of hers.   Therapeutic Modalities:   Cognitive Behavioral Therapy Brief Therapy Feelings Identification   Assunta Curtis, MSW, LCSW 08/16/2018 2:50 PM

## 2018-08-16 NOTE — Progress Notes (Signed)
Pt was calm and cooperative today. She was very pleasant. Collier Bullock RN

## 2018-08-16 NOTE — Progress Notes (Signed)
Pt met with [facility] for potential group home placement. CSW observed while pt had her interview.  CSW informed Dwain of pts history of aggression and behaviors. Dwain reported he will have his QP contact CSW to request more information and he may come back to meet with pt again.   Evalina Field, MSW, LCSW Clinical Social Work 08/16/2018 2:45 PM

## 2018-08-17 LAB — GLUCOSE, CAPILLARY
Glucose-Capillary: 233 mg/dL — ABNORMAL HIGH (ref 70–99)
Glucose-Capillary: 157 mg/dL — ABNORMAL HIGH (ref 70–99)
Glucose-Capillary: 96 mg/dL (ref 70–99)
Glucose-Capillary: 88 mg/dL (ref 70–99)

## 2018-08-17 MED ORDER — HYDROXYZINE HCL 50 MG PO TABS
50.0000 mg | ORAL_TABLET | Freq: Four times a day (QID) | ORAL | Status: DC | PRN
  Administered 2018-08-20 – 2018-09-19 (×4): 50 mg via ORAL
  Filled 2018-08-17 (×4): qty 1

## 2018-08-17 NOTE — Progress Notes (Signed)
D: Patient became upset about something another female patient said to her. She got loud and threatened to kill the other patient. The two patients were separated, and then patient was redirected away from going toward the room of the patient she had threatened, saying "I'm gonna get her". She initially refused her oral medication and CBG. After receiving Ativan IM and Geodon IM per prn order, which she accepted. At first she said she was not going to take a shot, and then said "Put one in one arm and one in the other". After receiving her IM injections, she asked for her oral medications and asked to have her CBG done. She expressed remorse and some concern over having "messed things up" for the new group home she hopes to go to. Calmed down. Received snack and went to bed A: Continue to monitor for safety R: Safety maintained.

## 2018-08-17 NOTE — Plan of Care (Signed)
  Problem: Coping: Goal: Ability to verbalize frustrations and anger appropriately will improve Outcome: Progressing Goal: Ability to demonstrate self-control will improve Outcome: Progressing   D: Patient became upset about something another female patient said to her. She got loud and threatened to kill the other patient. The two patients were separated, and then patient was redirected away from going toward the room of the patient she had threatened, saying "I'm gonna get her". She initially refused her oral medication and CBG. After receiving Ativan IM and Geodon IM per prn order, which she accepted. At first she said she was not going to take a shot, and then said "Put one in one arm and one in the other". After receiving her IM injections, she asked for her oral medications and asked to have her CBG done. She expressed remorse and some concern over having "messed things up" for the new group home she hopes to go to. Calmed down. Received snack and went to bed A: Continue to monitor for safety R: Safety maintained.

## 2018-08-17 NOTE — Progress Notes (Signed)
[facility] MD Progress Note  08/17/2018 5:43 PM Jessica [name]  MRN:  [id number] Subjective: Follow-up for patient with developmental disability and behavior disorder.  Patient has still had occasional outbursts of agitation but has not been violent recently.  She has been cooperative with treatment when the situation arises.  On interview today she was appropriate making appropriate eye contact she was lucid able to show some good insight.  Not aggressive or causing any trouble. Principal Problem: Schizoaffective disorder, bipolar type ([place]) Diagnosis: Principal Problem:   Schizoaffective disorder, bipolar type ([facility]) Active Problems:   Diabetes mellitus without complication ([place])   Intellectual disability   Agitation   Borderline personality disorder (HCC)   HTN (hypertension)   Hypothyroidism   Bipolar disorder, unspecified ([place])  Total Time spent with patient: 30 minutes  Past Psychiatric History: Patient has a long history of behavior disorder developmental disability  Past Medical History:  Past Medical History:  Diagnosis Date  . Anxiety   . Depression   . Diabetes mellitus without complication ([place])   . GERD (gastroesophageal reflux disease)   . Homicidal ideation   . Hypertension   . Hypothyroidism     Past Surgical History:  Procedure Laterality Date  . COLPOSCOPY    . FRACTURE SURGERY    . TRACHEOSTOMY     Family History: History reviewed. No pertinent family history. Family Psychiatric  History: See previous Social History:  Social History   Substance and Sexual Activity  Alcohol Use No  . Frequency: Never     Social History   Substance and Sexual Activity  Drug Use Yes  . Types: Marijuana, "Crack" cocaine   Comment: reports she has not done anything in a long time    Social History   Socioeconomic History  . Marital status: Single    Spouse name: Not on file  . Number of children: Not on file  . Years of education: Not on file  . Highest education  level: Not on file  Occupational History  . Not on file  Social Needs  . Financial resource strain: Somewhat hard  . Food insecurity    Worry: Patient refused    Inability: Patient refused  . Transportation needs    Medical: Patient refused    Non-medical: Patient refused  Tobacco Use  . Smoking status: Former Research scientist (life sciences)  . Smokeless tobacco: Never Used  Substance and Sexual Activity  . Alcohol use: No    Frequency: Never  . Drug use: Yes    Types: Marijuana, "Crack" cocaine    Comment: reports she has not done anything in a long time  . Sexual activity: Not Currently    Birth control/protection: Abstinence  Lifestyle  . Physical activity    Days per week: Patient refused    Minutes per session: Patient refused  . Stress: Rather much  Relationships  . Social Herbalist on phone: Patient refused    Gets together: Patient refused    Attends religious service: Patient refused    Active member of club or organization: Patient refused    Attends meetings of clubs or organizations: Patient refused    Relationship status: Patient refused  Other Topics Concern  . Not on file  Social History Narrative  . Not on file   Additional Social History:                         Sleep: Fair  Appetite:  Fair  Current  Medications: Current Facility-Administered Medications  Medication Dose Route Frequency Provider Last Rate Last Dose  . acetaminophen (TYLENOL) tablet 650 mg  650 mg Oral Q6H PRN Money, Lowry Ram, FNP   650 mg at 08/16/18 1238  . alum & mag hydroxide-simeth (MAALOX/MYLANTA) 200-200-20 MG/5ML suspension 30 mL  30 mL Oral Q4H PRN Money, Lowry Ram, FNP      . atorvastatin (LIPITOR) tablet 10 mg  10 mg Oral q1800 Money, Lowry Ram, FNP   10 mg at 08/17/18 1705  . DULoxetine (CYMBALTA) DR capsule 60 mg  60 mg Oral Daily Clapacs, Madie Reno, MD   60 mg at 08/17/18 0744  . ferrous sulfate tablet 325 mg  325 mg Oral BID WC Money, Lowry Ram, FNP   325 mg at 08/17/18 1702   . gabapentin (NEURONTIN) capsule 600 mg  600 mg Oral TID Clapacs, John T, MD   600 mg at 08/17/18 1702  . hydrOXYzine (ATARAX/VISTARIL) tablet 50 mg  50 mg Oral Q6H PRN Clapacs, John T, MD      . insulin aspart (novoLOG) injection 0-5 Units  0-5 Units Subcutaneous QHS Money, Lowry Ram, FNP   2 Units at 08/09/18 2142  . insulin aspart (novoLOG) injection 0-9 Units  0-9 Units Subcutaneous TID WC Money, Lowry Ram, FNP   2 Units at 08/17/18 1150  . lamoTRIgine (LAMICTAL) tablet 100 mg  100 mg Oral QHS Clapacs, Madie Reno, MD   100 mg at 08/16/18 2108  . levothyroxine (SYNTHROID) tablet 150 mcg  150 mcg Oral Q0600 Money, Lowry Ram, FNP   150 mcg at 08/17/18 9518  . loratadine (CLARITIN) tablet 10 mg  10 mg Oral Daily Clapacs, Madie Reno, MD   10 mg at 08/17/18 0744  . LORazepam (ATIVAN) tablet 2 mg  2 mg Oral Q4H PRN Clapacs, Madie Reno, MD   2 mg at 08/08/18 2138   Or  . LORazepam (ATIVAN) injection 2 mg  2 mg Intramuscular Q4H PRN Clapacs, Madie Reno, MD   2 mg at 08/16/18 2106  . lurasidone (LATUDA) tablet 80 mg  80 mg Oral BID WC Sharma Covert, MD   80 mg at 08/17/18 1702  . magnesium hydroxide (MILK OF MAGNESIA) suspension 30 mL  30 mL Oral Daily PRN Money, Darnelle Maffucci B, FNP      . metFORMIN (GLUCOPHAGE) tablet 1,000 mg  1,000 mg Oral BID WC Sharma Covert, MD   1,000 mg at 08/17/18 1702  . moxifloxacin (VIGAMOX) 0.5 % ophthalmic solution 1 drop  1 drop Right Eye TID Money, Lowry Ram, FNP   1 drop at 08/17/18 1702  . neomycin-bacitracin-polymyxin (NEOSPORIN) ointment   Topical BID Sharma Covert, MD      . pantoprazole (PROTONIX) EC tablet 40 mg  40 mg Oral Daily Money, Lowry Ram, FNP   40 mg at 08/17/18 0744  . traZODone (DESYREL) tablet 100 mg  100 mg Oral QHS Money, Travis B, FNP   100 mg at 08/16/18 2108  . ziprasidone (GEODON) injection 20 mg  20 mg Intramuscular Q12H PRN Clapacs, Madie Reno, MD   20 mg at 08/16/18 2107    Lab Results:  Results for orders placed or performed during the hospital encounter  of 08/04/18 (from the past 48 hour(s))  Glucose, capillary     Status: None   Collection Time: 08/15/18  8:38 PM  Result Value Ref Range   Glucose-Capillary 98 70 - 99 mg/dL   Comment 1 Notify RN   Glucose, capillary  Status: Abnormal   Collection Time: 08/16/18  7:01 AM  Result Value Ref Range   Glucose-Capillary 283 (H) 70 - 99 mg/dL   Comment 1 Notify RN   Glucose, capillary     Status: Abnormal   Collection Time: 08/16/18 11:06 AM  Result Value Ref Range   Glucose-Capillary 134 (H) 70 - 99 mg/dL   Comment 1 Notify RN   Glucose, capillary     Status: Abnormal   Collection Time: 08/16/18  4:20 PM  Result Value Ref Range   Glucose-Capillary 104 (H) 70 - 99 mg/dL   Comment 1 Notify RN   Glucose, capillary     Status: Abnormal   Collection Time: 08/16/18  9:27 PM  Result Value Ref Range   Glucose-Capillary 103 (H) 70 - 99 mg/dL  Glucose, capillary     Status: Abnormal   Collection Time: 08/17/18  7:33 AM  Result Value Ref Range   Glucose-Capillary 233 (H) 70 - 99 mg/dL  Glucose, capillary     Status: Abnormal   Collection Time: 08/17/18 11:35 AM  Result Value Ref Range   Glucose-Capillary 157 (H) 70 - 99 mg/dL  Glucose, capillary     Status: None   Collection Time: 08/17/18  4:15 PM  Result Value Ref Range   Glucose-Capillary 96 70 - 99 mg/dL   Comment 1 Notify RN     Blood Alcohol level:  Lab Results  Component Value Date   ETH <10 07/19/2018   ETH <10 62/26/3335    Metabolic Disorder Labs: Lab Results  Component Value Date   HGBA1C 5.3 06/05/2018   MPG 105.41 06/05/2018   MPG 102.54 12/30/2017   No results found for: PROLACTIN Lab Results  Component Value Date   CHOL 155 06/05/2018   TRIG 93 06/05/2018   HDL 54 06/05/2018   CHOLHDL 2.9 06/05/2018   VLDL 19 06/05/2018   LDLCALC 82 06/05/2018   [place] 90 12/30/2017    Physical Findings: AIMS:  , ,  ,  ,    CIWA:    COWS:     Musculoskeletal: Strength & Muscle Tone: within normal limits Gait  & Station: normal Patient leans: N/A  Psychiatric Specialty Exam: Physical Exam  Nursing note and vitals reviewed. Constitutional: She appears well-developed and well-nourished.  HENT:  Head: Normocephalic and atraumatic.  Eyes: Pupils are equal, round, and reactive to light. Conjunctivae are normal.  Neck: Normal range of motion.  Cardiovascular: Regular rhythm and normal heart sounds.  Respiratory: Effort normal. No respiratory distress.  GI: Soft.  Musculoskeletal: Normal range of motion.  Neurological: She is alert.  Skin: Skin is warm and dry.  Psychiatric: Her affect is blunt. Her speech is delayed. She is slowed and withdrawn. Cognition and memory are impaired. She expresses impulsivity. She expresses no homicidal and no suicidal ideation.    Review of Systems  Constitutional: Negative.   HENT: Negative.   Eyes: Negative.   Respiratory: Negative.   Cardiovascular: Negative.   Gastrointestinal: Negative.   Musculoskeletal: Negative.   Skin: Negative.   Neurological: Negative.   Psychiatric/Behavioral: Negative for depression, hallucinations, substance abuse and suicidal ideas. The patient is nervous/anxious.     Blood pressure 123/65, pulse 89, temperature 99 F (37.2 C), temperature source Oral, resp. rate 16, height 4\' 10"  (1.473 m), weight 72.6 kg, SpO2 96 %.Body mass index is 33.44 kg/m.  General Appearance: Casual  Eye Contact:  Fair  Speech:  Slow  Volume:  Decreased  Mood:  Euthymic  Affect:  Constricted  Thought Process:  Coherent  Orientation:  Full (Time, Place, and Person)  Thought Content:  Rumination  Suicidal Thoughts:  No  Homicidal Thoughts:  No  Memory:  Immediate;   Fair Recent;   Poor Remote;   Fair  Judgement:  Fair  Insight:  Fair  Psychomotor Activity:  Decreased  Concentration:  Concentration: Poor  Recall:  [place] of Knowledge:  Poor  Language:  Fair  Akathisia:  No  Handed:  Right  AIMS (if indicated):     Assets:  Desire  for Improvement Physical Health  ADL's:  Impaired  Cognition:  Impaired,  Mild  Sleep:  Number of Hours: 7.75     Treatment Plan Summary: Daily contact with patient to assess and evaluate symptoms and progress in treatment, Medication management and Plan Reviewed situation with nursing.  We will add Vistaril as an alternative as needed for anxiety so that she does not overuse the Ativan.  Psychoeducation and supportive therapy and encouragement for the patient.  No other change to medication.  Treatment team continuing to work on discharge [place], MD 08/17/2018, 5:43 PM

## 2018-08-17 NOTE — Plan of Care (Signed)
Patient unable to process all  information  received. Staff gave information in concrete  formate .   Continue to  work on anger behavior and  frustration. Staff continue to guard  and monitor  behavior  around staff and peers . Voice no concerns  around wake or sleep cycle.     Problem: Education: Goal: Knowledge of [facility] Education information/materials will improve 08/17/2018 1557 by Leodis Liverpool, RN Outcome: Progressing 08/17/2018 1235 by Leodis Liverpool, RN Outcome: Progressing 08/17/2018 1159 by Leodis Liverpool, RN Outcome: Progressing   Problem: Coping: Goal: Ability to verbalize frustrations and anger appropriately will improve 08/17/2018 1557 by Leodis Liverpool, RN Outcome: Progressing 08/17/2018 1235 by Leodis Liverpool, RN Outcome: Progressing 08/17/2018 1159 by Leodis Liverpool, RN Outcome: Progressing Goal: Ability to demonstrate self-control will improve 08/17/2018 1557 by Leodis Liverpool, RN Outcome: Progressing 08/17/2018 1235 by Leodis Liverpool, RN Outcome: Progressing 08/17/2018 1159 by Leodis Liverpool, RN Outcome: Progressing   Problem: Safety: Goal: Periods of time without injury will increase 08/17/2018 1557 by Leodis Liverpool, RN Outcome: Progressing 08/17/2018 1235 by Leodis Liverpool, RN Outcome: Progressing 08/17/2018 1159 by Leodis Liverpool, RN Outcome: Progressing   Problem: Safety: Goal: Ability to redirect hostility and anger into socially appropriate behaviors will improve 08/17/2018 1557 by Leodis Liverpool, RN Outcome: Progressing 08/17/2018 1235 by Leodis Liverpool, RN Outcome: Progressing 08/17/2018 1159 by Leodis Liverpool, RN Outcome: Progressing   Problem: Activity: Goal: Interest or engagement in leisure activities will improve 08/17/2018 1557 by Leodis Liverpool, RN Outcome: Progressing 08/17/2018 1235 by Leodis Liverpool, RN Outcome: Progressing 08/17/2018 1159 by Leodis Liverpool, RN Outcome: Progressing Goal: Imbalance in normal  sleep/wake cycle will improve 08/17/2018 1557 by Leodis Liverpool, RN Outcome: Progressing 08/17/2018 1235 by Leodis Liverpool, RN Outcome: Progressing 08/17/2018 1159 by Leodis Liverpool, RN Outcome: Progressing

## 2018-08-17 NOTE — Progress Notes (Signed)
D: Patient stated slept good last night .Stated appetite is good and energy level  Is normal. Stated concentration is good . Stated no Depression, hopeless or anxiety Denies suicidal  homicidal ideations  .  No auditory hallucinations  No pain concerns . Appropriate ADL'S. Interacting with peers and staff.  Patient Remains unable to process all  information  received. Staff gave information in concrete  formate .   Continue to  work on anger behavior and  frustration. No anger  Staff continue to guard  and monitor  behavior  around staff and peers . Voice no concerns  around wake or sleep cycle . Compliant with medication   Upset  this am stated to writer she was not going to let anyone  Let her miss going to a group home . Patient very needy at nursing station frequently .   A: Encourage patient participation with unit programming . Instruction  Given on  Medication , verbalize understanding.  R: Voice no other concerns. Staff continue to monitor
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
## 2018-08-17 NOTE — Progress Notes (Signed)
Patient refused her medication this morning and is refusing vital signs

## 2018-08-17 NOTE — Plan of Care (Signed)
Patient Remains unable to process all  information  received. Staff gave information in concrete  formate . Continue to  work on anger behavior and  frustration. Staff continue to guard  and monitor  behavior  around staff and peers . Voice no concerns  around wake or sleep cycle . Compliant with medication     Problem: Education: Goal: Knowledge of [facility] Education information/materials will improve 08/17/2018 1235 by Leodis Liverpool, RN Outcome: Progressing 08/17/2018 1159 by Leodis Liverpool, RN Outcome: Progressing   Problem: Coping: Goal: Ability to verbalize frustrations and anger appropriately will improve 08/17/2018 1235 by Leodis Liverpool, RN Outcome: Progressing 08/17/2018 1159 by Leodis Liverpool, RN Outcome: Progressing Goal: Ability to demonstrate self-control will improve 08/17/2018 1235 by Leodis Liverpool, RN Outcome: Progressing 08/17/2018 1159 by Leodis Liverpool, RN Outcome: Progressing   Problem: Safety: Goal: Periods of time without injury will increase 08/17/2018 1235 by Leodis Liverpool, RN Outcome: Progressing 08/17/2018 1159 by Leodis Liverpool, RN Outcome: Progressing   Problem: Safety: Goal: Ability to redirect hostility and anger into socially appropriate behaviors will improve 08/17/2018 1235 by Leodis Liverpool, RN Outcome: Progressing 08/17/2018 1159 by Leodis Liverpool, RN Outcome: Progressing   Problem: Activity: Goal: Interest or engagement in leisure activities will improve 08/17/2018 1235 by Leodis Liverpool, RN Outcome: Progressing 08/17/2018 1159 by Leodis Liverpool, RN Outcome: Progressing Goal: Imbalance in normal sleep/wake cycle will improve 08/17/2018 1235 by Leodis Liverpool, RN Outcome: Progressing 08/17/2018 1159 by Leodis Liverpool, RN Outcome: Progressing

## 2018-08-17 NOTE — BHH Counselor (Signed)
CSW informed by legal guardian(Vonda Marcello Moores) she has contacted 14 group homes/[facility] in which most of them have stated the patient is not appropriate for the setting due to her displaying aggressive behavior. CSW had to re-fax referral and supporting documents to [facility] due to them stating the referral timed out. CSW spoke with Pamala Hurry in screening and admission who says she will review the documents and a decision will be made on whether or not the pt will be placed on the waitlist. CSW suggested to [facility] placing the pt on the high priority list due to pt aggressive behavior, awaiting decision.

## 2018-08-18 LAB — GLUCOSE, CAPILLARY
Glucose-Capillary: 183 mg/dL — ABNORMAL HIGH (ref 70–99)
Glucose-Capillary: 102 mg/dL — ABNORMAL HIGH (ref 70–99)
Glucose-Capillary: 100 mg/dL — ABNORMAL HIGH (ref 70–99)
Glucose-Capillary: 90 mg/dL (ref 70–99)
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
## 2018-08-18 NOTE — Progress Notes (Signed)
Pt has been calm , cooperative and pleasant. She has attended all the groups. Collier Bullock RN

## 2018-08-18 NOTE — Progress Notes (Signed)
Recreation Therapy Notes  Date: 08/18/2018  Time: 9:30 am  Location: Craft room  Behavioral response: Appropriate   Intervention Topic: Self-care  Discussion/Intervention:  Group content today was focused on Self-Care. The group defined self-care and some positive ways they care for themselves. Individuals expressed ways and reasons why they neglected any self-care in the past. Patients described ways to improve self-care in the future. The group explained what could happen if they did not do any self-care activities at all. The group participated in the intervention "self-care assessment" where they had a chance to discover some of their weaknesses and strengths in self- care. Patient came up with a self-care plan to improve themselves in the future.  Clinical Observations/Feedback:  Patient came to group and defined self-care as taking care of her body. She explained that she participates in self care by stretching. Participant stated that she could improve her self- care by eating better and finding a group home. Individual was social with peers and staff while participating in the intervention.  Jessica [name] LRT/CTRS         Jessica [name] 08/18/2018 10:54 AM

## 2018-08-18 NOTE — Progress Notes (Signed)
Patient alert and oriented x 3 with periods of confusion to situation, no distress noted, thoughts are disorganized and incoherent, she appears restless, disruptive, using profanities and verbally aggressive to staff. Patient was frequently redirected for safety, medicated per MD standing order with Geodon and Lorazepam IM, and she was complaint with medication which had good effect; patient was calmer. Patient continues to be on 1:1 no distress noted, will continue to monitor.

## 2018-08-18 NOTE — Plan of Care (Signed)
Pt denies depression, anxiety, SI, HI and AVH. Pt was educated on care plan and verbalizes understanding. Collier Bullock RN Problem: Education: Goal: Knowledge of [facility] Education information/materials will improve Outcome: Progressing   Problem: Coping: Goal: Ability to verbalize frustrations and anger appropriately will improve Outcome: Progressing Goal: Ability to demonstrate self-control will improve Outcome: Progressing   Problem: Safety: Goal: Periods of time without injury will increase Outcome: Progressing   Problem: Safety: Goal: Ability to redirect hostility and anger into socially appropriate behaviors will improve Outcome: Progressing   Problem: Activity: Goal: Interest or engagement in leisure activities will improve Outcome: Progressing Goal: Imbalance in normal sleep/wake cycle will improve Outcome: Progressing

## 2018-08-18 NOTE — Progress Notes (Signed)
[facility] MD Progress Note motion to have  08/18/2018 5:24 PM Jessica [name]  MRN:  [id number] Subjective: Follow-up patient with schizoaffective disorder.  No new complaints.  Likes to talk about how she is working on controlling her anger.  No physical complaints.  Tolerating medicine well. Principal Problem: Schizoaffective disorder, bipolar type ([place]) Diagnosis: Principal Problem:   Schizoaffective disorder, bipolar type ([place]) Active Problems:   Diabetes mellitus without complication ([place])   Intellectual disability   Agitation   Borderline personality disorder (HCC)   HTN (hypertension)   Hypothyroidism   Bipolar disorder, unspecified ([place])  Total Time spent with patient: 20 minutes  Past Psychiatric History: History of recurrent behavior problems.  Chronic developmental disability  Past Medical History:  Past Medical History:  Diagnosis Date  . Anxiety   . Depression   . Diabetes mellitus without complication ([place])   . GERD (gastroesophageal reflux disease)   . Homicidal ideation   . Hypertension   . Hypothyroidism     Past Surgical History:  Procedure Laterality Date  . COLPOSCOPY    . FRACTURE SURGERY    . TRACHEOSTOMY     Family History: History reviewed. No pertinent family history. Family Psychiatric  History: See previous Social History:  Social History   Substance and Sexual Activity  Alcohol Use No  . Frequency: Never     Social History   Substance and Sexual Activity  Drug Use Yes  . Types: Marijuana, "Crack" cocaine   Comment: reports she has not done anything in a long time    Social History   Socioeconomic History  . Marital status: Single    Spouse name: Not on file  . Number of children: Not on file  . Years of education: Not on file  . Highest education level: Not on file  Occupational History  . Not on file  Social Needs  . Financial resource strain: Somewhat hard  . Food insecurity    Worry: Patient refused    Inability: Patient  refused  . Transportation needs    Medical: Patient refused    Non-medical: Patient refused  Tobacco Use  . Smoking status: Former Research scientist (life sciences)  . Smokeless tobacco: Never Used  Substance and Sexual Activity  . Alcohol use: No    Frequency: Never  . Drug use: Yes    Types: Marijuana, "Crack" cocaine    Comment: reports she has not done anything in a long time  . Sexual activity: Not Currently    Birth control/protection: Abstinence  Lifestyle  . Physical activity    Days per week: Patient refused    Minutes per session: Patient refused  . Stress: Rather much  Relationships  . Social Herbalist on phone: Patient refused    Gets together: Patient refused    Attends religious service: Patient refused    Active member of club or organization: Patient refused    Attends meetings of clubs or organizations: Patient refused    Relationship status: Patient refused  Other Topics Concern  . Not on file  Social History Narrative  . Not on file   Additional Social History:                         Sleep: Fair  Appetite:  Fair  Current Medications: Current Facility-Administered Medications  Medication Dose Route Frequency Provider Last Rate Last Dose  . acetaminophen (TYLENOL) tablet 650 mg  650 mg Oral Q6H PRN Money, Lowry Ram,  FNP   650 mg at 08/16/18 1238  . alum & mag hydroxide-simeth (MAALOX/MYLANTA) 200-200-20 MG/5ML suspension 30 mL  30 mL Oral Q4H PRN Money, Lowry Ram, FNP      . atorvastatin (LIPITOR) tablet 10 mg  10 mg Oral q1800 Money, Lowry Ram, FNP   10 mg at 08/18/18 1710  . DULoxetine (CYMBALTA) DR capsule 60 mg  60 mg Oral Daily Clapacs, Madie Reno, MD   60 mg at 08/18/18 0752  . ferrous sulfate tablet 325 mg  325 mg Oral BID WC Money, Lowry Ram, FNP   325 mg at 08/18/18 1709  . gabapentin (NEURONTIN) capsule 600 mg  600 mg Oral TID Clapacs, John T, MD   600 mg at 08/18/18 1709  . hydrOXYzine (ATARAX/VISTARIL) tablet 50 mg  50 mg Oral Q6H PRN Clapacs, John T,  MD      . insulin aspart (novoLOG) injection 0-5 Units  0-5 Units Subcutaneous QHS Money, Lowry Ram, FNP   2 Units at 08/09/18 2142  . insulin aspart (novoLOG) injection 0-9 Units  0-9 Units Subcutaneous TID WC Money, Lowry Ram, FNP   2 Units at 08/18/18 0755  . lamoTRIgine (LAMICTAL) tablet 100 mg  100 mg Oral QHS Clapacs, John T, MD   100 mg at 08/18/18 0032  . levothyroxine (SYNTHROID) tablet 150 mcg  150 mcg Oral Q0600 Money, Lowry Ram, FNP   150 mcg at 08/18/18 8099  . loratadine (CLARITIN) tablet 10 mg  10 mg Oral Daily Clapacs, Madie Reno, MD   10 mg at 08/18/18 0753  . LORazepam (ATIVAN) tablet 2 mg  2 mg Oral Q4H PRN Clapacs, Madie Reno, MD   2 mg at 08/08/18 2138   Or  . LORazepam (ATIVAN) injection 2 mg  2 mg Intramuscular Q4H PRN Clapacs, Madie Reno, MD   2 mg at 08/16/18 2106  . lurasidone (LATUDA) tablet 80 mg  80 mg Oral BID WC Sharma Covert, MD   80 mg at 08/18/18 1709  . magnesium hydroxide (MILK OF MAGNESIA) suspension 30 mL  30 mL Oral Daily PRN Money, Darnelle Maffucci B, FNP      . metFORMIN (GLUCOPHAGE) tablet 1,000 mg  1,000 mg Oral BID WC Sharma Covert, MD   1,000 mg at 08/18/18 1708  . moxifloxacin (VIGAMOX) 0.5 % ophthalmic solution 1 drop  1 drop Right Eye TID Money, Lowry Ram, FNP   1 drop at 08/18/18 1710  . neomycin-bacitracin-polymyxin (NEOSPORIN) ointment   Topical BID Sharma Covert, MD      . pantoprazole (PROTONIX) EC tablet 40 mg  40 mg Oral Daily Money, Lowry Ram, FNP   40 mg at 08/18/18 8338  . traZODone (DESYREL) tablet 100 mg  100 mg Oral QHS Money, Travis B, FNP   100 mg at 08/18/18 0032  . ziprasidone (GEODON) injection 20 mg  20 mg Intramuscular Q12H PRN Clapacs, Madie Reno, MD   20 mg at 08/16/18 2107    Lab Results:  Results for orders placed or performed during the hospital encounter of 08/04/18 (from the past 48 hour(s))  Glucose, capillary     Status: Abnormal   Collection Time: 08/16/18  9:27 PM  Result Value Ref Range   Glucose-Capillary 103 (H) 70 - 99 mg/dL   Glucose, capillary     Status: Abnormal   Collection Time: 08/17/18  7:33 AM  Result Value Ref Range   Glucose-Capillary 233 (H) 70 - 99 mg/dL  Glucose, capillary     Status:  Abnormal   Collection Time: 08/17/18 11:35 AM  Result Value Ref Range   Glucose-Capillary 157 (H) 70 - 99 mg/dL  Glucose, capillary     Status: None   Collection Time: 08/17/18  4:15 PM  Result Value Ref Range   Glucose-Capillary 96 70 - 99 mg/dL   Comment 1 Notify RN   Glucose, capillary     Status: None   Collection Time: 08/17/18  8:41 PM  Result Value Ref Range   Glucose-Capillary 88 70 - 99 mg/dL   Comment 1 Notify RN   Glucose, capillary     Status: Abnormal   Collection Time: 08/18/18  7:10 AM  Result Value Ref Range   Glucose-Capillary 183 (H) 70 - 99 mg/dL   Comment 1 Notify RN   Glucose, capillary     Status: Abnormal   Collection Time: 08/18/18 11:37 AM  Result Value Ref Range   Glucose-Capillary 102 (H) 70 - 99 mg/dL  Glucose, capillary     Status: Abnormal   Collection Time: 08/18/18  4:19 PM  Result Value Ref Range   Glucose-Capillary 100 (H) 70 - 99 mg/dL    Blood Alcohol level:  Lab Results  Component Value Date   ETH <10 07/19/2018   ETH <10 90/24/0973    Metabolic Disorder Labs: Lab Results  Component Value Date   HGBA1C 5.3 06/05/2018   MPG 105.41 06/05/2018   MPG 102.54 12/30/2017   No results found for: PROLACTIN Lab Results  Component Value Date   CHOL 155 06/05/2018   TRIG 93 06/05/2018   HDL 54 06/05/2018   CHOLHDL 2.9 06/05/2018   VLDL 19 06/05/2018   LDLCALC 82 06/05/2018   [place] 90 12/30/2017    Physical Findings: AIMS:  , ,  ,  ,    CIWA:    COWS:     Musculoskeletal: Strength & Muscle Tone: within normal limits Gait & Station: normal Patient leans: N/A  Psychiatric Specialty Exam: Physical Exam  Nursing note and vitals reviewed. Constitutional: She appears well-developed and well-nourished.  HENT:  Head: Normocephalic and atraumatic.   Eyes: Pupils are equal, round, and reactive to light. Conjunctivae are normal.  Neck: Normal range of motion.  Cardiovascular: Regular rhythm and normal heart sounds.  Respiratory: Effort normal. No respiratory distress.  GI: Soft.  Musculoskeletal: Normal range of motion.  Neurological: She is alert.  Skin: Skin is warm and dry.  Psychiatric: She has a normal mood and affect. Her behavior is normal. Judgment and thought content normal.    Review of Systems  Constitutional: Negative.   HENT: Negative.   Eyes: Negative.   Respiratory: Negative.   Cardiovascular: Negative.   Gastrointestinal: Negative.   Musculoskeletal: Negative.   Skin: Negative.   Neurological: Negative.   Psychiatric/Behavioral: Negative.     Blood pressure 121/73, pulse 75, temperature 97.8 F (36.6 C), temperature source Oral, resp. rate 18, height 4\' 10"  (1.473 m), weight 72.6 kg, SpO2 97 %.Body mass index is 33.44 kg/m.  General Appearance: Casual  Eye Contact:  Fair  Speech:  Clear and Coherent  Volume:  Normal  Mood:  Euthymic  Affect:  Congruent  Thought Process:  Coherent  Orientation:  Full (Time, Place, and Person)  Thought Content:  Logical  Suicidal Thoughts:  No  Homicidal Thoughts:  No  Memory:  Immediate;   Fair Recent;   Fair Remote;   Fair  Judgement:  Fair  Insight:  Fair  Psychomotor Activity:  Decreased  Concentration:  Concentration:  Fair  Recall:  [employer] of Knowledge:  Fair  Language:  Fair  Akathisia:  No  Handed:  Right  AIMS (if indicated):     Assets:  Desire for Improvement  ADL's:  Intact  Cognition:  Impaired,  Mild  Sleep:  Number of Hours: 7     Treatment Plan Summary: Daily contact with patient to assess and evaluate symptoms and progress in treatment, Medication management and Plan No change to medication.  Continue working on discharge planning  Alethia Berthold, MD 08/18/2018, 5:24 PM

## 2018-08-18 NOTE — Tx Team (Signed)
Interdisciplinary Treatment and Diagnostic Plan Update  08/18/2018 Time of Session: 8:30am Jessica [name] MRN: [id number]  Principal Diagnosis: Schizoaffective disorder, bipolar type ([place])  Secondary Diagnoses: Principal Problem:   Schizoaffective disorder, bipolar type ([place]) Active Problems:   Diabetes mellitus without complication ([place])   Intellectual disability   Agitation   Borderline personality disorder ([place])   HTN (hypertension)   Hypothyroidism   Bipolar disorder, unspecified ([place])   Current Medications:  Current Facility-Administered Medications  Medication Dose Route Frequency Provider Last Rate Last Dose  . acetaminophen (TYLENOL) tablet 650 mg  650 mg Oral Q6H PRN Money, Lowry Ram, FNP   650 mg at 08/16/18 1238  . alum & mag hydroxide-simeth (MAALOX/MYLANTA) 200-200-20 MG/5ML suspension 30 mL  30 mL Oral Q4H PRN Money, Lowry Ram, FNP      . atorvastatin (LIPITOR) tablet 10 mg  10 mg Oral q1800 Money, Lowry Ram, FNP   10 mg at 08/17/18 1705  . DULoxetine (CYMBALTA) DR capsule 60 mg  60 mg Oral Daily Clapacs, Madie Reno, MD   60 mg at 08/18/18 0752  . ferrous sulfate tablet 325 mg  325 mg Oral BID WC Money, Lowry Ram, FNP   325 mg at 08/18/18 1443  . gabapentin (NEURONTIN) capsule 600 mg  600 mg Oral TID Clapacs, John T, MD   600 mg at 08/18/18 1540  . hydrOXYzine (ATARAX/VISTARIL) tablet 50 mg  50 mg Oral Q6H PRN Clapacs, John T, MD      . insulin aspart (novoLOG) injection 0-5 Units  0-5 Units Subcutaneous QHS Money, Lowry Ram, FNP   2 Units at 08/09/18 2142  . insulin aspart (novoLOG) injection 0-9 Units  0-9 Units Subcutaneous TID WC Money, Lowry Ram, FNP   2 Units at 08/18/18 0755  . lamoTRIgine (LAMICTAL) tablet 100 mg  100 mg Oral QHS Clapacs, John T, MD   100 mg at 08/18/18 0032  . levothyroxine (SYNTHROID) tablet 150 mcg  150 mcg Oral Q0600 Money, Lowry Ram, FNP   150 mcg at 08/18/18 0867  . loratadine (CLARITIN) tablet 10 mg  10 mg Oral Daily Clapacs, Madie Reno, MD   10 mg at  08/18/18 0753  . LORazepam (ATIVAN) tablet 2 mg  2 mg Oral Q4H PRN Clapacs, Madie Reno, MD   2 mg at 08/08/18 2138   Or  . LORazepam (ATIVAN) injection 2 mg  2 mg Intramuscular Q4H PRN Clapacs, Madie Reno, MD   2 mg at 08/16/18 2106  . lurasidone (LATUDA) tablet 80 mg  80 mg Oral BID WC Sharma Covert, MD   80 mg at 08/18/18 0753  . magnesium hydroxide (MILK OF MAGNESIA) suspension 30 mL  30 mL Oral Daily PRN Money, Darnelle Maffucci B, FNP      . metFORMIN (GLUCOPHAGE) tablet 1,000 mg  1,000 mg Oral BID WC Sharma Covert, MD   1,000 mg at 08/18/18 0753  . moxifloxacin (VIGAMOX) 0.5 % ophthalmic solution 1 drop  1 drop Right Eye TID Money, Lowry Ram, FNP   1 drop at 08/18/18 0753  . neomycin-bacitracin-polymyxin (NEOSPORIN) ointment   Topical BID Sharma Covert, MD      . pantoprazole (PROTONIX) EC tablet 40 mg  40 mg Oral Daily Money, Lowry Ram, FNP   40 mg at 08/18/18 6195  . traZODone (DESYREL) tablet 100 mg  100 mg Oral QHS Money, Travis B, FNP   100 mg at 08/18/18 0032  . ziprasidone (GEODON) injection 20 mg  20 mg Intramuscular Q12H  PRN Clapacs, Madie Reno, MD   20 mg at 08/16/18 2107   PTA Medications: Medications Prior to Admission  Medication Sig Dispense Refill Last Dose  . atorvastatin (LIPITOR) 10 MG tablet Take 1 tablet (10 mg total) by mouth daily. 30 tablet 1   . carvedilol (COREG) 6.25 MG tablet Take 1 tablet (6.25 mg total) by mouth 2 (two) times daily. 60 tablet 1   . divalproex (DEPAKOTE) 250 MG DR tablet Take 1 tablet (250 mg total) by mouth 2 (two) times daily. 60 tablet 0   . ferrous sulfate (FEROSUL) 325 (65 FE) MG tablet Take 1 tablet (325 mg total) by mouth 2 (two) times daily. 60 tablet 1   . gabapentin (NEURONTIN) 600 MG tablet Take 1 tablet (600 mg total) by mouth 3 (three) times daily. 90 tablet 1   . glipiZIDE (GLUCOTROL XL) 5 MG 24 hr tablet Take 5 mg by mouth daily.     . insulin detemir (LEVEMIR) 100 UNIT/ML injection Inject 0.1 mLs (10 Units total) into the skin 2 (two)  times daily. (Patient not taking: Reported on 07/21/2018) 10 mL 11   . lamoTRIgine (LAMICTAL) 25 MG tablet Take 1 tablet (25 mg total) by mouth 2 (two) times daily. 60 tablet 0   . levothyroxine (SYNTHROID) 150 MCG tablet Take 1 tablet (150 mcg total) by mouth daily at 6 (six) AM. 30 tablet 1   . lisinopril (ZESTRIL) 10 MG tablet Take 1 tablet (10 mg total) by mouth daily. 30 tablet 1   . loratadine (CLARITIN) 10 MG tablet Take 1 tablet (10 mg total) by mouth daily. 30 tablet 1   . metFORMIN (GLUCOPHAGE) 500 MG tablet Take 1 tablet (500 mg total) by mouth 2 (two) times daily. 60 tablet 1   . moxifloxacin (VIGAMOX) 0.5 % ophthalmic solution Place 1 drop into the right eye 3 (three) times daily. For 5 days 3 mL 0   . paliperidone (INVEGA) 3 MG 24 hr tablet Take 1 tablet (3 mg total) by mouth daily. 30 tablet 0   . pantoprazole (PROTONIX) 40 MG tablet Take 1 tablet (40 mg total) by mouth daily. 30 tablet 1     Patient Stressors: Health problems Legal issue Other: Artist  Patient Strengths: Active sense of humor Religious Affiliation  Treatment Modalities: Medication Management, Group therapy, Case management,  1 to 1 session with clinician, Psychoeducation, Recreational therapy.   Physician Treatment Plan for Primary Diagnosis: Schizoaffective disorder, bipolar type ([place]) Long Term Goal(s): Improvement in symptoms so as ready for discharge Improvement in symptoms so as ready for discharge   Short Term Goals: Ability to demonstrate self-control will improve Ability to identify and develop effective coping behaviors will improve Ability to maintain clinical measurements within normal limits will improve Compliance with prescribed medications will improve  Medication Management: Evaluate patient's response, side effects, and tolerance of medication regimen.  Therapeutic Interventions: 1 to 1 sessions, Unit Group sessions and Medication administration.  Evaluation of Outcomes:  Progressing  Physician Treatment Plan for Secondary Diagnosis: Principal Problem:   Schizoaffective disorder, bipolar type ([place]) Active Problems:   Diabetes mellitus without complication ([place])   Intellectual disability   Agitation   Borderline personality disorder (HCC)   HTN (hypertension)   Hypothyroidism   Bipolar disorder, unspecified ([place])  Long Term Goal(s): Improvement in symptoms so as ready for discharge Improvement in symptoms so as ready for discharge   Short Term Goals: Ability to demonstrate self-control will improve Ability to identify and develop  effective coping behaviors will improve Ability to maintain clinical measurements within normal limits will improve Compliance with prescribed medications will improve     Medication Management: Evaluate patient's response, side effects, and tolerance of medication regimen.  Therapeutic Interventions: 1 to 1 sessions, Unit Group sessions and Medication administration.  Evaluation of Outcomes: Progressing   RN Treatment Plan for Primary Diagnosis: Schizoaffective disorder, bipolar type ([place]) Long Term Goal(s): Knowledge of disease and therapeutic regimen to maintain health will improve  Short Term Goals: Ability to verbalize frustration and anger appropriately will improve, Ability to demonstrate self-control, Ability to participate in decision making will improve, Ability to verbalize feelings will improve and Ability to identify and develop effective coping behaviors will improve  Medication Management: RN will administer medications as ordered by provider, will assess and evaluate patient's response and provide education to patient for prescribed medication. RN will report any adverse and/or side effects to prescribing provider.  Therapeutic Interventions: 1 on 1 counseling sessions, Psychoeducation, Medication administration, Evaluate responses to treatment, Monitor vital signs and CBGs as ordered, Perform/monitor CIWA,  COWS, AIMS and Fall Risk screenings as ordered, Perform wound care treatments as ordered.  Evaluation of Outcomes: Progressing   LCSW Treatment Plan for Primary Diagnosis: Schizoaffective disorder, bipolar type ([place]) Long Term Goal(s): Safe transition to appropriate next level of care at discharge, Engage patient in therapeutic group addressing interpersonal concerns.  Short Term Goals: Engage patient in aftercare planning with referrals and resources, Increase social support, Increase ability to appropriately verbalize feelings, Increase emotional regulation and Facilitate acceptance of mental health diagnosis and concerns  Therapeutic Interventions: Assess for all discharge needs, 1 to 1 time with Social worker, Explore available resources and support systems, Assess for adequacy in community support network, Educate family and significant other(s) on suicide prevention, Complete Psychosocial Assessment, Interpersonal group therapy.  Evaluation of Outcomes: Progressing   Progress in Treatment: Attending groups: Yes. Participating in groups: Yes. Taking medication as prescribed: Yes. Toleration medication: Yes. Family/Significant other contact made: Yes, individual(s) contacted:  Pts legal guardian Fayne Norrie Patient understands diagnosis: Yes. Discussing patient identified problems/goals with staff: Yes. Medical problems stabilized or resolved: Yes. Denies suicidal/homicidal ideation: Yes. Issues/concerns per patient self-inventory: No. Other: none  New problem(s) identified: No, Describe:  none  New Short Term/Long Term Goal(s): medication management for mood stabilization; elimination of SI thoughts; development of comprehensive mental wellness plan.  Patient Goals:  "To find a new group home"  Discharge Plan or Barriers: At this time the patient is unable to return to her group home due to aggressive behaviors.  Patient will need support in identifying alternative placement.   Update 7/27:  Referral for [facility]) has been re-faxed several times.  CSW team has been in contact with [facility] several times who report each time that they do not have the information and on 7/24 the information was sent for the third time. 08/18/2018-Legal guardian has contacted several group homes, many have said no due to patient history of aggressive behavior. [facility] referral under review by unit nurse director.     Reason for Continuation of Hospitalization: Medication stabilization  Estimated Length of Stay:  TBD  Recreational Therapy: Patient: N/A Patient Goal: Patient will demonstrate no violent or destructive behaviors during recreation therapy group sessions for duration of admission  Attendees: Patient:  08/18/2018 10:49 AM  Physician: Dr. Weber Cooks, MD 08/18/2018 10:49 AM  Nursing:  08/18/2018 10:49 AM  RN Care Manager: 08/18/2018 10:49 AM  Social Worker: Sanjuana Kava  LCSW 08/18/2018 10:49 AM  Recreational Therapist:  08/18/2018 10:49 AM  Other:  08/18/2018 10:49 AM  Other:  08/18/2018 10:49 AM  Other: 08/18/2018 10:49 AM    Scribe for Treatment Team: Yvette Rack, LCSW 08/18/2018 10:49 AM

## 2018-08-18 NOTE — BHH Counselor (Signed)
CSW received vm from [place] at [facility] who states an exception diversion form did not need to be completed since the pt had been there before and it was documented that it was okay to admit her without an exception. He says the referral is now being sent to medical for review. Updated psych notes faxed, per [facility] request.

## 2018-08-18 NOTE — BHH Group Notes (Signed)
Balance In Life 08/18/2018 1PM  Type of Therapy/Topic:  Group Therapy:  Balance in Life  Participation Level:  Active  Description of Group:   This group will address the concept of balance and how it feels and looks when one is unbalanced. Patients will be encouraged to process areas in their lives that are out of balance and identify reasons for remaining unbalanced. Facilitators will guide patients in utilizing problem-solving interventions to address and correct the stressor making their life unbalanced. Understanding and applying boundaries will be explored and addressed for obtaining and maintaining a balanced life. Patients will be encouraged to explore ways to assertively make their unbalanced needs known to significant others in their lives, using other group members and facilitator for support and feedback.  Therapeutic Goals: 1. Patient will identify two or more emotions or situations they have that consume much of in their lives. 2. Patient will identify signs/triggers that life has become out of balance:  3. Patient will identify two ways to set boundaries in order to achieve balance in their lives:  4. Patient will demonstrate ability to communicate their needs through discussion and/or role plays  Summary of Patient Progress:  Patient discussed needing to work on controlling her anger and identified consequences she has received as a result of poor decision making. Patient interacted appropriately with group member and respected boundaries.  Therapeutic Modalities:   Cognitive Behavioral Therapy Solution-Focused Therapy Assertiveness Training  Keng Jewel Lynelle Smoke, LCSW

## 2018-08-18 NOTE — BHH Counselor (Signed)
CSW received phone call from [place] at [facility] who reports the referral is being reviewed by the unit nurse director.

## 2018-08-18 NOTE — BHH Counselor (Signed)
CSW spoke with Jessica [name] at [facility] was informed that the patient's referral was "in medical for evaluation".  He reports that they will call back once the medical team has reviewed it and made a decision.   Assunta Curtis, MSW, LCSW 08/18/2018 9:52 AM

## 2018-08-18 NOTE — Progress Notes (Signed)
Patient alert and oriented x 4, affect is brighter, she appears more receptive to nursing staff,affect is b righter, she is not intrusive, less  demanding and attention seeking, not hostile or aggressive to staff. Patient's thoughts are organized and coherent, she follows simple command. Emotional support and encouragement offered to staff, she was complaint with medication regimen, 15 minutes safety checks maintained will continue to monitor.

## 2018-08-19 LAB — GLUCOSE, CAPILLARY
Glucose-Capillary: 176 mg/dL — ABNORMAL HIGH (ref 70–99)
Glucose-Capillary: 91 mg/dL (ref 70–99)
Glucose-Capillary: 139 mg/dL — ABNORMAL HIGH (ref 70–99)
Glucose-Capillary: 148 mg/dL — ABNORMAL HIGH (ref 70–99)

## 2018-08-19 NOTE — Progress Notes (Signed)
Patient alert and oriented x 4 , affect is blunted , she appears irritable and demanding asking for writer to move her next door peer to another room. Patient was advised that information concerning the next patient cannot be discussed and she cant dictate other patient's care of plan. Patient was offered emotional support and encouraged  to attend evening wrap up group. 15 minutes safety checks maintained will continue to monitor.

## 2018-08-19 NOTE — Progress Notes (Signed)
[facility] MD Progress Note  08/19/2018 5:30 PM Jessica [name]  MRN:  [id number] Subjective: Patient seen chart reviewed.  No new complaints.  Denies hallucinations.  Denies suicidal or homicidal ideation.  No physical problems. Principal Problem: Schizoaffective disorder, bipolar type ([place]) Diagnosis: Principal Problem:   Schizoaffective disorder, bipolar type ([place]) Active Problems:   Diabetes mellitus without complication ([place])   Intellectual disability   Agitation   Borderline personality disorder (HCC)   HTN (hypertension)   Hypothyroidism   Bipolar disorder, unspecified ([place])  Total Time spent with patient: 20 minutes  Past Psychiatric History: Patient has a long history of schizoaffective disorder and developmental disability  Past Medical History:  Past Medical History:  Diagnosis Date  . Anxiety   . Depression   . Diabetes mellitus without complication ([place])   . GERD (gastroesophageal reflux disease)   . Homicidal ideation   . Hypertension   . Hypothyroidism     Past Surgical History:  Procedure Laterality Date  . COLPOSCOPY    . FRACTURE SURGERY    . TRACHEOSTOMY     Family History: History reviewed. No pertinent family history. Family Psychiatric  History: See previous Social History:  Social History   Substance and Sexual Activity  Alcohol Use No  . Frequency: Never     Social History   Substance and Sexual Activity  Drug Use Yes  . Types: Marijuana, "Crack" cocaine   Comment: reports she has not done anything in a long time    Social History   Socioeconomic History  . Marital status: Single    Spouse name: Not on file  . Number of children: Not on file  . Years of education: Not on file  . Highest education level: Not on file  Occupational History  . Not on file  Social Needs  . Financial resource strain: Somewhat hard  . Food insecurity    Worry: Patient refused    Inability: Patient refused  . Transportation needs    Medical: Patient refused     Non-medical: Patient refused  Tobacco Use  . Smoking status: Former Research scientist (life sciences)  . Smokeless tobacco: Never Used  Substance and Sexual Activity  . Alcohol use: No    Frequency: Never  . Drug use: Yes    Types: Marijuana, "Crack" cocaine    Comment: reports she has not done anything in a long time  . Sexual activity: Not Currently    Birth control/protection: Abstinence  Lifestyle  . Physical activity    Days per week: Patient refused    Minutes per session: Patient refused  . Stress: Rather much  Relationships  . Social Herbalist on phone: Patient refused    Gets together: Patient refused    Attends religious service: Patient refused    Active member of club or organization: Patient refused    Attends meetings of clubs or organizations: Patient refused    Relationship status: Patient refused  Other Topics Concern  . Not on file  Social History Narrative  . Not on file   Additional Social History:                         Sleep: Fair  Appetite:  Fair  Current Medications: Current Facility-Administered Medications  Medication Dose Route Frequency Provider Last Rate Last Dose  . acetaminophen (TYLENOL) tablet 650 mg  650 mg Oral Q6H PRN Money, Lowry Ram, FNP   650 mg at 08/19/18 0739  .  alum & mag hydroxide-simeth (MAALOX/MYLANTA) 200-200-20 MG/5ML suspension 30 mL  30 mL Oral Q4H PRN Money, Lowry Ram, FNP      . atorvastatin (LIPITOR) tablet 10 mg  10 mg Oral q1800 Money, Lowry Ram, FNP   10 mg at 08/18/18 1710  . DULoxetine (CYMBALTA) DR capsule 60 mg  60 mg Oral Daily Clapacs, Madie Reno, MD   60 mg at 08/19/18 0740  . ferrous sulfate tablet 325 mg  325 mg Oral BID WC Money, Lowry Ram, FNP   325 mg at 08/19/18 0739  . gabapentin (NEURONTIN) capsule 600 mg  600 mg Oral TID Clapacs, John T, MD   600 mg at 08/19/18 1228  . hydrOXYzine (ATARAX/VISTARIL) tablet 50 mg  50 mg Oral Q6H PRN Clapacs, John T, MD      . insulin aspart (novoLOG) injection 0-5 Units  0-5  Units Subcutaneous QHS Money, Lowry Ram, FNP   2 Units at 08/09/18 2142  . insulin aspart (novoLOG) injection 0-9 Units  0-9 Units Subcutaneous TID WC Money, Lowry Ram, FNP   1 Units at 08/19/18 1632  . lamoTRIgine (LAMICTAL) tablet 100 mg  100 mg Oral QHS Clapacs, Madie Reno, MD   100 mg at 08/18/18 2138  . levothyroxine (SYNTHROID) tablet 150 mcg  150 mcg Oral Q0600 Money, Lowry Ram, FNP   150 mcg at 08/19/18 0640  . loratadine (CLARITIN) tablet 10 mg  10 mg Oral Daily Clapacs, Madie Reno, MD   10 mg at 08/19/18 0740  . LORazepam (ATIVAN) tablet 2 mg  2 mg Oral Q4H PRN Clapacs, Madie Reno, MD   2 mg at 08/19/18 1454   Or  . LORazepam (ATIVAN) injection 2 mg  2 mg Intramuscular Q4H PRN Clapacs, Madie Reno, MD   2 mg at 08/16/18 2106  . lurasidone (LATUDA) tablet 80 mg  80 mg Oral BID WC Sharma Covert, MD   80 mg at 08/19/18 0740  . magnesium hydroxide (MILK OF MAGNESIA) suspension 30 mL  30 mL Oral Daily PRN Money, Darnelle Maffucci B, FNP      . metFORMIN (GLUCOPHAGE) tablet 1,000 mg  1,000 mg Oral BID WC Sharma Covert, MD   1,000 mg at 08/19/18 0740  . moxifloxacin (VIGAMOX) 0.5 % ophthalmic solution 1 drop  1 drop Right Eye TID Money, Lowry Ram, FNP   1 drop at 08/19/18 1228  . neomycin-bacitracin-polymyxin (NEOSPORIN) ointment   Topical BID Sharma Covert, MD      . pantoprazole (PROTONIX) EC tablet 40 mg  40 mg Oral Daily Money, Lowry Ram, FNP   40 mg at 08/19/18 0739  . traZODone (DESYREL) tablet 100 mg  100 mg Oral QHS Money, Lowry Ram, FNP   100 mg at 08/18/18 2138  . ziprasidone (GEODON) injection 20 mg  20 mg Intramuscular Q12H PRN Clapacs, Madie Reno, MD   20 mg at 08/16/18 2107    Lab Results:  Results for orders placed or performed during the hospital encounter of 08/04/18 (from the past 48 hour(s))  Glucose, capillary     Status: None   Collection Time: 08/17/18  8:41 PM  Result Value Ref Range   Glucose-Capillary 88 70 - 99 mg/dL   Comment 1 Notify RN   Glucose, capillary     Status: Abnormal    Collection Time: 08/18/18  7:10 AM  Result Value Ref Range   Glucose-Capillary 183 (H) 70 - 99 mg/dL   Comment 1 Notify RN   Glucose, capillary  Status: Abnormal   Collection Time: 08/18/18 11:37 AM  Result Value Ref Range   Glucose-Capillary 102 (H) 70 - 99 mg/dL  Glucose, capillary     Status: Abnormal   Collection Time: 08/18/18  4:19 PM  Result Value Ref Range   Glucose-Capillary 100 (H) 70 - 99 mg/dL  Glucose, capillary     Status: None   Collection Time: 08/18/18  8:18 PM  Result Value Ref Range   Glucose-Capillary 90 70 - 99 mg/dL  Glucose, capillary     Status: Abnormal   Collection Time: 08/19/18  7:10 AM  Result Value Ref Range   Glucose-Capillary 176 (H) 70 - 99 mg/dL   Comment 1 Notify RN   Glucose, capillary     Status: None   Collection Time: 08/19/18 11:26 AM  Result Value Ref Range   Glucose-Capillary 91 70 - 99 mg/dL   Comment 1 Notify RN   Glucose, capillary     Status: Abnormal   Collection Time: 08/19/18  4:11 PM  Result Value Ref Range   Glucose-Capillary 139 (H) 70 - 99 mg/dL    Blood Alcohol level:  Lab Results  Component Value Date   ETH <10 07/19/2018   ETH <10 67/20/9470    Metabolic Disorder Labs: Lab Results  Component Value Date   HGBA1C 5.3 06/05/2018   MPG 105.41 06/05/2018   MPG 102.54 12/30/2017   No results found for: PROLACTIN Lab Results  Component Value Date   CHOL 155 06/05/2018   TRIG 93 06/05/2018   HDL 54 06/05/2018   CHOLHDL 2.9 06/05/2018   VLDL 19 06/05/2018   LDLCALC 82 06/05/2018   [place] 90 12/30/2017    Physical Findings: AIMS:  , ,  ,  ,    CIWA:    COWS:     Musculoskeletal: Strength & Muscle Tone: within normal limits Gait & Station: normal Patient leans: N/A  Psychiatric Specialty Exam: Physical Exam  Nursing note and vitals reviewed. Constitutional: She appears well-developed and well-nourished.  HENT:  Head: Normocephalic and atraumatic.  Eyes: Pupils are equal, round, and reactive to  light. Conjunctivae are normal.  Neck: Normal range of motion.  Cardiovascular: Regular rhythm and normal heart sounds.  Respiratory: Effort normal.  GI: Soft.  Musculoskeletal: Normal range of motion.  Neurological: She is alert.  Skin: Skin is warm and dry.  Psychiatric: She has a normal mood and affect. Her behavior is normal. Judgment and thought content normal.    Review of Systems  Constitutional: Negative.   HENT: Negative.   Eyes: Negative.   Respiratory: Negative.   Cardiovascular: Negative.   Gastrointestinal: Negative.   Musculoskeletal: Negative.   Skin: Negative.   Neurological: Negative.   Psychiatric/Behavioral: Negative.     Blood pressure (!) 132/91, pulse 83, temperature (!) 97.5 F (36.4 C), temperature source Oral, resp. rate 18, height 4\' 10"  (1.473 m), weight 72.6 kg, SpO2 96 %.Body mass index is 33.44 kg/m.  General Appearance: Casual  Eye Contact:  Fair  Speech:  Clear and Coherent  Volume:  Normal  Mood:  Euthymic  Affect:  Congruent  Thought Process:  Goal Directed  Orientation:  Full (Time, Place, and Person)  Thought Content:  Logical  Suicidal Thoughts:  No  Homicidal Thoughts:  No  Memory:  Immediate;   Fair Recent;   Fair Remote;   Fair  Judgement:  Fair  Insight:  Fair  Psychomotor Activity:  Decreased  Concentration:  Concentration: Fair  Recall:  Fair  Fund of Knowledge:  Fair  Language:  Fair  Akathisia:  No  Handed:  Right  AIMS (if indicated):     Assets:  Desire for Improvement  ADL's:  Intact  Cognition:  Impaired,  Moderate  Sleep:  Number of Hours: 6.5     Treatment Plan Summary: Daily contact with patient to assess and evaluate symptoms and progress in treatment, Medication management and Plan No change to medication management.  Blood sugars and blood pressure under reasonably good control.  Behavior calm.  Supportive counseling and review with patient the treatment plan mostly malfunctioned on discharge  planning  Alethia Berthold, MD 08/19/2018, 5:30 PM

## 2018-08-19 NOTE — Plan of Care (Signed)
Pt denies depression, anxiety, SI, HI and AVH, Pt has been educated on care plan and verbalizes understanding. Collier Bullock RN Problem: Education: Goal: Knowledge of [place] [facility] Education information/materials will improve Outcome: Progressing   Problem: Coping: Goal: Ability to verbalize frustrations and anger appropriately will improve Outcome: Progressing Goal: Ability to demonstrate self-control will improve Outcome: Progressing   Problem: Safety: Goal: Periods of time without injury will increase Outcome: Progressing   Problem: Safety: Goal: Ability to redirect hostility and anger into socially appropriate behaviors will improve Outcome: Progressing   Problem: Activity: Goal: Interest or engagement in leisure activities will improve Outcome: Progressing Goal: Imbalance in normal sleep/wake cycle will improve Outcome: Progressing

## 2018-08-19 NOTE — [facility] Group Notes (Signed)
LCSW Group Therapy Note  08/19/2018 1:00 PM  Type of Therapy and Topic:  Group Therapy:  Feelings around Relapse and Recovery  Participation Level:  Active   Description of Group:    Patients in this group will discuss emotions they experience before and after a relapse. They will process how experiencing these feelings, or avoidance of experiencing them, relates to having a relapse. Facilitator will guide patients to explore emotions they have related to recovery. Patients will be encouraged to process which emotions are more powerful. They will be guided to discuss the emotional reaction significant others in their lives may have to their relapse or recovery. Patients will be assisted in exploring ways to respond to the emotions of others without this contributing to a relapse.  Therapeutic Goals: 1. Patient will identify two or more emotions that lead to a relapse for them 2. Patient will identify two emotions that result when they relapse 3. Patient will identify two emotions related to recovery 4. Patient will demonstrate ability to communicate their needs through discussion and/or role plays   Summary of Patient Progress: Patient was present for group.  Patient was an active participant.  Patient shared how not being compliant with medication can preclude a relapse in someone's mental health.  She also shared how she utilizes art, walking and music as coping skills.   Therapeutic Modalities:   Cognitive Behavioral Therapy Solution-Focused Therapy Assertiveness Training Relapse Prevention Therapy   Assunta Curtis, MSW, LCSW 08/19/2018 12:49 PM

## 2018-08-19 NOTE — Progress Notes (Signed)
Recreation Therapy Notes  Date: 08/19/2018  Time: 9:30 am   Location: Craft room   Behavioral response: N/A   Intervention Topic: Coping Skills  Discussion/Intervention: Patient did not attend group.   Clinical Observations/Feedback:  Patient did not attend group.   Marta Bouie LRT/CTRS        Jessica [name] 08/19/2018 11:20 AM

## 2018-08-19 NOTE — Progress Notes (Signed)
CSW spoke with Pamala Hurry from [facility] who reported she needs a CBC, CMP, and latest psych notes on pt. She inquired about a tracheotomy for pt. CSW faxed information to [phone] and called Pamala Hurry to ensure she received it. Pamala Hurry reported she received the information and will pass it along to medical and give CSW a call back.   CSW received a call from [facility] who reported he will not take pt at this time because she has too many behaviors for their group home.  Evalina Field, MSW, LCSW Clinical Social Work 08/19/2018 12:05 PM

## 2018-08-20 LAB — GLUCOSE, CAPILLARY
Glucose-Capillary: 215 mg/dL — ABNORMAL HIGH (ref 70–99)
Glucose-Capillary: 147 mg/dL — ABNORMAL HIGH (ref 70–99)
Glucose-Capillary: 136 mg/dL — ABNORMAL HIGH (ref 70–99)
Glucose-Capillary: 89 mg/dL (ref 70–99)

## 2018-08-20 NOTE — Plan of Care (Signed)
  Problem: Coping: Goal: Ability to verbalize frustrations and anger appropriately will improve Outcome: Progressing  Patient verbalized frustrations and anger to staff.  

## 2018-08-20 NOTE — Progress Notes (Signed)
Patient alert and oriented x 4, affect is blunted. she appears less irritable and less  demanding, patient's thoughts are organized and coherent, noted interacting appropriately with peers and staff. Patient was complaint with medication regimen, she was offered emotional support and encouraged  to attend evening wrap up group. 15 minutes safety checks maintained will continue to monitor.

## 2018-08-20 NOTE — Plan of Care (Signed)
Patient unable to process all  information  received. Staff gave information in concrete  formate .   Continue to  work on anger behavior and  frustration. Staff continue to guard  and monitor  behavior  around staff and peers . Voice no concerns  around wake or sleep cycle.   Problem: Education: Goal: Knowledge of [place] [facility] Education information/materials will improve Outcome: Progressing   Problem: Coping: Goal: Ability to verbalize frustrations and anger appropriately will improve Outcome: Progressing Goal: Ability to demonstrate self-control will improve Outcome: Progressing  l Problem: Safety: Goal: Periods of time without injury will increase Outcome: Progressing   Problem: Safety: Goal: Ability to redirect hostility and anger into socially appropriate behaviors will improve Outcome: Progressing   Problem: Activity: Goal: Interest or engagement in leisure activities will improve Outcome: Progressing Goal: Imbalance in normal sleep/wake cycle will improve Outcome: Progressing

## 2018-08-20 NOTE — Progress Notes (Signed)
[facility] MD Progress Note  08/20/2018 2:45 PM Jessica [name]  MRN:  [id number] Subjective: Patient seen and chart reviewed.  Patient continues to keep her mood and behavior in pretty good check.  She is frustrated and can get tearful but pulls it back together pretty easily.  Denies any current suicidal or homicidal ideation.  Not displaying any psychotic or agitated behavior.  Responds well to supportive encouragement and counseling.  No new physical complaints Principal Problem: Schizoaffective disorder, bipolar type ([place]) Diagnosis: Principal Problem:   Schizoaffective disorder, bipolar type ([place]) Active Problems:   Diabetes mellitus without complication ([place])   Intellectual disability   Agitation   Borderline personality disorder (HCC)   HTN (hypertension)   Hypothyroidism   Bipolar disorder, unspecified ([place])  Total Time spent with patient: 30 minutes  Past Psychiatric History: Patient has a history of developmental disabilities schizoaffective disorder mood instability recurrent hospitalizations.  Past Medical History:  Past Medical History:  Diagnosis Date  . Anxiety   . Depression   . Diabetes mellitus without complication ([place])   . GERD (gastroesophageal reflux disease)   . Homicidal ideation   . Hypertension   . Hypothyroidism     Past Surgical History:  Procedure Laterality Date  . COLPOSCOPY    . FRACTURE SURGERY    . TRACHEOSTOMY     Family History: History reviewed. No pertinent family history. Family Psychiatric  History: See previous Social History:  Social History   Substance and Sexual Activity  Alcohol Use No  . Frequency: Never     Social History   Substance and Sexual Activity  Drug Use Yes  . Types: Marijuana, "Crack" cocaine   Comment: reports she has not done anything in a long time    Social History   Socioeconomic History  . Marital status: Single    Spouse name: Not on file  . Number of children: Not on file  . Years of education: Not on  file  . Highest education level: Not on file  Occupational History  . Not on file  Social Needs  . Financial resource strain: Somewhat hard  . Food insecurity    Worry: Patient refused    Inability: Patient refused  . Transportation needs    Medical: Patient refused    Non-medical: Patient refused  Tobacco Use  . Smoking status: Former Research scientist (life sciences)  . Smokeless tobacco: Never Used  Substance and Sexual Activity  . Alcohol use: No    Frequency: Never  . Drug use: Yes    Types: Marijuana, "Crack" cocaine    Comment: reports she has not done anything in a long time  . Sexual activity: Not Currently    Birth control/protection: Abstinence  Lifestyle  . Physical activity    Days per week: Patient refused    Minutes per session: Patient refused  . Stress: Rather much  Relationships  . Social Herbalist on phone: Patient refused    Gets together: Patient refused    Attends religious service: Patient refused    Active member of club or organization: Patient refused    Attends meetings of clubs or organizations: Patient refused    Relationship status: Patient refused  Other Topics Concern  . Not on file  Social History Narrative  . Not on file   Additional Social History:                         Sleep: Fair  Appetite:  Fair  Current Medications: Current Facility-Administered Medications  Medication Dose Route Frequency Provider Last Rate Last Dose  . acetaminophen (TYLENOL) tablet 650 mg  650 mg Oral Q6H PRN Money, Lowry Ram, FNP   650 mg at 08/19/18 0739  . alum & mag hydroxide-simeth (MAALOX/MYLANTA) 200-200-20 MG/5ML suspension 30 mL  30 mL Oral Q4H PRN Money, Lowry Ram, FNP      . atorvastatin (LIPITOR) tablet 10 mg  10 mg Oral q1800 Money, Lowry Ram, FNP   10 mg at 08/19/18 1759  . DULoxetine (CYMBALTA) DR capsule 60 mg  60 mg Oral Daily Michaella Imai, Madie Reno, MD   60 mg at 08/20/18 0806  . ferrous sulfate tablet 325 mg  325 mg Oral BID WC Money, Lowry Ram, FNP    325 mg at 08/20/18 3419  . gabapentin (NEURONTIN) capsule 600 mg  600 mg Oral TID Jerlene Rockers T, MD   600 mg at 08/20/18 1125  . hydrOXYzine (ATARAX/VISTARIL) tablet 50 mg  50 mg Oral Q6H PRN Arayla Kruschke, Madie Reno, MD   50 mg at 08/20/18 1102  . insulin aspart (novoLOG) injection 0-5 Units  0-5 Units Subcutaneous QHS Money, Lowry Ram, FNP   2 Units at 08/09/18 2142  . insulin aspart (novoLOG) injection 0-9 Units  0-9 Units Subcutaneous TID WC Money, Lowry Ram, FNP   1 Units at 08/20/18 1129  . lamoTRIgine (LAMICTAL) tablet 100 mg  100 mg Oral QHS Brooklyn Alfredo T, MD   100 mg at 08/19/18 2100  . levothyroxine (SYNTHROID) tablet 150 mcg  150 mcg Oral Q0600 Money, Lowry Ram, FNP   150 mcg at 08/20/18 3790  . loratadine (CLARITIN) tablet 10 mg  10 mg Oral Daily Mollee Neer, Madie Reno, MD   10 mg at 08/20/18 0807  . LORazepam (ATIVAN) tablet 2 mg  2 mg Oral Q4H PRN Lenah Messenger, Madie Reno, MD   2 mg at 08/19/18 1454   Or  . LORazepam (ATIVAN) injection 2 mg  2 mg Intramuscular Q4H PRN Butler Vegh, Madie Reno, MD   2 mg at 08/16/18 2106  . lurasidone (LATUDA) tablet 80 mg  80 mg Oral BID WC Sharma Covert, MD   80 mg at 08/20/18 2409  . magnesium hydroxide (MILK OF MAGNESIA) suspension 30 mL  30 mL Oral Daily PRN Money, Darnelle Maffucci B, FNP      . metFORMIN (GLUCOPHAGE) tablet 1,000 mg  1,000 mg Oral BID WC Sharma Covert, MD   1,000 mg at 08/20/18 0806  . moxifloxacin (VIGAMOX) 0.5 % ophthalmic solution 1 drop  1 drop Right Eye TID Money, Lowry Ram, FNP   1 drop at 08/20/18 1130  . neomycin-bacitracin-polymyxin (NEOSPORIN) ointment   Topical BID Sharma Covert, MD      . pantoprazole (PROTONIX) EC tablet 40 mg  40 mg Oral Daily Money, Lowry Ram, FNP   40 mg at 08/20/18 0806  . traZODone (DESYREL) tablet 100 mg  100 mg Oral QHS Money, Travis B, FNP   100 mg at 08/19/18 2200  . ziprasidone (GEODON) injection 20 mg  20 mg Intramuscular Q12H PRN Handy Mcloud, Madie Reno, MD   20 mg at 08/16/18 2107    Lab Results:  Results for orders  placed or performed during the hospital encounter of 08/04/18 (from the past 48 hour(s))  Glucose, capillary     Status: Abnormal   Collection Time: 08/18/18  4:19 PM  Result Value Ref Range   Glucose-Capillary 100 (H) 70 - 99 mg/dL  Glucose, capillary  Status: None   Collection Time: 08/18/18  8:18 PM  Result Value Ref Range   Glucose-Capillary 90 70 - 99 mg/dL  Glucose, capillary     Status: Abnormal   Collection Time: 08/19/18  7:10 AM  Result Value Ref Range   Glucose-Capillary 176 (H) 70 - 99 mg/dL   Comment 1 Notify RN   Glucose, capillary     Status: None   Collection Time: 08/19/18 11:26 AM  Result Value Ref Range   Glucose-Capillary 91 70 - 99 mg/dL   Comment 1 Notify RN   Glucose, capillary     Status: Abnormal   Collection Time: 08/19/18  4:11 PM  Result Value Ref Range   Glucose-Capillary 139 (H) 70 - 99 mg/dL  Glucose, capillary     Status: Abnormal   Collection Time: 08/19/18  8:38 PM  Result Value Ref Range   Glucose-Capillary 148 (H) 70 - 99 mg/dL   Comment 1 Notify RN   Glucose, capillary     Status: Abnormal   Collection Time: 08/20/18  7:02 AM  Result Value Ref Range   Glucose-Capillary 215 (H) 70 - 99 mg/dL   Comment 1 Notify RN   Glucose, capillary     Status: Abnormal   Collection Time: 08/20/18 11:21 AM  Result Value Ref Range   Glucose-Capillary 147 (H) 70 - 99 mg/dL    Blood Alcohol level:  Lab Results  Component Value Date   ETH <10 07/19/2018   ETH <10 24/58/0998    Metabolic Disorder Labs: Lab Results  Component Value Date   HGBA1C 5.3 06/05/2018   MPG 105.41 06/05/2018   MPG 102.54 12/30/2017   No results found for: PROLACTIN Lab Results  Component Value Date   CHOL 155 06/05/2018   TRIG 93 06/05/2018   HDL 54 06/05/2018   CHOLHDL 2.9 06/05/2018   VLDL 19 06/05/2018   LDLCALC 82 06/05/2018   [place] 90 12/30/2017    Physical Findings: AIMS:  , ,  ,  ,    CIWA:    COWS:     Musculoskeletal: Strength & Muscle Tone:  within normal limits Gait & Station: normal Patient leans: N/A  Psychiatric Specialty Exam: Physical Exam  Nursing note and vitals reviewed. Constitutional: She appears well-developed and well-nourished.  HENT:  Head: Normocephalic and atraumatic.  Eyes: Pupils are equal, round, and reactive to light. Conjunctivae are normal.  Neck: Normal range of motion.  Cardiovascular: Regular rhythm and normal heart sounds.  Respiratory: Effort normal.  GI: Soft.  Musculoskeletal: Normal range of motion.  Neurological: She is alert.  Skin: Skin is warm and dry.  Psychiatric: Her affect is blunt. Her speech is delayed. She is slowed. Thought content is not paranoid. Cognition and memory are impaired. She expresses impulsivity. She expresses no homicidal ideation.    Review of Systems  Constitutional: Negative.   HENT: Negative.   Eyes: Negative.   Respiratory: Negative.   Cardiovascular: Negative.   Gastrointestinal: Negative.   Musculoskeletal: Negative.   Skin: Negative.   Neurological: Negative.   Psychiatric/Behavioral: Negative.     Blood pressure 132/76, pulse 82, temperature 98 F (36.7 C), temperature source Oral, resp. rate 18, height 4\' 10"  (1.473 m), weight 72.6 kg, SpO2 96 %.Body mass index is 33.44 kg/m.  General Appearance: Casual  Eye Contact:  Good  Speech:  Clear and Coherent  Volume:  Normal  Mood:  Euthymic  Affect:  Congruent  Thought Process:  Goal Directed  Orientation:  Full (Time,  Place, and Person)  Thought Content:  Logical  Suicidal Thoughts:  No  Homicidal Thoughts:  No  Memory:  Immediate;   Fair Recent;   Fair Remote;   Fair  Judgement:  Fair  Insight:  Fair  Psychomotor Activity:  Decreased  Concentration:  Concentration: Fair  Recall:  [employer] of Knowledge:  Fair  Language:  Fair  Akathisia:  No  Handed:  Right  AIMS (if indicated):     Assets:  Desire for Improvement  ADL's:  Intact  Cognition:  Impaired,  Mild  Sleep:  Number of  Hours: 7     Treatment Plan Summary: Daily contact with patient to assess and evaluate symptoms and progress in treatment, Medication management and Plan We have our fingers crossed that we may be able to hope for discharge this upcoming week but of course these things are never sure until the last minute.  Supportive counseling for the patient along with encouragement and praise no change to medicine.  Alethia Berthold, MD 08/20/2018, 2:45 PM

## 2018-08-20 NOTE — [facility] Group Notes (Signed)
LCSW Group Therapy Note  08/20/2018 1:15pm  Type of Therapy and Topic:  Group Therapy:  Cognitive Distortions  Participation Level:  Active   Description of Group:    Patients in this group will be introduced to the topic of cognitive distortions.  Patients will identify and describe cognitive distortions, describe the feelings these distortions create for them.  Patients will identify one or more situations in their personal life where they have cognitively distorted thinking and will verbalize challenging this cognitive distortion through positive thinking skills.  Patients will practice the skill of using positive affirmations to challenge cognitive distortions using affirmation cards.    Therapeutic Goals:  1. Patient will identify two or more cognitive distortions they have used 2. Patient will identify one or more emotions that stem from use of a cognitive distortion 3. Patient will demonstrate use of a positive affirmation to counter a cognitive distortion through discussion and/or role play. 4. Patient will describe one way cognitive distortions can be detrimental to wellness   Summary of Patient Progress: The patient reported that  she feels better. Patients were introduced to the topic of cognitive distortions. The patient was able to identify and describe cognitive distortions, described the feelings these distortions create for  her.  The patient shared she uses Disqualifying the Positive. Patient identified a situation in her personal life where she has cognitively distorted thinking and was able to verbalize and challenged this cognitive distortion through positive thinking skills. Patient was able to provide support and validation to other group members.     Therapeutic Modalities:   Cognitive Behavioral Therapy Motivational Interviewing   Annajulia Lewing  CUEBAS-COLON, LCSW 08/20/2018 11:21 AM

## 2018-08-20 NOTE — Progress Notes (Signed)
D: Patient stated slept good last night .Stated appetite  good and energy level   normal. Stated concentration  good . Stated on Depression scale 0 , hopeless 0 and anxiety 0 .( low 0-10 high) Denies suicidal  homicidal ideations  .  No auditory hallucinations  No pain concerns . Appropriate ADL'S. Interacting with peers and staff. Patient unable to process all  information  received. Staff gave information in concrete  formate .   Continue to  work on anger behavior and  frustration. Staff continue to guard  and monitor  behavior  around staff and peers . Voice no concerns  around wake or sleep cycle.   Periods of tearfulness during shift. Voice of wanting to go to a group home . Stated she was tired of waiting .  Patient  Went to room , returned to nursing station   With a pillow case secured around her neck. Writer informed patient  Of the  Implication of this action  And how this impacts her placement. Patient again tearful stated she would not do this again . Patient removed the  Pillow case from around her neck.   Again patient stated she was tired of waiting   A: Encourage patient participation with unit programming . Instruction  Given on  Medication , verbalize understanding.  R: Voice no other concerns. Staff continue to monitor

## 2018-08-21 LAB — GLUCOSE, CAPILLARY
Glucose-Capillary: 152 mg/dL — ABNORMAL HIGH (ref 70–99)
Glucose-Capillary: 129 mg/dL — ABNORMAL HIGH (ref 70–99)
Glucose-Capillary: 147 mg/dL — ABNORMAL HIGH (ref 70–99)
Glucose-Capillary: 153 mg/dL — ABNORMAL HIGH (ref 70–99)

## 2018-08-21 NOTE — Progress Notes (Signed)
[facility] MD Progress Note  08/21/2018 2:08 PM Jessica [name]  MRN:  [id number] Subjective: Follow-up patient with schizoaffective disorder.  Patient has no new complaints.  Not aggressive or agitated today.  No hallucinations.  No suicidal or homicidal ideation.  Physically feeling stable.  Taking care of her ADLs without difficulty. Principal Problem: Schizoaffective disorder, bipolar type ([place]) Diagnosis: Principal Problem:   Schizoaffective disorder, bipolar type ([place]) Active Problems:   Diabetes mellitus without complication ([place])   Intellectual disability   Agitation   Borderline personality disorder (HCC)   HTN (hypertension)   Hypothyroidism   Bipolar disorder, unspecified ([place])  Total Time spent with patient: 20 minutes  Past Psychiatric History: Past history of multiple hospitalizations  Past Medical History:  Past Medical History:  Diagnosis Date  . Anxiety   . Depression   . Diabetes mellitus without complication ([place])   . GERD (gastroesophageal reflux disease)   . Homicidal ideation   . Hypertension   . Hypothyroidism     Past Surgical History:  Procedure Laterality Date  . COLPOSCOPY    . FRACTURE SURGERY    . TRACHEOSTOMY     Family History: History reviewed. No pertinent family history. Family Psychiatric  History: See previous Social History:  Social History   Substance and Sexual Activity  Alcohol Use No  . Frequency: Never     Social History   Substance and Sexual Activity  Drug Use Yes  . Types: Marijuana, "Crack" cocaine   Comment: reports she has not done anything in a long time    Social History   Socioeconomic History  . Marital status: Single    Spouse name: Not on file  . Number of children: Not on file  . Years of education: Not on file  . Highest education level: Not on file  Occupational History  . Not on file  Social Needs  . Financial resource strain: Somewhat hard  . Food insecurity    Worry: Patient refused    Inability:  Patient refused  . Transportation needs    Medical: Patient refused    Non-medical: Patient refused  Tobacco Use  . Smoking status: Former Research scientist (life sciences)  . Smokeless tobacco: Never Used  Substance and Sexual Activity  . Alcohol use: No    Frequency: Never  . Drug use: Yes    Types: Marijuana, "Crack" cocaine    Comment: reports she has not done anything in a long time  . Sexual activity: Not Currently    Birth control/protection: Abstinence  Lifestyle  . Physical activity    Days per week: Patient refused    Minutes per session: Patient refused  . Stress: Rather much  Relationships  . Social Herbalist on phone: Patient refused    Gets together: Patient refused    Attends religious service: Patient refused    Active member of club or organization: Patient refused    Attends meetings of clubs or organizations: Patient refused    Relationship status: Patient refused  Other Topics Concern  . Not on file  Social History Narrative  . Not on file   Additional Social History:                         Sleep: Good  Appetite:  Fair  Current Medications: Current Facility-Administered Medications  Medication Dose Route Frequency Provider Last Rate Last Dose  . acetaminophen (TYLENOL) tablet 650 mg  650 mg Oral Q6H PRN Money, Darnelle Maffucci  B, FNP   650 mg at 08/19/18 0739  . alum & mag hydroxide-simeth (MAALOX/MYLANTA) 200-200-20 MG/5ML suspension 30 mL  30 mL Oral Q4H PRN Money, Lowry Ram, FNP      . atorvastatin (LIPITOR) tablet 10 mg  10 mg Oral q1800 Money, Lowry Ram, FNP   10 mg at 08/20/18 1655  . DULoxetine (CYMBALTA) DR capsule 60 mg  60 mg Oral Daily Trevelle Mcgurn, Madie Reno, MD   60 mg at 08/21/18 0741  . ferrous sulfate tablet 325 mg  325 mg Oral BID WC Money, Lowry Ram, FNP   325 mg at 08/21/18 0741  . gabapentin (NEURONTIN) capsule 600 mg  600 mg Oral TID Lundon Verdejo T, MD   600 mg at 08/21/18 1115  . hydrOXYzine (ATARAX/VISTARIL) tablet 50 mg  50 mg Oral Q6H PRN Aristides Luckey,  Madie Reno, MD   50 mg at 08/20/18 1102  . insulin aspart (novoLOG) injection 0-5 Units  0-5 Units Subcutaneous QHS Money, Lowry Ram, FNP   2 Units at 08/09/18 2142  . insulin aspart (novoLOG) injection 0-9 Units  0-9 Units Subcutaneous TID WC Money, Lowry Ram, FNP   1 Units at 08/21/18 1123  . lamoTRIgine (LAMICTAL) tablet 100 mg  100 mg Oral QHS Lisanne Ponce T, MD   100 mg at 08/20/18 2113  . levothyroxine (SYNTHROID) tablet 150 mcg  150 mcg Oral Q0600 Money, Lowry Ram, FNP   150 mcg at 08/21/18 0606  . loratadine (CLARITIN) tablet 10 mg  10 mg Oral Daily Trevan Messman, Madie Reno, MD   10 mg at 08/21/18 0741  . LORazepam (ATIVAN) tablet 2 mg  2 mg Oral Q4H PRN Keosha Rossa, Madie Reno, MD   2 mg at 08/19/18 1454   Or  . LORazepam (ATIVAN) injection 2 mg  2 mg Intramuscular Q4H PRN Stormie Ventola, Madie Reno, MD   2 mg at 08/16/18 2106  . lurasidone (LATUDA) tablet 80 mg  80 mg Oral BID WC Sharma Covert, MD   80 mg at 08/21/18 0741  . magnesium hydroxide (MILK OF MAGNESIA) suspension 30 mL  30 mL Oral Daily PRN Money, Darnelle Maffucci B, FNP      . metFORMIN (GLUCOPHAGE) tablet 1,000 mg  1,000 mg Oral BID WC Sharma Covert, MD   1,000 mg at 08/21/18 0741  . moxifloxacin (VIGAMOX) 0.5 % ophthalmic solution 1 drop  1 drop Right Eye TID Money, Lowry Ram, FNP   1 drop at 08/21/18 1115  . neomycin-bacitracin-polymyxin (NEOSPORIN) ointment   Topical BID Sharma Covert, MD      . pantoprazole (PROTONIX) EC tablet 40 mg  40 mg Oral Daily Money, Lowry Ram, FNP   40 mg at 08/21/18 0741  . traZODone (DESYREL) tablet 100 mg  100 mg Oral QHS Money, Travis B, FNP   100 mg at 08/20/18 2113  . ziprasidone (GEODON) injection 20 mg  20 mg Intramuscular Q12H PRN Elica Almas, Madie Reno, MD   20 mg at 08/16/18 2107    Lab Results:  Results for orders placed or performed during the hospital encounter of 08/04/18 (from the past 48 hour(s))  Glucose, capillary     Status: Abnormal   Collection Time: 08/19/18  4:11 PM  Result Value Ref Range    Glucose-Capillary 139 (H) 70 - 99 mg/dL  Glucose, capillary     Status: Abnormal   Collection Time: 08/19/18  8:38 PM  Result Value Ref Range   Glucose-Capillary 148 (H) 70 - 99 mg/dL   Comment 1  Notify RN   Glucose, capillary     Status: Abnormal   Collection Time: 08/20/18  7:02 AM  Result Value Ref Range   Glucose-Capillary 215 (H) 70 - 99 mg/dL   Comment 1 Notify RN   Glucose, capillary     Status: Abnormal   Collection Time: 08/20/18 11:21 AM  Result Value Ref Range   Glucose-Capillary 147 (H) 70 - 99 mg/dL  Glucose, capillary     Status: Abnormal   Collection Time: 08/20/18  4:17 PM  Result Value Ref Range   Glucose-Capillary 136 (H) 70 - 99 mg/dL  Glucose, capillary     Status: None   Collection Time: 08/20/18  8:43 PM  Result Value Ref Range   Glucose-Capillary 89 70 - 99 mg/dL  Glucose, capillary     Status: Abnormal   Collection Time: 08/21/18  7:05 AM  Result Value Ref Range   Glucose-Capillary 152 (H) 70 - 99 mg/dL   Comment 1 Notify RN   Glucose, capillary     Status: Abnormal   Collection Time: 08/21/18 11:20 AM  Result Value Ref Range   Glucose-Capillary 129 (H) 70 - 99 mg/dL    Blood Alcohol level:  Lab Results  Component Value Date   ETH <10 07/19/2018   ETH <10 47/09/6281    Metabolic Disorder Labs: Lab Results  Component Value Date   HGBA1C 5.3 06/05/2018   MPG 105.41 06/05/2018   MPG 102.54 12/30/2017   No results found for: PROLACTIN Lab Results  Component Value Date   CHOL 155 06/05/2018   TRIG 93 06/05/2018   HDL 54 06/05/2018   CHOLHDL 2.9 06/05/2018   VLDL 19 06/05/2018   LDLCALC 82 06/05/2018   [place] 90 12/30/2017    Physical Findings: AIMS:  , ,  ,  ,    CIWA:    COWS:     Musculoskeletal: Strength & Muscle Tone: within normal limits Gait & Station: normal Patient leans: Right  Psychiatric Specialty Exam: Physical Exam  Nursing note and vitals reviewed. Constitutional: She appears well-developed and well-nourished.   HENT:  Head: Normocephalic and atraumatic.  Eyes: Pupils are equal, round, and reactive to light. Conjunctivae are normal.  Neck: Normal range of motion.  Cardiovascular: Regular rhythm and normal heart sounds.  Respiratory: Effort normal.  GI: Soft.  Musculoskeletal: Normal range of motion.  Neurological: She is alert.  Skin: Skin is warm and dry.  Psychiatric: Her affect is blunt. Her speech is delayed. She is slowed. Thought content is not paranoid. Cognition and memory are impaired. She expresses impulsivity. She expresses no homicidal and no suicidal ideation.    Review of Systems  Constitutional: Negative.   HENT: Negative.   Eyes: Negative.   Respiratory: Negative.   Cardiovascular: Negative.   Gastrointestinal: Negative.   Musculoskeletal: Negative.   Skin: Negative.   Neurological: Negative.   Psychiatric/Behavioral: Negative.     Blood pressure 118/85, pulse 91, temperature 98 F (36.7 C), temperature source Oral, resp. rate 18, height 4\' 10"  (1.473 m), weight 72.6 kg, SpO2 96 %.Body mass index is 33.44 kg/m.  General Appearance: Casual  Eye Contact:  Fair  Speech:  Clear and Coherent  Volume:  Normal  Mood:  Euthymic  Affect:  Constricted  Thought Process:  Goal Directed  Orientation:  Full (Time, Place, and Person)  Thought Content:  Logical  Suicidal Thoughts:  No  Homicidal Thoughts:  No  Memory:  Immediate;   Fair Recent;   Fair Remote;  Fair  Judgement:  Fair  Insight:  Fair  Psychomotor Activity:  Decreased  Concentration:  Concentration: Fair  Recall:  [employer] of Knowledge:  Fair  Language:  Fair  Akathisia:  No  Handed:  Right  AIMS (if indicated):     Assets:  Desire for Improvement  ADL's:  Intact  Cognition:  WNL  Sleep:  Number of Hours: 7     Treatment Plan Summary: Daily contact with patient to assess and evaluate symptoms and progress in treatment, Medication management and Plan No change to current medicine.  Psychoeducation  and supportive therapy done.  We are optimistic about possibly finding placement soon.  Alethia Berthold, MD 08/21/2018, 2:08 PM

## 2018-08-21 NOTE — Progress Notes (Signed)
D: Patient has been attention-seeking with specific female staff. Denies SI, HI and AVH although admits she did have some SI earlier. Contracts for safety. A: continue to monitor for safety R: Safety maintained.

## 2018-08-21 NOTE — Plan of Care (Signed)
  Problem: Coping: Goal: Ability to verbalize frustrations and anger appropriately will improve Outcome: Progressing Goal: Ability to demonstrate self-control will improve Outcome: Progressing  D: Patient has been attention-seeking with specific female staff. Denies SI, HI and AVH although admits she did have some SI earlier. Contracts for safety. A: continue to monitor for safety R: Safety maintained.

## 2018-08-21 NOTE — [facility] Group Notes (Signed)
LCSW Group Therapy Note 08/21/2018 1:15pm  Type of Therapy and Topic: Group Therapy: Feelings Around Returning Home & Establishing a Supportive Framework and Supporting Oneself When Supports Not Available  Participation Level: Did Not Attend  Description of Group:  Patients first processed thoughts and feelings about upcoming discharge. These included fears of upcoming changes, lack of change, new living environments, judgements and expectations from others and overall stigma of mental health issues. The group then discussed the definition of a supportive framework, what that looks and feels like, and how do to discern it from an unhealthy non-supportive network. The group identified different types of supports as well as what to do when your family/friends are less than helpful or unavailable  Therapeutic Goals  1. Patient will identify one healthy supportive network that they can use at discharge. 2. Patient will identify one factor of a supportive framework and how to tell it from an unhealthy network. 3. Patient able to identify one coping skill to use when they do not have positive supports from others. 4. Patient will demonstrate ability to communicate their needs through discussion and/or role plays.  Summary of Patient Progress:  Pt was invited to attend group but chose not to attend. CSW will continue to encourage pt to attend group throughout their admission.   Therapeutic Modalities Cognitive Behavioral Therapy Motivational Interviewing   Mara Favero  CUEBAS-COLON, LCSW 08/21/2018 12:09 PM

## 2018-08-21 NOTE — Plan of Care (Signed)
Data: Patient is appropriate and cooperative to assessment. Patient Denies SI/HI and Denies AVH. Patient has completed daily self inventory worksheet. Patient reports pleasant. Patient has no complaints and a pain rating of 0/10. Patient reports good sleep quality, appetite is good. Patient rates depression "0/10" , feelings of hopelessness "0/10" and anxiety "0/10" Patients goal for today is "patient goal."  Action:  Q x 15 minute observation checks were completed for safety. Patient was provided with education on medications. Patient was offered support and encouragement. Patient was given scheduled medications. Patient  was encourage to attend groups, participate in unit activities and continue with plan of care.    Response: Patient is adherent with schedule medication. Patient is seen in milieu interacting with peers. Patient has no complaints at this time. Patient is receptive to treatment and safety maintained on unit.    Problem: Education: Goal: Knowledge of [place] [facility] Education information/materials will improve Outcome: Progressing   Problem: Safety: Goal: Periods of time without injury will increase Outcome: Progressing   Problem: Activity: Goal: Interest or engagement in leisure activities will improve Outcome: Progressing

## 2018-08-22 LAB — GLUCOSE, CAPILLARY
Glucose-Capillary: 128 mg/dL — ABNORMAL HIGH (ref 70–99)
Glucose-Capillary: 132 mg/dL — ABNORMAL HIGH (ref 70–99)
Glucose-Capillary: 103 mg/dL — ABNORMAL HIGH (ref 70–99)
Glucose-Capillary: 102 mg/dL — ABNORMAL HIGH (ref 70–99)

## 2018-08-22 NOTE — Plan of Care (Signed)
  Problem: Coping: Goal: Ability to verbalize frustrations and anger appropriately will improve Outcome: Progressing Goal: Ability to demonstrate self-control will improve Outcome: Progressing  D: Patient has been less attention-seeking. Denies SI, HI and AVH. Contracts for safety A: Continue to monitor for safety R: Safety maintained

## 2018-08-22 NOTE — Progress Notes (Signed)
Patient spent most of shift resting in bed, cooperative with treatment on shift, blood sugar before bed was 102, she denies any suicidal or homicidal thoughts but remains anxious. Patient is resting in bed eyes closed at this time. No issues to report on shift at this time.

## 2018-08-22 NOTE — [facility] Group Notes (Signed)
LCSW Group Therapy Note   08/22/2018 11:50 AM   Type of Therapy and Topic:  Group Therapy:  Overcoming Obstacles   Participation Level:  Did Not Attend   Description of Group:    In this group patients will be encouraged to explore what they see as obstacles to their own wellness and recovery. They will be guided to discuss their thoughts, feelings, and behaviors related to these obstacles. The group will process together ways to cope with barriers, with attention given to specific choices patients can make. Each patient will be challenged to identify changes they are motivated to make in order to overcome their obstacles. This group will be process-oriented, with patients participating in exploration of their own experiences as well as giving and receiving support and challenge from other group members.   Therapeutic Goals: 1. Patient will identify personal and current obstacles as they relate to admission. 2. Patient will identify barriers that currently interfere with their wellness or overcoming obstacles.  3. Patient will identify feelings, thought process and behaviors related to these barriers. 4. Patient will identify two changes they are willing to make to overcome these obstacles:      Summary of Patient Progress x     Therapeutic Modalities:   Cognitive Behavioral Therapy Solution Focused Therapy Motivational Interviewing Relapse Prevention Therapy  Evalina Field, MSW, LCSW Clinical Social Work 08/22/2018 11:50 AM

## 2018-08-22 NOTE — Progress Notes (Signed)
D: Patient has been less attention-seeking. Denies SI, HI and AVH. Contracts for safety A: Continue to monitor for safety R: Safety maintained

## 2018-08-22 NOTE — Progress Notes (Signed)
[facility] MD Progress Note  08/22/2018 4:55 PM Jessica [name]  MRN:  [id number] Subjective: Patient seen chart reviewed.  Continues to be on her best behavior.  Not aggressive not violent not threatening.  Intrusive at times but able to be redirected with a laugh.  Patient has no physical complaints.  Overall she seems to be doing pretty well despite getting some bad news that the group home we were looking into is declining to take her Principal Problem: Schizoaffective disorder, bipolar type ([place]) Diagnosis: Principal Problem:   Schizoaffective disorder, bipolar type ([place]) Active Problems:   Diabetes mellitus without complication ([place])   Intellectual disability   Agitation   Borderline personality disorder ([place])   HTN (hypertension)   Hypothyroidism   Bipolar disorder, unspecified ([place])  Total Time spent with patient: 30 minutes  Past Psychiatric History: Patient has a long history of developmental disability and behavior problems  Past Medical History:  Past Medical History:  Diagnosis Date  . Anxiety   . Depression   . Diabetes mellitus without complication ([place])   . GERD (gastroesophageal reflux disease)   . Homicidal ideation   . Hypertension   . Hypothyroidism     Past Surgical History:  Procedure Laterality Date  . COLPOSCOPY    . FRACTURE SURGERY    . TRACHEOSTOMY     Family History: History reviewed. No pertinent family history. Family Psychiatric  History: See previous Social History:  Social History   Substance and Sexual Activity  Alcohol Use No  . Frequency: Never     Social History   Substance and Sexual Activity  Drug Use Yes  . Types: Marijuana, "Crack" cocaine   Comment: reports she has not done anything in a long time    Social History   Socioeconomic History  . Marital status: Single    Spouse name: Not on file  . Number of children: Not on file  . Years of education: Not on file  . Highest education level: Not on file  Occupational History   . Not on file  Social Needs  . Financial resource strain: Somewhat hard  . Food insecurity    Worry: Patient refused    Inability: Patient refused  . Transportation needs    Medical: Patient refused    Non-medical: Patient refused  Tobacco Use  . Smoking status: Former Research scientist (life sciences)  . Smokeless tobacco: Never Used  Substance and Sexual Activity  . Alcohol use: No    Frequency: Never  . Drug use: Yes    Types: Marijuana, "Crack" cocaine    Comment: reports she has not done anything in a long time  . Sexual activity: Not Currently    Birth control/protection: Abstinence  Lifestyle  . Physical activity    Days per week: Patient refused    Minutes per session: Patient refused  . Stress: Rather much  Relationships  . Social Herbalist on phone: Patient refused    Gets together: Patient refused    Attends religious service: Patient refused    Active member of club or organization: Patient refused    Attends meetings of clubs or organizations: Patient refused    Relationship status: Patient refused  Other Topics Concern  . Not on file  Social History Narrative  . Not on file   Additional Social History:                         Sleep: Fair  Appetite:  Fair  Current Medications: Current Facility-Administered Medications  Medication Dose Route Frequency Provider Last Rate Last Dose  . acetaminophen (TYLENOL) tablet 650 mg  650 mg Oral Q6H PRN Money, Lowry Ram, FNP   650 mg at 08/19/18 0739  . alum & mag hydroxide-simeth (MAALOX/MYLANTA) 200-200-20 MG/5ML suspension 30 mL  30 mL Oral Q4H PRN Money, Lowry Ram, FNP      . atorvastatin (LIPITOR) tablet 10 mg  10 mg Oral q1800 Money, Lowry Ram, FNP   10 mg at 08/21/18 1606  . DULoxetine (CYMBALTA) DR capsule 60 mg  60 mg Oral Daily , Madie Reno, MD   60 mg at 08/22/18 0815  . ferrous sulfate tablet 325 mg  325 mg Oral BID WC Money, Lowry Ram, FNP   325 mg at 08/22/18 1619  . gabapentin (NEURONTIN) capsule 600 mg   600 mg Oral TID ,  T, MD   600 mg at 08/22/18 1619  . hydrOXYzine (ATARAX/VISTARIL) tablet 50 mg  50 mg Oral Q6H PRN , Madie Reno, MD   50 mg at 08/20/18 1102  . insulin aspart (novoLOG) injection 0-5 Units  0-5 Units Subcutaneous QHS Money, Lowry Ram, FNP   2 Units at 08/09/18 2142  . insulin aspart (novoLOG) injection 0-9 Units  0-9 Units Subcutaneous TID WC Money, Lowry Ram, FNP   1 Units at 08/22/18 1157  . lamoTRIgine (LAMICTAL) tablet 100 mg  100 mg Oral QHS , Madie Reno, MD   100 mg at 08/21/18 2115  . levothyroxine (SYNTHROID) tablet 150 mcg  150 mcg Oral Q0600 Money, Lowry Ram, FNP   150 mcg at 08/22/18 5409  . loratadine (CLARITIN) tablet 10 mg  10 mg Oral Daily , Madie Reno, MD   10 mg at 08/22/18 0815  . LORazepam (ATIVAN) tablet 2 mg  2 mg Oral Q4H PRN , Madie Reno, MD   2 mg at 08/22/18 1623   Or  . LORazepam (ATIVAN) injection 2 mg  2 mg Intramuscular Q4H PRN , Madie Reno, MD   2 mg at 08/16/18 2106  . lurasidone (LATUDA) tablet 80 mg  80 mg Oral BID WC Sharma Covert, MD   80 mg at 08/22/18 1619  . magnesium hydroxide (MILK OF MAGNESIA) suspension 30 mL  30 mL Oral Daily PRN Money, Darnelle Maffucci B, FNP      . metFORMIN (GLUCOPHAGE) tablet 1,000 mg  1,000 mg Oral BID WC Sharma Covert, MD   1,000 mg at 08/22/18 1619  . moxifloxacin (VIGAMOX) 0.5 % ophthalmic solution 1 drop  1 drop Right Eye TID Money, Lowry Ram, FNP   1 drop at 08/22/18 1619  . neomycin-bacitracin-polymyxin (NEOSPORIN) ointment   Topical BID Sharma Covert, MD   1 application at 81/19/14 1619  . pantoprazole (PROTONIX) EC tablet 40 mg  40 mg Oral Daily Money, Lowry Ram, FNP   40 mg at 08/22/18 0816  . traZODone (DESYREL) tablet 100 mg  100 mg Oral QHS Money, Travis B, FNP   100 mg at 08/21/18 2115  . ziprasidone (GEODON) injection 20 mg  20 mg Intramuscular Q12H PRN , Madie Reno, MD   20 mg at 08/16/18 2107    Lab Results:  Results for orders placed or performed during the hospital  encounter of 08/04/18 (from the past 48 hour(s))  Glucose, capillary     Status: None   Collection Time: 08/20/18  8:43 PM  Result Value Ref Range   Glucose-Capillary 89 70 - 99 mg/dL  Glucose, capillary     Status: Abnormal   Collection Time: 08/21/18  7:05 AM  Result Value Ref Range   Glucose-Capillary 152 (H) 70 - 99 mg/dL   Comment 1 Notify RN   Glucose, capillary     Status: Abnormal   Collection Time: 08/21/18 11:20 AM  Result Value Ref Range   Glucose-Capillary 129 (H) 70 - 99 mg/dL  Glucose, capillary     Status: Abnormal   Collection Time: 08/21/18  4:05 PM  Result Value Ref Range   Glucose-Capillary 147 (H) 70 - 99 mg/dL  Glucose, capillary     Status: Abnormal   Collection Time: 08/21/18  8:20 PM  Result Value Ref Range   Glucose-Capillary 153 (H) 70 - 99 mg/dL  Glucose, capillary     Status: Abnormal   Collection Time: 08/22/18  6:40 AM  Result Value Ref Range   Glucose-Capillary 128 (H) 70 - 99 mg/dL  Glucose, capillary     Status: Abnormal   Collection Time: 08/22/18 11:19 AM  Result Value Ref Range   Glucose-Capillary 132 (H) 70 - 99 mg/dL   Comment 1 Notify RN   Glucose, capillary     Status: Abnormal   Collection Time: 08/22/18  4:19 PM  Result Value Ref Range   Glucose-Capillary 103 (H) 70 - 99 mg/dL    Blood Alcohol level:  Lab Results  Component Value Date   ETH <10 07/19/2018   ETH <10 07/15/9483    Metabolic Disorder Labs: Lab Results  Component Value Date   HGBA1C 5.3 06/05/2018   MPG 105.41 06/05/2018   MPG 102.54 12/30/2017   No results found for: PROLACTIN Lab Results  Component Value Date   CHOL 155 06/05/2018   TRIG 93 06/05/2018   HDL 54 06/05/2018   CHOLHDL 2.9 06/05/2018   VLDL 19 06/05/2018   LDLCALC 82 06/05/2018   [place] 90 12/30/2017    Physical Findings: AIMS:  , ,  ,  ,    CIWA:    COWS:     Musculoskeletal: Strength & Muscle Tone: decreased Gait & Station: normal Patient leans: N/A  Psychiatric Specialty  Exam: Physical Exam  Nursing note and vitals reviewed. Constitutional: She appears well-developed and well-nourished.  HENT:  Head: Normocephalic and atraumatic.  Eyes: Pupils are equal, round, and reactive to light. Conjunctivae are normal.  Neck: Normal range of motion.  Cardiovascular: Regular rhythm and normal heart sounds.  Respiratory: Effort normal.  GI: Soft.  Musculoskeletal: Normal range of motion.  Neurological: She is alert.  Skin: Skin is warm and dry.  Psychiatric: Her affect is blunt. Her speech is delayed. She is slowed. Cognition and memory are impaired. She expresses impulsivity. She expresses no homicidal and no suicidal ideation.    Review of Systems  Constitutional: Negative.   HENT: Negative.   Eyes: Negative.   Respiratory: Negative.   Cardiovascular: Negative.   Gastrointestinal: Negative.   Musculoskeletal: Negative.   Skin: Negative.   Neurological: Negative.   Psychiatric/Behavioral: Negative.     Blood pressure 131/72, pulse 85, temperature 97.8 F (36.6 C), temperature source Oral, resp. rate 18, height 4\' 10"  (1.473 m), weight 72.6 kg, SpO2 94 %.Body mass index is 33.44 kg/m.  General Appearance: Casual  Eye Contact:  Fair  Speech:  Slow  Volume:  Decreased  Mood:  Euthymic  Affect:  Constricted  Thought Process:  Disorganized  Orientation:  Full (Time, Place, and Person)  Thought Content:  Logical  Suicidal Thoughts:  No  Homicidal Thoughts:  No  Memory:  Immediate;   Fair Recent;   Fair Remote;   Fair  Judgement:  Fair  Insight:  Fair  Psychomotor Activity:  Decreased  Concentration:  Concentration: Fair  Recall:  [employer] of Knowledge:  Fair  Language:  Fair  Akathisia:  No  Handed:  Right  AIMS (if indicated):     Assets:  Desire for Improvement Housing  ADL's:  Intact  Cognition:  WNL  Sleep:  Number of Hours: 7.5     Treatment Plan Summary: Daily contact with patient to assess and evaluate symptoms and progress in  treatment, Medication management and Plan Continue current medication.  Supportive counseling and therapy.  Hopeful discharge before too [place], MD 08/22/2018, 4:55 PM

## 2018-08-22 NOTE — Progress Notes (Signed)
Recreation Therapy Notes  Date: 08/22/2018  Time: 9:30 am   Location: Craft room   Behavioral response: N/A   Intervention Topic: Necessities   Discussion/Intervention: Patient did not attend group.   Clinical Observations/Feedback:  Patient did not attend group.   Jessica [name] LRT/CTRS         Jessica [name] 08/22/2018 10:40 AM

## 2018-08-22 NOTE — Progress Notes (Signed)
Patient this morning expressed during assessments anxiety, given PRN medication for comfort and safety. Will continue to monitor for safety.

## 2018-08-22 NOTE — Progress Notes (Signed)
Pt. Endorses anxiety and asking for PRN medications. PRN medication given per MD orders. Will continue to monitor.

## 2018-08-22 NOTE — Progress Notes (Signed)
D: Pt during assessments denies si/hi/avh, able to contract for safety. Pt. Endorses an anxious mood this morning, but throughout day appears to be doing better.   A: Q x 15 minute observation checks were completed for safety. Patient was provided with education.  Patient was given/offered medications per orders. Patient  was encourage to attend groups, participate in unit activities and continue with plan of care. Pt. Chart and plans of care reviewed. Pt. Given support and encouragement.   R: Patient is complaint with medication and unit procedures. Pt. Blood sugars monitored per MD orders. Pt. Eating good, attends all meals. Pt. Frequently observed around the unit socializing.

## 2018-08-22 NOTE — Plan of Care (Signed)
Pt. Self-control improved by demonstration on unit. Pt. Participation improved. Pt. Denies si/hi/avh, able to contract for safety. Pt. Monitored for safety on the unit.   Problem: Coping: Goal: Ability to demonstrate self-control will improve Outcome: Progressing   Problem: Safety: Goal: Periods of time without injury will increase Outcome: Progressing   Problem: Activity: Goal: Interest or engagement in leisure activities will improve Outcome: Progressing

## 2018-08-23 ENCOUNTER — Inpatient Hospital Stay: Payer: Medicare Other

## 2018-08-23 LAB — GLUCOSE, CAPILLARY
Glucose-Capillary: 130 mg/dL — ABNORMAL HIGH (ref 70–99)
Glucose-Capillary: 86 mg/dL (ref 70–99)
Glucose-Capillary: 132 mg/dL — ABNORMAL HIGH (ref 70–99)

## 2018-08-23 MED ORDER — IBUPROFEN 600 MG PO TABS
600.0000 mg | ORAL_TABLET | ORAL | Status: AC
  Administered 2018-08-23: 600 mg via ORAL
  Filled 2018-08-23: qty 1

## 2018-08-23 NOTE — Progress Notes (Signed)
Pt was agitated making verbal threats, using profanity , uncooperative, screaming loudly and disrupting the entire unit. Pt was give an injection of ativan. Collier Bullock Rn

## 2018-08-23 NOTE — Progress Notes (Signed)
Patient escorted to CT by BHT and law enforcement for safety.

## 2018-08-23 NOTE — B[facility] Group Notes (Signed)

## 2018-08-23 NOTE — Progress Notes (Signed)
Reduction of agitations obtained, but will continue to monitor closely for safety.

## 2018-08-23 NOTE — Progress Notes (Addendum)
Pt was in her room. Pt was agitated making verbal threats and using profanity.. She went to swing and punch  the MHT.  She lost her balance and fell back and hit the back of her head.Pt had no bleeding. No one was harmed. She had a golf ball size bruise on the back of the head. Pt was assessed by nurses and assistant director immediately. All fall interventions and protocols were done. Nurring Librarian, academic and family was notified.Collier Bullock RN

## 2018-08-23 NOTE — Progress Notes (Signed)
Patient escorted back from CT by BHT and law enforcement for safety.

## 2018-08-23 NOTE — Progress Notes (Signed)
Recreation Therapy Notes   Date: 08/23/2018  Time: 9:30 am   Location: Craft room   Behavioral response: N/A   Intervention Topic: Stress   Discussion/Intervention: Patient did not attend group.   Clinical Observations/Feedback:  Patient did not attend group.   [name] LRT/CTRS        Jessica [name] 08/23/2018 10:56 AM

## 2018-08-23 NOTE — Tx Team (Signed)
Interdisciplinary Treatment and Diagnostic Plan Update  08/23/2018 Time of Session: 8:30am Jessica [name] MRN: [id number]  Principal Diagnosis: Schizoaffective disorder, bipolar type ([place])  Secondary Diagnoses: Principal Problem:   Schizoaffective disorder, bipolar type ([place]) Active Problems:   Diabetes mellitus without complication ([place])   Intellectual disability   Agitation   Borderline personality disorder ([place])   HTN (hypertension)   Hypothyroidism   Bipolar disorder, unspecified ([place])   Current Medications:  Current Facility-Administered Medications  Medication Dose Route Frequency Provider Last Rate Last Dose  . acetaminophen (TYLENOL) tablet 650 mg  650 mg Oral Q6H PRN Money, Lowry Ram, FNP   650 mg at 08/19/18 0739  . alum & mag hydroxide-simeth (MAALOX/MYLANTA) 200-200-20 MG/5ML suspension 30 mL  30 mL Oral Q4H PRN Money, Lowry Ram, FNP      . atorvastatin (LIPITOR) tablet 10 mg  10 mg Oral q1800 Money, Lowry Ram, FNP   10 mg at 08/22/18 1822  . DULoxetine (CYMBALTA) DR capsule 60 mg  60 mg Oral Daily Clapacs, Madie Reno, MD   60 mg at 08/23/18 0810  . ferrous sulfate tablet 325 mg  325 mg Oral BID WC Money, Lowry Ram, FNP   325 mg at 08/23/18 3825  . gabapentin (NEURONTIN) capsule 600 mg  600 mg Oral TID Clapacs, John T, MD   600 mg at 08/23/18 0811  . hydrOXYzine (ATARAX/VISTARIL) tablet 50 mg  50 mg Oral Q6H PRN Clapacs, Madie Reno, MD   50 mg at 08/20/18 1102  . ibuprofen (ADVIL) tablet 600 mg  600 mg Oral STAT Clapacs, John T, MD      . insulin aspart (novoLOG) injection 0-5 Units  0-5 Units Subcutaneous QHS Money, Lowry Ram, FNP   2 Units at 08/09/18 2142  . insulin aspart (novoLOG) injection 0-9 Units  0-9 Units Subcutaneous TID WC Money, Lowry Ram, FNP   1 Units at 08/23/18 0813  . lamoTRIgine (LAMICTAL) tablet 100 mg  100 mg Oral QHS Clapacs, John T, MD   100 mg at 08/22/18 2200  . levothyroxine (SYNTHROID) tablet 150 mcg  150 mcg Oral Q0600 Money, Lowry Ram, FNP   150 mcg at  08/23/18 0539  . loratadine (CLARITIN) tablet 10 mg  10 mg Oral Daily Clapacs, Madie Reno, MD   10 mg at 08/23/18 7673  . LORazepam (ATIVAN) tablet 2 mg  2 mg Oral Q4H PRN Clapacs, Madie Reno, MD   2 mg at 08/22/18 1623   Or  . LORazepam (ATIVAN) injection 2 mg  2 mg Intramuscular Q4H PRN Clapacs, Madie Reno, MD   2 mg at 08/23/18 0906  . lurasidone (LATUDA) tablet 80 mg  80 mg Oral BID WC Sharma Covert, MD   80 mg at 08/23/18 0814  . magnesium hydroxide (MILK OF MAGNESIA) suspension 30 mL  30 mL Oral Daily PRN Money, Darnelle Maffucci B, FNP      . metFORMIN (GLUCOPHAGE) tablet 1,000 mg  1,000 mg Oral BID WC Sharma Covert, MD   1,000 mg at 08/23/18 0810  . moxifloxacin (VIGAMOX) 0.5 % ophthalmic solution 1 drop  1 drop Right Eye TID Money, Lowry Ram, FNP   1 drop at 08/23/18 4193  . neomycin-bacitracin-polymyxin (NEOSPORIN) ointment   Topical BID Sharma Covert, MD   1 application at 79/02/40 1619  . pantoprazole (PROTONIX) EC tablet 40 mg  40 mg Oral Daily Money, Lowry Ram, FNP   40 mg at 08/23/18 9735  . traZODone (DESYREL) tablet 100 mg  100  mg Oral QHS Money, Darnelle Maffucci B, FNP   100 mg at 08/22/18 2200  . ziprasidone (GEODON) injection 20 mg  20 mg Intramuscular Q12H PRN Clapacs, Madie Reno, MD   20 mg at 08/23/18 0754   PTA Medications: Medications Prior to Admission  Medication Sig Dispense Refill Last Dose  . atorvastatin (LIPITOR) 10 MG tablet Take 1 tablet (10 mg total) by mouth daily. 30 tablet 1   . carvedilol (COREG) 6.25 MG tablet Take 1 tablet (6.25 mg total) by mouth 2 (two) times daily. 60 tablet 1   . divalproex (DEPAKOTE) 250 MG DR tablet Take 1 tablet (250 mg total) by mouth 2 (two) times daily. 60 tablet 0   . ferrous sulfate (FEROSUL) 325 (65 FE) MG tablet Take 1 tablet (325 mg total) by mouth 2 (two) times daily. 60 tablet 1   . gabapentin (NEURONTIN) 600 MG tablet Take 1 tablet (600 mg total) by mouth 3 (three) times daily. 90 tablet 1   . glipiZIDE (GLUCOTROL XL) 5 MG 24 hr tablet Take 5  mg by mouth daily.     . insulin detemir (LEVEMIR) 100 UNIT/ML injection Inject 0.1 mLs (10 Units total) into the skin 2 (two) times daily. (Patient not taking: Reported on 07/21/2018) 10 mL 11   . lamoTRIgine (LAMICTAL) 25 MG tablet Take 1 tablet (25 mg total) by mouth 2 (two) times daily. 60 tablet 0   . levothyroxine (SYNTHROID) 150 MCG tablet Take 1 tablet (150 mcg total) by mouth daily at 6 (six) AM. 30 tablet 1   . lisinopril (ZESTRIL) 10 MG tablet Take 1 tablet (10 mg total) by mouth daily. 30 tablet 1   . loratadine (CLARITIN) 10 MG tablet Take 1 tablet (10 mg total) by mouth daily. 30 tablet 1   . metFORMIN (GLUCOPHAGE) 500 MG tablet Take 1 tablet (500 mg total) by mouth 2 (two) times daily. 60 tablet 1   . moxifloxacin (VIGAMOX) 0.5 % ophthalmic solution Place 1 drop into the right eye 3 (three) times daily. For 5 days 3 mL 0   . paliperidone (INVEGA) 3 MG 24 hr tablet Take 1 tablet (3 mg total) by mouth daily. 30 tablet 0   . pantoprazole (PROTONIX) 40 MG tablet Take 1 tablet (40 mg total) by mouth daily. 30 tablet 1     Patient Stressors: Health problems Legal issue Other: Artist  Patient Strengths: Active sense of humor Religious Affiliation  Treatment Modalities: Medication Management, Group therapy, Case management,  1 to 1 session with clinician, Psychoeducation, Recreational therapy.   Physician Treatment Plan for Primary Diagnosis: Schizoaffective disorder, bipolar type ([place]) Long Term Goal(s): Improvement in symptoms so as ready for discharge Improvement in symptoms so as ready for discharge   Short Term Goals: Ability to demonstrate self-control will improve Ability to identify and develop effective coping behaviors will improve Ability to maintain clinical measurements within normal limits will improve Compliance with prescribed medications will improve  Medication Management: Evaluate patient's response, side effects, and tolerance of medication  regimen.  Therapeutic Interventions: 1 to 1 sessions, Unit Group sessions and Medication administration.  Evaluation of Outcomes: Progressing  Physician Treatment Plan for Secondary Diagnosis: Principal Problem:   Schizoaffective disorder, bipolar type ([place]) Active Problems:   Diabetes mellitus without complication ([place])   Intellectual disability   Agitation   Borderline personality disorder ([facility])   HTN (hypertension)   Hypothyroidism   Bipolar disorder, unspecified ([place])  Long Term Goal(s): Improvement in symptoms so as ready  for discharge Improvement in symptoms so as ready for discharge   Short Term Goals: Ability to demonstrate self-control will improve Ability to identify and develop effective coping behaviors will improve Ability to maintain clinical measurements within normal limits will improve Compliance with prescribed medications will improve     Medication Management: Evaluate patient's response, side effects, and tolerance of medication regimen.  Therapeutic Interventions: 1 to 1 sessions, Unit Group sessions and Medication administration.  Evaluation of Outcomes: Progressing   RN Treatment Plan for Primary Diagnosis: Schizoaffective disorder, bipolar type ([place]) Long Term Goal(s): Knowledge of disease and therapeutic regimen to maintain health will improve  Short Term Goals: Ability to verbalize frustration and anger appropriately will improve, Ability to demonstrate self-control, Ability to participate in decision making will improve, Ability to verbalize feelings will improve and Ability to identify and develop effective coping behaviors will improve  Medication Management: RN will administer medications as ordered by provider, will assess and evaluate patient's response and provide education to patient for prescribed medication. RN will report any adverse and/or side effects to prescribing provider.  Therapeutic Interventions: 1 on 1 counseling sessions,  Psychoeducation, Medication administration, Evaluate responses to treatment, Monitor vital signs and CBGs as ordered, Perform/monitor CIWA, COWS, AIMS and Fall Risk screenings as ordered, Perform wound care treatments as ordered.  Evaluation of Outcomes: Progressing   LCSW Treatment Plan for Primary Diagnosis: Schizoaffective disorder, bipolar type ([place]) Long Term Goal(s): Safe transition to appropriate next level of care at discharge, Engage patient in therapeutic group addressing interpersonal concerns.  Short Term Goals: Engage patient in aftercare planning with referrals and resources, Increase social support, Increase ability to appropriately verbalize feelings, Increase emotional regulation and Facilitate acceptance of mental health diagnosis and concerns  Therapeutic Interventions: Assess for all discharge needs, 1 to 1 time with Social worker, Explore available resources and support systems, Assess for adequacy in community support network, Educate family and significant other(s) on suicide prevention, Complete Psychosocial Assessment, Interpersonal group therapy.  Evaluation of Outcomes: Progressing   Progress in Treatment: Attending groups: Yes. Participating in groups: Yes. Taking medication as prescribed: Yes. Toleration medication: Yes. Family/Significant other contact made: Yes, individual(s) contacted:  Pts legal guardian Fayne Norrie Patient understands diagnosis: Yes. Discussing patient identified problems/goals with staff: Yes. Medical problems stabilized or resolved: Yes. Denies suicidal/homicidal ideation: Yes. Issues/concerns per patient self-inventory: No. Other: none  New problem(s) identified: No, Describe:  none  New Short Term/Long Term Goal(s): medication management for mood stabilization; elimination of SI thoughts; development of comprehensive mental wellness plan.  Patient Goals:  "To find a new group home"  Discharge Plan or Barriers: At this time the  patient is unable to return to her group home due to aggressive behaviors.  Patient will need support in identifying alternative placement.  Update 7/27:  Referral for [facility]) has been re-faxed several times.  CSW team has been in contact with [facility] several times who report each time that they do not have the information and on 7/24 the information was sent for the third time. 08/18/2018-Legal guardian has contacted several group homes, many have said no due to patient history of aggressive behavior. [place] [facility] referral under review by unit nurse director.  Update 08/23/2018: Patient remains on the unit awaiting placement.  Patient was officially placed on the waitlist for [facility] on 08/23/2018.  Patient was disruptive on the unit and observed to be yelling, attempting to hit others, throwing cups of ice on staff and using racial slurs.  Reason for Continuation of Hospitalization: Aggression Anxiety Depression Medication stabilization  Estimated Length of Stay:  TBD  Recreational Therapy: Patient: N/A Patient Goal: Patient will demonstrate no violent or destructive behaviors during recreation therapy group sessions for duration of admission  Attendees: Patient:  08/23/2018 11:39 AM  Physician: Dr. Weber Cooks, MD 08/23/2018 11:39 AM  Nursing:  08/23/2018 11:39 AM  RN Care Manager: 08/23/2018 11:39 AM  Social Worker: Sanjuana Kava LCSW 08/23/2018 11:39 AM  Recreational Therapist:  08/23/2018 11:39 AM  Other:  08/23/2018 11:39 AM  Other:  08/23/2018 11:39 AM  Other: 08/23/2018 11:39 AM    Scribe for Treatment Team: Rozann Lesches, LCSW 08/23/2018 11:39 AM

## 2018-08-23 NOTE — B[facility] Counselor (Signed)
CSW spoke with Allene Pyo [phone] from the patient's previous group home.  CSW inquired if the patient would be able to return.  Joaquim Lai reports that she thinks that the patient would be able to "if her medications are straight". CSW discussed that some of patient's behaviors are related to IDD diagnosis and not mental health.  Joaquim Lai recommended that CSW speak with Lucy Antigua the group home owner.    CSW did speak with Lucy Antigua [phone] who reports that patient can NOT return due to destructive behaviors and patient's threats to harm her roommate.  She reports that patient needs a higher level of care with more structure.  She reports that she is not aware of any potential placement options.  \  Assunta Curtis, MSW, LCSW 08/23/2018 2:23 PM

## 2018-08-23 NOTE — Progress Notes (Signed)
Patient at and around this time (7:30am) visibly agitated, anxious, and irritated. This is as evidence of her throwing stuff in her room into the hallways, screaming at staff utilizing profanities, and posturing towards staff. Pt. At this time requires multiple forms of redirection and encouragement to utilize coping skills, but difficult to redirect and give encouragement. Pt. Offered PRN IM medications to reduce agitations that the patient accepted and was given. Will continue to monitor for safety.

## 2018-08-23 NOTE — Progress Notes (Signed)
Pt was displaying anger in the hallway by the community room while waiting for her breakfast. Staff prompted pt that she can not be rude and talking to staff the way she is and if she is going to continue speaking rude she can go to her room. Pt walked off and went to her room and came back. Then went into the day room and got two cups of ice then came back into the community room and threw both cups at staff Environmental education officer). Then she was escorted to her room and was yelling out racial slurs to staff while going down the hallway.

## 2018-08-23 NOTE — Plan of Care (Signed)
Pt denies depression, anxiety, SI, HI and AVH Pt has been agitated and irritable. Pt was educated on care plan and verbalizes understanding. Collier Bullock RN Problem: Education: Goal: Knowledge of [facility] Education information/materials will improve Outcome: Not Progressing   Problem: Coping: Goal: Ability to verbalize frustrations and anger appropriately will improve Outcome: Not Progressing Goal: Ability to demonstrate self-control will improve Outcome: Not Progressing   Problem: Safety: Goal: Periods of time without injury will increase Outcome: Not Progressing   Problem: Safety: Goal: Ability to redirect hostility and anger into socially appropriate behaviors will improve Outcome: Not Progressing   Problem: Activity: Goal: Interest or engagement in leisure activities will improve Outcome: Not Progressing Goal: Imbalance in normal sleep/wake cycle will improve Outcome: Not Progressing
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
## 2018-08-23 NOTE — Progress Notes (Signed)
CSW spoke with Pamala Hurry RN from [facility] who reported that pt is now on the wait list for [facility] and to send paperwork/notes on pt on Monday and Thursday mornings.  Evalina Field, MSW, LCSW Clinical Social Work 08/23/2018 9:32 AM

## 2018-08-23 NOTE — Progress Notes (Signed)
CSW spoke with pts legal guardian, Tedra Coupe who reported she has no updates at this time regarding pt and is hopeful that pt can get into [facility]. Tedra Coupe reported she has not had any luck with potential placement for pt, but that her agency is not doing any visits so she will not be able to see pt at this time.  Evalina Field, MSW, LCSW Clinical Social Work 08/23/2018 2:14 PM
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
## 2018-08-24 LAB — GLUCOSE, CAPILLARY: Glucose-Capillary: 122 mg/dL — ABNORMAL HIGH (ref 70–99)
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
## 2018-08-24 NOTE — BHH Counselor (Addendum)
CSW spoke with the patient's guardian Jessica [name].  CSW was informed that guardian has contacted 14 group homes and all have denied the patient due to her level of aggression.  CSW infomred htat patient was looking to return to [facility].  Jessica [name] reports that patient can not return to previous group home there, and that she will reach out to [facility] and other group homes.    CSW informed that [facility] has declined the patient at this time.   Jessica [name] reports that she is hoping the patient gets into [facility]. CSW expressed concern that this may not come into fruition as the patient was this week placed on the waitlist and reportedly does not have priority status.    Vonda asked if patient qualifies for Care Coordination and CSW informed that she is not aware of eligibility requirements for Care Coordination.  She reports that she will follow up.     Assunta Curtis, MSW, LCSW 08/24/2018 1:37 PM   CSW notes that a review of the patient's chart indicates that on 7/29 guardian informed fellow CSW that she had contacted 14 group homes.  Assunta Curtis, MSW, LCSW 08/26/2018 12:27 PM

## 2018-08-24 NOTE — Progress Notes (Signed)
Patient is asleep, so her afternoon CBG monitoring and medications were held. This Probation officer will notify MD.
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
## 2018-08-24 NOTE — Progress Notes (Signed)
Patient did not need any evening insulin coverage because her CBG was 80 at 1600.

## 2018-08-24 NOTE — Progress Notes (Signed)
Patient alert and oriented x 4, affect is flat but she brightens upon approach, earler patient was asleep, she refused taking evening medication and also refused her blood glucose check. Patient later came around midnight and asked for trazadone foe sleep. Patient's thoughts are organized, no loud outburst or aggression towards staff, 15 minutes safety checks maintained will continue to monitor.

## 2018-08-24 NOTE — Progress Notes (Signed)
Recreation Therapy Notes  Date: 08/24/2018  Time: 9:30 am   Location: Craft room   Behavioral response: N/A   Intervention Topic: Values  Discussion/Intervention: Patient did not attend group.   Clinical Observations/Feedback:  Patient did not attend group.   Jessica [name] LRT/CTRS        Jessica [name] 08/24/2018 11:27 AM

## 2018-08-24 NOTE — Progress Notes (Signed)
[facility] MD Progress Note  08/24/2018 4:38 PM Jessica [name]  MRN:  [id number] Subjective: Patient had some outburst today but for the most part stayed under control.  Very frustrated at not having a place to go.  No acute suicidal ideation.  Sometimes gets aggressive.  Struck 1 nurse today but not badly. Principal Problem: Schizoaffective disorder, bipolar type ([place]) Diagnosis: Principal Problem:   Schizoaffective disorder, bipolar type ([place]) Active Problems:   Diabetes mellitus without complication ([place])   Intellectual disability   Agitation   Borderline personality disorder (HCC)   HTN (hypertension)   Hypothyroidism   Bipolar disorder, unspecified ([place])  Total Time spent with patient: 30 minutes  Past Psychiatric History: History of chronic behavior problems from developmental disability  Past Medical History:  Past Medical History:  Diagnosis Date  . Anxiety   . Depression   . Diabetes mellitus without complication ([place])   . GERD (gastroesophageal reflux disease)   . Homicidal ideation   . Hypertension   . Hypothyroidism     Past Surgical History:  Procedure Laterality Date  . COLPOSCOPY    . FRACTURE SURGERY    . TRACHEOSTOMY     Family History: History reviewed. No pertinent family history. Family Psychiatric  History: See previous Social History:  Social History   Substance and Sexual Activity  Alcohol Use No  . Frequency: Never     Social History   Substance and Sexual Activity  Drug Use Yes  . Types: Marijuana, "Crack" cocaine   Comment: reports she has not done anything in a long time    Social History   Socioeconomic History  . Marital status: Single    Spouse name: Not on file  . Number of children: Not on file  . Years of education: Not on file  . Highest education level: Not on file  Occupational History  . Not on file  Social Needs  . Financial resource strain: Somewhat hard  . Food insecurity    Worry: Patient refused    Inability: Patient  refused  . Transportation needs    Medical: Patient refused    Non-medical: Patient refused  Tobacco Use  . Smoking status: Former Research scientist (life sciences)  . Smokeless tobacco: Never Used  Substance and Sexual Activity  . Alcohol use: No    Frequency: Never  . Drug use: Yes    Types: Marijuana, "Crack" cocaine    Comment: reports she has not done anything in a long time  . Sexual activity: Not Currently    Birth control/protection: Abstinence  Lifestyle  . Physical activity    Days per week: Patient refused    Minutes per session: Patient refused  . Stress: Rather much  Relationships  . Social Herbalist on phone: Patient refused    Gets together: Patient refused    Attends religious service: Patient refused    Active member of club or organization: Patient refused    Attends meetings of clubs or organizations: Patient refused    Relationship status: Patient refused  Other Topics Concern  . Not on file  Social History Narrative  . Not on file   Additional Social History:                         Sleep: Fair  Appetite:  Fair  Current Medications: Current Facility-Administered Medications  Medication Dose Route Frequency Provider Last Rate Last Dose  . acetaminophen (TYLENOL) tablet 650 mg  650 mg  Oral Q6H PRN Money, Lowry Ram, FNP   650 mg at 08/24/18 1610  . alum & mag hydroxide-simeth (MAALOX/MYLANTA) 200-200-20 MG/5ML suspension 30 mL  30 mL Oral Q4H PRN Money, Lowry Ram, FNP      . atorvastatin (LIPITOR) tablet 10 mg  10 mg Oral q1800 Money, Lowry Ram, FNP   10 mg at 08/23/18 1712  . DULoxetine (CYMBALTA) DR capsule 60 mg  60 mg Oral Daily Waldo Damian, Madie Reno, MD   60 mg at 08/24/18 9604  . ferrous sulfate tablet 325 mg  325 mg Oral BID WC Money, Lowry Ram, FNP   325 mg at 08/24/18 5409  . gabapentin (NEURONTIN) capsule 600 mg  600 mg Oral TID Debra Calabretta T, MD   600 mg at 08/24/18 1441  . hydrOXYzine (ATARAX/VISTARIL) tablet 50 mg  50 mg Oral Q6H PRN Andras Grunewald, Madie Reno,  MD   50 mg at 08/20/18 1102  . insulin aspart (novoLOG) injection 0-5 Units  0-5 Units Subcutaneous QHS Money, Lowry Ram, FNP   2 Units at 08/09/18 2142  . insulin aspart (novoLOG) injection 0-9 Units  0-9 Units Subcutaneous TID WC Money, Lowry Ram, FNP   2 Units at 08/24/18 1440  . lamoTRIgine (LAMICTAL) tablet 100 mg  100 mg Oral QHS Rinaldo Macqueen T, MD   100 mg at 08/22/18 2200  . levothyroxine (SYNTHROID) tablet 150 mcg  150 mcg Oral Q0600 Money, Lowry Ram, FNP   150 mcg at 08/23/18 8119  . loratadine (CLARITIN) tablet 10 mg  10 mg Oral Daily Hetty Linhart, Madie Reno, MD   10 mg at 08/24/18 1478  . LORazepam (ATIVAN) tablet 2 mg  2 mg Oral Q4H PRN Maryfrances Portugal, Madie Reno, MD   2 mg at 08/23/18 2355   Or  . LORazepam (ATIVAN) injection 2 mg  2 mg Intramuscular Q4H PRN Reagyn Facemire, Madie Reno, MD   2 mg at 08/24/18 0907  . lurasidone (LATUDA) tablet 80 mg  80 mg Oral BID WC Sharma Covert, MD   80 mg at 08/24/18 2956  . magnesium hydroxide (MILK OF MAGNESIA) suspension 30 mL  30 mL Oral Daily PRN Money, Darnelle Maffucci B, FNP      . metFORMIN (GLUCOPHAGE) tablet 1,000 mg  1,000 mg Oral BID WC Sharma Covert, MD   1,000 mg at 08/24/18 2130  . moxifloxacin (VIGAMOX) 0.5 % ophthalmic solution 1 drop  1 drop Right Eye TID Money, Lowry Ram, FNP   1 drop at 08/24/18 1441  . neomycin-bacitracin-polymyxin (NEOSPORIN) ointment   Topical BID Sharma Covert, MD      . pantoprazole (PROTONIX) EC tablet 40 mg  40 mg Oral Daily Money, Lowry Ram, FNP   40 mg at 08/24/18 8657  . traZODone (DESYREL) tablet 100 mg  100 mg Oral QHS Money, Travis B, FNP   100 mg at 08/23/18 2353  . ziprasidone (GEODON) injection 20 mg  20 mg Intramuscular Q12H PRN Fairley Copher, Madie Reno, MD   20 mg at 08/24/18 0907    Lab Results:  Results for orders placed or performed during the hospital encounter of 08/04/18 (from the past 48 hour(s))  Glucose, capillary     Status: Abnormal   Collection Time: 08/22/18  8:32 PM  Result Value Ref Range   Glucose-Capillary  102 (H) 70 - 99 mg/dL   Comment 1 Notify RN   Glucose, capillary     Status: Abnormal   Collection Time: 08/23/18  7:04 AM  Result Value Ref Range  Glucose-Capillary 130 (H) 70 - 99 mg/dL   Comment 1 Notify RN   Glucose, capillary     Status: None   Collection Time: 08/23/18 11:38 AM  Result Value Ref Range   Glucose-Capillary 86 70 - 99 mg/dL  Glucose, capillary     Status: Abnormal   Collection Time: 08/23/18  3:58 PM  Result Value Ref Range   Glucose-Capillary 132 (H) 70 - 99 mg/dL   Comment 1 Notify RN   Glucose, capillary     Status: Abnormal   Collection Time: 08/24/18  7:20 AM  Result Value Ref Range   Glucose-Capillary 122 (H) 70 - 99 mg/dL   Comment 1 Notify RN     Blood Alcohol level:  Lab Results  Component Value Date   ETH <10 07/19/2018   ETH <10 88/41/6606    Metabolic Disorder Labs: Lab Results  Component Value Date   HGBA1C 5.3 06/05/2018   MPG 105.41 06/05/2018   MPG 102.54 12/30/2017   No results found for: PROLACTIN Lab Results  Component Value Date   CHOL 155 06/05/2018   TRIG 93 06/05/2018   HDL 54 06/05/2018   CHOLHDL 2.9 06/05/2018   VLDL 19 06/05/2018   LDLCALC 82 06/05/2018   [place] 90 12/30/2017    Physical Findings: AIMS:  , ,  ,  ,    CIWA:    COWS:     Musculoskeletal: Strength & Muscle Tone: within normal limits Gait & Station: normal Patient leans: N/A  Psychiatric Specialty Exam: Physical Exam  Nursing note and vitals reviewed. Constitutional: She appears well-developed and well-nourished.  HENT:  Head: Normocephalic and atraumatic.  Eyes: Pupils are equal, round, and reactive to light. Conjunctivae are normal.  Neck: Normal range of motion.  Cardiovascular: Regular rhythm and normal heart sounds.  Respiratory: Effort normal. No respiratory distress.  GI: Soft.  Musculoskeletal: Normal range of motion.  Neurological: She is alert.  Skin: Skin is warm and dry.  Psychiatric: Her affect is labile. Her speech is  tangential. She is agitated. Cognition and memory are impaired. She expresses impulsivity. She expresses no homicidal and no suicidal ideation.    Review of Systems  Constitutional: Negative.   HENT: Negative.   Eyes: Negative.   Respiratory: Negative.   Cardiovascular: Negative.   Gastrointestinal: Negative.   Musculoskeletal: Negative.   Skin: Negative.   Neurological: Negative.   Psychiatric/Behavioral: Negative for depression, hallucinations, substance abuse and suicidal ideas. The patient is nervous/anxious.     Blood pressure 132/70, pulse 81, temperature 98.1 F (36.7 C), temperature source Oral, resp. rate 16, height 4\' 10"  (1.473 m), weight 72.6 kg, SpO2 93 %.Body mass index is 33.44 kg/m.  General Appearance: Casual  Eye Contact:  Fair  Speech:  Garbled and Slow  Volume:  Decreased  Mood:  Angry and Irritable  Affect:  Inappropriate  Thought Process:  Disorganized  Orientation:  Full (Time, Place, and Person)  Thought Content:  Illogical and Paranoid Ideation  Suicidal Thoughts:  No  Homicidal Thoughts:  No  Memory:  Immediate;   Fair Recent;   Poor Remote;   Poor  Judgement:  Impaired  Insight:  Shallow  Psychomotor Activity:  Decreased  Concentration:  Concentration: Poor  Recall:  Poor  Fund of Knowledge:  Poor  Language:  Poor  Akathisia:  No  Handed:  Right  AIMS (if indicated):     Assets:  Desire for Improvement  ADL's:  Impaired  Cognition:  Impaired,  Moderate  Sleep:  Number of Hours: 8.15     Treatment Plan Summary: Daily contact with patient to assess and evaluate symptoms and progress in treatment, Medication management and Plan Supportive therapy and praise for her attempts to control her behavior.  No clear need for any change to medication.  Still looking for a discharge plan  Alethia Berthold, MD 08/24/2018, 4:38 PM
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
## 2018-08-24 NOTE — Plan of Care (Signed)
  Problem: Coping: Goal: Ability to verbalize frustrations and anger appropriately will improve Outcome: Progressing  Patient verbalized frustration about not been accepted in her old group home.

## 2018-08-24 NOTE — BHH Group Notes (Signed)
LCSW Group Therapy Note  08/24/2018 11:35 AM  Type of Therapy/Topic:  Group Therapy:  Emotion Regulation  Participation Level:  Did Not Attend   Description of Group:   The purpose of this group is to assist patients in learning to regulate negative emotions and experience positive emotions. Patients will be guided to discuss ways in which they have been vulnerable to their negative emotions. These vulnerabilities will be juxtaposed with experiences of positive emotions or situations, and patients will be challenged to use positive emotions to combat negative ones. Special emphasis will be placed on coping with negative emotions in conflict situations, and patients will process healthy conflict resolution skills.  Therapeutic Goals: 1. Patient will identify two positive emotions or experiences to reflect on in order to balance out negative emotions 2. Patient will label two or more emotions that they find the most difficult to experience 3. Patient will demonstrate positive conflict resolution skills through discussion and/or role plays  Summary of Patient Progress: x   Therapeutic Modalities:   Cognitive Behavioral Therapy Feelings Identification Dialectical Behavioral Therapy   Evalina Field, MSW, LCSW Clinical Social Work 08/24/2018 11:35 AM

## 2018-08-24 NOTE — BHH Counselor (Signed)
CSW called [facility] to inform that patient remains on the unit and in need of placement. CSW received confirmation that fax was successful.  Assunta Curtis, MSW, LCSW 08/24/2018 11:19 AM

## 2018-08-25 LAB — GLUCOSE, CAPILLARY
Glucose-Capillary: 155 mg/dL — ABNORMAL HIGH (ref 70–99)
Glucose-Capillary: 80 mg/dL (ref 70–99)
Glucose-Capillary: 92 mg/dL (ref 70–99)
Glucose-Capillary: 134 mg/dL — ABNORMAL HIGH (ref 70–99)
Glucose-Capillary: 164 mg/dL — ABNORMAL HIGH (ref 70–99)
Glucose-Capillary: 134 mg/dL — ABNORMAL HIGH (ref 70–99)
Glucose-Capillary: 192 mg/dL — ABNORMAL HIGH (ref 70–99)

## 2018-08-25 MED ORDER — GABAPENTIN 300 MG PO CAPS
900.0000 mg | ORAL_CAPSULE | Freq: Three times a day (TID) | ORAL | Status: DC
  Administered 2018-08-25 – 2018-09-19 (×72): 900 mg via ORAL
  Filled 2018-08-25 (×72): qty 3

## 2018-08-25 MED ORDER — LAMOTRIGINE 25 MG PO TABS
150.0000 mg | ORAL_TABLET | Freq: Every day | ORAL | Status: DC
  Administered 2018-08-25 – 2018-08-27 (×3): 150 mg via ORAL
  Filled 2018-08-25 (×4): qty 1
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
## 2018-08-25 NOTE — Progress Notes (Signed)
D - Patient was in her room upon arrival to the unit. Patient was pleasant during assessment and medication administration. Patient denies SI/HI/AVH, pain, anxiety and depression with this Probation officer. Patient was needy and continually coming up to the nurses station this evening. Patient didn't have any outbursts this evening and was compliant with procedures on the unit.   A - Patient compliant with medication administration per MD orders and procedures on the unit. Patient given education. Patient given support and encouragement to be active in her treatment plan. Patient informed to let staff know if there are any issues or problems on the unit. Patient observed interacting appropriately with staff and peers on the unit by this Probation officer.   R - Patient being monitored Q 15 minutes for safety per unit protocol. Patient remains safe on the unit.

## 2018-08-25 NOTE — BHH Group Notes (Signed)
LCSW Group Therapy Note  08/25/2018 1:00 PM  Type of Therapy/Topic:  Group Therapy:  Balance in Life  Participation Level:  Did Not Attend  Description of Group:    This group will address the concept of balance and how it feels and looks when one is unbalanced. Patients will be encouraged to process areas in their lives that are out of balance and identify reasons for remaining unbalanced. Facilitators will guide patients in utilizing problem-solving interventions to address and correct the stressor making their life unbalanced. Understanding and applying boundaries will be explored and addressed for obtaining and maintaining a balanced life. Patients will be encouraged to explore ways to assertively make their unbalanced needs known to significant others in their lives, using other group members and facilitator for support and feedback.  Therapeutic Goals: 1. Patient will identify two or more emotions or situations they have that consume much of in their lives. 2. Patient will identify signs/triggers that life has become out of balance:  3. Patient will identify two ways to set boundaries in order to achieve balance in their lives:  4. Patient will demonstrate ability to communicate their needs through discussion and/or role plays  Summary of Patient Progress: X  Therapeutic Modalities:   Cognitive Behavioral Therapy Solution-Focused Therapy Assertiveness Training  Assunta Curtis MSW, LCSW 08/25/2018 11:46 AM

## 2018-08-25 NOTE — Progress Notes (Signed)
D - Patient was in her room upon arrival to the unit. Patient was pleasant during assessment and medication administration. Patient denies SI/HI/AVH, pain, anxiety and depression with this Probation officer. Patient was calm and cooperative this evening until 2345, patient started yelling on the unit, "I don't want to be here any more, I fucking hate it. I hate them in there." Patient was referring to the techs. Patient was redirected several times before medications were administered per MD orders.   A - Patient compliant with medication administration per MD orders and procedures on the unit. Patient given education. Patient given support and encouragement to be active in her treatment plan. Patient informed to let staff know if there are any issues or problems on the unit. Patient observed interacting appropriately with staff and peers on the unit by this Probation officer.   R - Patient being monitored Q 15 minutes for safety per unit protocol. Patient remains safe on the unit.

## 2018-08-25 NOTE — Plan of Care (Signed)
Patient was redirectable this evening and without outbursts. Patient continues to be on the edge of having an outburst at any time.   Problem: Safety: Goal: Ability to redirect hostility and anger into socially appropriate behaviors will improve Outcome: Not Progressing

## 2018-08-25 NOTE — BHH Counselor (Signed)
CSW was approached by patient at the nurses station.  Patient wanted an update on her group home placement and if CSW had spoke with guardian.   CSW informed that she has not spoke with guardian today, however, at last discussion on 08/24/2018 guardian had contacted several group homes with none willing to accept the patient due to past behaviors. Patient became upset and stated "I am tired of being here. I may as well just leave."  CSW informed patient that team is working hard to identify placement and encouraged patient to remain hopeful and patient.    CSW notes that patient was interrupted twice by another patient who wanted CSW's attention.  CSW redirected the other patient each time, however, this patient became upset and stormed off and began to yell in her room doorway.  CSW approached patient and offered to continue conversation, however, patient was not willing to do so if the door remained open.  CSW informed that the door would not be closed.  CSW informed patient that no one was in the hall and patient agreed to talk further.    Patient stated "I should just go live on the street."  CSW again began to discuss that the team is working on placement for her.  Patient became upset and asked CSW to leave. As CSW left room patient could be overheard calling CSW a "b*tch".   CSW could hear patient yelling as she walked away.    Assunta Curtis, MSW, LCSW 08/25/2018 1:00 PM

## 2018-08-25 NOTE — Plan of Care (Signed)
Patient continues to have outbursts on the unit, disrupting the unit and her peers.   Problem: Education: Goal: Knowledge of [place] [facility] Education information/materials will improve Outcome: Not Progressing   Problem: Coping: Goal: Ability to verbalize frustrations and anger appropriately will improve Outcome: Not Progressing Goal: Ability to demonstrate self-control will improve Outcome: Not Progressing   Problem: Safety: Goal: Periods of time without injury will increase Outcome: Not Progressing

## 2018-08-25 NOTE — Plan of Care (Signed)
D- Patient alert and oriented. Patient presents in a pleasant mood on assessment stating that she slept good last night, "after I got the shots I slept great". Patient denies any signs/symptoms of anxiety, however, she endorsed depression, stating that "just a little bit about what happened last night". Patient also denies SI, HI, AVH, and pain at this time. Patient's goal for today is to "talk to Education officer, museum, doctor, and go to group", in which she will go "outside" in order to accomplish her goal.  A- Scheduled medications administered to patient, per MD orders. Support and encouragement provided.  Routine safety checks conducted every 15 minutes.  Patient informed to notify staff with problems or concerns.  R- No adverse drug reactions noted. Patient contracts for safety at this time. Patient compliant with medications and treatment plan. Patient receptive, calm, and cooperative. Patient interacts well with others on the unit.  Patient remains safe at this time.  Problem: Education: Goal: Knowledge of [place] [facility] Education information/materials will improve Outcome: Progressing   Problem: Coping: Goal: Ability to verbalize frustrations and anger appropriately will improve Outcome: Progressing Goal: Ability to demonstrate self-control will improve Outcome: Progressing   Problem: Safety: Goal: Periods of time without injury will increase Outcome: Progressing   Problem: Safety: Goal: Ability to redirect hostility and anger into socially appropriate behaviors will improve Outcome: Progressing   Problem: Activity: Goal: Interest or engagement in leisure activities will improve Outcome: Progressing Goal: Imbalance in normal sleep/wake cycle will improve Outcome: Progressing

## 2018-08-25 NOTE — Progress Notes (Signed)
Patient was given her green eyeglasses and black eyeglass case that was in her belongings in her locker.

## 2018-08-25 NOTE — Progress Notes (Signed)
Patient was at the nurse's station speaking with the social worker asking about her placement and it was told to patient that they are still working on it. Another patient came up to try and speak with the social worker as well, in which this is what set patient off. Patient stormed off from the nurse's station and hollered out "god damn it" and slammed her room door.

## 2018-08-25 NOTE — Progress Notes (Signed)
[facility] MD Progress Note  08/25/2018 5:19 PM Jessica [name]  MRN:  [id number] Subjective: Patient seen today for follow-up.  She had an episode again this afternoon of becoming agitated and angry and starting to act out.  Required a lot of redirection.  During her times of acting out she will make bizarre accusations which she later admitted were all lies about staff.  Generally keeping herself together although the littlest thing gets her frustrated. Principal Problem: Schizoaffective disorder, bipolar type ([place]) Diagnosis: Principal Problem:   Schizoaffective disorder, bipolar type ([place]) Active Problems:   Diabetes mellitus without complication ([place])   Intellectual disability   Agitation   Borderline personality disorder (HCC)   HTN (hypertension)   Hypothyroidism   Bipolar disorder, unspecified ([place])  Total Time spent with patient: 30 minutes  Past Psychiatric History: Long history of mood instability acting out behavior  Past Medical History:  Past Medical History:  Diagnosis Date  . Anxiety   . Depression   . Diabetes mellitus without complication ([place])   . GERD (gastroesophageal reflux disease)   . Homicidal ideation   . Hypertension   . Hypothyroidism     Past Surgical History:  Procedure Laterality Date  . COLPOSCOPY    . FRACTURE SURGERY    . TRACHEOSTOMY     Family History: History reviewed. No pertinent family history. Family Psychiatric  History: See previous Social History:  Social History   Substance and Sexual Activity  Alcohol Use No  . Frequency: Never     Social History   Substance and Sexual Activity  Drug Use Yes  . Types: Marijuana, "Crack" cocaine   Comment: reports she has not done anything in a long time    Social History   Socioeconomic History  . Marital status: Single    Spouse name: Not on file  . Number of children: Not on file  . Years of education: Not on file  . Highest education level: Not on file  Occupational History  . Not  on file  Social Needs  . Financial resource strain: Somewhat hard  . Food insecurity    Worry: Patient refused    Inability: Patient refused  . Transportation needs    Medical: Patient refused    Non-medical: Patient refused  Tobacco Use  . Smoking status: Former Research scientist (life sciences)  . Smokeless tobacco: Never Used  Substance and Sexual Activity  . Alcohol use: No    Frequency: Never  . Drug use: Yes    Types: Marijuana, "Crack" cocaine    Comment: reports she has not done anything in a long time  . Sexual activity: Not Currently    Birth control/protection: Abstinence  Lifestyle  . Physical activity    Days per week: Patient refused    Minutes per session: Patient refused  . Stress: Rather much  Relationships  . Social Herbalist on phone: Patient refused    Gets together: Patient refused    Attends religious service: Patient refused    Active member of club or organization: Patient refused    Attends meetings of clubs or organizations: Patient refused    Relationship status: Patient refused  Other Topics Concern  . Not on file  Social History Narrative  . Not on file   Additional Social History:                         Sleep: Fair  Appetite:  Fair  Current Medications: Current  Facility-Administered Medications  Medication Dose Route Frequency Provider Last Rate Last Dose  . acetaminophen (TYLENOL) tablet 650 mg  650 mg Oral Q6H PRN Money, Lowry Ram, FNP   650 mg at 08/25/18 2831  . alum & mag hydroxide-simeth (MAALOX/MYLANTA) 200-200-20 MG/5ML suspension 30 mL  30 mL Oral Q4H PRN Money, Lowry Ram, FNP      . atorvastatin (LIPITOR) tablet 10 mg  10 mg Oral q1800 Money, Lowry Ram, FNP   10 mg at 08/25/18 1706  . DULoxetine (CYMBALTA) DR capsule 60 mg  60 mg Oral Daily Clapacs, Madie Reno, MD   60 mg at 08/25/18 0814  . ferrous sulfate tablet 325 mg  325 mg Oral BID WC Money, Lowry Ram, FNP   325 mg at 08/25/18 1707  . gabapentin (NEURONTIN) capsule 900 mg  900 mg  Oral TID Clapacs, John T, MD   900 mg at 08/25/18 1705  . hydrOXYzine (ATARAX/VISTARIL) tablet 50 mg  50 mg Oral Q6H PRN Clapacs, Madie Reno, MD   50 mg at 08/20/18 1102  . insulin aspart (novoLOG) injection 0-5 Units  0-5 Units Subcutaneous QHS Money, Lowry Ram, FNP   2 Units at 08/09/18 2142  . insulin aspart (novoLOG) injection 0-9 Units  0-9 Units Subcutaneous TID WC Money, Lowry Ram, FNP   1 Units at 08/25/18 1705  . lamoTRIgine (LAMICTAL) tablet 150 mg  150 mg Oral QHS Clapacs, John T, MD      . levothyroxine (SYNTHROID) tablet 150 mcg  150 mcg Oral Q0600 Money, Lowry Ram, FNP   150 mcg at 08/25/18 0602  . loratadine (CLARITIN) tablet 10 mg  10 mg Oral Daily Clapacs, Madie Reno, MD   10 mg at 08/25/18 0814  . LORazepam (ATIVAN) tablet 2 mg  2 mg Oral Q4H PRN Clapacs, Madie Reno, MD   2 mg at 08/23/18 2355   Or  . LORazepam (ATIVAN) injection 2 mg  2 mg Intramuscular Q4H PRN Clapacs, Madie Reno, MD   2 mg at 08/25/18 1359  . lurasidone (LATUDA) tablet 80 mg  80 mg Oral BID WC Sharma Covert, MD   80 mg at 08/25/18 1705  . magnesium hydroxide (MILK OF MAGNESIA) suspension 30 mL  30 mL Oral Daily PRN Money, Darnelle Maffucci B, FNP      . metFORMIN (GLUCOPHAGE) tablet 1,000 mg  1,000 mg Oral BID WC Sharma Covert, MD   1,000 mg at 08/25/18 1705  . moxifloxacin (VIGAMOX) 0.5 % ophthalmic solution 1 drop  1 drop Right Eye TID Money, Lowry Ram, FNP   1 drop at 08/25/18 1707  . neomycin-bacitracin-polymyxin (NEOSPORIN) ointment   Topical BID Sharma Covert, MD      . pantoprazole (PROTONIX) EC tablet 40 mg  40 mg Oral Daily Money, Lowry Ram, FNP   40 mg at 08/25/18 5176  . traZODone (DESYREL) tablet 100 mg  100 mg Oral QHS Money, Travis B, FNP   100 mg at 08/24/18 2142  . ziprasidone (GEODON) injection 20 mg  20 mg Intramuscular Q12H PRN Clapacs, Madie Reno, MD   20 mg at 08/25/18 1358    Lab Results:  Results for orders placed or performed during the hospital encounter of 08/04/18 (from the past 48 hour(s))  Glucose,  capillary     Status: Abnormal   Collection Time: 08/24/18  7:20 AM  Result Value Ref Range   Glucose-Capillary 122 (H) 70 - 99 mg/dL   Comment 1 Notify RN   Glucose, capillary  Status: Abnormal   Collection Time: 08/24/18  1:45 PM  Result Value Ref Range   Glucose-Capillary 155 (H) 70 - 99 mg/dL  Glucose, capillary     Status: None   Collection Time: 08/24/18  4:00 PM  Result Value Ref Range   Glucose-Capillary 80 70 - 99 mg/dL  Glucose, capillary     Status: None   Collection Time: 08/24/18  8:26 PM  Result Value Ref Range   Glucose-Capillary 92 70 - 99 mg/dL  Glucose, capillary     Status: Abnormal   Collection Time: 08/25/18  7:14 AM  Result Value Ref Range   Glucose-Capillary 134 (H) 70 - 99 mg/dL   Comment 1 Notify RN   Glucose, capillary     Status: Abnormal   Collection Time: 08/25/18 11:33 AM  Result Value Ref Range   Glucose-Capillary 164 (H) 70 - 99 mg/dL  Glucose, capillary     Status: Abnormal   Collection Time: 08/25/18  4:05 PM  Result Value Ref Range   Glucose-Capillary 134 (H) 70 - 99 mg/dL    Blood Alcohol level:  Lab Results  Component Value Date   ETH <10 07/19/2018   ETH <10 54/62/7035    Metabolic Disorder Labs: Lab Results  Component Value Date   HGBA1C 5.3 06/05/2018   MPG 105.41 06/05/2018   MPG 102.54 12/30/2017   No results found for: PROLACTIN Lab Results  Component Value Date   CHOL 155 06/05/2018   TRIG 93 06/05/2018   HDL 54 06/05/2018   CHOLHDL 2.9 06/05/2018   VLDL 19 06/05/2018   LDLCALC 82 06/05/2018   [place] 90 12/30/2017    Physical Findings: AIMS:  , ,  ,  ,    CIWA:    COWS:     Musculoskeletal: Strength & Muscle Tone: within normal limits Gait & Station: normal Patient leans: N/A  Psychiatric Specialty Exam: Physical Exam  Nursing note and vitals reviewed. Constitutional: She appears well-developed and well-nourished.  HENT:  Head: Normocephalic and atraumatic.  Eyes: Pupils are equal, round, and  reactive to light. Conjunctivae are normal.  Neck: Normal range of motion.  Cardiovascular: Regular rhythm and normal heart sounds.  Respiratory: Effort normal.  GI: Soft.  Musculoskeletal: Normal range of motion.  Neurological: She is alert.  Skin: Skin is warm and dry.  Psychiatric: Her affect is labile and inappropriate. Her speech is tangential. She is agitated and aggressive. Thought content is paranoid. Cognition and memory are impaired. She expresses impulsivity and inappropriate judgment.    Review of Systems  Constitutional: Negative.   HENT: Negative.   Eyes: Negative.   Respiratory: Negative.   Cardiovascular: Negative.   Gastrointestinal: Negative.   Musculoskeletal: Negative.   Skin: Negative.   Neurological: Negative.   Psychiatric/Behavioral: Positive for memory loss. Negative for depression, hallucinations, substance abuse and suicidal ideas. The patient is nervous/anxious.     Blood pressure 117/74, pulse 96, temperature 98.3 F (36.8 C), temperature source Oral, resp. rate 17, height 4\' 10"  (1.473 m), weight 72.6 kg, SpO2 95 %.Body mass index is 33.44 kg/m.  General Appearance: Casual  Eye Contact:  Fair  Speech:  Slow  Volume:  Decreased  Mood:  Irritable  Affect:  Inappropriate and Labile  Thought Process:  Disorganized  Orientation:  Full (Time, Place, and Person)  Thought Content:  Illogical, Paranoid Ideation, Rumination and Tangential  Suicidal Thoughts:  No  Homicidal Thoughts:  No  Memory:  Immediate;   Fair Recent;   Fair Remote;  Fair  Judgement:  Impaired  Insight:  Lacking  Psychomotor Activity:  Increased and Restlessness  Concentration:  Concentration: Poor  Recall:  Poor  Fund of Knowledge:  Poor  Language:  Fair  Akathisia:  No  Handed:  Right  AIMS (if indicated):     Assets:  Desire for Improvement  ADL's:  Impaired  Cognition:  Impaired,  Moderate  Sleep:  Number of Hours: 5.75     Treatment Plan Summary: Daily contact  with patient to assess and evaluate symptoms and progress in treatment, Medication management and Plan We went over her medicine and made a few adjustments including increasing the Lamictal and gabapentin.  No change to the current Geodon.  Spent quite a bit of time with her doing some cognitive therapy and supportive counseling to keep her from blowing her stack.  We are still looking for some kind of placement plan  Alethia Berthold, MD 08/25/2018, 5:19 PM

## 2018-08-26 LAB — GLUCOSE, CAPILLARY
Glucose-Capillary: 190 mg/dL — ABNORMAL HIGH (ref 70–99)
Glucose-Capillary: 115 mg/dL — ABNORMAL HIGH (ref 70–99)
Glucose-Capillary: 117 mg/dL — ABNORMAL HIGH (ref 70–99)

## 2018-08-26 NOTE — [facility] Counselor (Signed)
CSW was approached by patient who started shouting and calling me "b*tch", "stupid son of a b*tch".    Patient later walked to the nurses station where this CSW was staffing another patient. Patient knocked on the door and when addressed by a nurse the patient pointed at this CSW and stated "I'm going to kill her.  I am going to kill her!"  Patient left the nurses station and walked to her room where she grabbed her eyeglass case and threw it at this CSW.    Patient was placed on the locked unit.  Patient has been aggressive, throwing items, shouting in the halls, making threats and screaming. Patient is disruptive to the milieu.       Assunta Curtis, MSW, LCSW 08/26/2018 3:21 PM

## 2018-08-26 NOTE — [facility] Counselor (Signed)
CSW met with the patient at her request.  Patient reports that she wanted to know any updates from the guardian.  CSW reported that guardian has sent information to 50+ homes and will be emailing the list to this CSW.  CSW informed that CSW will continue to look for placement and is awaiting the list to avoid calling the group homes twice.    Patient became upset and stated "this is all Joaquim Lai fault".  CSW attempted to redirect patient and reframed.  CSW pointed out that patient's aggressive behaviors have been a barrier, however, patient can choose to use her coping skills like reading and coloring to try and change the behavior and show that she is working to change her behaviors.  Patient refused to accept that her role in placement behaviors.   Patient stated that "I don't understand".  CSW attempted to reframe and re-explain.  Patient became upset and walked off mid-sentence.    Assunta Curtis, MSW, LCSW 08/26/2018 3:17 PM

## 2018-08-26 NOTE — Progress Notes (Signed)
Patient reports been apprehensive but contract for safety of self , took her medications , and remained calm at this time, denies any SI/HI/AVH , sleep is intermittent and not continuous , support and education is provided no distress noted.

## 2018-08-26 NOTE — Progress Notes (Signed)
[facility] MD Progress Note  08/26/2018 1:47 PM Jessica [name]  MRN:  [id number] Subjective: Follow-up for this woman with schizoaffective disorder and developmental disability.  So far as of this writing she has not been showing any bad behavior today.  He has told everyone that she is making a real effort to not lose her temper and not engage in any of the obvious bad behavior.  She says her mood is feeling okay.  She came in and talk with me for a while today and kept her temper and carried on a lucid conversation.  No evidence of any obvious psychosis.  Blood pressure was high this morning but has been stable up until this point.  No other physical complaints. Principal Problem: Schizoaffective disorder, bipolar type ([place]) Diagnosis: Principal Problem:   Schizoaffective disorder, bipolar type ([place]) Active Problems:   Diabetes mellitus without complication ([place])   Intellectual disability   Agitation   Borderline personality disorder (HCC)   HTN (hypertension)   Hypothyroidism   Bipolar disorder, unspecified ([place])  Total Time spent with patient: 30 minutes  Past Psychiatric History: Patient has a lifelong history of developmental disorder  Past Medical History:  Past Medical History:  Diagnosis Date  . Anxiety   . Depression   . Diabetes mellitus without complication ([place])   . GERD (gastroesophageal reflux disease)   . Homicidal ideation   . Hypertension   . Hypothyroidism     Past Surgical History:  Procedure Laterality Date  . COLPOSCOPY    . FRACTURE SURGERY    . TRACHEOSTOMY     Family History: History reviewed. No pertinent family history. Family Psychiatric  History: See previous Social History:  Social History   Substance and Sexual Activity  Alcohol Use No  . Frequency: Never     Social History   Substance and Sexual Activity  Drug Use Yes  . Types: Marijuana, "Crack" cocaine   Comment: reports she has not done anything in a long time    Social History    Socioeconomic History  . Marital status: Single    Spouse name: Not on file  . Number of children: Not on file  . Years of education: Not on file  . Highest education level: Not on file  Occupational History  . Not on file  Social Needs  . Financial resource strain: Somewhat hard  . Food insecurity    Worry: Patient refused    Inability: Patient refused  . Transportation needs    Medical: Patient refused    Non-medical: Patient refused  Tobacco Use  . Smoking status: Former Research scientist (life sciences)  . Smokeless tobacco: Never Used  Substance and Sexual Activity  . Alcohol use: No    Frequency: Never  . Drug use: Yes    Types: Marijuana, "Crack" cocaine    Comment: reports she has not done anything in a long time  . Sexual activity: Not Currently    Birth control/protection: Abstinence  Lifestyle  . Physical activity    Days per week: Patient refused    Minutes per session: Patient refused  . Stress: Rather much  Relationships  . Social Herbalist on phone: Patient refused    Gets together: Patient refused    Attends religious service: Patient refused    Active member of club or organization: Patient refused    Attends meetings of clubs or organizations: Patient refused    Relationship status: Patient refused  Other Topics Concern  . Not on file  Social History Narrative  . Not on file   Additional Social History:                         Sleep: Fair  Appetite:  Fair  Current Medications: Current Facility-Administered Medications  Medication Dose Route Frequency Provider Last Rate Last Dose  . acetaminophen (TYLENOL) tablet 650 mg  650 mg Oral Q6H PRN Money, Lowry Ram, FNP   650 mg at 08/25/18 6712  . alum & mag hydroxide-simeth (MAALOX/MYLANTA) 200-200-20 MG/5ML suspension 30 mL  30 mL Oral Q4H PRN Money, Lowry Ram, FNP      . atorvastatin (LIPITOR) tablet 10 mg  10 mg Oral q1800 Money, Lowry Ram, FNP   10 mg at 08/25/18 1706  . DULoxetine (CYMBALTA) DR  capsule 60 mg  60 mg Oral Daily Clapacs, Madie Reno, MD   60 mg at 08/26/18 0750  . ferrous sulfate tablet 325 mg  325 mg Oral BID WC Money, Lowry Ram, FNP   325 mg at 08/26/18 0750  . gabapentin (NEURONTIN) capsule 900 mg  900 mg Oral TID Clapacs, John T, MD   900 mg at 08/26/18 1128  . hydrOXYzine (ATARAX/VISTARIL) tablet 50 mg  50 mg Oral Q6H PRN Clapacs, Madie Reno, MD   50 mg at 08/20/18 1102  . insulin aspart (novoLOG) injection 0-5 Units  0-5 Units Subcutaneous QHS Money, Lowry Ram, FNP   2 Units at 08/09/18 2142  . insulin aspart (novoLOG) injection 0-9 Units  0-9 Units Subcutaneous TID WC Money, Lowry Ram, FNP   2 Units at 08/26/18 0749  . lamoTRIgine (LAMICTAL) tablet 150 mg  150 mg Oral QHS Clapacs, Madie Reno, MD   150 mg at 08/25/18 2119  . levothyroxine (SYNTHROID) tablet 150 mcg  150 mcg Oral Q0600 Money, Lowry Ram, FNP   150 mcg at 08/26/18 0557  . loratadine (CLARITIN) tablet 10 mg  10 mg Oral Daily Clapacs, Madie Reno, MD   10 mg at 08/26/18 0750  . LORazepam (ATIVAN) tablet 2 mg  2 mg Oral Q4H PRN Clapacs, Madie Reno, MD   2 mg at 08/23/18 2355   Or  . LORazepam (ATIVAN) injection 2 mg  2 mg Intramuscular Q4H PRN Clapacs, Madie Reno, MD   2 mg at 08/25/18 1359  . lurasidone (LATUDA) tablet 80 mg  80 mg Oral BID WC Sharma Covert, MD   80 mg at 08/26/18 0750  . magnesium hydroxide (MILK OF MAGNESIA) suspension 30 mL  30 mL Oral Daily PRN Money, Darnelle Maffucci B, FNP      . metFORMIN (GLUCOPHAGE) tablet 1,000 mg  1,000 mg Oral BID WC Sharma Covert, MD   1,000 mg at 08/26/18 0750  . moxifloxacin (VIGAMOX) 0.5 % ophthalmic solution 1 drop  1 drop Right Eye TID Money, Lowry Ram, FNP   1 drop at 08/26/18 1146  . neomycin-bacitracin-polymyxin (NEOSPORIN) ointment   Topical BID Sharma Covert, MD      . pantoprazole (PROTONIX) EC tablet 40 mg  40 mg Oral Daily Money, Lowry Ram, FNP   40 mg at 08/26/18 0750  . traZODone (DESYREL) tablet 100 mg  100 mg Oral QHS Money, Travis B, FNP   100 mg at 08/25/18 2119  .  ziprasidone (GEODON) injection 20 mg  20 mg Intramuscular Q12H PRN Clapacs, Madie Reno, MD   20 mg at 08/25/18 1358    Lab Results:  Results for orders placed or performed during the hospital  encounter of 08/04/18 (from the past 48 hour(s))  Glucose, capillary     Status: None   Collection Time: 08/24/18  4:00 PM  Result Value Ref Range   Glucose-Capillary 80 70 - 99 mg/dL  Glucose, capillary     Status: None   Collection Time: 08/24/18  8:26 PM  Result Value Ref Range   Glucose-Capillary 92 70 - 99 mg/dL  Glucose, capillary     Status: Abnormal   Collection Time: 08/25/18  7:14 AM  Result Value Ref Range   Glucose-Capillary 134 (H) 70 - 99 mg/dL   Comment 1 Notify RN   Glucose, capillary     Status: Abnormal   Collection Time: 08/25/18 11:33 AM  Result Value Ref Range   Glucose-Capillary 164 (H) 70 - 99 mg/dL  Glucose, capillary     Status: Abnormal   Collection Time: 08/25/18  4:05 PM  Result Value Ref Range   Glucose-Capillary 134 (H) 70 - 99 mg/dL  Glucose, capillary     Status: Abnormal   Collection Time: 08/25/18  8:48 PM  Result Value Ref Range   Glucose-Capillary 192 (H) 70 - 99 mg/dL  Glucose, capillary     Status: Abnormal   Collection Time: 08/26/18  7:03 AM  Result Value Ref Range   Glucose-Capillary 190 (H) 70 - 99 mg/dL  Glucose, capillary     Status: Abnormal   Collection Time: 08/26/18 11:26 AM  Result Value Ref Range   Glucose-Capillary 115 (H) 70 - 99 mg/dL    Blood Alcohol level:  Lab Results  Component Value Date   ETH <10 07/19/2018   ETH <10 95/09/3265    Metabolic Disorder Labs: Lab Results  Component Value Date   HGBA1C 5.3 06/05/2018   MPG 105.41 06/05/2018   MPG 102.54 12/30/2017   No results found for: PROLACTIN Lab Results  Component Value Date   CHOL 155 06/05/2018   TRIG 93 06/05/2018   HDL 54 06/05/2018   CHOLHDL 2.9 06/05/2018   VLDL 19 06/05/2018   LDLCALC 82 06/05/2018   [place] 90 12/30/2017    Physical Findings: AIMS:   , ,  ,  ,    CIWA:    COWS:     Musculoskeletal: Strength & Muscle Tone: within normal limits Gait & Station: normal Patient leans: N/A  Psychiatric Specialty Exam: Physical Exam  Nursing note and vitals reviewed. Constitutional: She appears well-developed and well-nourished.  HENT:  Head: Normocephalic and atraumatic.  Eyes: Pupils are equal, round, and reactive to light. Conjunctivae are normal.  Neck: Normal range of motion.  Cardiovascular: Regular rhythm and normal heart sounds.  Respiratory: Effort normal.  GI: Soft.  Musculoskeletal: Normal range of motion.  Neurological: She is alert.  Skin: Skin is warm and dry.  Psychiatric: Her speech is normal and behavior is normal. Thought content normal. Her affect is blunt. Cognition and memory are impaired. She expresses impulsivity.    Review of Systems  Constitutional: Negative.   HENT: Negative.   Eyes: Negative.   Respiratory: Negative.   Cardiovascular: Negative.   Gastrointestinal: Negative.   Musculoskeletal: Negative.   Skin: Negative.   Neurological: Negative.   Psychiatric/Behavioral: Negative.     Blood pressure (!) 145/85, pulse 91, temperature 97.9 F (36.6 C), temperature source Oral, resp. rate 16, height 4\' 10"  (1.473 m), weight 72.6 kg, SpO2 97 %.Body mass index is 33.44 kg/m.  General Appearance: Fairly Groomed  Eye Contact:  Good  Speech:  Normal Rate  Volume:  Normal  Mood:  Euthymic  Affect:  Constricted  Thought Process:  Goal Directed  Orientation:  Full (Time, Place, and Person)  Thought Content:  Logical  Suicidal Thoughts:  No  Homicidal Thoughts:  No  Memory:  Immediate;   Fair Recent;   Fair Remote;   Fair  Judgement:  Fair  Insight:  Fair  Psychomotor Activity:  Normal  Concentration:  Concentration: Fair  Recall:  [employer] of Knowledge:  Fair  Language:  Fair  Akathisia:  No  Handed:  Right  AIMS (if indicated):     Assets:  Desire for Improvement  ADL's:  Impaired   Cognition:  Impaired,  Moderate  Sleep:  Number of Hours: 6.5     Treatment Plan Summary: Daily contact with patient to assess and evaluate symptoms and progress in treatment, Medication management and Plan No change to medication.  Continue monitoring blood pressure.  Patient and I talked about her antipsychotics but I do not think it would make any sense to change any of that today.  Too many changes to quickly lead to confusion when really working on her her behavior is her top priority.  We are continuing to look for discharge planning.  Coached patient to try to remain calm and optimistic.  Alethia Berthold, MD 08/26/2018, 1:47 PM

## 2018-08-26 NOTE — [facility] Group Notes (Signed)
LCSW Group Therapy Note  08/26/2018 11:30 AM  Type of Therapy and Topic:  Group Therapy:  Feelings around Relapse and Recovery  Participation Level:  Active   Description of Group:    Patients in this group will discuss emotions they experience before and after a relapse. They will process how experiencing these feelings, or avoidance of experiencing them, relates to having a relapse. Facilitator will guide patients to explore emotions they have related to recovery. Patients will be encouraged to process which emotions are more powerful. They will be guided to discuss the emotional reaction significant others in their lives may have to their relapse or recovery. Patients will be assisted in exploring ways to respond to the emotions of others without this contributing to a relapse.  Therapeutic Goals: 1. Patient will identify two or more emotions that lead to a relapse for them 2. Patient will identify two emotions that result when they relapse 3. Patient will identify two emotions related to recovery 4. Patient will demonstrate ability to communicate their needs through discussion and/or role plays   Summary of Patient Progress: Pt was appropriate and respectful in group. Pt was able to identify coping strategies she will implement when she is discharged from the hospital to keep her from returning. Pt reported the main thing she will do is be in contact with her siblings in order to keep herself calm. Pt reported that she will also exercise, go to therapy, talk with her doctor, and watch birds as coping strategies. Pt discussed consequences of her relapsing on negative behaviors would be losing friends and family and ending back up in the hospital.     Therapeutic Modalities:   Cognitive Behavioral Therapy Solution-Focused Therapy Assertiveness Training Relapse Prevention Therapy   Evalina Field, MSW, LCSW Clinical Social Work 08/26/2018 11:30 AM
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
## 2018-08-26 NOTE — Progress Notes (Signed)
CSW received voicemail from [place] [facility] reporting that from the notes they received, pt does not meet criteria to be on the priority list at this time.  Evalina Field, MSW, LCSW Clinical Social Work 08/26/2018 11:13 AM

## 2018-08-26 NOTE — BHH Counselor (Signed)
CSW called [facility] to confirm the patient was on the waitlist.  CSW spoke with Amina.  It was confirmed that patient was on the waitlist. CSW also confirmed that supporting documents were received that was sent 08/25/2018.  CSW asked if patient could be considered for the priority list due to increase in aggressive behaviors.  CSW was informed that more documentation was needed and nurse Juanda Crumble at [facility] will review.   Assunta Curtis, MSW, LCSW 08/26/2018 10:31 AM
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
## 2018-08-26 NOTE — Plan of Care (Signed)
Patient stated that her goal for today is not to get angry and be good with everyone.Rated her depression 0/10 and anxiety 2/10.Denies SI,HI and AVH.Appropriate in the unit.Compliant with medications.Appetite and energy level good.Support and encouragement given.

## 2018-08-26 NOTE — B[facility] Counselor (Signed)
CSW called patient's guardian Porfirio Mylar with [employer] [phone] ext [phone], regarding an update on patient.    CSW had to leave a HIPAA compliant voicemail.  CSW has faxed infomraiton to [facility] for possible placement. Fax was successful.   Assunta Curtis, MSW, LCSW 08/26/2018 11:47 AM

## 2018-08-26 NOTE — B[facility] Counselor (Signed)
CSW sent additional information regarding patient being placed on priority list.  CSW received confirmation the fax was successful.   Assunta Curtis, MSW, LCSW 08/26/2018 10:34 AM

## 2018-08-27 DIAGNOSIS — F79 Unspecified intellectual disabilities: Secondary | ICD-10-CM

## 2018-08-27 DIAGNOSIS — F603 Borderline personality disorder: Secondary | ICD-10-CM

## 2018-08-27 LAB — GLUCOSE, CAPILLARY
Glucose-Capillary: 178 mg/dL — ABNORMAL HIGH (ref 70–99)
Glucose-Capillary: 176 mg/dL — ABNORMAL HIGH (ref 70–99)
Glucose-Capillary: 137 mg/dL — ABNORMAL HIGH (ref 70–99)

## 2018-08-27 NOTE — Plan of Care (Signed)
Patient oriented to unit. Patient's safety is maintained on unit. Patient denies SI/HI/AVH. Patient is frequently at nurses station. Patient reports pleasant mood. Patient is frequently asking for mail from her sister that was sent over a week ago.   Problem: Education: Goal: Knowledge of [place] [facility] Education information/materials will improve Outcome: Not Progressing   Problem: Coping: Goal: Ability to verbalize frustrations and anger appropriately will improve Outcome: Not Progressing Goal: Ability to demonstrate self-control will improve Outcome: Not Progressing

## 2018-08-27 NOTE — [facility] Group Notes (Signed)
LCSW Group Therapy Note  08/27/2018 1:15pm  Type of Therapy and Topic:  Group Therapy:  Healthy Self Image and Positive Change  Participation Level:  Did Not Attend   Description of Group:  In this group, patients will compare and contrast their current "I am...." statements to the visions they identify as desirable for their lives.  Patients discuss fears and how they can make positive changes in their cognitions that will positively impact their behaviors.  Facilitator played a motivational 3-minute speech and patients were left with the task of thinking about what "I am...." statements they can start using in their lives immediately.  Therapeutic Goals: 1. Patient will state their current self-perception as expressed in an "I Am" statement 2. Patient will contrast this with their desired vision for their live 3. Patient will identify 3 fears that negatively impact their behavior 4. Patient will discuss cognitive distortions that stem from their fears 5. Patient will verbalize statements that challenge their cognitive distortions  Summary of Patient Progress:  Pt was invited to attend group but chose not to attend. CSW will continue to encourage pt to attend group throughout their admission.    Therapeutic Modalities Cognitive Behavioral Therapy Motivational Interviewing  Tong Pieczynski  CUEBAS-COLON, LCSW 08/27/2018 1:04 PM

## 2018-08-27 NOTE — Progress Notes (Signed)
[name] MD Progress Note  08/27/2018 10:33 AM Jessica [name]  MRN:  [id number] Subjective: Patient is a 53 year old female with a past psychiatric history significant for moderate intellectual disability, personality disorder, as well as a diagnosis of bipolar disorder who was admitted on 08/05/2018.  She was transferred from the medical service.  She had a spell of hypoxia that was of unclear origin, but had recovered and then returned to the psychiatric unit.  Objective: Patient is seen and examined.  Patient is a 53 year old female familiar to me from previous evaluations while covering this unit.  She is essentially unchanged.  She remains mildly intrusive, but overall pleasant.  She has had intermittent episodes of becoming upset.  She continues to ask when she will be discharged and what group home she will be going to.  She denied any auditory or visual hallucinations.  She denied any suicidal or homicidal ideation.  She remains on Lipitor, Cymbalta, iron sulfate, gabapentin, sliding scale insulin, Lamictal, levothyroxine, loratadine, Latuda, metformin, Protonix and trazodone.  Review of her laboratories revealed her blood sugar at 178.  Her last chemistries were on 7/13.  They were only mildly abnormal.  Her CBC from 7/24 showed a mild anemia.  Her vital signs are stable, she is afebrile.  She slept 8 hours.  Principal Problem: Schizoaffective disorder, bipolar type ([place]) Diagnosis: Principal Problem:   Schizoaffective disorder, bipolar type ([place]) Active Problems:   Diabetes mellitus without complication ([place])   Intellectual disability   Agitation   Borderline personality disorder ([facility])   HTN (hypertension)   Hypothyroidism   Bipolar disorder, unspecified ([place])  Total Time spent with patient: 20 minutes  Past Psychiatric History: See admission H&P  Past Medical History:  Past Medical History:  Diagnosis Date  . Anxiety   . Depression   . Diabetes mellitus without complication ([place])   .  GERD (gastroesophageal reflux disease)   . Homicidal ideation   . Hypertension   . Hypothyroidism     Past Surgical History:  Procedure Laterality Date  . COLPOSCOPY    . FRACTURE SURGERY    . TRACHEOSTOMY     Family History: History reviewed. No pertinent family history. Family Psychiatric  History: See admission H&P Social History:  Social History   Substance and Sexual Activity  Alcohol Use No  . Frequency: Never     Social History   Substance and Sexual Activity  Drug Use Yes  . Types: Marijuana, "Crack" cocaine   Comment: reports she has not done anything in a long time    Social History   Socioeconomic History  . Marital status: Single    Spouse name: Not on file  . Number of children: Not on file  . Years of education: Not on file  . Highest education level: Not on file  Occupational History  . Not on file  Social Needs  . Financial resource strain: Somewhat hard  . Food insecurity    Worry: Patient refused    Inability: Patient refused  . Transportation needs    Medical: Patient refused    Non-medical: Patient refused  Tobacco Use  . Smoking status: Former Research scientist (life sciences)  . Smokeless tobacco: Never Used  Substance and Sexual Activity  . Alcohol use: No    Frequency: Never  . Drug use: Yes    Types: Marijuana, "Crack" cocaine    Comment: reports she has not done anything in a long time  . Sexual activity: Not Currently    Birth control/protection: Abstinence  Lifestyle  . Physical activity    Days per week: Patient refused    Minutes per session: Patient refused  . Stress: Rather much  Relationships  . Social Herbalist on phone: Patient refused    Gets together: Patient refused    Attends religious service: Patient refused    Active member of club or organization: Patient refused    Attends meetings of clubs or organizations: Patient refused    Relationship status: Patient refused  Other Topics Concern  . Not on file  Social History  Narrative  . Not on file   Additional Social History:                         Sleep: Good  Appetite:  Good  Current Medications: Current Facility-Administered Medications  Medication Dose Route Frequency Provider Last Rate Last Dose  . acetaminophen (TYLENOL) tablet 650 mg  650 mg Oral Q6H PRN Money, Lowry Ram, FNP   650 mg at 08/27/18 0254  . alum & mag hydroxide-simeth (MAALOX/MYLANTA) 200-200-20 MG/5ML suspension 30 mL  30 mL Oral Q4H PRN Money, Lowry Ram, FNP      . atorvastatin (LIPITOR) tablet 10 mg  10 mg Oral q1800 Money, Lowry Ram, FNP   10 mg at 08/25/18 1706  . DULoxetine (CYMBALTA) DR capsule 60 mg  60 mg Oral Daily Clapacs, Madie Reno, MD   60 mg at 08/27/18 0733  . ferrous sulfate tablet 325 mg  325 mg Oral BID WC Money, Lowry Ram, FNP   325 mg at 08/27/18 2706  . gabapentin (NEURONTIN) capsule 900 mg  900 mg Oral TID Clapacs, John T, MD   900 mg at 08/27/18 0733  . hydrOXYzine (ATARAX/VISTARIL) tablet 50 mg  50 mg Oral Q6H PRN Clapacs, Madie Reno, MD   50 mg at 08/20/18 1102  . insulin aspart (novoLOG) injection 0-5 Units  0-5 Units Subcutaneous QHS Money, Lowry Ram, FNP   2 Units at 08/09/18 2142  . insulin aspart (novoLOG) injection 0-9 Units  0-9 Units Subcutaneous TID WC Money, Lowry Ram, FNP   2 Units at 08/27/18 [phone]  . lamoTRIgine (LAMICTAL) tablet 150 mg  150 mg Oral QHS Clapacs, Madie Reno, MD   150 mg at 08/26/18 2207  . levothyroxine (SYNTHROID) tablet 150 mcg  150 mcg Oral Q0600 Money, Lowry Ram, FNP   150 mcg at 08/27/18 2831  . loratadine (CLARITIN) tablet 10 mg  10 mg Oral Daily Clapacs, Madie Reno, MD   10 mg at 08/27/18 0733  . LORazepam (ATIVAN) tablet 2 mg  2 mg Oral Q4H PRN Clapacs, Madie Reno, MD   2 mg at 08/23/18 2355   Or  . LORazepam (ATIVAN) injection 2 mg  2 mg Intramuscular Q4H PRN Clapacs, Madie Reno, MD   2 mg at 08/26/18 1424  . lurasidone (LATUDA) tablet 80 mg  80 mg Oral BID WC Sharma Covert, MD   80 mg at 08/27/18 5176  . magnesium hydroxide (MILK OF  MAGNESIA) suspension 30 mL  30 mL Oral Daily PRN Money, Darnelle Maffucci B, FNP      . metFORMIN (GLUCOPHAGE) tablet 1,000 mg  1,000 mg Oral BID WC Sharma Covert, MD   1,000 mg at 08/27/18 0733  . moxifloxacin (VIGAMOX) 0.5 % ophthalmic solution 1 drop  1 drop Right Eye TID Money, Lowry Ram, FNP   1 drop at 08/27/18 1607  . neomycin-bacitracin-polymyxin (NEOSPORIN) ointment   Topical BID  Sharma Covert, MD      . pantoprazole (PROTONIX) EC tablet 40 mg  40 mg Oral Daily Money, Lowry Ram, FNP   40 mg at 08/27/18 7510  . traZODone (DESYREL) tablet 100 mg  100 mg Oral QHS Money, Travis B, FNP   100 mg at 08/26/18 2207  . ziprasidone (GEODON) injection 20 mg  20 mg Intramuscular Q12H PRN Clapacs, John T, MD   20 mg at 08/26/18 1424    Lab Results:  Results for orders placed or performed during the hospital encounter of 08/04/18 (from the past 48 hour(s))  Glucose, capillary     Status: Abnormal   Collection Time: 08/25/18 11:33 AM  Result Value Ref Range   Glucose-Capillary 164 (H) 70 - 99 mg/dL  Glucose, capillary     Status: Abnormal   Collection Time: 08/25/18  4:05 PM  Result Value Ref Range   Glucose-Capillary 134 (H) 70 - 99 mg/dL  Glucose, capillary     Status: Abnormal   Collection Time: 08/25/18  8:48 PM  Result Value Ref Range   Glucose-Capillary 192 (H) 70 - 99 mg/dL  Glucose, capillary     Status: Abnormal   Collection Time: 08/26/18  7:03 AM  Result Value Ref Range   Glucose-Capillary 190 (H) 70 - 99 mg/dL  Glucose, capillary     Status: Abnormal   Collection Time: 08/26/18 11:26 AM  Result Value Ref Range   Glucose-Capillary 115 (H) 70 - 99 mg/dL  Glucose, capillary     Status: Abnormal   Collection Time: 08/26/18 11:11 PM  Result Value Ref Range   Glucose-Capillary 117 (H) 70 - 99 mg/dL   Comment 1 Notify RN   Glucose, capillary     Status: Abnormal   Collection Time: 08/27/18  7:26 AM  Result Value Ref Range   Glucose-Capillary 178 (H) 70 - 99 mg/dL   Comment 1 Notify  RN     Blood Alcohol level:  Lab Results  Component Value Date   ETH <10 07/19/2018   ETH <10 25/85/2778    Metabolic Disorder Labs: Lab Results  Component Value Date   HGBA1C 5.3 06/05/2018   MPG 105.41 06/05/2018   MPG 102.54 12/30/2017   No results found for: PROLACTIN Lab Results  Component Value Date   CHOL 155 06/05/2018   TRIG 93 06/05/2018   HDL 54 06/05/2018   CHOLHDL 2.9 06/05/2018   VLDL 19 06/05/2018   LDLCALC 82 06/05/2018   [place] 90 12/30/2017    Physical Findings: AIMS:  , ,  ,  ,    CIWA:    COWS:     Musculoskeletal: Strength & Muscle Tone: within normal limits Gait & Station: normal Patient leans: N/A  Psychiatric Specialty Exam: Physical Exam  Nursing note and vitals reviewed. Constitutional: She appears well-developed and well-nourished.  HENT:  Head: Normocephalic and atraumatic.  Respiratory: Effort normal.  Neurological: She is alert.    ROS  Blood pressure 131/70, pulse 83, temperature 98.2 F (36.8 C), temperature source Oral, resp. rate 18, height 4\' 10"  (1.473 m), weight 72.6 kg, SpO2 96 %.Body mass index is 33.44 kg/m.  General Appearance: Casual  Eye Contact:  Fair  Speech:  Normal Rate  Volume:  Normal  Mood:  Anxious  Affect:  Congruent  Thought Process:  Goal Directed and Descriptions of Associations: Circumstantial  Orientation:  Negative  Thought Content:  Logical  Suicidal Thoughts:  No  Homicidal Thoughts:  No  Memory:  Immediate;  Fair Recent;   Fair Remote;   Fair  Judgement:  Impaired  Insight:  Lacking  Psychomotor Activity:  Increased  Concentration:  Concentration: Fair and Attention Span: Fair  Recall:  [employer] of Knowledge:  Fair  Language:  Fair  Akathisia:  Negative  Handed:  Right  AIMS (if indicated):     Assets:  Desire for Improvement Resilience  ADL's:  Intact  Cognition:  Impaired,  Moderate  Sleep:  Number of Hours: 8     Treatment Plan Summary: Daily contact with patient to  assess and evaluate symptoms and progress in treatment, Medication management and Plan : Patient is seen and examined.  Patient is a 53 year old female with the above-stated past psychiatric history who is seen in follow-up.   Diagnosis: #1 bipolar disorder, #2 intellectual disability, #3 diabetes mellitus type 2, #4 hypothyroidism, #5 GERD  Patient is seen in follow-up.  She is essentially unchanged from the last time I saw her.  Her medicines for psychiatric reasons are stable.  No change in knees.  Her blood sugar remains elevated.  She remains on a sliding scale with Metformin at thousand milligrams p.o. twice daily.  She had previously been on Lantus insulin, but that is been stopped.  We will discontinue the sliding scale.  Hopefully she will be able to get placement soon. 1.  Continue Lipitor 10 mg p.o. nightly for hyperlipidemia. 2.  Continue Cymbalta 60 mg p.o. daily for mood and anxiety. 3.  Continue ferrous sulfate twice daily for anemia. 4.  Continue gabapentin 300 mg p.o. 3 times daily for anxiety, mood stability and neuropathy. 5.  Continue hydroxyzine 50 mg p.o. every 6 hours as needed anxiety. 6.  Continue sliding scale insulin for diabetes. 7.  Continue Lamictal 150 mg p.o. nightly for mood stability. 8.  Continue levothyroxine 150 mcg p.o. daily for hypothyroidism. 9.  Continue loratadine 10 mg p.o. daily for seasonal allergies. 10.  Continue PRN lorazepam for agitation. 11.  Continue Latuda 80 mg p.o. twice daily for mood stability. 12.  Continue Metformin at thousand milligrams p.o. twice daily for diabetes mellitus. 13.  Continue moxifloxacin 1 drop in right eye 3 times daily for ophthalmic problems. 14.  Continue Neosporin ointment to knees bilaterally. 15.  Continue Protonix 40 mg p.o. daily for GERD. 16.  Continue trazodone 100 mg p.o. nightly for insomnia. 17.  Disposition planning-in progress.  Sharma Covert, MD 08/27/2018, 10:33 AM

## 2018-08-27 NOTE — Progress Notes (Signed)
D - Patient was in her room upon arrival to the unit. Patient was pleasant during assessment and medication administration. Patient denies SI/HI/AVH, pain, anxiety and depression with this Probation officer. Patient was needy and continually coming up to the nurses station this evening. Patient was more isolative on the unit this evening. Patient refused evening medications and finger stick stating, "I just want to sleep." Patient given education.   A - Patient compliant with medication administration per MD orders and procedures on the unit. Patient given education. Patient given support and encouragement to be active in her treatment plan. Patient informed to let staff know if there are any issues or problems on the unit. Patient observed interacting appropriately with staff and peers on the unit by this Probation officer.   R - Patient being monitored Q 15 minutes for safety per unit protocol. Patient remains safe on the unit.

## 2018-08-27 NOTE — Plan of Care (Signed)
Patient was not active on the unit this evening with peers or staff. Patient was isolative and wanted to sleep.   Problem: Activity: Goal: Interest or engagement in leisure activities will improve Outcome: Not Progressing

## 2018-08-27 NOTE — Progress Notes (Signed)
Patient is observed yelling and screaming at staff. Using profane language, calling secruity "dumb niggers." The Patient goes on to threaten acts of physical violence against staff members. The patient continues to yell and scream at staff. Patient is deescalated and left in room.

## 2018-08-28 DIAGNOSIS — F3489 Other specified persistent mood disorders: Secondary | ICD-10-CM

## 2018-08-28 LAB — GLUCOSE, CAPILLARY
Glucose-Capillary: 216 mg/dL — ABNORMAL HIGH (ref 70–99)
Glucose-Capillary: 144 mg/dL — ABNORMAL HIGH (ref 70–99)
Glucose-Capillary: 121 mg/dL — ABNORMAL HIGH (ref 70–99)
Glucose-Capillary: 156 mg/dL — ABNORMAL HIGH (ref 70–99)

## 2018-08-28 MED ORDER — INSULIN GLARGINE 100 UNIT/ML ~~LOC~~ SOLN
15.0000 [IU] | Freq: Every day | SUBCUTANEOUS | Status: DC
  Administered 2018-08-28 – 2018-09-18 (×22): 15 [IU] via SUBCUTANEOUS
  Filled 2018-08-28 (×23): qty 0.15

## 2018-08-28 MED ORDER — CARVEDILOL 6.25 MG PO TABS
3.1250 mg | ORAL_TABLET | Freq: Two times a day (BID) | ORAL | Status: DC
  Administered 2018-08-28 – 2018-09-19 (×40): 3.125 mg via ORAL
  Filled 2018-08-28 (×44): qty 1

## 2018-08-28 MED ORDER — LAMOTRIGINE 25 MG PO TABS
175.0000 mg | ORAL_TABLET | Freq: Every day | ORAL | Status: DC
  Administered 2018-08-28 – 2018-09-18 (×22): 175 mg via ORAL
  Filled 2018-08-28 (×23): qty 1

## 2018-08-28 NOTE — Plan of Care (Signed)
D- Patient alert and oriented. Patient presents in a pleasant mood on assessment stating that she slept ok last night and had no major complaints to voice to this Probation officer. Patient denies any anxiety, however, she endorses "a little bit" of depression, stating that "about the group home" is why she's feeling this way. Patient also denies SI, HI, AVH, and pain at this time. Patient had no stated goals for today.  A- Scheduled medications administered to patient, per MD orders. Support and encouragement provided.  Routine safety checks conducted every 15 minutes.  Patient informed to notify staff with problems or concerns.  R- No adverse drug reactions noted. Patient contracts for safety at this time. Patient compliant with medications and treatment plan. Patient receptive, calm, and cooperative. Patient interacts well with others on the unit.  Patient remains safe at this time.  Problem: Education: Goal: Knowledge of [place] [facility] Education information/materials will improve Outcome: Progressing   Problem: Coping: Goal: Ability to verbalize frustrations and anger appropriately will improve Outcome: Progressing Goal: Ability to demonstrate self-control will improve Outcome: Progressing   Problem: Safety: Goal: Periods of time without injury will increase Outcome: Progressing   Problem: Safety: Goal: Ability to redirect hostility and anger into socially appropriate behaviors will improve Outcome: Progressing   Problem: Activity: Goal: Interest or engagement in leisure activities will improve Outcome: Progressing Goal: Imbalance in normal sleep/wake cycle will improve Outcome: Progressing

## 2018-08-28 NOTE — Progress Notes (Signed)
Patient is asleep from receiving her PRN injections. This writer will administer her morning medications once she awakes.

## 2018-08-28 NOTE — Progress Notes (Signed)
[facility] MD Progress Note  08/28/2018 10:03 AM Jessica [name]  MRN:  [id number] Subjective:  Patient is a 53 year old female with a past psychiatric history significant for moderate intellectual disability, personality disorder, as well as a diagnosis of bipolar disorder who was admitted on 08/05/2018.  She was transferred from the medical service.  She had a spell of hypoxia that was of unclear origin, but had recovered and then returned to the psychiatric unit.  Objective: Patient is seen and examined.  Patient is a 53 year old female familiar to me from previous evaluations while covering this unit.  She is essentially unchanged.  She stated again that she had a minor meltdown this morning, but overall is about the same.  We discussed using behavioral control mechanisms versus increasing her medications to where she might be oversedated.  She denied any suicidal or homicidal ideation.  She denied any auditory or visual hallucinations.  None of her medications were changed yesterday.  Her blood pressure remains elevated at 149/88, she is afebrile.  The rest of her vital signs are stable.  She slept 6.25 hours last night.  Her blood sugar this morning is 216.  Her current blood sugar medications include a sliding scale and metformin thousand milligrams p.o. twice daily.  She is not on any blood pressure medication currently.  Principal Problem: Schizoaffective disorder, bipolar type ([place]) Diagnosis: Principal Problem:   Schizoaffective disorder, bipolar type ([place]) Active Problems:   Diabetes mellitus without complication ([place])   Intellectual disability   Agitation   Borderline personality disorder (H[facility])   HTN (hypertension)   Hypothyroidism   Bipolar disorder, unspecified ([place])  Total Time spent with patient: 20 minutes  Past Psychiatric History: See admission H&P  Past Medical History:  Past Medical History:  Diagnosis Date  . Anxiety   . Depression   . Diabetes mellitus without complication  ([place])   . GERD (gastroesophageal reflux disease)   . Homicidal ideation   . Hypertension   . Hypothyroidism     Past Surgical History:  Procedure Laterality Date  . COLPOSCOPY    . FRACTURE SURGERY    . TRACHEOSTOMY     Family History: History reviewed. No pertinent family history. Family Psychiatric  History: See admission H&P Social History:  Social History   Substance and Sexual Activity  Alcohol Use No  . Frequency: Never     Social History   Substance and Sexual Activity  Drug Use Yes  . Types: Marijuana, "Crack" cocaine   Comment: reports she has not done anything in a long time    Social History   Socioeconomic History  . Marital status: Single    Spouse name: Not on file  . Number of children: Not on file  . Years of education: Not on file  . Highest education level: Not on file  Occupational History  . Not on file  Social Needs  . Financial resource strain: Somewhat hard  . Food insecurity    Worry: Patient refused    Inability: Patient refused  . Transportation needs    Medical: Patient refused    Non-medical: Patient refused  Tobacco Use  . Smoking status: Former Research scientist (life sciences)  . Smokeless tobacco: Never Used  Substance and Sexual Activity  . Alcohol use: No    Frequency: Never  . Drug use: Yes    Types: Marijuana, "Crack" cocaine    Comment: reports she has not done anything in a long time  . Sexual activity: Not Currently  Birth control/protection: Abstinence  Lifestyle  . Physical activity    Days per week: Patient refused    Minutes per session: Patient refused  . Stress: Rather much  Relationships  . Social Herbalist on phone: Patient refused    Gets together: Patient refused    Attends religious service: Patient refused    Active member of club or organization: Patient refused    Attends meetings of clubs or organizations: Patient refused    Relationship status: Patient refused  Other Topics Concern  . Not on file  Social  History Narrative  . Not on file   Additional Social History:                         Sleep: Good  Appetite:  Good  Current Medications: Current Facility-Administered Medications  Medication Dose Route Frequency Provider Last Rate Last Dose  . acetaminophen (TYLENOL) tablet 650 mg  650 mg Oral Q6H PRN Money, Lowry Ram, FNP   650 mg at 08/27/18 7425  . alum & mag hydroxide-simeth (MAALOX/MYLANTA) 200-200-20 MG/5ML suspension 30 mL  30 mL Oral Q4H PRN Money, Lowry Ram, FNP      . atorvastatin (LIPITOR) tablet 10 mg  10 mg Oral q1800 Money, Lowry Ram, FNP   10 mg at 08/27/18 1612  . DULoxetine (CYMBALTA) DR capsule 60 mg  60 mg Oral Daily Clapacs, Madie Reno, MD   60 mg at 08/28/18 0907  . ferrous sulfate tablet 325 mg  325 mg Oral BID WC Money, Lowry Ram, FNP   325 mg at 08/28/18 0908  . gabapentin (NEURONTIN) capsule 900 mg  900 mg Oral TID Clapacs, Madie Reno, MD   900 mg at 08/28/18 0908  . hydrOXYzine (ATARAX/VISTARIL) tablet 50 mg  50 mg Oral Q6H PRN Clapacs, Madie Reno, MD   50 mg at 08/20/18 1102  . insulin aspart (novoLOG) injection 0-5 Units  0-5 Units Subcutaneous QHS Money, Lowry Ram, FNP   2 Units at 08/09/18 2142  . insulin aspart (novoLOG) injection 0-9 Units  0-9 Units Subcutaneous TID WC Money, Lowry Ram, FNP   3 Units at 08/28/18 0750  . lamoTRIgine (LAMICTAL) tablet 150 mg  150 mg Oral QHS Clapacs, Madie Reno, MD   150 mg at 08/27/18 2339  . levothyroxine (SYNTHROID) tablet 150 mcg  150 mcg Oral Q0600 Money, Darnelle Maffucci B, FNP   150 mcg at 08/28/18 0600  . loratadine (CLARITIN) tablet 10 mg  10 mg Oral Daily Clapacs, Madie Reno, MD   10 mg at 08/28/18 0908  . LORazepam (ATIVAN) tablet 2 mg  2 mg Oral Q4H PRN Clapacs, Madie Reno, MD   2 mg at 08/28/18 0600   Or  . LORazepam (ATIVAN) injection 2 mg  2 mg Intramuscular Q4H PRN Clapacs, Madie Reno, MD   2 mg at 08/26/18 1424  . lurasidone (LATUDA) tablet 80 mg  80 mg Oral BID WC Sharma Covert, MD   80 mg at 08/28/18 0908  . magnesium hydroxide (MILK  OF MAGNESIA) suspension 30 mL  30 mL Oral Daily PRN Money, Darnelle Maffucci B, FNP      . metFORMIN (GLUCOPHAGE) tablet 1,000 mg  1,000 mg Oral BID WC Sharma Covert, MD   1,000 mg at 08/28/18 0907  . moxifloxacin (VIGAMOX) 0.5 % ophthalmic solution 1 drop  1 drop Right Eye TID Money, Lowry Ram, FNP   1 drop at 08/28/18 0908  . neomycin-bacitracin-polymyxin (NEOSPORIN) ointment  Topical BID Sharma Covert, MD      . pantoprazole (PROTONIX) EC tablet 40 mg  40 mg Oral Daily Money, Lowry Ram, FNP   40 mg at 08/28/18 0908  . traZODone (DESYREL) tablet 100 mg  100 mg Oral QHS Money, Travis B, FNP   100 mg at 08/27/18 2339  . ziprasidone (GEODON) injection 20 mg  20 mg Intramuscular Q12H PRN Clapacs, Madie Reno, MD   20 mg at 08/28/18 6213    Lab Results:  Results for orders placed or performed during the hospital encounter of 08/04/18 (from the past 48 hour(s))  Glucose, capillary     Status: Abnormal   Collection Time: 08/26/18 11:26 AM  Result Value Ref Range   Glucose-Capillary 115 (H) 70 - 99 mg/dL  Glucose, capillary     Status: Abnormal   Collection Time: 08/26/18 11:11 PM  Result Value Ref Range   Glucose-Capillary 117 (H) 70 - 99 mg/dL   Comment 1 Notify RN   Glucose, capillary     Status: Abnormal   Collection Time: 08/27/18  7:26 AM  Result Value Ref Range   Glucose-Capillary 178 (H) 70 - 99 mg/dL   Comment 1 Notify RN   Glucose, capillary     Status: Abnormal   Collection Time: 08/27/18 11:35 AM  Result Value Ref Range   Glucose-Capillary 176 (H) 70 - 99 mg/dL  Glucose, capillary     Status: Abnormal   Collection Time: 08/27/18  4:10 PM  Result Value Ref Range   Glucose-Capillary 137 (H) 70 - 99 mg/dL  Glucose, capillary     Status: Abnormal   Collection Time: 08/28/18  6:52 AM  Result Value Ref Range   Glucose-Capillary 216 (H) 70 - 99 mg/dL   Comment 1 Notify RN     Blood Alcohol level:  Lab Results  Component Value Date   ETH <10 07/19/2018   ETH <10 08/65/7846     Metabolic Disorder Labs: Lab Results  Component Value Date   HGBA1C 5.3 06/05/2018   MPG 105.41 06/05/2018   MPG 102.54 12/30/2017   No results found for: PROLACTIN Lab Results  Component Value Date   CHOL 155 06/05/2018   TRIG 93 06/05/2018   HDL 54 06/05/2018   CHOLHDL 2.9 06/05/2018   VLDL 19 06/05/2018   LDLCALC 82 06/05/2018   [place] 90 12/30/2017    Physical Findings: AIMS:  , ,  ,  ,    CIWA:    COWS:     Musculoskeletal: Strength & Muscle Tone: within normal limits Gait & Station: normal Patient leans: N/A  Psychiatric Specialty Exam: Physical Exam  Nursing note and vitals reviewed. Constitutional: She is oriented to person, place, and time. She appears well-developed and well-nourished.  HENT:  Head: Normocephalic and atraumatic.  Respiratory: Effort normal.  Neurological: She is alert and oriented to person, place, and time.    ROS  Blood pressure (!) 149/88, pulse 96, temperature 97.8 F (36.6 C), temperature source Oral, resp. rate 18, height 4\' 10"  (1.473 m), weight 72.6 kg, SpO2 100 %.Body mass index is 33.44 kg/m.  General Appearance: Casual  Eye Contact:  Good  Speech:  Normal Rate  Volume:  Normal  Mood:  Anxious  Affect:  Congruent  Thought Process:  Goal Directed and Descriptions of Associations: Circumstantial  Orientation:  Full (Time, Place, and Person)  Thought Content:  Rumination  Suicidal Thoughts:  No  Homicidal Thoughts:  No  Memory:  Immediate;  Fair Recent;   Fair Remote;   Fair  Judgement:  Impaired  Insight:  Lacking  Psychomotor Activity:  Increased  Concentration:  Concentration: Fair and Attention Span: Fair  Recall:  [employer] of Knowledge:  Fair  Language:  Fair  Akathisia:  Negative  Handed:  Right  AIMS (if indicated):     Assets:  Desire for Improvement Resilience  ADL's:  Intact  Cognition:  Impaired,  Mild  Sleep:  Number of Hours: 6.25     Treatment Plan Summary: Daily contact with patient  to assess and evaluate symptoms and progress in treatment, Medication management and Plan : Patient is seen and examined.  Patient is a 53 year old female with the above-stated past psychiatric history who is seen in follow-up.   Diagnosis: #1 bipolar disorder, #2 intellectual disability, #3 diabetes mellitus type 2, #4 hypothyroidism, #5 GERD  Patient is seen in follow-up.  She continues to be mildly intrusive but at least redirectable.  Her psychiatric medications appear to be stable at this point.  She is having occasional meltdowns.  I will increase her Lamictal 275 mg p.o. nightly for mood stability.  I will also re-add carvedilol for her hypertension.  Her heart rate is 96, so hopefully her rate will not decrease significantly after the re-addition of the carvedilol.  Her blood sugars remained elevated at 216 this a.m.  She is really only on the Glucophage as well as the sliding scale.  It looks like previously she had gotten Levemir 15 units subcu twice daily back in the hospitalization on 5/27.  I will re-add the Levemir, but we will just start at 15 units subcu nightly and titrate that during the course of the hospitalization.  No other changes in her current medications. 1.  Continue Lipitor 10 mg p.o. nightly for hyperlipidemia. 2.  Continue Cymbalta 60 mg p.o. daily for mood and anxiety. 3.  Continue ferrous sulfate twice daily for anemia. 4.  Continue gabapentin 300 mg p.o. 3 times daily for anxiety, mood stability and neuropathy. 5.  Continue hydroxyzine 50 mg p.o. every 6 hours as needed anxiety. 6.  Continue sliding scale insulin for diabetes. 7.  Increase Lamictal to 175 mg p.o. nightly for mood stability. 8.  Continue levothyroxine 150 mcg p.o. daily for hypothyroidism. 9.  Continue loratadine 10 mg p.o. daily for seasonal allergies. 10.  Continue PRN lorazepam for agitation. 11.  Continue Latuda 80 mg p.o. twice daily for mood stability. 12.  Continue Metformin at thousand  milligrams p.o. twice daily for diabetes mellitus. 13.  Continue moxifloxacin 1 drop in right eye 3 times daily for ophthalmic problems. 14.  Continue Neosporin ointment to knees bilaterally. 15.  Continue Protonix 40 mg p.o. daily for GERD. 16.  Continue trazodone 100 mg p.o. nightly for insomnia. 17.    Add carvedilol 6.25 mg p.o. twice daily for hypertension. 18.  Add Lantus insulin 15 units subcu nightly for diabetic control. 19.  Disposition planning-in progress.  Sharma Covert, MD 08/28/2018, 10:03 AM

## 2018-08-28 NOTE — [facility] Group Notes (Signed)
LCSW Group Therapy Note 08/28/2018 1:15pm  Type of Therapy and Topic: Group Therapy: Feelings Around Returning Home & Establishing a Supportive Framework and Supporting Oneself When Supports Not Available  Participation Level: Active  Description of Group:  Patients first processed thoughts and feelings about upcoming discharge. These included fears of upcoming changes, lack of change, new living environments, judgements and expectations from others and overall stigma of mental health issues. The group then discussed the definition of a supportive framework, what that looks and feels like, and how do to discern it from an unhealthy non-supportive network. The group identified different types of supports as well as what to do when your family/friends are less than helpful or unavailable  Therapeutic Goals  1. Patient will identify one healthy supportive network that they can use at discharge. 2. Patient will identify one factor of a supportive framework and how to tell it from an unhealthy network. 3. Patient able to identify one coping skill to use when they do not have positive supports from others. 4. Patient will demonstrate ability to communicate their needs through discussion and/or role plays.  Summary of Patient Progress:  Pt engaged  during group session. As patients processed their anxiety about discharge and described healthy supports patient shared pt reports she is ready to be discharge but does not have a group home yet.  Patients identified at least one self-care tool they were willing to use after discharge.   Therapeutic Modalities Cognitive Behavioral Therapy Motivational Interviewing   Jessica [name]  CUEBAS-COLON, LCSW 08/28/2018 3:01 PM

## 2018-08-29 LAB — GLUCOSE, CAPILLARY
Glucose-Capillary: 116 mg/dL — ABNORMAL HIGH (ref 70–99)
Glucose-Capillary: 219 mg/dL — ABNORMAL HIGH (ref 70–99)
Glucose-Capillary: 101 mg/dL — ABNORMAL HIGH (ref 70–99)
Glucose-Capillary: 116 mg/dL — ABNORMAL HIGH (ref 70–99)

## 2018-08-29 NOTE — Progress Notes (Signed)
Patient just came to the nurse's station and said "Kayla's a big fat bitch", referencing one of the tech's on the unit. Patient also stated to this writer that "I'm not talking to any of the techs anymore".

## 2018-08-29 NOTE — B[facility] Group Notes (Signed)

## 2018-08-29 NOTE — Plan of Care (Signed)
Patient was pleasant this shift with no behavior issues. Compliance with all medications. BS before bedtime was 156mg /dl, does not need coverage. Currently resting well in bed with q15 minute safety check.

## 2018-08-29 NOTE — [facility] Group Notes (Signed)
[place] Group Notes:  (Nursing/MHT/Case Management/Adjunct)  Date:  08/29/2018  Time:  9:13 PM  Type of Therapy:  Group Therapy  Participation Level:  Active  Participation Quality:  Sharing and [name] she get mad when they tell her they can't find her no where to live .  Affect:  Appropriate  Cognitive:  Alert  Insight:  Good  Engagement in Group:  Engaged  Modes of Intervention:  Discussion  Summary of Progress/Problems:  Jessica [name] 08/29/2018, 9:13 PM

## 2018-08-29 NOTE — Progress Notes (Signed)
[facility] MD Progress Note  08/29/2018 4:56 PM Jessica [name]  MRN:  [id number] Subjective: Follow-up patient with schizoaffective disorder and intellectual disability.  Patient was irritated this morning briefly lost her temper.  Sat down with me for a while to talk about her frustrations.  Offered lots of support and no pretend reassurance.  Tried to be honest but supportive with her. Principal Problem: Schizoaffective disorder, bipolar type ([place]) Diagnosis: Principal Problem:   Schizoaffective disorder, bipolar type ([place]) Active Problems:   Diabetes mellitus without complication ([place])   Intellectual disability   Agitation   Borderline personality disorder (HCC)   HTN (hypertension)   Hypothyroidism   Bipolar disorder, unspecified ([place])  Total Time spent with patient: 30 minutes  Past Psychiatric History: Past history of chronic intellectual disability and behavior  Past Medical History:  Past Medical History:  Diagnosis Date  . Anxiety   . Depression   . Diabetes mellitus without complication ([place])   . GERD (gastroesophageal reflux disease)   . Homicidal ideation   . Hypertension   . Hypothyroidism     Past Surgical History:  Procedure Laterality Date  . COLPOSCOPY    . FRACTURE SURGERY    . TRACHEOSTOMY     Family History: History reviewed. No pertinent family history. Family Psychiatric  History: See previous Social History:  Social History   Substance and Sexual Activity  Alcohol Use No  . Frequency: Never     Social History   Substance and Sexual Activity  Drug Use Yes  . Types: Marijuana, "Crack" cocaine   Comment: reports she has not done anything in a long time    Social History   Socioeconomic History  . Marital status: Single    Spouse name: Not on file  . Number of children: Not on file  . Years of education: Not on file  . Highest education level: Not on file  Occupational History  . Not on file  Social Needs  . Financial resource strain:  Somewhat hard  . Food insecurity    Worry: Patient refused    Inability: Patient refused  . Transportation needs    Medical: Patient refused    Non-medical: Patient refused  Tobacco Use  . Smoking status: Former Research scientist (life sciences)  . Smokeless tobacco: Never Used  Substance and Sexual Activity  . Alcohol use: No    Frequency: Never  . Drug use: Yes    Types: Marijuana, "Crack" cocaine    Comment: reports she has not done anything in a long time  . Sexual activity: Not Currently    Birth control/protection: Abstinence  Lifestyle  . Physical activity    Days per week: Patient refused    Minutes per session: Patient refused  . Stress: Rather much  Relationships  . Social Herbalist on phone: Patient refused    Gets together: Patient refused    Attends religious service: Patient refused    Active member of club or organization: Patient refused    Attends meetings of clubs or organizations: Patient refused    Relationship status: Patient refused  Other Topics Concern  . Not on file  Social History Narrative  . Not on file   Additional Social History:                         Sleep: Fair  Appetite:  Fair  Current Medications: Current Facility-Administered Medications  Medication Dose Route Frequency Provider Last Rate Last Dose  .  acetaminophen (TYLENOL) tablet 650 mg  650 mg Oral Q6H PRN Money, Lowry Ram, FNP   650 mg at 08/29/18 5726  . alum & mag hydroxide-simeth (MAALOX/MYLANTA) 200-200-20 MG/5ML suspension 30 mL  30 mL Oral Q4H PRN Money, Lowry Ram, FNP      . atorvastatin (LIPITOR) tablet 10 mg  10 mg Oral q1800 Money, Lowry Ram, FNP   10 mg at 08/28/18 1721  . carvedilol (COREG) tablet 3.125 mg  3.125 mg Oral BID WC Sharma Covert, MD   3.125 mg at 08/29/18 0820  . DULoxetine (CYMBALTA) DR capsule 60 mg  60 mg Oral Daily , Madie Reno, MD   60 mg at 08/29/18 0820  . ferrous sulfate tablet 325 mg  325 mg Oral BID WC Money, Lowry Ram, FNP   325 mg at  08/29/18 0820  . gabapentin (NEURONTIN) capsule 900 mg  900 mg Oral TID ,  T, MD   900 mg at 08/29/18 1332  . hydrOXYzine (ATARAX/VISTARIL) tablet 50 mg  50 mg Oral Q6H PRN , Madie Reno, MD   50 mg at 08/20/18 1102  . insulin aspart (novoLOG) injection 0-5 Units  0-5 Units Subcutaneous QHS Money, Lowry Ram, FNP   2 Units at 08/09/18 2142  . insulin aspart (novoLOG) injection 0-9 Units  0-9 Units Subcutaneous TID WC Money, Lowry Ram, FNP   3 Units at 08/29/18 1332  . insulin glargine (LANTUS) injection 15 Units  15 Units Subcutaneous QHS Sharma Covert, MD   15 Units at 08/28/18 2143  . lamoTRIgine (LAMICTAL) tablet 175 mg  175 mg Oral QHS Sharma Covert, MD   175 mg at 08/28/18 2118  . levothyroxine (SYNTHROID) tablet 150 mcg  150 mcg Oral Q0600 Money, Lowry Ram, FNP   150 mcg at 08/29/18 2035  . loratadine (CLARITIN) tablet 10 mg  10 mg Oral Daily , Madie Reno, MD   10 mg at 08/29/18 0820  . LORazepam (ATIVAN) tablet 2 mg  2 mg Oral Q4H PRN , Madie Reno, MD   2 mg at 08/29/18 0650   Or  . LORazepam (ATIVAN) injection 2 mg  2 mg Intramuscular Q4H PRN , Madie Reno, MD   2 mg at 08/26/18 1424  . lurasidone (LATUDA) tablet 80 mg  80 mg Oral BID WC Sharma Covert, MD   80 mg at 08/29/18 0819  . magnesium hydroxide (MILK OF MAGNESIA) suspension 30 mL  30 mL Oral Daily PRN Money, Darnelle Maffucci B, FNP      . metFORMIN (GLUCOPHAGE) tablet 1,000 mg  1,000 mg Oral BID WC Sharma Covert, MD   1,000 mg at 08/29/18 0820  . moxifloxacin (VIGAMOX) 0.5 % ophthalmic solution 1 drop  1 drop Right Eye TID Money, Lowry Ram, FNP   1 drop at 08/29/18 1332  . neomycin-bacitracin-polymyxin (NEOSPORIN) ointment   Topical BID Sharma Covert, MD      . pantoprazole (PROTONIX) EC tablet 40 mg  40 mg Oral Daily Money, Lowry Ram, FNP   40 mg at 08/29/18 0819  . traZODone (DESYREL) tablet 100 mg  100 mg Oral QHS Money, Travis B, FNP   100 mg at 08/28/18 2118  . ziprasidone (GEODON) injection 20 mg   20 mg Intramuscular Q12H PRN , Madie Reno, MD   20 mg at 08/29/18 5974    Lab Results:  Results for orders placed or performed during the hospital encounter of 08/04/18 (from the past 48 hour(s))  Glucose, capillary  Status: Abnormal   Collection Time: 08/28/18  6:52 AM  Result Value Ref Range   Glucose-Capillary 216 (H) 70 - 99 mg/dL   Comment 1 Notify RN   Glucose, capillary     Status: Abnormal   Collection Time: 08/28/18 11:16 AM  Result Value Ref Range   Glucose-Capillary 144 (H) 70 - 99 mg/dL  Glucose, capillary     Status: Abnormal   Collection Time: 08/28/18  4:16 PM  Result Value Ref Range   Glucose-Capillary 121 (H) 70 - 99 mg/dL  Glucose, capillary     Status: Abnormal   Collection Time: 08/28/18  9:20 PM  Result Value Ref Range   Glucose-Capillary 156 (H) 70 - 99 mg/dL  Glucose, capillary     Status: Abnormal   Collection Time: 08/29/18  6:46 AM  Result Value Ref Range   Glucose-Capillary 116 (H) 70 - 99 mg/dL  Glucose, capillary     Status: Abnormal   Collection Time: 08/29/18 12:49 PM  Result Value Ref Range   Glucose-Capillary 219 (H) 70 - 99 mg/dL    Blood Alcohol level:  Lab Results  Component Value Date   ETH <10 07/19/2018   ETH <10 16/10/9602    Metabolic Disorder Labs: Lab Results  Component Value Date   HGBA1C 5.3 06/05/2018   MPG 105.41 06/05/2018   MPG 102.54 12/30/2017   No results found for: PROLACTIN Lab Results  Component Value Date   CHOL 155 06/05/2018   TRIG 93 06/05/2018   HDL 54 06/05/2018   CHOLHDL 2.9 06/05/2018   VLDL 19 06/05/2018   LDLCALC 82 06/05/2018   [place] 90 12/30/2017    Physical Findings: AIMS:  , ,  ,  ,    CIWA:    COWS:     Musculoskeletal: Strength & Muscle Tone: within normal limits Gait & Station: normal Patient leans: N/A  Psychiatric Specialty Exam: Physical Exam  Nursing note and vitals reviewed. Constitutional: She appears well-developed and well-nourished.  HENT:  Head:  Normocephalic and atraumatic.  Eyes: Pupils are equal, round, and reactive to light. Conjunctivae are normal.  Neck: Normal range of motion.  Cardiovascular: Regular rhythm and normal heart sounds.  Respiratory: Effort normal. No respiratory distress.  GI: Soft.  Musculoskeletal: Normal range of motion.  Neurological: She is alert.  Skin: Skin is warm and dry.  Psychiatric: Her affect is blunt. Her speech is delayed. She is slowed. Thought content is not paranoid. Cognition and memory are impaired. She expresses impulsivity. She expresses no homicidal and no suicidal ideation.    Review of Systems  Constitutional: Negative.   HENT: Negative.   Eyes: Negative.   Respiratory: Negative.   Cardiovascular: Negative.   Gastrointestinal: Negative.   Musculoskeletal: Negative.   Skin: Negative.   Neurological: Negative.   Psychiatric/Behavioral: Negative for depression, hallucinations, memory loss, substance abuse and suicidal ideas. The patient is nervous/anxious. The patient does not have insomnia.     Blood pressure (!) 145/84, pulse 88, temperature 98.3 F (36.8 C), temperature source Oral, resp. rate 18, height 4\' 10"  (1.473 m), weight 72.6 kg, SpO2 94 %.Body mass index is 33.44 kg/m.  General Appearance: Casual  Eye Contact:  Fair  Speech:  Slow  Volume:  Decreased  Mood:  Dysphoric  Affect:  Congruent  Thought Process:  Coherent  Orientation:  Full (Time, Place, and Person)  Thought Content:  Logical  Suicidal Thoughts:  No  Homicidal Thoughts:  No  Memory:  Immediate;   Fair Recent;  Poor Remote;   Fair  Judgement:  Impaired  Insight:  Shallow  Psychomotor Activity:  Decreased  Concentration:  Concentration: Poor  Recall:  Poor  Fund of Knowledge:  Fair  Language:  Fair  Akathisia:  No  Handed:  Right  AIMS (if indicated):     Assets:  Desire for Improvement  ADL's:  Impaired  Cognition:  Impaired,  Moderate  Sleep:  Number of Hours: 6.5     Treatment Plan  Summary: Daily contact with patient to assess and evaluate symptoms and progress in treatment, Medication management and Plan Supportive therapy and counseling.  No change to medicine.  Review the problems that are standing in the way of her discharge plan.  Alethia Berthold, MD 08/29/2018, 4:56 PM

## 2018-08-29 NOTE — Progress Notes (Signed)
Recreation Therapy Notes  Date: 08/29/2018   Time: 9:30 am   Location: Craft room   Behavioral response: N/A   Intervention Topic: Self-esteem    Discussion/Intervention: Patient did not attend group.   Clinical Observations/Feedback:  Patient did not attend group.   Samora Jernberg LRT/CTRS        Jeffie Spivack 08/29/2018 10:31 AM

## 2018-08-29 NOTE — Progress Notes (Signed)
D - Patient was in her room upon arrival to the unit. Patient was pleasant during assessment and medication administration. Patient denies SI/HI/AVH, pain, anxiety and depression with this Probation officer.Patient said she had a bad day and to be given PRN medications but she said she was going to have a good night.   A - Patient compliant with medication administration per MD orders and procedures on the unit. Patient given education. Patient given support and encouragement to be active in her treatment plan. Patient informed to let staff know if there are any issues or problems on the unit. Patient observed interacting appropriately with staff and peers on the unit by this Probation officer.   R - Patient being monitored Q 15 minutes for safety per unit protocol. Patient remains safe on the unit.

## 2018-08-29 NOTE — Plan of Care (Signed)
D- Patient alert and oriented. Patient presents in a pleasant mood on assessment stating that she slept great last night and reported a pain level of "3/10" in the top of her head, in which she requested pain medication from this Probation officer. Patient denies any anxiety, however, she did endorse a little bit of depression, stating that "the group home" is why she's feeling this way. Patient also denies SI, HI, AVH, at this time. Patient's goal for today is to "go to groups, see my doctor, social worker and go outside".   A- Scheduled medications administered to patient, per MD orders. Support and encouragement provided.  Routine safety checks conducted every 15 minutes.  Patient informed to notify staff with problems or concerns.  R- No adverse drug reactions noted. Patient contracts for safety at this time. Patient compliant with medications and treatment plan. Patient receptive, calm, and cooperative. Patient interacts well with others on the unit.  Patient remains safe at this time.  Problem: Education: Goal: Knowledge of [place] [facility] Education information/materials will improve Outcome: Progressing   Problem: Coping: Goal: Ability to verbalize frustrations and anger appropriately will improve Outcome: Progressing Goal: Ability to demonstrate self-control will improve Outcome: Progressing   Problem: Safety: Goal: Periods of time without injury will increase Outcome: Progressing   Problem: Safety: Goal: Ability to redirect hostility and anger into socially appropriate behaviors will improve Outcome: Progressing   Problem: Activity: Goal: Interest or engagement in leisure activities will improve Outcome: Progressing Goal: Imbalance in normal sleep/wake cycle will improve Outcome: Progressing

## 2018-08-29 NOTE — Progress Notes (Signed)
Patient spoke with her social worker and doctor about placement and obviously she didn't like what she heard so she was observed walking down to her room and she turned around and yelled out "god damn it". This writer went down to try and calm patient down and she yelled out "I don't care anymore". Patient stated "get your fat ugly ass out of my room, you bitch". This Probation officer administered patient's PRN Geodon.

## 2018-08-29 NOTE — Plan of Care (Signed)
Patient said she still needs to work on her anger issues and that is the only thing holder her back right now.   Problem: Coping: Goal: Ability to demonstrate self-control will improve Outcome: Not Progressing

## 2018-08-30 LAB — GLUCOSE, CAPILLARY
Glucose-Capillary: 221 mg/dL — ABNORMAL HIGH (ref 70–99)
Glucose-Capillary: 58 mg/dL — ABNORMAL LOW (ref 70–99)
Glucose-Capillary: 68 mg/dL — ABNORMAL LOW (ref 70–99)
Glucose-Capillary: 81 mg/dL (ref 70–99)
Glucose-Capillary: 116 mg/dL — ABNORMAL HIGH (ref 70–99)
Glucose-Capillary: 108 mg/dL — ABNORMAL HIGH (ref 70–99)

## 2018-08-30 NOTE — Progress Notes (Signed)
[facility] MD Progress Note  08/30/2018 5:29 PM Jessica [name]  MRN:  [id number] Subjective: Follow-up for this patient with developmental disability and schizoaffective disorder.  As usual she has her ups and downs during the day.  At some point she will be neatly dressed and appropriate but at other points she gets agitated starts yelling and cursing.  Usually right after this she will have a crying fit and take a nap.  Patient is upset about the unfortunate reality that there is still no sign of any plan for discharge Principal Problem: Schizoaffective disorder, bipolar type ([place]) Diagnosis: Principal Problem:   Schizoaffective disorder, bipolar type ([place]) Active Problems:   Diabetes mellitus without complication ([place])   Intellectual disability   Agitation   Borderline personality disorder (HCC)   HTN (hypertension)   Hypothyroidism   Bipolar disorder, unspecified ([place])  Total Time spent with patient: 20 minutes  Past Psychiatric History: Patient has a lifelong history of developmental disability and behavioral disorder  Past Medical History:  Past Medical History:  Diagnosis Date  . Anxiety   . Depression   . Diabetes mellitus without complication ([place])   . GERD (gastroesophageal reflux disease)   . Homicidal ideation   . Hypertension   . Hypothyroidism     Past Surgical History:  Procedure Laterality Date  . COLPOSCOPY    . FRACTURE SURGERY    . TRACHEOSTOMY     Family History: History reviewed. No pertinent family history. Family Psychiatric  History: See previous Social History:  Social History   Substance and Sexual Activity  Alcohol Use No  . Frequency: Never     Social History   Substance and Sexual Activity  Drug Use Yes  . Types: Marijuana, "Crack" cocaine   Comment: reports she has not done anything in a long time    Social History   Socioeconomic History  . Marital status: Single    Spouse name: Not on file  . Number of children: Not on file  . Years  of education: Not on file  . Highest education level: Not on file  Occupational History  . Not on file  Social Needs  . Financial resource strain: Somewhat hard  . Food insecurity    Worry: Patient refused    Inability: Patient refused  . Transportation needs    Medical: Patient refused    Non-medical: Patient refused  Tobacco Use  . Smoking status: Former Research scientist (life sciences)  . Smokeless tobacco: Never Used  Substance and Sexual Activity  . Alcohol use: No    Frequency: Never  . Drug use: Yes    Types: Marijuana, "Crack" cocaine    Comment: reports she has not done anything in a long time  . Sexual activity: Not Currently    Birth control/protection: Abstinence  Lifestyle  . Physical activity    Days per week: Patient refused    Minutes per session: Patient refused  . Stress: Rather much  Relationships  . Social Herbalist on phone: Patient refused    Gets together: Patient refused    Attends religious service: Patient refused    Active member of club or organization: Patient refused    Attends meetings of clubs or organizations: Patient refused    Relationship status: Patient refused  Other Topics Concern  . Not on file  Social History Narrative  . Not on file   Additional Social History:  Sleep: Fair  Appetite:  Fair  Current Medications: Current Facility-Administered Medications  Medication Dose Route Frequency Provider Last Rate Last Dose  . acetaminophen (TYLENOL) tablet 650 mg  650 mg Oral Q6H PRN Money, Lowry Ram, FNP   650 mg at 08/30/18 0606  . alum & mag hydroxide-simeth (MAALOX/MYLANTA) 200-200-20 MG/5ML suspension 30 mL  30 mL Oral Q4H PRN Money, Lowry Ram, FNP      . atorvastatin (LIPITOR) tablet 10 mg  10 mg Oral q1800 Money, Lowry Ram, FNP   10 mg at 08/30/18 1714  . carvedilol (COREG) tablet 3.125 mg  3.125 mg Oral BID WC Sharma Covert, MD   3.125 mg at 08/30/18 1617  . DULoxetine (CYMBALTA) DR capsule 60 mg  60  mg Oral Daily , Madie Reno, MD   60 mg at 08/30/18 0754  . ferrous sulfate tablet 325 mg  325 mg Oral BID WC Money, Lowry Ram, FNP   325 mg at 08/30/18 1617  . gabapentin (NEURONTIN) capsule 900 mg  900 mg Oral TID ,  T, MD   900 mg at 08/30/18 1616  . hydrOXYzine (ATARAX/VISTARIL) tablet 50 mg  50 mg Oral Q6H PRN , Madie Reno, MD   50 mg at 08/20/18 1102  . insulin aspart (novoLOG) injection 0-5 Units  0-5 Units Subcutaneous QHS Money, Lowry Ram, FNP   2 Units at 08/09/18 2142  . insulin aspart (novoLOG) injection 0-9 Units  0-9 Units Subcutaneous TID WC Money, Lowry Ram, FNP   3 Units at 08/30/18 0754  . insulin glargine (LANTUS) injection 15 Units  15 Units Subcutaneous QHS Sharma Covert, MD   15 Units at 08/29/18 2149  . lamoTRIgine (LAMICTAL) tablet 175 mg  175 mg Oral QHS Sharma Covert, MD   175 mg at 08/29/18 2148  . levothyroxine (SYNTHROID) tablet 150 mcg  150 mcg Oral Q0600 Money, Lowry Ram, FNP   150 mcg at 08/30/18 0606  . loratadine (CLARITIN) tablet 10 mg  10 mg Oral Daily , Madie Reno, MD   10 mg at 08/30/18 0752  . LORazepam (ATIVAN) tablet 2 mg  2 mg Oral Q4H PRN , Madie Reno, MD   2 mg at 08/29/18 0650   Or  . LORazepam (ATIVAN) injection 2 mg  2 mg Intramuscular Q4H PRN , Madie Reno, MD   2 mg at 08/30/18 1018  . lurasidone (LATUDA) tablet 80 mg  80 mg Oral BID WC Sharma Covert, MD   80 mg at 08/30/18 1713  . magnesium hydroxide (MILK OF MAGNESIA) suspension 30 mL  30 mL Oral Daily PRN Money, Darnelle Maffucci B, FNP      . metFORMIN (GLUCOPHAGE) tablet 1,000 mg  1,000 mg Oral BID WC Sharma Covert, MD   1,000 mg at 08/30/18 1616  . moxifloxacin (VIGAMOX) 0.5 % ophthalmic solution 1 drop  1 drop Right Eye TID Money, Lowry Ram, FNP   1 drop at 08/30/18 1621  . neomycin-bacitracin-polymyxin (NEOSPORIN) ointment   Topical BID Sharma Covert, MD      . pantoprazole (PROTONIX) EC tablet 40 mg  40 mg Oral Daily Money, Lowry Ram, FNP   40 mg at 08/30/18  0752  . traZODone (DESYREL) tablet 100 mg  100 mg Oral QHS Money, Travis B, FNP   100 mg at 08/29/18 2149  . ziprasidone (GEODON) injection 20 mg  20 mg Intramuscular Q12H PRN , Madie Reno, MD   20 mg at 08/30/18 1019    Lab  Results:  Results for orders placed or performed during the hospital encounter of 08/04/18 (from the past 48 hour(s))  Glucose, capillary     Status: Abnormal   Collection Time: 08/28/18  9:20 PM  Result Value Ref Range   Glucose-Capillary 156 (H) 70 - 99 mg/dL  Glucose, capillary     Status: Abnormal   Collection Time: 08/29/18  6:46 AM  Result Value Ref Range   Glucose-Capillary 116 (H) 70 - 99 mg/dL  Glucose, capillary     Status: Abnormal   Collection Time: 08/29/18 12:49 PM  Result Value Ref Range   Glucose-Capillary 219 (H) 70 - 99 mg/dL  Glucose, capillary     Status: Abnormal   Collection Time: 08/29/18  5:28 PM  Result Value Ref Range   Glucose-Capillary 101 (H) 70 - 99 mg/dL  Glucose, capillary     Status: Abnormal   Collection Time: 08/29/18  9:49 PM  Result Value Ref Range   Glucose-Capillary 116 (H) 70 - 99 mg/dL  Glucose, capillary     Status: Abnormal   Collection Time: 08/30/18  6:59 AM  Result Value Ref Range   Glucose-Capillary 221 (H) 70 - 99 mg/dL  Glucose, capillary     Status: Abnormal   Collection Time: 08/30/18 11:46 AM  Result Value Ref Range   Glucose-Capillary 58 (L) 70 - 99 mg/dL   Comment 1 Notify RN   Glucose, capillary     Status: Abnormal   Collection Time: 08/30/18 12:09 PM  Result Value Ref Range   Glucose-Capillary 68 (L) 70 - 99 mg/dL   Comment 1 Notify RN   Glucose, capillary     Status: None   Collection Time: 08/30/18 12:32 PM  Result Value Ref Range   Glucose-Capillary 81 70 - 99 mg/dL   Comment 1 Document in Chart   Glucose, capillary     Status: Abnormal   Collection Time: 08/30/18  4:12 PM  Result Value Ref Range   Glucose-Capillary 116 (H) 70 - 99 mg/dL    Blood Alcohol level:  Lab Results   Component Value Date   ETH <10 07/19/2018   ETH <10 14/48/1856    Metabolic Disorder Labs: Lab Results  Component Value Date   HGBA1C 5.3 06/05/2018   MPG 105.41 06/05/2018   MPG 102.54 12/30/2017   No results found for: PROLACTIN Lab Results  Component Value Date   CHOL 155 06/05/2018   TRIG 93 06/05/2018   HDL 54 06/05/2018   CHOLHDL 2.9 06/05/2018   VLDL 19 06/05/2018   LDLCALC 82 06/05/2018   [place] 90 12/30/2017    Physical Findings: AIMS:  , ,  ,  ,    CIWA:    COWS:     Musculoskeletal: Strength & Muscle Tone: within normal limits Gait & Station: normal Patient leans: N/A  Psychiatric Specialty Exam: Physical Exam  Nursing note and vitals reviewed. Constitutional: She appears well-developed and well-nourished.  HENT:  Head: Normocephalic and atraumatic.  Eyes: Pupils are equal, round, and reactive to light. Conjunctivae are normal.  Neck: Normal range of motion.  Cardiovascular: Regular rhythm and normal heart sounds.  Respiratory: Effort normal. No respiratory distress.  GI: Soft.  Musculoskeletal: Normal range of motion.  Neurological: She is alert.  Skin: Skin is warm and dry.  Psychiatric: Her affect is labile. Her speech is tangential. She is agitated. Thought content is not paranoid. Cognition and memory are impaired. She expresses impulsivity.    Review of Systems  Constitutional: Negative.  HENT: Negative.   Eyes: Negative.   Respiratory: Negative.   Cardiovascular: Negative.   Gastrointestinal: Negative.   Musculoskeletal: Negative.   Skin: Negative.   Neurological: Negative.   Psychiatric/Behavioral: Negative for depression, hallucinations, memory loss, substance abuse and suicidal ideas. The patient is nervous/anxious. The patient does not have insomnia.     Blood pressure 120/71, pulse 94, temperature 98 F (36.7 C), temperature source Oral, resp. rate 17, height 4\' 10"  (1.473 m), weight 72.6 kg, SpO2 98 %.Body mass index is 33.44  kg/m.  General Appearance: Casual  Eye Contact:  Fair  Speech:  Slow  Volume:  Decreased  Mood:  Anxious and Dysphoric  Affect:  Constricted  Thought Process:  Disorganized  Orientation:  Full (Time, Place, and Person)  Thought Content:  Rumination  Suicidal Thoughts:  No  Homicidal Thoughts:  No  Memory:  Immediate;   Fair Recent;   Poor Remote;   Poor  Judgement:  Impaired  Insight:  Shallow  Psychomotor Activity:  Normal  Concentration:  Concentration: Poor  Recall:  Poor  Fund of Knowledge:  Poor  Language:  Poor  Akathisia:  No  Handed:  Right  AIMS (if indicated):     Assets:  Desire for Improvement Resilience  ADL's:  Intact  Cognition:  Impaired,  Moderate  Sleep:  Number of Hours: 7     Treatment Plan Summary: Plan Spent some time today again trying to talk her down and expressing a lot of empathy for her.  Offered support and advice about managing her anger.  At the time I was talking with her she was lucid and able to listen to all this.  Of course she still had a meltdown later but it could have been much worse.  We are still looking for any kind of discharge plan.  No clear reason for any specific change to medicine today.  Alethia Berthold, MD 08/30/2018, 5:29 PM

## 2018-08-30 NOTE — Progress Notes (Signed)
CSW team contacted the following facilities for placement:  [facility] [phone] - lvm  [place] [phone] - Left message with operator to receive a call back  [facility] [phone] - lvm  [place] with  Blas who requested CSW fax over H&P and FL2 to [phone]. CSW faxed it and fax was successful.  [place] [phone] - phone continuously rung, no voicemail or answer.  [facility] [phone] - phone continuously rung, no voicemail or answer.  [facility] [phone] - "call cannot be completed at this time"  [facility] [phone] - phone continuously rung, then went to dial tone  [facility] [phone] - phone continuously rung, no voicemail or answer.  [facility] [phone] - phone continuously rung, no voicemail or answer.  [facility] [phone] - mailbox full  [facility] [phone] -advised to call [phone] and speak with Neoma Laming or Katharine Look, [place] contacted the number and spoke with Neoma Laming who reported that CSW would have to speak with Ms. Vergie Living. Neoma Laming took CSW number and pt information and reported she would have Ms. Kumarti give CSW a call.  [facility] [phone] - no beds available  [place] [phone] -  CSW advised she would have to speak with Rexene Edison at [phone], [place] called [phone] and spoke with Earlie Server who reported that [facility] is a female hall and that the other agencies through [place] will not take pt due to her behaviors.  [facility] [phone] - no beds available  [facility] handle pts behaviors  [facility] [phone] - female facility, has female beds available  [facility] [phone] - reported no beds available at this time but to call [facility], CSW contacted [facility]  and spoke with Brayton Mars who reported she has a bed in one of her facilities and reported she would fax application to [facility] and would need it filled out and sent back.  [place] [phone] Email FL2, H & P to [email or web address] Emailed sent  [place] -  [phone] Only accepting patients with [place] - [phone]  not in [facility]  not in [facility] - [phone]  no answer  [facility] Hauser A. Aram Candela [phone]   wrong number  [place] [phone]    fax machine  [facility] -[phone]  fax [phone] - beds available, fax FL2, H & P Fax sent   [facility] ([facility]) [facility] - [phone]-must have IDD primary diagnosis in order to qualify  [place] - [phone]- female beds only  [facility] - [phone] - left message  [place] -1 and 2- [phone]- left message   [facility] [place]- left message  [place] - [phone] -left message with Mr. Spero Curb - [phone]- no beds available  [place] - [phone]- left message  [place] - [phone] - wrong number   Evalina Field, MSW, LCSW Clinical Social Work 08/30/2018 3:56 PM

## 2018-08-30 NOTE — Progress Notes (Addendum)
D: Acting  Out behavior   A: Patient attending  goup this am shift . Patient got upset with another patient . Patient  Left group  Went to room . Patient stood in hall  Yelling out Fuck all yall  Patient proceeded to go back to group challenging  Her peer to fight  Stated she was going to kill him . Patient threaten to burn the hospital down " I burn down my mama house " Patient continue to  Curse at peer and staff   " nigger Bitches "Staff escorted pateint back to her room . Patient  Continue to curse at staff ongoing racial slurs  .   R: Patient received Geodon  And Ativan  In left arm  Continued to yell at staff but allowed  IM injection

## 2018-08-30 NOTE — Progress Notes (Signed)
D: Behavior   A:Continue to  have anger behavior and  frustration  issues.   Upset   with placement  staff not being able to fine a Group home  " Im never [place] "  Patient unable to interact with her peers   R: Patient calm down at present time .

## 2018-08-30 NOTE — BHH Counselor (Signed)
CSW contacted the following facilities for group home placement: [facility]-left VM, awaiting response.  [facility] [address]; [place], [place] [address] [phone]  [facility]-left Vm, awaiting response.  [facility]; [place], [place] [address] [phone] [place] with owner Leonel Ramsay, said to call back on 8/12-has a female resident who will be moving out.[facility] [address] [place], [place] [address] [phone]  [facility]-left VM, awaiting response. [facility] [place]; [place], [place] [address] ([place] with Catalina Antigua and Ubaldo Glassing, submitted referral info, awaiting response. Has 2 beds available(1 female 1 female)  [address]; [place], [place] [address] [phone] fax: [phone] [facility]- No female beds available. [facility] [place]; [place], [place] [address] [phone] [facility]-left Vm, awaiting response [place]; [place], [place] [address] [phone]  [facility]-No female bed available. [address]; [place], [place] [address] [phone] [facility]- left vm, awaiting response..[address]; [place], [place] [address] [phone]-left vm, awaiting response. [address]; [place], [place] [address] [phone] [facility] - No bed available..[address]; [place], [place] [address] ([place] -left vm, awaiting response .[address] [place], [place] [address] ([facility] -no vm setup unable to leave vm. [place], [place] [address] [phone]

## 2018-08-30 NOTE — Progress Notes (Signed)
Recreation Therapy Notes  Date: 08/30/2018  Time: 9:30 am  Location: Craft Room  Behavioral response: Inappropriate  Intervention Topic: Coping-skills  Discussion/Intervention:  Group content on today was focused on coping skills. The group defined what coping skills are and when they can be used. Individuals described how they normally cope with thing and the coping skills they normally use. Patients expressed why it is important to cope with things and how not coping with things can affect you. The group participated in the intervention "Exploring coping skills" where they had a chance to test new coping skills they could use in the future.  Clinical Observations/Feedback:  Patient came to group and a peer made unfriendly remarks to patient. Peer was redirected by group facilitator. Participant took a deep breath and took a seat, she eventually got up and left group. Patient returned to group ten minutes later threatening peer. She yelled "you do not tell me what the fuck to do I will mess you up". Patient was yelling "I will kill you" and walking towards peer. Group facilitator redirected patient while security and MHT escorted patient out of group, back to her room.      Harve Spradley LRT/CTRS         Darbi Chandran 08/30/2018 12:03 PM

## 2018-08-30 NOTE — Plan of Care (Signed)
Continue to have anger outburst . Limited interaction with peers , difficulties processing  information taking  her medication Problem: Education: Goal: Knowledge of [facility] Education information/materials will improve 08/30/2018 1406 by Leodis Liverpool, RN Outcome: Not Progressing 08/30/2018 1341 by Leodis Liverpool, RN Outcome: Not Progressing   Problem: Coping: Goal: Ability to verbalize frustrations and anger appropriately will improve 08/30/2018 1406 by Leodis Liverpool, RN Outcome: Not Progressing 08/30/2018 1341 by Leodis Liverpool, RN Outcome: Not Progressing Goal: Ability to demonstrate self-control will improve 08/30/2018 1406 by Leodis Liverpool, RN Outcome: Not Progressing 08/30/2018 1341 by Leodis Liverpool, RN Outcome: Not Progressing   Problem: Safety: Goal: Periods of time without injury will increase 08/30/2018 1406 by Leodis Liverpool, RN Outcome: Not Progressing 08/30/2018 1341 by Leodis Liverpool, RN Outcome: Not Progressing   Problem: Safety: Goal: Ability to redirect hostility and anger into socially appropriate behaviors will improve 08/30/2018 1406 by Leodis Liverpool, RN Outcome: Not Progressing 08/30/2018 1341 by Leodis Liverpool, RN Outcome: Not Progressing   Problem: Activity: Goal: Interest or engagement in leisure activities will improve 08/30/2018 1406 by Leodis Liverpool, RN Outcome: Not Progressing 08/30/2018 1341 by Leodis Liverpool, RN Outcome: Not Progressing Goal: Imbalance in normal sleep/wake cycle will improve 08/30/2018 1406 by Leodis Liverpool, RN Outcome: Progressing 08/30/2018 1341 by Leodis Liverpool, RN Outcome: Not Progressing

## 2018-08-30 NOTE — Plan of Care (Signed)
Patient stated to this writer today, "I need to work on my anger, I don't know why I keep doing this."   Problem: Coping: Goal: Ability to verbalize frustrations and anger appropriately will improve Outcome: Not Progressing

## 2018-08-30 NOTE — Progress Notes (Signed)
D - Patient was in her room upon arrival to the unit. Patient was pleasant during assessment and medication administration. Patient denies SI/HI/AVH, pain, anxiety and depression with this Probation officer.Patient said she had a bad day today but was feeling better this evening. Patient didn't have any outbursts this evening on the unit.   A - Patient compliant with medication administration per MD orders and procedures on the unit. Patient given education. Patient given support and encouragement to be active in her treatment plan. Patient informed to let staff know if there are any issues or problems on the unit. Patient observed interacting appropriately with staff and peers on the unit by this Probation officer.   R - Patient being monitored Q 15 minutes for safety per unit protocol. Patient remains safe on the unit.

## 2018-08-31 LAB — GLUCOSE, CAPILLARY
Glucose-Capillary: 145 mg/dL — ABNORMAL HIGH (ref 70–99)
Glucose-Capillary: 162 mg/dL — ABNORMAL HIGH (ref 70–99)
Glucose-Capillary: 122 mg/dL — ABNORMAL HIGH (ref 70–99)
Glucose-Capillary: 93 mg/dL (ref 70–99)

## 2018-08-31 MED ORDER — GUANFACINE HCL ER 1 MG PO TB24
1.0000 mg | ORAL_TABLET | Freq: Every day | ORAL | Status: DC
  Administered 2018-09-01 – 2018-09-06 (×6): 1 mg via ORAL
  Filled 2018-08-31 (×7): qty 1
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
## 2018-08-31 NOTE — Progress Notes (Signed)
At [place] patient came up to the nurses station stating, "Fuck you Liliane Channel!" Patient assessed but was angry and wouldn't answer any questions with this nurse. Patient then started yelling at peers on the unit, patient redirected. Patient then came and through coffee on the nursing station door stating, "I fucking hate you!"   Patient redirected to her room where she started throwing her blankets and sheets out in the hall. Patient given medications per MD orders. (See MAR)

## 2018-08-31 NOTE — Progress Notes (Signed)
CSW received a callback from [place] [facility] who reported CSW would need to speak with Aftan at [phone] regarding admissions process for all of [place]. CSW contacted [facility] who reported that they have already declined pt for numerous reasons and reported that pt needs to get set up with IDD services and that an ICF placement may be willing to take pt. Aftan encouraged CSW to contact "[facility]".  CSW contacted [facility] at [phone] and left a voicemail.  CSW contacted the following facilities regarding placement for pt: [facility] [phone] - number not in [place] - no beds available  [facility] [place] [place] - lvm  [facility] [phone] - Reported they have beds available, but CSW would need to speak with Rinaldo Cloud [phone]. CSW called [facility] and lvm.  [facility] [place] - only III is a female home and no beds available.  [place] [phone] - informed would need to speak with Christiane Ha at [phone] ext [phone], CSW called Christiane Ha and lvm  [place] [phone] - Phone rung, then went to dial tone  [place] [phone] - phone continuously rung, no voicemail or answer  [place] [phone] - phone continuously rung, no voicemail or answer  [place] [phone] - lvm  [place] [phone] - lvm  [place] [facility] [phone] - Spoke with Lesly Rubenstein who reported there is a female bed  available and CSW would need to speak with Delton See [phone]. CSW called Shirlean Mylar and left a voicemail.   [facility] [place], [place] [phone] - "memory is full. Enter the remote access code" message  [facility] [phone] - Phone rung, then went to [place] [address] [phone] - "voicemail not activated"  [facility] [address] [phone] - lvm  [facility] [phone] - would have to speak with manager, told to call back at 2:00PM  CSW called back and received no response  [facility] [phone] - bed available, faxed info to [phone]  [facility] [phone] - lvm  [facility] [place] [phone] - lvm  CSW followed up with the following agencies:  [facility] [phone] - owner was not available, CSW left a message for her to return call.  [facility] [phone] - Reported he was looking through the information now and asked for CSW to call him back in about 30 mins.   [facility] -[phone]  - reported she has not been by the office yet today so has not reviewed the information sent yesterday, reported she would review it and call CSW back.   Evalina Field, MSW, LCSW Clinical Social Work 08/31/2018 2:18 PM

## 2018-08-31 NOTE — Progress Notes (Signed)
Recreation Therapy Notes  Date: 03/31/2018  Time: 9:30 am   Location: Craft room   Behavioral response: N/A   Intervention Topic: Goals  Discussion/Intervention: Patient did not attend group.   Clinical Observations/Feedback:  Patient did not attend group.   Damire Remedios LRT/CTRS        Miley Lindon 08/31/2018 12:23 PM

## 2018-08-31 NOTE — BHH Group Notes (Signed)
LCSW Group Therapy Note  08/31/2018 1:00 PM  Type of Therapy/Topic:  Group Therapy:  Emotion Regulation  Participation Level:  Did Not Attend   Description of Group:   The purpose of this group is to assist patients in learning to regulate negative emotions and experience positive emotions. Patients will be guided to discuss ways in which they have been vulnerable to their negative emotions. These vulnerabilities will be juxtaposed with experiences of positive emotions or situations, and patients will be challenged to use positive emotions to combat negative ones. Special emphasis will be placed on coping with negative emotions in conflict situations, and patients will process healthy conflict resolution skills.  Therapeutic Goals: 1. Patient will identify two positive emotions or experiences to reflect on in order to balance out negative emotions 2. Patient will label two or more emotions that they find the most difficult to experience 3. Patient will demonstrate positive conflict resolution skills through discussion and/or role plays  Summary of Patient Progress: X  Therapeutic Modalities:   Cognitive Behavioral Therapy Feelings Identification Dialectical Behavioral Therapy  Assunta Curtis, MSW, LCSW 08/31/2018 3:08 PM

## 2018-08-31 NOTE — Progress Notes (Signed)
CSW left a vm for Garnetta at [phone] regarding potential placement for pt.  Evalina Field, MSW, LCSW Clinical Social Work 08/31/2018 10:32 AM

## 2018-08-31 NOTE — Progress Notes (Signed)
CSW spoke with [facility] [phone] who stated they are reviewing pts information and need to speak with pts legal guardian. They reported they would call legal guardian today and for CSW to follow up with pts guardian tomorrow.  Evalina Field, MSW, LCSW Clinical Social Work 08/31/2018 3:22 PM

## 2018-08-31 NOTE — Plan of Care (Signed)
Continue to have anger outburst  threw coffee at staff  unable to identify  ownership. Limited interaction with peers , difficulties processing  information taking  her medication . Problem: Education: Goal: Knowledge of [place] [facility] Education information/materials will improve Outcome: Not Progressing   Problem: Coping: Goal: Ability to verbalize frustrations and anger appropriately will improve Outcome: Not Progressing Goal: Ability to demonstrate self-control will improve Outcome: Not Progressing   Problem: Safety: Goal: Ability to redirect hostility and anger into socially appropriate behaviors will improve Outcome: Not Progressing   Problem: Activity: Goal: Interest or engagement in leisure activities will improve Outcome: Not Progressing Goal: Imbalance in normal sleep/wake cycle will improve Outcome: Not Progressing   Problem: Safety: Goal: Ability to redirect hostility and anger into socially appropriate behaviors will improve Outcome: Not Progressing

## 2018-08-31 NOTE — Progress Notes (Signed)
D: Patient stated slept good last night .Stated appetite is good and energy level  Is normal. Stated concentration is good . Stated on Depression scale 0 , hopeless 0  and anxiety 0 .( low 0-10 high) Denies suicidal  homicidal ideations  .  No auditory hallucinations  No pain concerns . Appropriate ADL'S. Interacting with peers and staff. Continue to have anger outburst  threw coffee at staff  unable to identify  ownership. Limited interaction with peers , difficulties processing  information taking  her medication . Patient   A: Encourage patient participation with unit programming . Instruction  Given on  Medication , verbalize understanding.  R: Voice no other concerns. Staff continue to monitor

## 2018-09-01 LAB — GLUCOSE, CAPILLARY
Glucose-Capillary: 161 mg/dL — ABNORMAL HIGH (ref 70–99)
Glucose-Capillary: 112 mg/dL — ABNORMAL HIGH (ref 70–99)
Glucose-Capillary: 122 mg/dL — ABNORMAL HIGH (ref 70–99)
Glucose-Capillary: 131 mg/dL — ABNORMAL HIGH (ref 70–99)

## 2018-09-01 NOTE — BHH Counselor (Signed)
CSW contacted the following to identify possible group home placement:  [facility], [phone] CSW called and left a HIPAA compliant voicemail.   [place], [phone] CSW was asked to send [facility] and any clinical notes to [phone].  [place], [phone] Not a working number.   [facility], [phone] No answer CSW left HIPAA compliant voicemail.  [facility], [phone] No answer CSW left HIPAA compliant voicemail.  [facility], [phone] No answer CSW left HIPAA compliant voicemail.  [facility], [phone] No answer CSW left HIPAA compliant voicemail.  [facility], [phone] No answer CSW left HIPAA compliant voicemail.  [place], [phone] CSW was informed that she needs to call the office [phone] and ask for [facility].  CSW called [facility] and was informed that they do not have an open room at this time.   Madilyn Fireman, [phone] Number is not in service.  [place], [phone] No beds available.    [place], [phone] CSW asked to call the office [phone] and ask for [facility].  CSW notes that this is the number called previously and was told that there are no beds available.   [place] [facility], [phone] CSW was asked to call the main office at [phone], the [facility] office.  CSW called the number and someone to identified the company as [facility] asked to call [phone].  CSW was informed that they do not offer beds, they are "strictly outpatient".   [facility], [phone] Got a fax like sound when number was called. Called a second time and number rang for some time before message played asking for a remote access code.  [facility], [phone] No answer CSW left HIPAA compliant voicemail.  [facility], [phone] Line rang without the option to leave a  voicemail.  [facility], [phone] No beds available.  [facility], [phone] No beds available.   [address] [phone] Line rang then became a fax like sound.   [facility], [phone] Line rang without the option to leave a voicemail message.   [facility], [phone] No bed available.   [employer], [phone] No bed available.   [place], [phone] Number not in service.  [facility], [phone] No answer CSW left HIPAA compliant voicemail.  [facility], [phone] CSW called and spoke with someone about beds. They reported that "let me go get the staff".  CSW did hear an interaction between two individuals but the line was disconnected.  CSW called back and had to leave a voicemail.  [place] [facility], [phone] No beds available.    [place], [phone] Number is not working.  [facility], [phone] Unable to leave voicemail as the memory is full.  [facility], [phone] No female beds.  [facility], [phone] No female beds.   [employer], [phone] Line rang without option to leave a voicemail message.    [facility], [phone] Number was not working.   Assunta Curtis, MSW, LCSW 09/01/2018 3:35 PM

## 2018-09-01 NOTE — Progress Notes (Signed)
1:1 observation 1900-2300 D: Patient in room. Pleasant and cooperative. Labile at times. Very flirtatious with female staff. Preoccupied with one female staff member in particular. Unpredictable. Denies SI, HI and AVH. Compliant with medication. Needy, attention-seeking and child-like A: continue to monitor 1:1 for safety R: Safety maintained. 0000-0400 D: Resting in bed with eyes closed. Staff within reach. A: Continue 1:1 for safety R: Safety maintained. 97-0700 D: Patient refused her synthroid "I don't want it! Throw it away! Get out of here! I don't trust any of the staff here. I only want to talk with my doctor!" Also refused her CBG. Angry and sullen. Staff within reach. A:Continue 1:1 for safety R: Safety maintained.

## 2018-09-01 NOTE — Plan of Care (Signed)
  Problem: Coping: Goal: Ability to verbalize frustrations and anger appropriately will improve Outcome: Progressing Goal: Ability to demonstrate self-control will improve Outcome: Progressing  D: Patient has been pleasant and cooperative. Denies SI, HI and AVH. Labile and unpredictable. Remains on 1:1 for safety. Needy, child-like and attention seeking.  A: Continue 1:1 for safety. R: Safety maintained.

## 2018-09-01 NOTE — Progress Notes (Signed)
  Facilities Contacted for placement [facility] - [phone]- males only [facility] - [phone] - males only [facility] Ms.  [name] [phone]- left message [facility] - [phone]- no answer Ms. [name]  - [phone]-returned call and stated she would have either Jonette Mate or Fredia Beets (QP) reach out to social work [place] - [phone]- beds available-contact [name] at [phone] [name] is out of the office until Monday, spoke with Jessica [name] and was asked to  fax [facility] and H&P to [phone] for Jessica [name] to review on Monday. Information requested was successfully faxed [facility] - [phone] asked to call back in 30 minutes [place] - [phone] - not in [place] @ [address] - [phone] - no answer Fredia Beets - [phone]- returned call- patient would need to have "minimal behaviors" in order to be placed in her facility [facility] Laurance Flatten- was asked to give her a call back tomorrow regarding the patient

## 2018-09-01 NOTE — Plan of Care (Signed)
D- Patient alert and oriented. Patient presents in a pleasant mood on assessment stating that she's feeling better than she was earlier this morning "I got to do better, that's all I can do you know". Patient also denies SI, HI, AVH, and pain at this time. Patient had no stated goals for today.  A- Scheduled medications administered to patient, per MD orders. Support and encouragement provided.  Routine safety checks conducted every 15 minutes.  Patient informed to notify staff with problems or concerns.  R- No adverse drug reactions noted. Patient contracts for safety at this time. Patient compliant with medications and treatment plan. Patient receptive, calm, and cooperative. Patient interacts well with others on the unit.  Patient remains safe at this time.   Problem: Education: Goal: Knowledge of [place] [facility] Education information/materials will improve Outcome: Progressing   Problem: Coping: Goal: Ability to verbalize frustrations and anger appropriately will improve Outcome: Progressing Goal: Ability to demonstrate self-control will improve Outcome: Progressing   Problem: Safety: Goal: Periods of time without injury will increase Outcome: Progressing   Problem: Safety: Goal: Ability to redirect hostility and anger into socially appropriate behaviors will improve Outcome: Progressing   Problem: Activity: Goal: Interest or engagement in leisure activities will improve Outcome: Progressing Goal: Imbalance in normal sleep/wake cycle will improve Outcome: Progressing

## 2018-09-01 NOTE — Progress Notes (Signed)
[facility] MD Progress Note  09/01/2018 4:43 PM Jessica [name]  MRN:  [id number] Subjective: Patient seen for follow-up.  Patient has no specific new complaints.  Continues to have outbursts of anger and irritability fairly frequently usually at least once a day.  The rest of the time she is calm and usually apologetic for her behavior.  Feels sad about not having a place to live and we talked about that for a while.  I did start her on a low-dose of guanfacine today to see if that might also help with the mood dysregulation Principal Problem: Schizoaffective disorder, bipolar type ([place]) Diagnosis: Principal Problem:   Schizoaffective disorder, bipolar type ([place]) Active Problems:   Diabetes mellitus without complication ([place])   Intellectual disability   Agitation   Borderline personality disorder ([place])   HTN (hypertension)   Hypothyroidism   Bipolar disorder, unspecified ([place])  Total Time spent with patient: 20 minutes  Past Psychiatric History: Past history of developmental disability behavior problems mood instability  Past Medical History:  Past Medical History:  Diagnosis Date  . Anxiety   . Depression   . Diabetes mellitus without complication ([place])   . GERD (gastroesophageal reflux disease)   . Homicidal ideation   . Hypertension   . Hypothyroidism     Past Surgical History:  Procedure Laterality Date  . COLPOSCOPY    . FRACTURE SURGERY    . TRACHEOSTOMY     Family History: History reviewed. No pertinent family history. Family Psychiatric  History: See previous Social History:  Social History   Substance and Sexual Activity  Alcohol Use No  . Frequency: Never     Social History   Substance and Sexual Activity  Drug Use Yes  . Types: Marijuana, "Crack" cocaine   Comment: reports she has not done anything in a long time    Social History   Socioeconomic History  . Marital status: Single    Spouse name: Not on file  . Number of children: Not on file  . Years of  education: Not on file  . Highest education level: Not on file  Occupational History  . Not on file  Social Needs  . Financial resource strain: Somewhat hard  . Food insecurity    Worry: Patient refused    Inability: Patient refused  . Transportation needs    Medical: Patient refused    Non-medical: Patient refused  Tobacco Use  . Smoking status: Former Research scientist (life sciences)  . Smokeless tobacco: Never Used  Substance and Sexual Activity  . Alcohol use: No    Frequency: Never  . Drug use: Yes    Types: Marijuana, "Crack" cocaine    Comment: reports she has not done anything in a long time  . Sexual activity: Not Currently    Birth control/protection: Abstinence  Lifestyle  . Physical activity    Days per week: Patient refused    Minutes per session: Patient refused  . Stress: Rather much  Relationships  . Social Herbalist on phone: Patient refused    Gets together: Patient refused    Attends religious service: Patient refused    Active member of club or organization: Patient refused    Attends meetings of clubs or organizations: Patient refused    Relationship status: Patient refused  Other Topics Concern  . Not on file  Social History Narrative  . Not on file   Additional Social History:  Sleep: Fair  Appetite:  Fair  Current Medications: Current Facility-Administered Medications  Medication Dose Route Frequency Provider Last Rate Last Dose  . acetaminophen (TYLENOL) tablet 650 mg  650 mg Oral Q6H PRN Money, Lowry Ram, FNP   650 mg at 08/30/18 2148  . alum & mag hydroxide-simeth (MAALOX/MYLANTA) 200-200-20 MG/5ML suspension 30 mL  30 mL Oral Q4H PRN Money, Lowry Ram, FNP      . atorvastatin (LIPITOR) tablet 10 mg  10 mg Oral q1800 Money, Lowry Ram, FNP   10 mg at 08/31/18 1655  . carvedilol (COREG) tablet 3.125 mg  3.125 mg Oral BID WC Sharma Covert, MD   3.125 mg at 09/01/18 2841  . DULoxetine (CYMBALTA) DR capsule 60 mg  60 mg  Oral Daily , Madie Reno, MD   60 mg at 09/01/18 0823  . ferrous sulfate tablet 325 mg  325 mg Oral BID WC Money, Lowry Ram, FNP   325 mg at 09/01/18 3244  . gabapentin (NEURONTIN) capsule 900 mg  900 mg Oral TID , Madie Reno, MD   900 mg at 09/01/18 1203  . guanFACINE (INTUNIV) ER tablet 1 mg  1 mg Oral Daily ,  T, MD   1 mg at 09/01/18 0824  . hydrOXYzine (ATARAX/VISTARIL) tablet 50 mg  50 mg Oral Q6H PRN , Madie Reno, MD   50 mg at 08/20/18 1102  . insulin aspart (novoLOG) injection 0-5 Units  0-5 Units Subcutaneous QHS Money, Lowry Ram, FNP   Stopped at 08/31/18 2144  . insulin aspart (novoLOG) injection 0-9 Units  0-9 Units Subcutaneous TID WC Money, Lowry Ram, FNP   2 Units at 09/01/18 [phone]  . insulin glargine (LANTUS) injection 15 Units  15 Units Subcutaneous QHS Sharma Covert, MD   15 Units at 08/31/18 2145  . lamoTRIgine (LAMICTAL) tablet 175 mg  175 mg Oral QHS Sharma Covert, MD   175 mg at 08/31/18 2130  . levothyroxine (SYNTHROID) tablet 150 mcg  150 mcg Oral Q0600 Money, Lowry Ram, FNP   150 mcg at 09/01/18 7253  . loratadine (CLARITIN) tablet 10 mg  10 mg Oral Daily , Madie Reno, MD   10 mg at 09/01/18 0823  . LORazepam (ATIVAN) tablet 2 mg  2 mg Oral Q4H PRN , Madie Reno, MD   2 mg at 08/29/18 0650   Or  . LORazepam (ATIVAN) injection 2 mg  2 mg Intramuscular Q4H PRN , Madie Reno, MD   2 mg at 09/01/18 0741  . lurasidone (LATUDA) tablet 80 mg  80 mg Oral BID WC Sharma Covert, MD   80 mg at 09/01/18 6644  . magnesium hydroxide (MILK OF MAGNESIA) suspension 30 mL  30 mL Oral Daily PRN Money, Darnelle Maffucci B, FNP      . metFORMIN (GLUCOPHAGE) tablet 1,000 mg  1,000 mg Oral BID WC Sharma Covert, MD   1,000 mg at 09/01/18 0347  . moxifloxacin (VIGAMOX) 0.5 % ophthalmic solution 1 drop  1 drop Right Eye TID Money, Lowry Ram, FNP   1 drop at 09/01/18 1203  . neomycin-bacitracin-polymyxin (NEOSPORIN) ointment   Topical BID Sharma Covert, MD      .  pantoprazole (PROTONIX) EC tablet 40 mg  40 mg Oral Daily Money, Lowry Ram, FNP   40 mg at 09/01/18 4259  . traZODone (DESYREL) tablet 100 mg  100 mg Oral QHS Money, Travis B, FNP   100 mg at 08/31/18 2131  . ziprasidone (GEODON) injection  20 mg  20 mg Intramuscular Q12H PRN , Madie Reno, MD   20 mg at 09/01/18 0741    Lab Results:  Results for orders placed or performed during the hospital encounter of 08/04/18 (from the past 48 hour(s))  Glucose, capillary     Status: Abnormal   Collection Time: 08/30/18  9:28 PM  Result Value Ref Range   Glucose-Capillary 108 (H) 70 - 99 mg/dL  Glucose, capillary     Status: Abnormal   Collection Time: 08/31/18  7:31 AM  Result Value Ref Range   Glucose-Capillary 145 (H) 70 - 99 mg/dL  Glucose, capillary     Status: Abnormal   Collection Time: 08/31/18 11:28 AM  Result Value Ref Range   Glucose-Capillary 162 (H) 70 - 99 mg/dL   Comment 1 Notify RN   Glucose, capillary     Status: Abnormal   Collection Time: 08/31/18  4:22 PM  Result Value Ref Range   Glucose-Capillary 122 (H) 70 - 99 mg/dL   Comment 1 Notify RN   Glucose, capillary     Status: None   Collection Time: 08/31/18  9:39 PM  Result Value Ref Range   Glucose-Capillary 93 70 - 99 mg/dL  Glucose, capillary     Status: Abnormal   Collection Time: 09/01/18  7:29 AM  Result Value Ref Range   Glucose-Capillary 161 (H) 70 - 99 mg/dL   Comment 1 Notify RN   Glucose, capillary     Status: Abnormal   Collection Time: 09/01/18 12:01 PM  Result Value Ref Range   Glucose-Capillary 112 (H) 70 - 99 mg/dL  Glucose, capillary     Status: Abnormal   Collection Time: 09/01/18  4:12 PM  Result Value Ref Range   Glucose-Capillary 122 (H) 70 - 99 mg/dL   Comment 1 Notify RN     Blood Alcohol level:  Lab Results  Component Value Date   ETH <10 07/19/2018   ETH <10 17/61/6073    Metabolic Disorder Labs: Lab Results  Component Value Date   HGBA1C 5.3 06/05/2018   MPG 105.41 06/05/2018    MPG 102.54 12/30/2017   No results found for: PROLACTIN Lab Results  Component Value Date   CHOL 155 06/05/2018   TRIG 93 06/05/2018   HDL 54 06/05/2018   CHOLHDL 2.9 06/05/2018   VLDL 19 06/05/2018   LDLCALC 82 06/05/2018   Clara 90 12/30/2017    Physical Findings: AIMS:  , ,  ,  ,    CIWA:    COWS:     Musculoskeletal: Strength & Muscle Tone: within normal limits Gait & Station: normal Patient leans: N/A  Psychiatric Specialty Exam: Physical Exam  Nursing note and vitals reviewed. Constitutional: She appears well-developed and well-nourished.  HENT:  Head: Normocephalic and atraumatic.  Eyes: Pupils are equal, round, and reactive to light. Conjunctivae are normal.  Neck: Normal range of motion.  Cardiovascular: Regular rhythm and normal heart sounds.  Respiratory: Effort normal. No respiratory distress.  GI: Soft.  Musculoskeletal: Normal range of motion.  Neurological: She is alert.  Skin: Skin is warm and dry.  Psychiatric: Her affect is blunt. Her speech is delayed. She is slowed. Cognition and memory are impaired. She expresses impulsivity. She expresses no homicidal and no suicidal ideation.    Review of Systems  Constitutional: Negative.   HENT: Negative.   Eyes: Negative.   Respiratory: Negative.   Cardiovascular: Negative.   Gastrointestinal: Negative.   Musculoskeletal: Negative.   Skin: Negative.  Neurological: Negative.   Psychiatric/Behavioral: Positive for depression. Negative for hallucinations, memory loss, substance abuse and suicidal ideas. The patient is not nervous/anxious and does not have insomnia.     Blood pressure 131/64, pulse 75, temperature 98 F (36.7 C), temperature source Oral, resp. rate 17, height 4\' 10"  (1.473 m), weight 72.6 kg, SpO2 99 %.Body mass index is 33.44 kg/m.  General Appearance: Casual  Eye Contact:  Fair  Speech:  Slow  Volume:  Decreased  Mood:  Dysphoric  Affect:  Congruent  Thought Process:  Coherent   Orientation:  Full (Time, Place, and Person)  Thought Content:  Logical  Suicidal Thoughts:  No  Homicidal Thoughts:  No  Memory:  Immediate;   Fair Recent;   Fair Remote;   Fair  Judgement:  Impaired  Insight:  Fair  Psychomotor Activity:  Decreased  Concentration:  Concentration: Fair  Recall:  [employer] of Knowledge:  Fair  Language:  Fair  Akathisia:  No  Handed:  Right  AIMS (if indicated):     Assets:  Desire for Improvement  ADL's:  Impaired  Cognition:  Impaired,  Mild  Sleep:  Number of Hours: 7.25     Treatment Plan Summary: Daily contact with patient to assess and evaluate symptoms and progress in treatment, Medication management and Plan No change to medicine except adding 1 mg of guanfacine.  Discussed this with the patient who has no complaint.  Continue supportive daily therapy.  Nursing can make ongoing decisions about whether she can be interacting with the other patients depending on her behavior  Alethia Berthold, MD 09/01/2018, 4:43 PM

## 2018-09-01 NOTE — BHH Counselor (Signed)
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 Jessica [name] states she is scheduled to follow up with [facility] on Tuesday to find out if they will assign the pt a care coordinator. In addition she says she has been instructed by her supervisor to file a complaint against [facility] if they do not assist or provide the pt with services. Also, she says she will contact Disability Rights if necessary. Jessica [name] reports [facility] is requesting letterhead from the physician that states the pt needs the highest level of care in the community due to behavior displayed and diagnosis and a copy of H&P faxed to [phone]. CSW will contact J. Clapacs to inform him of what is needed.

## 2018-09-01 NOTE — Progress Notes (Signed)
Recreation Therapy Notes  Date: 04/01/2018  Time: 9:30 am   Location: Craft room   Behavioral response: N/A   Intervention Topic: Happiness  Discussion/Intervention: Patient did not attend group.   Clinical Observations/Feedback:  Patient did not attend group.   Ceazia Harb LRT/CTRS        Katriana Dortch 09/01/2018 11:33 AM

## 2018-09-01 NOTE — BHH Counselor (Signed)
CSW faxed supporting documentation to [facility] and requested that the patient be considered for the priority list.    CSW received confirmation that fax was successful.  CSW has not received a return call at this time.   Assunta Curtis, MSW, LCSW 09/01/2018 1:48 PM

## 2018-09-01 NOTE — BHH Group Notes (Signed)
CSW spoke with [place] Blas, [phone] who reports that she will not be able to accept the patient into the group home at this time.   Assunta Curtis, MSW, LCSW 09/01/2018 1:03 PM

## 2018-09-01 NOTE — BHH Group Notes (Signed)
Balance In Life 09/01/2018 9:30AM/1PM  Type of Therapy/Topic:  Group Therapy:  Balance in Life  Participation Level:  Active  Description of Group:   This group will address the concept of balance and how it feels and looks when one is unbalanced. Patients will be encouraged to process areas in their lives that are out of balance and identify reasons for remaining unbalanced. Facilitators will guide patients in utilizing problem-solving interventions to address and correct the stressor making their life unbalanced. Understanding and applying boundaries will be explored and addressed for obtaining and maintaining a balanced life. Patients will be encouraged to explore ways to assertively make their unbalanced needs known to significant others in their lives, using other group members and facilitator for support and feedback.  Therapeutic Goals: 1. Patient will identify two or more emotions or situations they have that consume much of in their lives. 2. Patient will identify signs/triggers that life has become out of balance:  3. Patient will identify two ways to set boundaries in order to achieve balance in their lives:  4. Patient will demonstrate ability to communicate their needs through discussion and/or role plays  Summary of Patient Progress:  Actively and appropriately engaged in the group. Pt states she will continue working on managing her anger as a way of finding balance. Pt identified swimming, camping and playing tennis as healthy coping methods.  Therapeutic Modalities:   Cognitive Behavioral Therapy Solution-Focused Therapy Assertiveness Training  Ewen Varnell Lynelle Smoke, LCSW
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
## 2018-09-01 NOTE — Progress Notes (Signed)
1:1 Patient Hourly Rounding  1000: Patient is asleep in her room with her assigned safety sitter present at bedside.  1400: Patient is in her room, making up her bed, with her assigned safety sitter present at bedside.  1800: Patient is in her room, in the shower, with her assigned safety sitter present at her bedside.

## 2018-09-02 LAB — GLUCOSE, CAPILLARY
Glucose-Capillary: 244 mg/dL — ABNORMAL HIGH (ref 70–99)
Glucose-Capillary: 79 mg/dL (ref 70–99)
Glucose-Capillary: 133 mg/dL — ABNORMAL HIGH (ref 70–99)
Glucose-Capillary: 132 mg/dL — ABNORMAL HIGH (ref 70–99)

## 2018-09-02 NOTE — BHH Counselor (Signed)
CSW received phone call from [place] at [facility] about the status of the pt referral. According to Gwyndolyn Saxon, the case was staffed by Dr. Arbie Cookey Vanderzwag(Deputy Chief Medical Officer) and she does not feel like the patient meets the criteria for high prioritization. He states on one of J.Clapacs notes he says "he will not change the patients medication and when her frustration goes down her behaviors will be extinguished." CSW's will continue to pursue group home placement for the patient.

## 2018-09-02 NOTE — BHH Counselor (Signed)
CSW received return call from [place] at [place] reports "at best" the patient "meets minimum requirement for placement on the list.  CSW clarified if Gwyndolyn Saxon meant the regular list or the priority list and was informed that this was in reference to the regular list "there is not a list for female priority at this time" CSW was informed.  Gwyndolyn Saxon reports "she is not doing what the others on the priority list is doing nor the regular list and she can not move any higher than where she is currently placed".  Gwyndolyn Saxon read a note completed by Dr. Weber Cooks on 08/23/2018 that stated "Daily contact with patient to assess and evaluate symptoms and progress in treatment, Medication management and Plan No change to underlying medication.  I do not think anything will change this tendency of hers to occasionally have major outbursts when frustrated.  She is otherwise functioning well and she bounced back from this pretty easily.  We are still looking for placement."  Gwyndolyn Saxon notes that this in essence hurts the patients ability to move on the list as this appears to be "a placmeent issue and not a mental health problem."    Gwyndolyn Saxon offered to "bump it up" to the Unit Manager/Director Dr. Kennyth Lose and have her review for placement on priority list for a second opinion.  He reports that this will take some days but he will follow up once she has reviewed.  Assunta Curtis, MSW, LCSW 09/02/2018 10:33 AM

## 2018-09-02 NOTE — Progress Notes (Signed)
Recreation Therapy Notes   Date: 09/02/2018  Time: 9:30 am   Location: Craft room   Behavioral response: N/A   Intervention Topic: Decision-Making   Discussion/Intervention: Patient did not attend group.   Clinical Observations/Feedback:  Patient did not attend group.   Rylynne Schicker LRT/CTRS        Venetia Prewitt 09/02/2018 10:55 AM

## 2018-09-02 NOTE — Plan of Care (Signed)
D: Pt. During assessments denies si/hi/avh, able to contract for safety. Pt. Endorses anxiety, given PRN medication for this.   A: Q x 4 observation checks in place for safety. Patient was provided with education.  Patient given/offered medications per orders. Patient  was encouraged to attend groups, participate in unit activities and continue with plan of care. Pt. Chart and plans of care reviewed. Pt. Given support and encouragement.   R: Patient is complaint with medication and unit procedures with direction and encouragement. Pt. Has been more appropriate with staff and peers. Pt. Observed eating good. Pt. Participation with activities improved some. Pt. Verbalizes and demonstrates utilization of coping skills to control anger and overall mood today when she has gotten upset, regarding her discharge planning. Pt. Blood sugars monitored per MD orders. Pt. Monitored via 1:1 observer for safety, but will work with treatment team to evaluate if still needed, due to what appears to be improving behavior overall.            Problem: Education: Goal: Knowledge of [place] [facility] Education information/materials will improve Outcome: Progressing   Problem: Coping: Goal: Ability to verbalize frustrations and anger appropriately will improve Outcome: Progressing Goal: Ability to demonstrate self-control will improve Outcome: Progressing   Problem: Safety: Goal: Periods of time without injury will increase Outcome: Progressing   Problem: Activity: Goal: Interest or engagement in leisure activities will improve Outcome: Progressing

## 2018-09-02 NOTE — Progress Notes (Signed)
[facility] MD Progress Note  09/02/2018 6:16 PM GEORGENE KOPPER  MRN:  [id number] Subjective: Follow-up for patient with schizoaffective disorder and developmental disability.  No new complaints.  Sat down and talk with me for a while mostly just about how she hopes that people are noticing that she is making an effort to control her behavior.  She seems not to have had as many outburst today as previously.  So far nothing that I have seen that has been really disruptive.  No complaints about medicine. Principal Problem: Schizoaffective disorder, bipolar type ([place]) Diagnosis: Principal Problem:   Schizoaffective disorder, bipolar type ([place]) Active Problems:   Diabetes mellitus without complication ([place])   Intellectual disability   Agitation   Borderline personality disorder (H[facility])   HTN (hypertension)   Hypothyroidism   Bipolar disorder, unspecified ([place])  Total Time spent with patient: 20 minutes  Past Psychiatric History: Past history of developmental disability schizoaffective disorder diagnosis behavior problems  Past Medical History:  Past Medical History:  Diagnosis Date  . Anxiety   . Depression   . Diabetes mellitus without complication ([place])   . GERD (gastroesophageal reflux disease)   . Homicidal ideation   . Hypertension   . Hypothyroidism     Past Surgical History:  Procedure Laterality Date  . COLPOSCOPY    . FRACTURE SURGERY    . TRACHEOSTOMY     Family History: History reviewed. No pertinent family history. Family Psychiatric  History: See previous Social History:  Social History   Substance and Sexual Activity  Alcohol Use No  . Frequency: Never     Social History   Substance and Sexual Activity  Drug Use Yes  . Types: Marijuana, "Crack" cocaine   Comment: reports she has not done anything in a long time    Social History   Socioeconomic History  . Marital status: Single    Spouse name: Not on file  . Number of children: Not on file  . Years of  education: Not on file  . Highest education level: Not on file  Occupational History  . Not on file  Social Needs  . Financial resource strain: Somewhat hard  . Food insecurity    Worry: Patient refused    Inability: Patient refused  . Transportation needs    Medical: Patient refused    Non-medical: Patient refused  Tobacco Use  . Smoking status: Former Research scientist (life sciences)  . Smokeless tobacco: Never Used  Substance and Sexual Activity  . Alcohol use: No    Frequency: Never  . Drug use: Yes    Types: Marijuana, "Crack" cocaine    Comment: reports she has not done anything in a long time  . Sexual activity: Not Currently    Birth control/protection: Abstinence  Lifestyle  . Physical activity    Days per week: Patient refused    Minutes per session: Patient refused  . Stress: Rather much  Relationships  . Social Herbalist on phone: Patient refused    Gets together: Patient refused    Attends religious service: Patient refused    Active member of club or organization: Patient refused    Attends meetings of clubs or organizations: Patient refused    Relationship status: Patient refused  Other Topics Concern  . Not on file  Social History Narrative  . Not on file   Additional Social History:  Sleep: Fair  Appetite:  Fair  Current Medications: Current Facility-Administered Medications  Medication Dose Route Frequency Provider Last Rate Last Dose  . acetaminophen (TYLENOL) tablet 650 mg  650 mg Oral Q6H PRN Money, Lowry Ram, FNP   650 mg at 09/02/18 6333  . alum & mag hydroxide-simeth (MAALOX/MYLANTA) 200-200-20 MG/5ML suspension 30 mL  30 mL Oral Q4H PRN Money, Lowry Ram, FNP      . atorvastatin (LIPITOR) tablet 10 mg  10 mg Oral q1800 Money, Lowry Ram, FNP   10 mg at 09/02/18 1756  . carvedilol (COREG) tablet 3.125 mg  3.125 mg Oral BID WC Sharma Covert, MD   3.125 mg at 09/02/18 1612  . DULoxetine (CYMBALTA) DR capsule 60 mg  60 mg  Oral Daily Maverik Foot, Madie Reno, MD   60 mg at 09/02/18 0810  . ferrous sulfate tablet 325 mg  325 mg Oral BID WC Money, Lowry Ram, FNP   325 mg at 09/02/18 1612  . gabapentin (NEURONTIN) capsule 900 mg  900 mg Oral TID Spiro Ausborn, Madie Reno, MD   900 mg at 09/02/18 1612  . guanFACINE (INTUNIV) ER tablet 1 mg  1 mg Oral Daily Maisey Deandrade T, MD   1 mg at 09/02/18 0810  . hydrOXYzine (ATARAX/VISTARIL) tablet 50 mg  50 mg Oral Q6H PRN Lavarius Doughten, Madie Reno, MD   50 mg at 09/02/18 0736  . insulin aspart (novoLOG) injection 0-5 Units  0-5 Units Subcutaneous QHS Money, Lowry Ram, FNP   Stopped at 08/31/18 2144  . insulin aspart (novoLOG) injection 0-9 Units  0-9 Units Subcutaneous TID WC Money, Lowry Ram, FNP   1 Units at 09/02/18 1620  . insulin glargine (LANTUS) injection 15 Units  15 Units Subcutaneous QHS Sharma Covert, MD   15 Units at 09/01/18 2129  . lamoTRIgine (LAMICTAL) tablet 175 mg  175 mg Oral QHS Sharma Covert, MD   175 mg at 09/01/18 2128  . levothyroxine (SYNTHROID) tablet 150 mcg  150 mcg Oral Q0600 Money, Lowry Ram, FNP   150 mcg at 09/02/18 5456  . loratadine (CLARITIN) tablet 10 mg  10 mg Oral Daily Omari Mcmanaway, Madie Reno, MD   10 mg at 09/02/18 0811  . LORazepam (ATIVAN) tablet 2 mg  2 mg Oral Q4H PRN Trinette Vera, Madie Reno, MD   2 mg at 09/02/18 1612   Or  . LORazepam (ATIVAN) injection 2 mg  2 mg Intramuscular Q4H PRN Orva Gwaltney, Madie Reno, MD   2 mg at 09/01/18 0741  . lurasidone (LATUDA) tablet 80 mg  80 mg Oral BID WC Sharma Covert, MD   80 mg at 09/02/18 1613  . magnesium hydroxide (MILK OF MAGNESIA) suspension 30 mL  30 mL Oral Daily PRN Money, Darnelle Maffucci B, FNP      . metFORMIN (GLUCOPHAGE) tablet 1,000 mg  1,000 mg Oral BID WC Sharma Covert, MD   1,000 mg at 09/02/18 1612  . moxifloxacin (VIGAMOX) 0.5 % ophthalmic solution 1 drop  1 drop Right Eye TID Money, Lowry Ram, FNP   1 drop at 09/02/18 1613  . neomycin-bacitracin-polymyxin (NEOSPORIN) ointment   Topical BID Sharma Covert, MD      .  pantoprazole (PROTONIX) EC tablet 40 mg  40 mg Oral Daily Money, Lowry Ram, FNP   40 mg at 09/02/18 2563  . traZODone (DESYREL) tablet 100 mg  100 mg Oral QHS Money, Travis B, FNP   100 mg at 09/01/18 2128  . ziprasidone (GEODON) injection  20 mg  20 mg Intramuscular Q12H PRN Hala Narula, Madie Reno, MD   20 mg at 09/01/18 0741    Lab Results:  Results for orders placed or performed during the hospital encounter of 08/04/18 (from the past 48 hour(s))  Glucose, capillary     Status: None   Collection Time: 08/31/18  9:39 PM  Result Value Ref Range   Glucose-Capillary 93 70 - 99 mg/dL  Glucose, capillary     Status: Abnormal   Collection Time: 09/01/18  7:29 AM  Result Value Ref Range   Glucose-Capillary 161 (H) 70 - 99 mg/dL   Comment 1 Notify RN   Glucose, capillary     Status: Abnormal   Collection Time: 09/01/18 12:01 PM  Result Value Ref Range   Glucose-Capillary 112 (H) 70 - 99 mg/dL  Glucose, capillary     Status: Abnormal   Collection Time: 09/01/18  4:12 PM  Result Value Ref Range   Glucose-Capillary 122 (H) 70 - 99 mg/dL   Comment 1 Notify RN   Glucose, capillary     Status: Abnormal   Collection Time: 09/01/18  9:24 PM  Result Value Ref Range   Glucose-Capillary 131 (H) 70 - 99 mg/dL  Glucose, capillary     Status: Abnormal   Collection Time: 09/02/18  6:57 AM  Result Value Ref Range   Glucose-Capillary 244 (H) 70 - 99 mg/dL   Comment 1 Notify RN   Glucose, capillary     Status: None   Collection Time: 09/02/18 12:18 PM  Result Value Ref Range   Glucose-Capillary 79 70 - 99 mg/dL  Glucose, capillary     Status: Abnormal   Collection Time: 09/02/18  4:07 PM  Result Value Ref Range   Glucose-Capillary 133 (H) 70 - 99 mg/dL   Comment 1 Notify RN     Blood Alcohol level:  Lab Results  Component Value Date   ETH <10 07/19/2018   ETH <10 67/12/4578    Metabolic Disorder Labs: Lab Results  Component Value Date   HGBA1C 5.3 06/05/2018   MPG 105.41 06/05/2018   MPG  102.54 12/30/2017   No results found for: PROLACTIN Lab Results  Component Value Date   CHOL 155 06/05/2018   TRIG 93 06/05/2018   HDL 54 06/05/2018   CHOLHDL 2.9 06/05/2018   VLDL 19 06/05/2018   LDLCALC 82 06/05/2018   [place] 90 12/30/2017    Physical Findings: AIMS:  , ,  ,  ,    CIWA:    COWS:     Musculoskeletal: Strength & Muscle Tone: within normal limits Gait & Station: normal Patient leans: N/A  Psychiatric Specialty Exam: Physical Exam  Nursing note and vitals reviewed. Constitutional: She appears well-developed and well-nourished.  HENT:  Head: Normocephalic and atraumatic.  Eyes: Pupils are equal, round, and reactive to light. Conjunctivae are normal.  Neck: Normal range of motion.  Cardiovascular: Regular rhythm and normal heart sounds.  Respiratory: Effort normal. No respiratory distress.  GI: Soft.  Musculoskeletal: Normal range of motion.  Neurological: She is alert.  Skin: Skin is warm and dry.  Psychiatric: Her affect is blunt. Her speech is delayed. She is slowed. Thought content is not paranoid. Cognition and memory are impaired. She expresses impulsivity. She expresses no homicidal and no suicidal ideation. She exhibits abnormal recent memory.    Review of Systems  Constitutional: Negative.   HENT: Negative.   Eyes: Negative.   Respiratory: Negative.   Cardiovascular: Negative.   Gastrointestinal:  Negative.   Musculoskeletal: Negative.   Skin: Negative.   Neurological: Negative.   Psychiatric/Behavioral: Negative for depression, hallucinations, substance abuse and suicidal ideas. The patient is nervous/anxious.     Blood pressure 132/71, pulse 77, temperature 98.1 F (36.7 C), temperature source Oral, resp. rate 18, height 4\' 10"  (1.473 m), weight 72.6 kg, SpO2 96 %.Body mass index is 33.44 kg/m.  General Appearance: Casual and Fairly Groomed  Eye Contact:  Fair  Speech:  Slow  Volume:  Decreased  Mood:  Dysphoric  Affect:  Congruent   Thought Process:  Goal Directed  Orientation:  Full (Time, Place, and Person)  Thought Content:  Logical  Suicidal Thoughts:  No  Homicidal Thoughts:  No  Memory:  Immediate;   Fair Recent;   Poor Remote;   Fair  Judgement:  Fair  Insight:  Fair  Psychomotor Activity:  Decreased  Concentration:  Concentration: Fair  Recall:  [employer] of Knowledge:  Fair  Language:  Fair  Akathisia:  No  Handed:  Right  AIMS (if indicated):     Assets:  Desire for Improvement  ADL's:  Impaired  Cognition:  Impaired,  Mild  Sleep:  Number of Hours: 6.75     Treatment Plan Summary: Daily contact with patient to assess and evaluate symptoms and progress in treatment, Medication management and Plan No change to medicine today.  Encouragement supportive therapy lots of praise for her good behavior.  Reassured her that we are still working on discharge planning and will notify her as far in advance as we can when we find anything.  Alethia Berthold, MD 09/02/2018, 6:16 PM

## 2018-09-02 NOTE — Progress Notes (Signed)
1:1 Observation Note  Patient is observed in the hallway with 1:1 present for safety. Pt. In no visible distress. Will continue to monitor for safety.

## 2018-09-02 NOTE — Progress Notes (Signed)
Patient remains 1:1 for safety with a sitter present. Patient compliant with medication administration, per MD orders and procedures on the unit. Patient has not had any outbursts on the unit but does flip for almost no reason. Patient remains safe on the unit.

## 2018-09-02 NOTE — Progress Notes (Signed)
Patient remains 1:1 for safety with a sitter present. Patient is asleep and without complaint. Patient remains safe on the unit.

## 2018-09-02 NOTE — B[facility] Group Notes (Signed)
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
## 2018-09-02 NOTE — Plan of Care (Signed)
Patient had a good night but did have to get PRN meds for her behavior this afternoon.   Problem: Coping: Goal: Ability to demonstrate self-control will improve Outcome: Not Progressing

## 2018-09-02 NOTE — Progress Notes (Signed)
1:1 Observation Note  Patient observed resting at this time with 1:1 present for safety. No distress noted. Respirations even and unlabored.

## 2018-09-02 NOTE — Progress Notes (Addendum)
Patient on 1:1 for safety with a sitter present. Patient is asleep and without complaint. Patient remains safe on the unit.  

## 2018-09-03 LAB — GLUCOSE, CAPILLARY
Glucose-Capillary: 134 mg/dL — ABNORMAL HIGH (ref 70–99)
Glucose-Capillary: 158 mg/dL — ABNORMAL HIGH (ref 70–99)
Glucose-Capillary: 114 mg/dL — ABNORMAL HIGH (ref 70–99)

## 2018-09-03 NOTE — Progress Notes (Signed)
1:1 Continuous  4-7 4  Patient in courtyard  Sitting at bench 5 Patient in dayroom  Sitting at table 6 In room in bathroom 7 In room in  [place]

## 2018-09-03 NOTE — Progress Notes (Signed)
D: Patient stated slept good last night .Stated appetite is good and energy level  Is normal. Stated concentration is good . Stated on Depression scale 0, hopeless  0 and anxiety 0 .( low 0-10 high) Denies suicidal  homicidal ideations  .  No auditory hallucinations  No pain concerns . Appropriate ADL'S. Interacting with peers and staff. Continue to have anger outburst   unable to identify  ownership. Limited interaction with peers , difficulties processing  information Compliant  with  taking  her medication . Patient remains on 1:1  Periods of coming to staff saying she is giving up  A: Encourage patient participation with unit programming . Instruction  Given on  Medication , verbalize understanding.  R: Voice no other concerns. Staff continue to monitor

## 2018-09-03 NOTE — [facility] Group Notes (Signed)
LCSW Group Therapy Note   09/03/2018 1:15pm   Type of Therapy and Topic:  Group Therapy:  Trust and Honesty  Participation Level:  Active  Description of Group:    In this group patients will be asked to explore the value of being honest.  Patients will be guided to discuss their thoughts, feelings, and behaviors related to honesty and trusting in others. Patients will process together how trust and honesty relate to forming relationships with peers, family members, and self. Each patient will be challenged to identify and express feelings of being vulnerable. Patients will discuss reasons why people are dishonest and identify alternative outcomes if one was truthful (to self or others). This group will be process-oriented, with patients participating in exploration of their own experiences, giving and receiving support, and processing challenge from other group members.   Therapeutic Goals: 1. Patient will identify why honesty is important to relationships and how honesty overall affects relationships.  2. Patient will identify a situation where they lied or were lied too and the  feelings, thought process, and behaviors surrounding the situation 3. Patient will identify the meaning of being vulnerable, how that feels, and how that correlates to being honest with self and others. 4. Patient will identify situations where they could have told the truth, but instead lied and explain reasons of dishonesty.   Summary of Patient Progress The patient was able to explore the value of being honest.  Patient discussed thoughts, feelings, and behaviors related to honesty and trusting in others. The patient processed together with other group members how trust and honesty relate to forming relationships with peers, family members, and self. Pt actively and appropriately engaged in the group. Patient was able to provide support and validation to other group members. Patient practiced active listening when  interacting with the facilitator and other group members.    Therapeutic Modalities:   Cognitive Behavioral Therapy Solution Focused Therapy Motivational Interviewing Brief Therapy  Jerimah Witucki  CUEBAS-COLON, LCSW 09/03/2018 12:43 PM

## 2018-09-03 NOTE — Progress Notes (Signed)
Patient is improving and coping well with her behaviors , there has being no out burst as of recent  And taking her medications, without any noticeable side effects, social activity is encouraged and education  is provided, safety and behavior expectations are discussed, only requiring 15 minutes safety checks . Patient denies any SI/HI/AVH.

## 2018-09-03 NOTE — Progress Notes (Signed)
[facility] MD Progress Note  09/03/2018 1:41 PM Jessica [name]  MRN:  [id number]   Jessica [name] is a 53yo F with psych h/o moderate intellectual disability, personality disorder, Schizoaffective disorder, who was admitted to [facility] due to agitation at group home.   Patient seen.  Chart reviewed. Patient discussed with nursing; no unsafe overnight events reported.  Subjective:   Patient appears calm. She states "I am still here". She says she focused on positive thoughts and writes sticky notes with positive content to herself. She reports "okay" mood, denies any thoughts of harming self or others. She is frustrated due to her placement problems. She is religious. She denies having any side effects from medications.    Principal Problem: Schizoaffective disorder, bipolar type ([place]) Diagnosis: Principal Problem:   Schizoaffective disorder, bipolar type ([place]) Active Problems:   Diabetes mellitus without complication ([place])   Intellectual disability   Agitation   Borderline personality disorder (HCC)   HTN (hypertension)   Hypothyroidism   Bipolar disorder, unspecified ([place])  Total Time spent with patient:15 min  Past Psychiatric History: see H&P  Past Medical History:  Past Medical History:  Diagnosis Date  . Anxiety   . Depression   . Diabetes mellitus without complication ([place])   . GERD (gastroesophageal reflux disease)   . Homicidal ideation   . Hypertension   . Hypothyroidism     Past Surgical History:  Procedure Laterality Date  . COLPOSCOPY    . FRACTURE SURGERY    . TRACHEOSTOMY     Family History: History reviewed. No pertinent family history. Family Psychiatric  History: see H&P Social History:  Social History   Substance and Sexual Activity  Alcohol Use No  . Frequency: Never     Social History   Substance and Sexual Activity  Drug Use Yes  . Types: Marijuana, "Crack" cocaine   Comment: reports she has not done anything in a long time    Social History    Socioeconomic History  . Marital status: Single    Spouse name: Not on file  . Number of children: Not on file  . Years of education: Not on file  . Highest education level: Not on file  Occupational History  . Not on file  Social Needs  . Financial resource strain: Somewhat hard  . Food insecurity    Worry: Patient refused    Inability: Patient refused  . Transportation needs    Medical: Patient refused    Non-medical: Patient refused  Tobacco Use  . Smoking status: Former Research scientist (life sciences)  . Smokeless tobacco: Never Used  Substance and Sexual Activity  . Alcohol use: No    Frequency: Never  . Drug use: Yes    Types: Marijuana, "Crack" cocaine    Comment: reports she has not done anything in a long time  . Sexual activity: Not Currently    Birth control/protection: Abstinence  Lifestyle  . Physical activity    Days per week: Patient refused    Minutes per session: Patient refused  . Stress: Rather much  Relationships  . Social Herbalist on phone: Patient refused    Gets together: Patient refused    Attends religious service: Patient refused    Active member of club or organization: Patient refused    Attends meetings of clubs or organizations: Patient refused    Relationship status: Patient refused  Other Topics Concern  . Not on file  Social History Narrative  . Not on file  Additional Social History:      Sleep: Fair  Appetite:  Fair  Current Medications: Current Facility-Administered Medications  Medication Dose Route Frequency Provider Last Rate Last Dose  . acetaminophen (TYLENOL) tablet 650 mg  650 mg Oral Q6H PRN Money, Lowry Ram, FNP   650 mg at 09/02/18 6948  . alum & mag hydroxide-simeth (MAALOX/MYLANTA) 200-200-20 MG/5ML suspension 30 mL  30 mL Oral Q4H PRN Money, Lowry Ram, FNP      . atorvastatin (LIPITOR) tablet 10 mg  10 mg Oral q1800 Money, Lowry Ram, FNP   10 mg at 09/02/18 1756  . carvedilol (COREG) tablet 3.125 mg  3.125 mg Oral BID WC  Sharma Covert, MD   3.125 mg at 09/03/18 0752  . DULoxetine (CYMBALTA) DR capsule 60 mg  60 mg Oral Daily Clapacs, Madie Reno, MD   60 mg at 09/03/18 0752  . ferrous sulfate tablet 325 mg  325 mg Oral BID WC Money, Lowry Ram, FNP   325 mg at 09/03/18 0752  . gabapentin (NEURONTIN) capsule 900 mg  900 mg Oral TID Clapacs, Madie Reno, MD   900 mg at 09/03/18 1151  . guanFACINE (INTUNIV) ER tablet 1 mg  1 mg Oral Daily Clapacs, John T, MD   1 mg at 09/03/18 0752  . hydrOXYzine (ATARAX/VISTARIL) tablet 50 mg  50 mg Oral Q6H PRN Clapacs, Madie Reno, MD   50 mg at 09/02/18 0736  . insulin aspart (novoLOG) injection 0-5 Units  0-5 Units Subcutaneous QHS Money, Lowry Ram, FNP   Stopped at 08/31/18 2144  . insulin aspart (novoLOG) injection 0-9 Units  0-9 Units Subcutaneous TID WC Money, Lowry Ram, FNP   2 Units at 09/03/18 1151  . insulin glargine (LANTUS) injection 15 Units  15 Units Subcutaneous QHS Sharma Covert, MD   15 Units at 09/02/18 2128  . lamoTRIgine (LAMICTAL) tablet 175 mg  175 mg Oral QHS Sharma Covert, MD   175 mg at 09/02/18 2125  . levothyroxine (SYNTHROID) tablet 150 mcg  150 mcg Oral Q0600 Money, Lowry Ram, FNP   150 mcg at 09/03/18 0636  . loratadine (CLARITIN) tablet 10 mg  10 mg Oral Daily Clapacs, Madie Reno, MD   10 mg at 09/03/18 0752  . LORazepam (ATIVAN) tablet 2 mg  2 mg Oral Q4H PRN Clapacs, Madie Reno, MD   2 mg at 09/02/18 1612   Or  . LORazepam (ATIVAN) injection 2 mg  2 mg Intramuscular Q4H PRN Clapacs, Madie Reno, MD   2 mg at 09/01/18 0741  . lurasidone (LATUDA) tablet 80 mg  80 mg Oral BID WC Sharma Covert, MD   80 mg at 09/03/18 0752  . magnesium hydroxide (MILK OF MAGNESIA) suspension 30 mL  30 mL Oral Daily PRN Money, Darnelle Maffucci B, FNP      . metFORMIN (GLUCOPHAGE) tablet 1,000 mg  1,000 mg Oral BID WC Sharma Covert, MD   1,000 mg at 09/03/18 0752  . moxifloxacin (VIGAMOX) 0.5 % ophthalmic solution 1 drop  1 drop Right Eye TID Money, Lowry Ram, FNP   1 drop at 09/03/18 1152   . neomycin-bacitracin-polymyxin (NEOSPORIN) ointment   Topical BID Sharma Covert, MD      . pantoprazole (PROTONIX) EC tablet 40 mg  40 mg Oral Daily Money, Lowry Ram, FNP   40 mg at 09/03/18 5462  . traZODone (DESYREL) tablet 100 mg  100 mg Oral QHS Money, Lowry Ram, FNP   100 mg  at 09/02/18 2125  . ziprasidone (GEODON) injection 20 mg  20 mg Intramuscular Q12H PRN Clapacs, Madie Reno, MD   20 mg at 09/01/18 0741    Lab Results:  Results for orders placed or performed during the hospital encounter of 08/04/18 (from the past 48 hour(s))  Glucose, capillary     Status: Abnormal   Collection Time: 09/01/18  4:12 PM  Result Value Ref Range   Glucose-Capillary 122 (H) 70 - 99 mg/dL   Comment 1 Notify RN   Glucose, capillary     Status: Abnormal   Collection Time: 09/01/18  9:24 PM  Result Value Ref Range   Glucose-Capillary 131 (H) 70 - 99 mg/dL  Glucose, capillary     Status: Abnormal   Collection Time: 09/02/18  6:57 AM  Result Value Ref Range   Glucose-Capillary 244 (H) 70 - 99 mg/dL   Comment 1 Notify RN   Glucose, capillary     Status: None   Collection Time: 09/02/18 12:18 PM  Result Value Ref Range   Glucose-Capillary 79 70 - 99 mg/dL  Glucose, capillary     Status: Abnormal   Collection Time: 09/02/18  4:07 PM  Result Value Ref Range   Glucose-Capillary 133 (H) 70 - 99 mg/dL   Comment 1 Notify RN   Glucose, capillary     Status: Abnormal   Collection Time: 09/02/18  8:15 PM  Result Value Ref Range   Glucose-Capillary 132 (H) 70 - 99 mg/dL   Comment 1 Notify RN   Glucose, capillary     Status: Abnormal   Collection Time: 09/03/18  7:04 AM  Result Value Ref Range   Glucose-Capillary 134 (H) 70 - 99 mg/dL   Comment 1 Notify RN   Glucose, capillary     Status: Abnormal   Collection Time: 09/03/18 11:15 AM  Result Value Ref Range   Glucose-Capillary 158 (H) 70 - 99 mg/dL    Blood Alcohol level:  Lab Results  Component Value Date   ETH <10 07/19/2018   ETH <10  08/67/6195    Metabolic Disorder Labs: Lab Results  Component Value Date   HGBA1C 5.3 06/05/2018   MPG 105.41 06/05/2018   MPG 102.54 12/30/2017   No results found for: PROLACTIN Lab Results  Component Value Date   CHOL 155 06/05/2018   TRIG 93 06/05/2018   HDL 54 06/05/2018   CHOLHDL 2.9 06/05/2018   VLDL 19 06/05/2018   LDLCALC 82 06/05/2018   LDLCALC 90 12/30/2017    Physical Findings: AIMS:  , ,  ,  ,    CIWA:    COWS:     Musculoskeletal: Strength & Muscle Tone: within normal limits Gait & Station: normal Patient leans: N/A  Psychiatric Specialty Exam: Physical Exam   ROS   Blood pressure 131/65, pulse 70, temperature 98 F (36.7 C), temperature source Oral, resp. rate 18, height 4\' 10"  (1.473 m), weight 72.6 kg, SpO2 97 %.Body mass index is 33.44 kg/m.  General Appearance: casual, better groomed than yesterday  Eye Contact:  Good  Speech:  Less pressured than yesterday  Volume:  Normal  Mood:  Euthymic  Affect:  Appropriate, calm  Thought Process:  Coherent, better organized  Orientation:  Full (Time, Place, and Person)  Thought Content:  Illogical somewhat, flight of ideas  Suicidal Thoughts:  No  Homicidal Thoughts:  No  Memory:  Immediate;   Fair Recent;   Fair Remote;   Fair  Judgement:  Poor  Insight:  Shallow  Psychomotor Activity:  Increased  Concentration:  Concentration: Fair and Attention Span: Poor  Recall:  [employer] of Knowledge:  Fair  Language:  Fair  Akathisia:  No  Handed:  Right  AIMS (if indicated):     Assets:  Desire for Improvement Physical Health  ADL's:  Intact  Cognition:  Impaired,  Mild  Sleep:  Number of Hours: 7.5     Treatment Plan Summary: Daily contact with patient to assess and evaluate symptoms and progress in treatment   53yo F with psych h/o moderate intellectual disability, personality disorder, Schizoaffective disorder, who was admitted to [facility] due to agitation at group home. Patient is calm, visible  and appropriate in a milieu. She is compliant with medications and her behavior seem to be appropriate and stable. No med changes made today.  Impression:  IDD, moderate. Borderline personality disorder Schizoaffective disorder   Plan: -continue inpatient psych admission; 15-minute checks; daily contact with patient to assess and evaluate symptoms and progress in treatment; psychoeducation.   -continue scheduled psych medications: Latuda 80mg  PO daily for mood stabilization; Cymbalta 60mg  PO daily for depression, anxiety; Lamictal 175mg  po QHS for mood stabilization; Trazodone 100mg  PO QHS for sleep; Gabapentin 900mg  PO TID for pain, anxiety, sleep.   -continue PRN medications.   -Disposition: SW is working on placement. Patient has a guardian.  Larita Fife, MD 09/03/2018, 1:41 PM

## 2018-09-03 NOTE — Plan of Care (Signed)
  Problem: Education: Goal: Knowledge of [place] [facility] Education information/materials will improve Outcome: Progressing   Problem: Coping: Goal: Ability to verbalize frustrations and anger appropriately will improve Outcome: Progressing Goal: Ability to demonstrate self-control will improve Outcome: Progressing   Problem: Safety: Goal: Periods of time without injury will increase Outcome: Progressing   Problem: Safety: Goal: Ability to redirect hostility and anger into socially appropriate behaviors will improve Outcome: Progressing   Problem: Activity: Goal: Interest or engagement in leisure activities will improve Outcome: Progressing Goal: Imbalance in normal sleep/wake cycle will improve Outcome: Progressing

## 2018-09-03 NOTE — Progress Notes (Signed)
7-11 Continuous  1:1 7:45 am  Patient in dayroom  Sitting at table 0800  Patient in dayroom  Sitting at table 0900 Patient remains in dayroom  Sitting 1000   Patient Patient in dayroom   1100 Sitting at table in dayroom

## 2018-09-03 NOTE — Progress Notes (Signed)
12-3 1:1 continuous 12:00   Patient in dayroom  Sitting at table 1300 Patient in dayroom  Sitting at table 1400  Patient in group  1500  Outside  Sitting  On bench

## 2018-09-03 NOTE — Plan of Care (Signed)
Continue to have anger outburst   unable to identify  ownership. Limited interaction with peers , difficulties processing  information Compliant  with  taking  her medication .   Problem: Education: Goal: Knowledge of [place] [facility] Education information/materials will improve Outcome: Not Progressing   Problem: Safety: Goal: Periods of time without injury will increase Outcome: Not Progressing   Problem: Safety: Goal: Ability to redirect hostility and anger into socially appropriate behaviors will improve Outcome: Not Progressing   Problem: Activity: Goal: Interest or engagement in leisure activities will improve Outcome: Not Progressing Goal: Imbalance in normal sleep/wake cycle will improve Outcome: Not Progressing

## 2018-09-04 LAB — GLUCOSE, CAPILLARY
Glucose-Capillary: 179 mg/dL — ABNORMAL HIGH (ref 70–99)
Glucose-Capillary: 92 mg/dL (ref 70–99)
Glucose-Capillary: 106 mg/dL — ABNORMAL HIGH (ref 70–99)

## 2018-09-04 NOTE — Progress Notes (Signed)
1:1 Patient Hourly Rounding  1000: Patient is in the dayroom, with other members on the unit, and her assigned safety sitter present.  1400: Patient is in the bathroom, with her assigned safety sitter present at the bedside.   1800: Patient is at the nurse's station stating to this writer that she wants to stay in her room, with her assigned safety sitter present at bedside.

## 2018-09-04 NOTE — Plan of Care (Signed)
  Problem: Education: Goal: Knowledge of [place] [facility] Education information/materials will improve Outcome: Progressing   Problem: Coping: Goal: Ability to verbalize frustrations and anger appropriately will improve Outcome: Progressing Goal: Ability to demonstrate self-control will improve Outcome: Progressing   Problem: Safety: Goal: Periods of time without injury will increase Outcome: Progressing   Problem: Safety: Goal: Ability to redirect hostility and anger into socially appropriate behaviors will improve Outcome: Progressing   Problem: Activity: Goal: Interest or engagement in leisure activities will improve Outcome: Progressing Goal: Imbalance in normal sleep/wake cycle will improve Outcome: Progressing

## 2018-09-04 NOTE — Plan of Care (Signed)
D- Patient alert and oriented. Patient presents in a silly/pleasant mood on assessment reporting that she slept "good last night and had no complaints to voice to this writer at this time. Patient did question when she would be able to come off of 1:1 and this writer explained to her that staff would continue to monitor her behavior and revisit this decision tomorrow. Patient denies SI, HI, AVH, and pain at this time. Patient also denies any signs/symptoms of depression/anxiety stating "I've been good for the past couple of days and will be from now on". Patient's goal for today is "going to group, seeing my doctor, see my social worker", and patient also reports that going outside is on her list of things to do.  A- Scheduled medications administered to patient, per MD orders. Support and encouragement provided.  Routine safety checks conducted every 15 minutes.  Patient informed to notify staff with problems or concerns.  R- No adverse drug reactions noted. Patient contracts for safety at this time. Patient compliant with medications and treatment plan. Patient receptive, calm, and cooperative. Patient interacts well with others on the unit.  Patient remains safe at this time.  Problem: Education: Goal: Knowledge of [place] [facility] Education information/materials will improve Outcome: Progressing   Problem: Coping: Goal: Ability to verbalize frustrations and anger appropriately will improve Outcome: Progressing Goal: Ability to demonstrate self-control will improve Outcome: Progressing   Problem: Safety: Goal: Periods of time without injury will increase Outcome: Progressing   Problem: Safety: Goal: Ability to redirect hostility and anger into socially appropriate behaviors will improve Outcome: Progressing   Problem: Activity: Goal: Interest or engagement in leisure activities will improve Outcome: Progressing Goal: Imbalance in normal sleep/wake cycle will improve Outcome:  Progressing

## 2018-09-04 NOTE — [facility] Group Notes (Signed)
LCSW Group Therapy Note 09/04/2018 1:15pm  Type of Therapy and Topic: Group Therapy: Feelings Around Returning Home & Establishing a Supportive Framework and Supporting Oneself When Supports Not Available  Participation Level: Active  Description of Group:  Patients first processed thoughts and feelings about upcoming discharge. These included fears of upcoming changes, lack of change, new living environments, judgements and expectations from others and overall stigma of mental health issues. The group then discussed the definition of a supportive framework, what that looks and feels like, and how do to discern it from an unhealthy non-supportive network. The group identified different types of supports as well as what to do when your family/friends are less than helpful or unavailable  Therapeutic Goals  1. Patient will identify one healthy supportive network that they can use at discharge. 2. Patient will identify one factor of a supportive framework and how to tell it from an unhealthy network. 3. Patient able to identify one coping skill to use when they do not have positive supports from others. 4. Patient will demonstrate ability to communicate their needs through discussion and/or role plays.  Summary of Patient Progress:  The patient reported she feels "better." Pt engaged during group session. As patients processed their anxiety about discharge and described healthy supports patient shared she is ready to be discharge. She stated, "I got my anger under control." Patients identified at least one self-care tool they were willing to use after discharge.   Therapeutic Modalities Cognitive Behavioral Therapy Motivational Interviewing   Cheree Ditto, LCSW 09/04/2018 12:37 PM

## 2018-09-04 NOTE — Progress Notes (Signed)
Patient is continued on 1&1 to protect  from aggression towards staff and other patients in the unit, patient is constantly  redirected to safety , education and support is provided, medication is given and no noticeable side effects, patient is stable no distress.

## 2018-09-04 NOTE — Progress Notes (Signed)
[facility] MD Progress Note  09/04/2018 11:01 AM Jessica [name]  MRN:  [id number]   Jessica [name] is a 53yo F with psych h/o moderate intellectual disability, personality disorder, Schizoaffective disorder, who was admitted to [facility] a month ago due to agitation at group home.   Patient seen.  Chart reviewed. Patient discussed with nursing; no unsafe overnight events reported.  Subjective:   Patient is calm, cooperative. She states "I am doing good". She says she focused on positive thoughts and hopes the SW can find a placement for her next week.  She reports "good" mood, denies any thoughts of harming self or others. No sx of psychosis, mania. She denies having any side effects from medications.    Principal Problem: Schizoaffective disorder, bipolar type ([place]) Diagnosis: Principal Problem:   Schizoaffective disorder, bipolar type ([place]) Active Problems:   Diabetes mellitus without complication ([place])   Intellectual disability   Agitation   Borderline personality disorder (HCC)   HTN (hypertension)   Hypothyroidism   Bipolar disorder, unspecified ([place])  Total Time spent with patient:15 min  Past Psychiatric History: see H&P  Past Medical History:  Past Medical History:  Diagnosis Date  . Anxiety   . Depression   . Diabetes mellitus without complication ([place])   . GERD (gastroesophageal reflux disease)   . Homicidal ideation   . Hypertension   . Hypothyroidism     Past Surgical History:  Procedure Laterality Date  . COLPOSCOPY    . FRACTURE SURGERY    . TRACHEOSTOMY     Family History: History reviewed. No pertinent family history. Family Psychiatric  History: see H&P Social History:  Social History   Substance and Sexual Activity  Alcohol Use No  . Frequency: Never     Social History   Substance and Sexual Activity  Drug Use Yes  . Types: Marijuana, "Crack" cocaine   Comment: reports she has not done anything in a long time    Social History   Socioeconomic History  .  Marital status: Single    Spouse name: Not on file  . Number of children: Not on file  . Years of education: Not on file  . Highest education level: Not on file  Occupational History  . Not on file  Social Needs  . Financial resource strain: Somewhat hard  . Food insecurity    Worry: Patient refused    Inability: Patient refused  . Transportation needs    Medical: Patient refused    Non-medical: Patient refused  Tobacco Use  . Smoking status: Former Research scientist (life sciences)  . Smokeless tobacco: Never Used  Substance and Sexual Activity  . Alcohol use: No    Frequency: Never  . Drug use: Yes    Types: Marijuana, "Crack" cocaine    Comment: reports she has not done anything in a long time  . Sexual activity: Not Currently    Birth control/protection: Abstinence  Lifestyle  . Physical activity    Days per week: Patient refused    Minutes per session: Patient refused  . Stress: Rather much  Relationships  . Social Herbalist on phone: Patient refused    Gets together: Patient refused    Attends religious service: Patient refused    Active member of club or organization: Patient refused    Attends meetings of clubs or organizations: Patient refused    Relationship status: Patient refused  Other Topics Concern  . Not on file  Social History Narrative  . Not on  file   Additional Social History:      Sleep: Fair  Appetite:  Fair  Current Medications: Current Facility-Administered Medications  Medication Dose Route Frequency Provider Last Rate Last Dose  . acetaminophen (TYLENOL) tablet 650 mg  650 mg Oral Q6H PRN Money, Lowry Ram, FNP   650 mg at 09/02/18 7425  . alum & mag hydroxide-simeth (MAALOX/MYLANTA) 200-200-20 MG/5ML suspension 30 mL  30 mL Oral Q4H PRN Money, Lowry Ram, FNP      . atorvastatin (LIPITOR) tablet 10 mg  10 mg Oral q1800 Money, Lowry Ram, FNP   10 mg at 09/03/18 1751  . carvedilol (COREG) tablet 3.125 mg  3.125 mg Oral BID WC Sharma Covert, MD   3.125  mg at 09/04/18 0757  . DULoxetine (CYMBALTA) DR capsule 60 mg  60 mg Oral Daily Clapacs, Madie Reno, MD   60 mg at 09/04/18 0758  . ferrous sulfate tablet 325 mg  325 mg Oral BID WC Money, Lowry Ram, FNP   325 mg at 09/04/18 0758  . gabapentin (NEURONTIN) capsule 900 mg  900 mg Oral TID Clapacs, Madie Reno, MD   900 mg at 09/04/18 0757  . guanFACINE (INTUNIV) ER tablet 1 mg  1 mg Oral Daily Clapacs, Madie Reno, MD   1 mg at 09/04/18 0758  . hydrOXYzine (ATARAX/VISTARIL) tablet 50 mg  50 mg Oral Q6H PRN Clapacs, Madie Reno, MD   50 mg at 09/02/18 0736  . insulin aspart (novoLOG) injection 0-5 Units  0-5 Units Subcutaneous QHS Money, Lowry Ram, FNP   Stopped at 08/31/18 2144  . insulin aspart (novoLOG) injection 0-9 Units  0-9 Units Subcutaneous TID WC Money, Lowry Ram, FNP   2 Units at 09/04/18 0758  . insulin glargine (LANTUS) injection 15 Units  15 Units Subcutaneous QHS Sharma Covert, MD   15 Units at 09/03/18 2139  . lamoTRIgine (LAMICTAL) tablet 175 mg  175 mg Oral QHS Sharma Covert, MD   175 mg at 09/03/18 2138  . levothyroxine (SYNTHROID) tablet 150 mcg  150 mcg Oral Q0600 Money, Lowry Ram, FNP   150 mcg at 09/04/18 9563  . loratadine (CLARITIN) tablet 10 mg  10 mg Oral Daily Clapacs, Madie Reno, MD   10 mg at 09/04/18 0758  . LORazepam (ATIVAN) tablet 2 mg  2 mg Oral Q4H PRN Clapacs, Madie Reno, MD   2 mg at 09/02/18 1612   Or  . LORazepam (ATIVAN) injection 2 mg  2 mg Intramuscular Q4H PRN Clapacs, Madie Reno, MD   2 mg at 09/01/18 0741  . lurasidone (LATUDA) tablet 80 mg  80 mg Oral BID WC Sharma Covert, MD   80 mg at 09/04/18 0758  . magnesium hydroxide (MILK OF MAGNESIA) suspension 30 mL  30 mL Oral Daily PRN Money, Darnelle Maffucci B, FNP      . metFORMIN (GLUCOPHAGE) tablet 1,000 mg  1,000 mg Oral BID WC Sharma Covert, MD   1,000 mg at 09/04/18 0757  . moxifloxacin (VIGAMOX) 0.5 % ophthalmic solution 1 drop  1 drop Right Eye TID Money, Lowry Ram, FNP   1 drop at 09/04/18 0757  .  neomycin-bacitracin-polymyxin (NEOSPORIN) ointment   Topical BID Sharma Covert, MD      . pantoprazole (PROTONIX) EC tablet 40 mg  40 mg Oral Daily Money, Lowry Ram, FNP   40 mg at 09/04/18 0757  . traZODone (DESYREL) tablet 100 mg  100 mg Oral QHS Money, Lowry Ram, FNP  100 mg at 09/03/18 2138  . ziprasidone (GEODON) injection 20 mg  20 mg Intramuscular Q12H PRN Clapacs, Madie Reno, MD   20 mg at 09/01/18 0741    Lab Results:  Results for orders placed or performed during the hospital encounter of 08/04/18 (from the past 48 hour(s))  Glucose, capillary     Status: None   Collection Time: 09/02/18 12:18 PM  Result Value Ref Range   Glucose-Capillary 79 70 - 99 mg/dL  Glucose, capillary     Status: Abnormal   Collection Time: 09/02/18  4:07 PM  Result Value Ref Range   Glucose-Capillary 133 (H) 70 - 99 mg/dL   Comment 1 Notify RN   Glucose, capillary     Status: Abnormal   Collection Time: 09/02/18  8:15 PM  Result Value Ref Range   Glucose-Capillary 132 (H) 70 - 99 mg/dL   Comment 1 Notify RN   Glucose, capillary     Status: Abnormal   Collection Time: 09/03/18  7:04 AM  Result Value Ref Range   Glucose-Capillary 134 (H) 70 - 99 mg/dL   Comment 1 Notify RN   Glucose, capillary     Status: Abnormal   Collection Time: 09/03/18 11:15 AM  Result Value Ref Range   Glucose-Capillary 158 (H) 70 - 99 mg/dL  Glucose, capillary     Status: Abnormal   Collection Time: 09/03/18  4:17 PM  Result Value Ref Range   Glucose-Capillary 114 (H) 70 - 99 mg/dL  Glucose, capillary     Status: Abnormal   Collection Time: 09/04/18  6:58 AM  Result Value Ref Range   Glucose-Capillary 179 (H) 70 - 99 mg/dL   Comment 1 Notify RN     Blood Alcohol level:  Lab Results  Component Value Date   ETH <10 07/19/2018   ETH <10 11/15/2534    Metabolic Disorder Labs: Lab Results  Component Value Date   HGBA1C 5.3 06/05/2018   MPG 105.41 06/05/2018   MPG 102.54 12/30/2017   No results found for:  PROLACTIN Lab Results  Component Value Date   CHOL 155 06/05/2018   TRIG 93 06/05/2018   HDL 54 06/05/2018   CHOLHDL 2.9 06/05/2018   VLDL 19 06/05/2018   LDLCALC 82 06/05/2018   [place] 90 12/30/2017    Physical Findings: AIMS:  , ,  ,  ,    CIWA:    COWS:     Musculoskeletal: Strength & Muscle Tone: within normal limits Gait & Station: normal Patient leans: N/A  Psychiatric Specialty Exam: Physical Exam   ROS   Blood pressure (!) 131/91, pulse 77, temperature 97.7 F (36.5 C), temperature source Oral, resp. rate 18, height 4\' 10"  (1.473 m), weight 72.6 kg, SpO2 96 %.Body mass index is 33.44 kg/m.  General Appearance: casual, better groomed than yesterday  Eye Contact:  Good  Speech:  Less pressured than yesterday  Volume:  Normal  Mood:  Euthymic  Affect:  Appropriate, calm  Thought Process:  Coherent, better organized  Orientation:  Full (Time, Place, and Person)  Thought Content:  Illogical somewhat, flight of ideas  Suicidal Thoughts:  No  Homicidal Thoughts:  No  Memory:  Immediate;   Fair Recent;   Fair Remote;   Fair  Judgement:  Poor  Insight:  Shallow  Psychomotor Activity:  Increased  Concentration:  Concentration: Fair and Attention Span: Poor  Recall:  [employer] of Knowledge:  Fair  Language:  Fair  Akathisia:  No  Handed:  Right  AIMS (if indicated):     Assets:  Desire for Improvement Physical Health  ADL's:  Intact  Cognition:  Impaired,  Mild  Sleep:  Number of Hours: 6.5     Treatment Plan Summary: Daily contact with patient to assess and evaluate symptoms and progress in treatment   53yo F with psych h/o moderate intellectual disability, personality disorder, Schizoaffective disorder, who was admitted to [facility] due to agitation at group home. Patient is calm, visible and appropriate in a milieu. She is compliant with medications and her behavior seem to be appropriate and stable. No med changes made today.  Impression:  IDD,  moderate. Borderline personality disorder Schizoaffective disorder   Plan: -continue inpatient psych admission; 15-minute checks; daily contact with patient to assess and evaluate symptoms and progress in treatment; psychoeducation.   -continue scheduled psych medications: Latuda 80mg  PO daily for mood stabilization; Cymbalta 60mg  PO daily for depression, anxiety; Lamictal 175mg  po QHS for mood stabilization; Trazodone 100mg  PO QHS for sleep; Gabapentin 900mg  PO TID for pain, anxiety, sleep.   -continue PRN medications.   -Disposition: SW is working on placement. Patient has a guardian.  Larita Fife, MD 09/04/2018, 11:01 AM

## 2018-09-05 LAB — GLUCOSE, CAPILLARY
Glucose-Capillary: 109 mg/dL — ABNORMAL HIGH (ref 70–99)
Glucose-Capillary: 147 mg/dL — ABNORMAL HIGH (ref 70–99)
Glucose-Capillary: 123 mg/dL — ABNORMAL HIGH (ref 70–99)
Glucose-Capillary: 101 mg/dL — ABNORMAL HIGH (ref 70–99)
Glucose-Capillary: 100 mg/dL — ABNORMAL HIGH (ref 70–99)
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
## 2018-09-05 NOTE — [facility] Group Notes (Signed)
LCSW Group Therapy Note   09/05/2018 11:08 AM   Type of Therapy and Topic:  Group Therapy:  Overcoming Obstacles   Participation Level:  Active   Description of Group:    In this group patients will be encouraged to explore what they see as obstacles to their own wellness and recovery. They will be guided to discuss their thoughts, feelings, and behaviors related to these obstacles. The group will process together ways to cope with barriers, with attention given to specific choices patients can make. Each patient will be challenged to identify changes they are motivated to make in order to overcome their obstacles. This group will be process-oriented, with patients participating in exploration of their own experiences as well as giving and receiving support and challenge from other group members.   Therapeutic Goals: 1. Patient will identify personal and current obstacles as they relate to admission. 2. Patient will identify barriers that currently interfere with their wellness or overcoming obstacles.  3. Patient will identify feelings, thought process and behaviors related to these barriers. 4. Patient will identify two changes they are willing to make to overcome these obstacles:      Summary of Patient Progress Pt was appropriate and respectful in group. Pt was able to identify her current obstacle as her anger and people pushing her buttons. Pt reported that a barrier to overcoming her obstacles is having tension in her body. Pt then stepped out to use the bathroom and came back at the end of the group.     Therapeutic Modalities:   Cognitive Behavioral Therapy Solution Focused Therapy Motivational Interviewing Relapse Prevention Therapy  Evalina Field, MSW, LCSW Clinical Social Work 09/05/2018 11:08 AM

## 2018-09-05 NOTE — Plan of Care (Signed)
D- Patient alert and oriented. Patient presents in a pleasant mood on assessment stating that she slept good last night and had no complaints or concerns to voice to this Probation officer. Patient denies any signs/symptoms of depression/anxiety stating "nope, all good". Patient also denies SI, HI, AVH, and pain at this time. Patient's goal for today is "my anger", in which she will "think positive" in order to accomplish her goal.  A- Scheduled medications administered to patient, per MD orders. Support and encouragement provided.  Routine safety checks conducted every 15 minutes.  Patient informed to notify staff with problems or concerns.  R- No adverse drug reactions noted. Patient contracts for safety at this time. Patient compliant with medications and treatment plan. Patient receptive, calm, and cooperative. Patient interacts well with others on the unit.  Patient remains safe at this time.  Problem: Education: Goal: Knowledge of [place] [facility] Education information/materials will improve Outcome: Progressing   Problem: Coping: Goal: Ability to verbalize frustrations and anger appropriately will improve Outcome: Progressing Goal: Ability to demonstrate self-control will improve Outcome: Progressing   Problem: Safety: Goal: Periods of time without injury will increase Outcome: Progressing   Problem: Safety: Goal: Ability to redirect hostility and anger into socially appropriate behaviors will improve Outcome: Progressing   Problem: Activity: Goal: Interest or engagement in leisure activities will improve Outcome: Progressing Goal: Imbalance in normal sleep/wake cycle will improve Outcome: Progressing

## 2018-09-05 NOTE — Plan of Care (Signed)
  Problem: Coping: Goal: Ability to verbalize frustrations and anger appropriately will improve Outcome: Progressing Goal: Ability to demonstrate self-control will improve Outcome: Progressing  D: Patient has been calm and cooperative. Denies SI, HI and AVH. Interacting appropriately with patients and staff. No abusive language or threatening behavior. Patient is labile and unpredictable. Staff within reach. A: Continue to monitor 1:1 for safety R: Safety maintained.

## 2018-09-05 NOTE — Progress Notes (Signed)
1:1 Patient Hourly Rounding  1000: Patient is outside the nurse's station with her assigned safety sitter present at her side.  1400: Patient is in the dayroom with other members on the unit and her assigned safety sitter present at her side.  1800: Patient is at the nurse's station asking about belongings, with her assigned safety sitter present at her side.

## 2018-09-05 NOTE — Progress Notes (Signed)
1:1 observation 1900-2300 D: Patient has been calm and cooperative. Denies SI, HI and AVH. No threatening behavior. No abusive language. Mood is labile. Behavior is unpredictable. Affect is flat. Medication compliant. Staff within reach. A: continue 1:1 for safety R: Safety maintained. 2300-0400 D: Patient has been resting in bed with eyes closed. Staff within reach A: Continue 1:1 for safety.  R: Safety maintained. 0400-0700 D: Patient awake. Cooperative and compliant with vital signs and CBG. No abusive or threatening behavior observed. Staff within reach. Denies SI, HI and AVH.  A: Continue 1:1 for safety. R: Safety maintained.

## 2018-09-05 NOTE — Progress Notes (Signed)
CSw returned phone calls for the following group homes:  [facility] [phone] - Has two beds available, but only for [employer] W[facility].  [place] [phone] - lvm  [facility] [phone] - mailbox full, CSW will try again later.  Evalina Field, MSW, LCSW Clinical Social Work 09/05/2018 9:56 AM

## 2018-09-05 NOTE — Progress Notes (Signed)
[facility] MD Progress Note  09/05/2018 3:39 PM Jessica [name]  MRN:  [id number] Subjective: Follow-up for patient with schizoaffective disorder and developmental disability.  By the time I met with her today the patient was calm and was able to discuss coping skills.  She was pleased that she had received a package from her sister in the mail.  First thing in the morning however she threw a little bit of a tantrum when she talked with the social workers.  Being specifically told that there was no progress on a living situation is what always throws her into a Tizzy  No new medical complaints. Principal Problem: Schizoaffective disorder, bipolar type ([place]) Diagnosis: Principal Problem:   Schizoaffective disorder, bipolar type ([place]) Active Problems:   Diabetes mellitus without complication ([place])   Intellectual disability   Agitation   Borderline personality disorder ([facility])   HTN (hypertension)   Hypothyroidism   Bipolar disorder, unspecified ([place])  Total Time spent with patient: 20 minutes  Past Psychiatric History: Long history of behavior problems from her illness  Past Medical History:  Past Medical History:  Diagnosis Date  . Anxiety   . Depression   . Diabetes mellitus without complication ([place])   . GERD (gastroesophageal reflux disease)   . Homicidal ideation   . Hypertension   . Hypothyroidism     Past Surgical History:  Procedure Laterality Date  . COLPOSCOPY    . FRACTURE SURGERY    . TRACHEOSTOMY     Family History: History reviewed. No pertinent family history. Family Psychiatric  History: See previous Social History:  Social History   Substance and Sexual Activity  Alcohol Use No  . Frequency: Never     Social History   Substance and Sexual Activity  Drug Use Yes  . Types: Marijuana, "Crack" cocaine   Comment: reports she has not done anything in a long time    Social History   Socioeconomic History  . Marital status: Single    Spouse name: Not on file  .  Number of children: Not on file  . Years of education: Not on file  . Highest education level: Not on file  Occupational History  . Not on file  Social Needs  . Financial resource strain: Somewhat hard  . Food insecurity    Worry: Patient refused    Inability: Patient refused  . Transportation needs    Medical: Patient refused    Non-medical: Patient refused  Tobacco Use  . Smoking status: Former Research scientist (life sciences)  . Smokeless tobacco: Never Used  Substance and Sexual Activity  . Alcohol use: No    Frequency: Never  . Drug use: Yes    Types: Marijuana, "Crack" cocaine    Comment: reports she has not done anything in a long time  . Sexual activity: Not Currently    Birth control/protection: Abstinence  Lifestyle  . Physical activity    Days per week: Patient refused    Minutes per session: Patient refused  . Stress: Rather much  Relationships  . Social Herbalist on phone: Patient refused    Gets together: Patient refused    Attends religious service: Patient refused    Active member of club or organization: Patient refused    Attends meetings of clubs or organizations: Patient refused    Relationship status: Patient refused  Other Topics Concern  . Not on file  Social History Narrative  . Not on file   Additional Social History:  Sleep: Fair  Appetite:  Good  Current Medications: Current Facility-Administered Medications  Medication Dose Route Frequency Provider Last Rate Last Dose  . acetaminophen (TYLENOL) tablet 650 mg  650 mg Oral Q6H PRN Money, Lowry Ram, FNP   650 mg at 09/02/18 6387  . alum & mag hydroxide-simeth (MAALOX/MYLANTA) 200-200-20 MG/5ML suspension 30 mL  30 mL Oral Q4H PRN Money, Lowry Ram, FNP      . atorvastatin (LIPITOR) tablet 10 mg  10 mg Oral q1800 Money, Lowry Ram, FNP   10 mg at 09/04/18 1721  . carvedilol (COREG) tablet 3.125 mg  3.125 mg Oral BID WC Sharma Covert, MD   3.125 mg at 09/05/18 0827  .  DULoxetine (CYMBALTA) DR capsule 60 mg  60 mg Oral Daily Quashon Jesus, Madie Reno, MD   60 mg at 09/05/18 0827  . ferrous sulfate tablet 325 mg  325 mg Oral BID WC Money, Lowry Ram, FNP   325 mg at 09/05/18 0827  . gabapentin (NEURONTIN) capsule 900 mg  900 mg Oral TID Jveon Pound, Madie Reno, MD   900 mg at 09/05/18 1220  . guanFACINE (INTUNIV) ER tablet 1 mg  1 mg Oral Daily Lalana Wachter T, MD   1 mg at 09/05/18 0827  . hydrOXYzine (ATARAX/VISTARIL) tablet 50 mg  50 mg Oral Q6H PRN Aniceto Kyser, Madie Reno, MD   50 mg at 09/02/18 0736  . insulin aspart (novoLOG) injection 0-5 Units  0-5 Units Subcutaneous QHS Money, Lowry Ram, FNP   Stopped at 08/31/18 2144  . insulin aspart (novoLOG) injection 0-9 Units  0-9 Units Subcutaneous TID WC Money, Lowry Ram, FNP   1 Units at 09/05/18 1221  . insulin glargine (LANTUS) injection 15 Units  15 Units Subcutaneous QHS Sharma Covert, MD   15 Units at 09/04/18 2111  . lamoTRIgine (LAMICTAL) tablet 175 mg  175 mg Oral QHS Sharma Covert, MD   175 mg at 09/04/18 2111  . levothyroxine (SYNTHROID) tablet 150 mcg  150 mcg Oral Q0600 Money, Lowry Ram, FNP   150 mcg at 09/05/18 5643  . loratadine (CLARITIN) tablet 10 mg  10 mg Oral Daily Libia Fazzini, Madie Reno, MD   10 mg at 09/05/18 0827  . LORazepam (ATIVAN) tablet 2 mg  2 mg Oral Q4H PRN Karolee Meloni, Madie Reno, MD   2 mg at 09/02/18 1612   Or  . LORazepam (ATIVAN) injection 2 mg  2 mg Intramuscular Q4H PRN Silvina Hackleman, Madie Reno, MD   2 mg at 09/01/18 0741  . lurasidone (LATUDA) tablet 80 mg  80 mg Oral BID WC Sharma Covert, MD   80 mg at 09/05/18 0827  . magnesium hydroxide (MILK OF MAGNESIA) suspension 30 mL  30 mL Oral Daily PRN Money, Darnelle Maffucci B, FNP      . metFORMIN (GLUCOPHAGE) tablet 1,000 mg  1,000 mg Oral BID WC Sharma Covert, MD   1,000 mg at 09/05/18 0827  . moxifloxacin (VIGAMOX) 0.5 % ophthalmic solution 1 drop  1 drop Right Eye TID Money, Lowry Ram, FNP   1 drop at 09/05/18 1220  . neomycin-bacitracin-polymyxin (NEOSPORIN) ointment    Topical BID Sharma Covert, MD      . pantoprazole (PROTONIX) EC tablet 40 mg  40 mg Oral Daily Money, Lowry Ram, FNP   40 mg at 09/05/18 0827  . traZODone (DESYREL) tablet 100 mg  100 mg Oral QHS Money, Travis B, FNP   100 mg at 09/04/18 2111  . ziprasidone (GEODON) injection  20 mg  20 mg Intramuscular Q12H PRN Satvik Parco, Madie Reno, MD   20 mg at 09/05/18 1029    Lab Results:  Results for orders placed or performed during the hospital encounter of 08/04/18 (from the past 48 hour(s))  Glucose, capillary     Status: Abnormal   Collection Time: 09/03/18  4:17 PM  Result Value Ref Range   Glucose-Capillary 114 (H) 70 - 99 mg/dL  Glucose, capillary     Status: Abnormal   Collection Time: 09/04/18  6:58 AM  Result Value Ref Range   Glucose-Capillary 179 (H) 70 - 99 mg/dL   Comment 1 Notify RN   Glucose, capillary     Status: None   Collection Time: 09/04/18 11:21 AM  Result Value Ref Range   Glucose-Capillary 92 70 - 99 mg/dL  Glucose, capillary     Status: Abnormal   Collection Time: 09/04/18  4:24 PM  Result Value Ref Range   Glucose-Capillary 106 (H) 70 - 99 mg/dL  Glucose, capillary     Status: Abnormal   Collection Time: 09/04/18  8:55 PM  Result Value Ref Range   Glucose-Capillary 109 (H) 70 - 99 mg/dL  Glucose, capillary     Status: Abnormal   Collection Time: 09/05/18  6:24 AM  Result Value Ref Range   Glucose-Capillary 147 (H) 70 - 99 mg/dL  Glucose, capillary     Status: Abnormal   Collection Time: 09/05/18 11:22 AM  Result Value Ref Range   Glucose-Capillary 123 (H) 70 - 99 mg/dL    Blood Alcohol level:  Lab Results  Component Value Date   ETH <10 07/19/2018   ETH <10 97/58/8325    Metabolic Disorder Labs: Lab Results  Component Value Date   HGBA1C 5.3 06/05/2018   MPG 105.41 06/05/2018   MPG 102.54 12/30/2017   No results found for: PROLACTIN Lab Results  Component Value Date   CHOL 155 06/05/2018   TRIG 93 06/05/2018   HDL 54 06/05/2018   CHOLHDL 2.9  06/05/2018   VLDL 19 06/05/2018   LDLCALC 82 06/05/2018   [place] 90 12/30/2017    Physical Findings: AIMS:  , ,  ,  ,    CIWA:    COWS:     Musculoskeletal: Strength & Muscle Tone: within normal limits Gait & Station: normal Patient leans: N/A  Psychiatric Specialty Exam: Physical Exam  Nursing note and vitals reviewed. Constitutional: She appears well-developed and well-nourished.  HENT:  Head: Normocephalic and atraumatic.  Eyes: Pupils are equal, round, and reactive to light. Conjunctivae are normal.  Neck: Normal range of motion.  Cardiovascular: Regular rhythm and normal heart sounds.  Respiratory: Effort normal.  GI: Soft.  Musculoskeletal: Normal range of motion.  Neurological: She is alert.  Skin: Skin is warm and dry.  Psychiatric: Her affect is blunt. Her speech is delayed. She is slowed. Cognition and memory are impaired. She expresses impulsivity. She expresses no homicidal and no suicidal ideation.    Review of Systems  Constitutional: Negative.   HENT: Negative.   Eyes: Negative.   Respiratory: Negative.   Cardiovascular: Negative.   Gastrointestinal: Negative.   Musculoskeletal: Negative.   Skin: Negative.   Neurological: Negative.   Psychiatric/Behavioral: Negative.     Blood pressure 120/71, pulse 75, temperature 98.3 F (36.8 C), temperature source Oral, resp. rate 18, height 4' 10"  (1.473 m), weight 72.6 kg, SpO2 100 %.Body mass index is 33.44 kg/m.  General Appearance: Casual  Eye Contact:  Good  Speech:  Normal Rate  Volume:  Normal  Mood:  Euthymic  Affect:  Constricted  Thought Process:  Goal Directed  Orientation:  Full (Time, Place, and Person)  Thought Content:  Logical  Suicidal Thoughts:  No  Homicidal Thoughts:  No  Memory:  Immediate;   Fair Recent;   Fair Remote;   Fair  Judgement:  Fair  Insight:  Fair  Psychomotor Activity:  Decreased  Concentration:  Concentration: Fair  Recall:  [employer] of Knowledge:  Fair   Language:  Fair  Akathisia:  No  Handed:  Right  AIMS (if indicated):     Assets:  Desire for Improvement Physical Health  ADL's:  Intact  Cognition:  Impaired,  Moderate  Sleep:  Number of Hours: 6.75     Treatment Plan Summary: Plan No change to medication.  Supportive counseling and encouragement.  We are still mainly looking for discharge planning.  Alethia Berthold, MD 09/05/2018, 3:39 PM

## 2018-09-05 NOTE — Progress Notes (Signed)
Patient is down in her room escalating, hollering at her assigned safety sitter stating "I don't want you in my room". This Probation officer heard a loud noise coming from the hall, patient has thrown out her bedside table up against the wall in the hallway. This writer went down to talk to patient and she stated " I don't have to talk to you". This Probation officer asked patient what happened to make her start acting this way because she has been fine all weekend and the first part of the morning. Patient stated "I'm not telling you anything, I don't want to talk to you, and you get out of my room, bitch". This Probation officer stated to patient that this type of language was not going to be tolerated and she stated "I don't give a shit, nigger". This writer tried to calm patient down by stating that she was just asking to come off 1:1 yesterday and patent hollered out "I don't care, none of ya'll care about me, I don't care what I said yesterday, I didn't mean it". Patient proceeds to call this writer a black bitch multiple times and then calls one of the security staff a white bitch. This Probation officer stated to patient that this behavior is not acceptable and that she needs to calm down. Patient received her PRN Geodon to help calm her down and she willingly took the medication although she was still upset. Patient's bedside table and laundry baskets have been removed from her room.

## 2018-09-05 NOTE — [facility] Counselor (Signed)
CSW spoke with Harrington Challenger at [facility] who confirms that patient remains on the waitlist.  Assunta Curtis, MSW, LCSW 09/05/2018 3:33 PM

## 2018-09-05 NOTE — Progress Notes (Signed)
Recreation Therapy Notes   Date: 09/05/2018  Time: 9:30 am  Location: Craft room  Behavioral response: Appropriate   Intervention Topic: Time Management  Discussion/Intervention:  Group content today was focused on time management. The group defined time management and identified healthy ways to manage time. Individuals expressed how much of the 24 hours they use in a day. Patients expressed how much time they use just for themselves personally. The group expressed how they have managed their time in the past. Individuals participated in the intervention "Managing Life" where they had a chance to see how much of the 24 hours they use and where it goes. Clinical Observations/Feedback:  Patient came to group late due to unknown reasons. She stated that in order to manage time you must have a plan. Participant left group early due to unknown reasons and never returned to group.  Shuntavia Yerby LRT/CTRS          Giovanie Lefebre 09/05/2018 11:10 AM

## 2018-09-06 LAB — GLUCOSE, CAPILLARY
Glucose-Capillary: 184 mg/dL — ABNORMAL HIGH (ref 70–99)
Glucose-Capillary: 159 mg/dL — ABNORMAL HIGH (ref 70–99)
Glucose-Capillary: 137 mg/dL — ABNORMAL HIGH (ref 70–99)
Glucose-Capillary: 108 mg/dL — ABNORMAL HIGH (ref 70–99)

## 2018-09-06 MED ORDER — GUANFACINE HCL ER 1 MG PO TB24
2.0000 mg | ORAL_TABLET | Freq: Every day | ORAL | Status: DC
  Administered 2018-09-07 – 2018-09-19 (×13): 2 mg via ORAL
  Filled 2018-09-06 (×13): qty 2

## 2018-09-06 NOTE — Progress Notes (Signed)
CSW spoke with [facility] [phone] regarding potential placement for pt. They reported they do have beds available [female and female] and reported CSW would need to speak with Santiago Glad at [phone] ext. [phone]. CSW left a vm for Santiago Glad.  Evalina Field, MSW, LCSW Clinical Social Work 09/06/2018 1:50 PM

## 2018-09-06 NOTE — Progress Notes (Signed)
[facility] MD Progress Note  09/06/2018 2:46 PM Jessica [name]  MRN:  [id number] Subjective: Follow-up for this patient with schizoaffective disorder and developmental disability.  Patient seen and chart reviewed.  Case reviewed with multiple members of the unit staff.  During the interview today the patient was calm and had no new complaints.  She tells me that she is trying hard to work on controlling her temper.  She had no new physical complaints and appeared to be tolerating medicines well.  Patient still has episodes which may occur at any time of day during which she will become emotionally explosive and labile.  Principal Problem: Schizoaffective disorder, bipolar type ([place]) Diagnosis: Principal Problem:   Schizoaffective disorder, bipolar type ([place]) Active Problems:   Diabetes mellitus without complication ([place])   Intellectual disability   Agitation   Borderline personality disorder (HCC)   HTN (hypertension)   Hypothyroidism   Bipolar disorder, unspecified ([place])  Total Time spent with patient: 20 minutes  Past Psychiatric History: Longstanding history of behavior problems with some improvement on medication  Past Medical History:  Past Medical History:  Diagnosis Date  . Anxiety   . Depression   . Diabetes mellitus without complication ([place])   . GERD (gastroesophageal reflux disease)   . Homicidal ideation   . Hypertension   . Hypothyroidism     Past Surgical History:  Procedure Laterality Date  . COLPOSCOPY    . FRACTURE SURGERY    . TRACHEOSTOMY     Family History: History reviewed. No pertinent family history. Family Psychiatric  History: See previous Social History:  Social History   Substance and Sexual Activity  Alcohol Use No  . Frequency: Never     Social History   Substance and Sexual Activity  Drug Use Yes  . Types: Marijuana, "Crack" cocaine   Comment: reports she has not done anything in a long time    Social History   Socioeconomic History  .  Marital status: Single    Spouse name: Not on file  . Number of children: Not on file  . Years of education: Not on file  . Highest education level: Not on file  Occupational History  . Not on file  Social Needs  . Financial resource strain: Somewhat hard  . Food insecurity    Worry: Patient refused    Inability: Patient refused  . Transportation needs    Medical: Patient refused    Non-medical: Patient refused  Tobacco Use  . Smoking status: Former Research scientist (life sciences)  . Smokeless tobacco: Never Used  Substance and Sexual Activity  . Alcohol use: No    Frequency: Never  . Drug use: Yes    Types: Marijuana, "Crack" cocaine    Comment: reports she has not done anything in a long time  . Sexual activity: Not Currently    Birth control/protection: Abstinence  Lifestyle  . Physical activity    Days per week: Patient refused    Minutes per session: Patient refused  . Stress: Rather much  Relationships  . Social Herbalist on phone: Patient refused    Gets together: Patient refused    Attends religious service: Patient refused    Active member of club or organization: Patient refused    Attends meetings of clubs or organizations: Patient refused    Relationship status: Patient refused  Other Topics Concern  . Not on file  Social History Narrative  . Not on file   Additional Social History:  Sleep: Fair  Appetite:  Fair  Current Medications: Current Facility-Administered Medications  Medication Dose Route Frequency Provider Last Rate Last Dose  . acetaminophen (TYLENOL) tablet 650 mg  650 mg Oral Q6H PRN Money, Lowry Ram, FNP   650 mg at 09/02/18 1610  . alum & mag hydroxide-simeth (MAALOX/MYLANTA) 200-200-20 MG/5ML suspension 30 mL  30 mL Oral Q4H PRN Money, Lowry Ram, FNP      . atorvastatin (LIPITOR) tablet 10 mg  10 mg Oral q1800 Money, Lowry Ram, FNP   10 mg at 09/05/18 1724  . carvedilol (COREG) tablet 3.125 mg  3.125 mg Oral BID WC  Sharma Covert, MD   3.125 mg at 09/06/18 0814  . DULoxetine (CYMBALTA) DR capsule 60 mg  60 mg Oral Daily Tiffancy Moger, Madie Reno, MD   60 mg at 09/06/18 0814  . ferrous sulfate tablet 325 mg  325 mg Oral BID WC Money, Lowry Ram, FNP   325 mg at 09/06/18 9604  . gabapentin (NEURONTIN) capsule 900 mg  900 mg Oral TID Anastacio Bua T, MD   900 mg at 09/06/18 1150  . [START ON 09/07/2018] guanFACINE (INTUNIV) ER tablet 2 mg  2 mg Oral Daily Huyen Perazzo T, MD      . hydrOXYzine (ATARAX/VISTARIL) tablet 50 mg  50 mg Oral Q6H PRN Mitsuru Dault, Madie Reno, MD   50 mg at 09/02/18 0736  . insulin aspart (novoLOG) injection 0-5 Units  0-5 Units Subcutaneous QHS Money, Lowry Ram, FNP   Stopped at 08/31/18 2144  . insulin aspart (novoLOG) injection 0-9 Units  0-9 Units Subcutaneous TID WC Money, Lowry Ram, FNP   2 Units at 09/06/18 1150  . insulin glargine (LANTUS) injection 15 Units  15 Units Subcutaneous QHS Sharma Covert, MD   15 Units at 09/05/18 2129  . lamoTRIgine (LAMICTAL) tablet 175 mg  175 mg Oral QHS Sharma Covert, MD   175 mg at 09/05/18 2127  . levothyroxine (SYNTHROID) tablet 150 mcg  150 mcg Oral Q0600 Money, Lowry Ram, FNP   150 mcg at 09/06/18 5409  . loratadine (CLARITIN) tablet 10 mg  10 mg Oral Daily Dynastee Brummell, Madie Reno, MD   10 mg at 09/06/18 0814  . LORazepam (ATIVAN) tablet 2 mg  2 mg Oral Q4H PRN Jeanine Caven, Madie Reno, MD   2 mg at 09/02/18 1612   Or  . LORazepam (ATIVAN) injection 2 mg  2 mg Intramuscular Q4H PRN [place] Lehnen, Madie Reno, MD   2 mg at 09/01/18 0741  . lurasidone (LATUDA) tablet 80 mg  80 mg Oral BID WC Sharma Covert, MD   80 mg at 09/06/18 0814  . magnesium hydroxide (MILK OF MAGNESIA) suspension 30 mL  30 mL Oral Daily PRN Money, Darnelle Maffucci B, FNP      . metFORMIN (GLUCOPHAGE) tablet 1,000 mg  1,000 mg Oral BID WC Sharma Covert, MD   1,000 mg at 09/06/18 0814  . moxifloxacin (VIGAMOX) 0.5 % ophthalmic solution 1 drop  1 drop Right Eye TID Money, Lowry Ram, FNP   1 drop at 09/06/18 1150   . neomycin-bacitracin-polymyxin (NEOSPORIN) ointment   Topical BID Sharma Covert, MD      . pantoprazole (PROTONIX) EC tablet 40 mg  40 mg Oral Daily Money, Lowry Ram, FNP   40 mg at 09/06/18 8119  . traZODone (DESYREL) tablet 100 mg  100 mg Oral QHS Money, Travis B, FNP   100 mg at 09/05/18 2128  . ziprasidone (GEODON) injection  20 mg  20 mg Intramuscular Q12H PRN Gladie Gravette, Madie Reno, MD   20 mg at 09/05/18 1029    Lab Results:  Results for orders placed or performed during the hospital encounter of 08/04/18 (from the past 48 hour(s))  Glucose, capillary     Status: Abnormal   Collection Time: 09/04/18  4:24 PM  Result Value Ref Range   Glucose-Capillary 106 (H) 70 - 99 mg/dL  Glucose, capillary     Status: Abnormal   Collection Time: 09/04/18  8:55 PM  Result Value Ref Range   Glucose-Capillary 109 (H) 70 - 99 mg/dL  Glucose, capillary     Status: Abnormal   Collection Time: 09/05/18  6:24 AM  Result Value Ref Range   Glucose-Capillary 147 (H) 70 - 99 mg/dL  Glucose, capillary     Status: Abnormal   Collection Time: 09/05/18 11:22 AM  Result Value Ref Range   Glucose-Capillary 123 (H) 70 - 99 mg/dL  Glucose, capillary     Status: Abnormal   Collection Time: 09/05/18  4:24 PM  Result Value Ref Range   Glucose-Capillary 101 (H) 70 - 99 mg/dL   Comment 1 Notify RN   Glucose, capillary     Status: Abnormal   Collection Time: 09/05/18  9:03 PM  Result Value Ref Range   Glucose-Capillary 100 (H) 70 - 99 mg/dL   Comment 1 Notify RN   Glucose, capillary     Status: Abnormal   Collection Time: 09/06/18  7:12 AM  Result Value Ref Range   Glucose-Capillary 184 (H) 70 - 99 mg/dL   Comment 1 Notify RN   Glucose, capillary     Status: Abnormal   Collection Time: 09/06/18 11:28 AM  Result Value Ref Range   Glucose-Capillary 159 (H) 70 - 99 mg/dL    Blood Alcohol level:  Lab Results  Component Value Date   ETH <10 07/19/2018   ETH <10 30/16/0109    Metabolic Disorder  Labs: Lab Results  Component Value Date   HGBA1C 5.3 06/05/2018   MPG 105.41 06/05/2018   MPG 102.54 12/30/2017   No results found for: PROLACTIN Lab Results  Component Value Date   CHOL 155 06/05/2018   TRIG 93 06/05/2018   HDL 54 06/05/2018   CHOLHDL 2.9 06/05/2018   VLDL 19 06/05/2018   LDLCALC 82 06/05/2018   [place] 90 12/30/2017    Physical Findings: AIMS:  , ,  ,  ,    CIWA:    COWS:     Musculoskeletal: Strength & Muscle Tone: within normal limits Gait & Station: normal Patient leans: N/A  Psychiatric Specialty Exam: Physical Exam  Nursing note and vitals reviewed. Constitutional: She appears well-developed and well-nourished.  HENT:  Head: Normocephalic and atraumatic.  Eyes: Pupils are equal, round, and reactive to light. Conjunctivae are normal.  Neck: Normal range of motion.  Cardiovascular: Regular rhythm and normal heart sounds.  Respiratory: Effort normal.  GI: Soft.  Musculoskeletal: Normal range of motion.  Neurological: She is alert.  Skin: Skin is warm and dry.  Psychiatric: She has a normal mood and affect. Her behavior is normal. Judgment and thought content normal.    Review of Systems  Constitutional: Negative.   HENT: Negative.   Eyes: Negative.   Respiratory: Negative.   Cardiovascular: Negative.   Gastrointestinal: Negative.   Musculoskeletal: Negative.   Skin: Negative.   Neurological: Negative.   Psychiatric/Behavioral: Negative.     Blood pressure 126/73, pulse 94, temperature 98.2 F (  36.8 C), temperature source Oral, resp. rate 16, height 4\' 10"  (1.473 m), weight 72.6 kg, SpO2 93 %.Body mass index is 33.44 kg/m.  General Appearance: Casual  Eye Contact:  Fair  Speech:  Slow  Volume:  Decreased  Mood:  Dysphoric  Affect:  Constricted  Thought Process:  Coherent  Orientation:  Full (Time, Place, and Person)  Thought Content:  Logical  Suicidal Thoughts:  No  Homicidal Thoughts:  No  Memory:  Immediate;   Fair Recent;    Fair Remote;   Fair  Judgement:  Impaired  Insight:  Shallow  Psychomotor Activity:  Decreased  Concentration:  Concentration: Poor  Recall:  [employer] of Knowledge:  Fair  Language:  Fair  Akathisia:  No  Handed:  Right  AIMS (if indicated):     Assets:  Desire for Improvement  ADL's:  Intact  Cognition:  Impaired,  Mild and Moderate  Sleep:  Number of Hours: 7.15     Treatment Plan Summary: Daily contact with patient to assess and evaluate symptoms and progress in treatment, Medication management and Plan She might possibly have shown some benefit from the guanfacine or it may just be a coincidence.  She seems to tolerate it without any complaint.  I will try raising the dose to 2 mg.  No other change to any medicine.  Encouragement to the patient.  Alethia Berthold, MD 09/06/2018, 2:46 PM

## 2018-09-06 NOTE — Plan of Care (Signed)
D- Patient alert and oriented. Patient presents in a pleasant mood on assessment reporting that she slept "good" last night and had no major complaints to voice to this Probation officer. Patient also stated that "I'm going to have a better day, I have to you know". Patient reports no signs/symptoms of depression/anxiety. Patient also denies SI, HI, AVH, and pain at this time. Patient's goal for today is to "go to group and see my doctor". Patient is also still concerned with "getting a group home".  A- Scheduled medications administered to patient, per MD orders. Support and encouragement provided.  Routine safety checks conducted every 15 minutes.  Patient informed to notify staff with problems or concerns.  R- No adverse drug reactions noted. Patient contracts for safety at this time. Patient compliant with medications and treatment plan. Patient receptive, calm, and cooperative. Patient interacts well with others on the unit.  Patient remains safe at this time.  Problem: Education: Goal: Knowledge of [place] [facility] Education information/materials will improve Outcome: Progressing   Problem: Coping: Goal: Ability to verbalize frustrations and anger appropriately will improve Outcome: Progressing Goal: Ability to demonstrate self-control will improve Outcome: Progressing   Problem: Safety: Goal: Periods of time without injury will increase Outcome: Progressing   Problem: Safety: Goal: Ability to redirect hostility and anger into socially appropriate behaviors will improve Outcome: Progressing   Problem: Activity: Goal: Interest or engagement in leisure activities will improve Outcome: Progressing Goal: Imbalance in normal sleep/wake cycle will improve Outcome: Progressing

## 2018-09-06 NOTE — Plan of Care (Signed)
Patient is pleasant upon approach and smiling , expressing being in a good mood today, has not requested any PRNs denies any SI/HI/AVH, comply with her medications , attends groups. Sleep is continuous , support and safety education is provided, no distress.   Problem: Education: Goal: Knowledge of [place] [facility] Education information/materials will improve Outcome: Progressing   Problem: Coping: Goal: Ability to verbalize frustrations and anger appropriately will improve Outcome: Progressing Goal: Ability to demonstrate self-control will improve Outcome: Progressing   Problem: Safety: Goal: Periods of time without injury will increase Outcome: Progressing   Problem: Safety: Goal: Ability to redirect hostility and anger into socially appropriate behaviors will improve Outcome: Progressing   Problem: Activity: Goal: Interest or engagement in leisure activities will improve Outcome: Progressing Goal: Imbalance in normal sleep/wake cycle will improve Outcome: Progressing

## 2018-09-06 NOTE — Progress Notes (Signed)
1:1 Patient Hourly Rounding  1000: Patient is in recreational therapy with her assigned safety sitter present at her side.  1400: Patient is in her room using the bathroom with her assigned safety sitter present at her bedside.  1800: Patient is in the dayroom, with other members on the unit, and her assigned safety sitter present.

## 2018-09-06 NOTE — Progress Notes (Signed)
Recreation Therapy Notes  Date: 09/06/2018  Time: 9:30 am  Location: Craft room  Behavioral response: Appropriate   Intervention Topic: Coping-Skills  Discussion/Intervention:  Group content on today was focused on coping skills. The group defined what coping skills are and when they can be used. Individuals described how they normally cope with thing and the coping skills they normally use. Patients expressed why it is important to cope with things and how not coping with things can affect you. The group participated in the intervention "Exploring coping skills" where they had a chance to test new coping skills they could use in the future.  Clinical Observations/Feedback:  Patient came to group and defined coping-skills as coping with mental illness. She explained that her coping skills are listening to music and being herself.  Individual was social with peers and staff while participating in group.  Vasilisa Vore LRT/CTRS          Elya Diloreto 09/06/2018 11:53 AM

## 2018-09-06 NOTE — [facility] Group Notes (Signed)
LCSW Group Therapy Note  09/06/2018 1:00 PM  Type of Therapy/Topic:  Group Therapy:  Feelings about Diagnosis  Participation Level:  Active   Description of Group:   This group will allow patients to explore their thoughts and feelings about diagnoses they have received. Patients will be guided to explore their level of understanding and acceptance of these diagnoses. Facilitator will encourage patients to process their thoughts and feelings about the reactions of others to their diagnosis and will guide patients in identifying ways to discuss their diagnosis with significant others in their lives. This group will be process-oriented, with patients participating in exploration of their own experiences, giving and receiving support, and processing challenge from other group members.   Therapeutic Goals: 1. Patient will demonstrate understanding of diagnosis as evidenced by identifying two or more symptoms of the disorder 2. Patient will be able to express two feelings regarding the diagnosis 3. Patient will demonstrate their ability to communicate their needs through discussion and/or role play  Summary of Patient Progress: Patient was present in group. Patient was attentive and supportive of other group members.  Patient shared how she has felt supported by her family in regards to her diagnosis. Patient shared that her family has a history of Schizophrenia diagnosis.    Therapeutic Modalities:   Cognitive Behavioral Therapy Brief Therapy Feelings Identification   Assunta Curtis, MSW, LCSW 09/06/2018 2:16 PM

## 2018-09-07 LAB — GLUCOSE, CAPILLARY
Glucose-Capillary: 151 mg/dL — ABNORMAL HIGH (ref 70–99)
Glucose-Capillary: 134 mg/dL — ABNORMAL HIGH (ref 70–99)
Glucose-Capillary: 105 mg/dL — ABNORMAL HIGH (ref 70–99)
Glucose-Capillary: 113 mg/dL — ABNORMAL HIGH (ref 70–99)

## 2018-09-07 NOTE — Progress Notes (Signed)
D: Acting out behavior   A: Patient continue to yell profanities at staff " bitch nigger "Patient came out of room and threw hot coffee at staff went back in  And slammed  the door continue to walk around in room.  Refused all oral  medications this am  Patient remains on back hall with low stimulation 1:1 present  With patient . Unable to process.   R: Staff continue to monitor patient

## 2018-09-07 NOTE — Progress Notes (Signed)
Resting in bed 1:1  Present

## 2018-09-07 NOTE — Progress Notes (Signed)
D:Acting Out Behavior    A:Patient throwing  baskets at staff this am shift and  Every thing in her room  . Continue to call staff niggers and bitches .  Unable to control  Behavior at this time .  R: Show of presents from staff patient took IM  Medication without  Any further altercation

## 2018-09-07 NOTE — Progress Notes (Signed)
Resting  In bed  Eyes closed  1:1 present

## 2018-09-07 NOTE — Progress Notes (Signed)
D: Resting in bed  Eyes closed . 1:1 present  With patient

## 2018-09-07 NOTE — B[facility] Counselor (Signed)
CSW spoke with Jessica [name] at [facility](fax # [phone]) who states she is the assigned care coordinator for the patient. She request patient current medication list and any medications she has been tried on since admission. CSW relayed this information to the physician, awaiting documentation. Ms. Jessica [name] reports she will place the patient on the group living high wait list. CSW informed by physician(J Clapacs) that he spoke with Dr. Lacinda Axon at [facility] and he says the patient will be placed on the high priority list. CSW relayed this information to legal guardian, Fayne Norrie.

## 2018-09-07 NOTE — Progress Notes (Signed)
Recreation Therapy Notes  Date: 09/07/2018   Time: 9:30 am   Location: Craft room   Behavioral response: N/A   Intervention Topic: Problem Solving   Discussion/Intervention: Patient did not attend group.   Clinical Observations/Feedback:  Patient did not attend group.   Adlynn Lowenstein LRT/CTRS        Zanylah Hardie 09/07/2018 11:12 AM

## 2018-09-07 NOTE — Progress Notes (Signed)
Patient alert and oriented x 4, affect is flat but she brightens upon approach, she denies SI/HI/AVH, she is interacting appropriately with peers and staff. took evening medication and attended evening wrap up group. . Patient's thoughts are organized, no loud outburst or aggression towards staff, 15 minutes safety checks maintained will continue to monitor

## 2018-09-07 NOTE — Progress Notes (Signed)
[facility] MD Progress Note  09/07/2018 5:14 PM Jessica [name]  MRN:  [id number] Subjective: Patient seen chart reviewed.  Patient was limited in her cooperation today.  She had an episode this morning in which she went off completely throwing hot coffee on a staff member attacking people throwing things.  Unclear of course what could have set this off.  She self to the rest of the day refusing to talk with me.  No evidence of any new physical problems. Principal Problem: Schizoaffective disorder, bipolar type ([place]) Diagnosis: Principal Problem:   Schizoaffective disorder, bipolar type ([place]) Active Problems:   Diabetes mellitus without complication ([place])   Intellectual disability   Agitation   Borderline personality disorder ([facility])   HTN (hypertension)   Hypothyroidism   Bipolar disorder, unspecified ([place])  Total Time spent with patient: 15 minutes  Past Psychiatric History: History of longstanding behavior problems with impulsive explosiveness that has so far proved resistant to treatment  Past Medical History:  Past Medical History:  Diagnosis Date  . Anxiety   . Depression   . Diabetes mellitus without complication ([place])   . GERD (gastroesophageal reflux disease)   . Homicidal ideation   . Hypertension   . Hypothyroidism     Past Surgical History:  Procedure Laterality Date  . COLPOSCOPY    . FRACTURE SURGERY    . TRACHEOSTOMY     Family History: History reviewed. No pertinent family history. Family Psychiatric  History: See previous Social History:  Social History   Substance and Sexual Activity  Alcohol Use No  . Frequency: Never     Social History   Substance and Sexual Activity  Drug Use Yes  . Types: Marijuana, "Crack" cocaine   Comment: reports she has not done anything in a long time    Social History   Socioeconomic History  . Marital status: Single    Spouse name: Not on file  . Number of children: Not on file  . Years of education: Not on file  .  Highest education level: Not on file  Occupational History  . Not on file  Social Needs  . Financial resource strain: Somewhat hard  . Food insecurity    Worry: Patient refused    Inability: Patient refused  . Transportation needs    Medical: Patient refused    Non-medical: Patient refused  Tobacco Use  . Smoking status: Former Research scientist (life sciences)  . Smokeless tobacco: Never Used  Substance and Sexual Activity  . Alcohol use: No    Frequency: Never  . Drug use: Yes    Types: Marijuana, "Crack" cocaine    Comment: reports she has not done anything in a long time  . Sexual activity: Not Currently    Birth control/protection: Abstinence  Lifestyle  . Physical activity    Days per week: Patient refused    Minutes per session: Patient refused  . Stress: Rather much  Relationships  . Social Herbalist on phone: Patient refused    Gets together: Patient refused    Attends religious service: Patient refused    Active member of club or organization: Patient refused    Attends meetings of clubs or organizations: Patient refused    Relationship status: Patient refused  Other Topics Concern  . Not on file  Social History Narrative  . Not on file   Additional Social History:  Sleep: Fair  Appetite:  Fair  Current Medications: Current Facility-Administered Medications  Medication Dose Route Frequency Provider Last Rate Last Dose  . acetaminophen (TYLENOL) tablet 650 mg  650 mg Oral Q6H PRN Money, Lowry Ram, FNP   650 mg at 09/02/18 1093  . alum & mag hydroxide-simeth (MAALOX/MYLANTA) 200-200-20 MG/5ML suspension 30 mL  30 mL Oral Q4H PRN Money, Lowry Ram, FNP      . atorvastatin (LIPITOR) tablet 10 mg  10 mg Oral q1800 Money, Lowry Ram, FNP   10 mg at 09/07/18 1713  . carvedilol (COREG) tablet 3.125 mg  3.125 mg Oral BID WC Sharma Covert, MD   3.125 mg at 09/07/18 1627  . DULoxetine (CYMBALTA) DR capsule 60 mg  60 mg Oral Daily Kalika Smay, Madie Reno,  MD   60 mg at 09/07/18 1626  . ferrous sulfate tablet 325 mg  325 mg Oral BID WC Money, Lowry Ram, FNP   325 mg at 09/07/18 1626  . gabapentin (NEURONTIN) capsule 900 mg  900 mg Oral TID Jarquis Walker, Madie Reno, MD   900 mg at 09/07/18 1628  . guanFACINE (INTUNIV) ER tablet 2 mg  2 mg Oral Daily Tiandre Teall T, MD   2 mg at 09/07/18 1628  . hydrOXYzine (ATARAX/VISTARIL) tablet 50 mg  50 mg Oral Q6H PRN Theadora Noyes, Madie Reno, MD   50 mg at 09/02/18 0736  . insulin aspart (novoLOG) injection 0-5 Units  0-5 Units Subcutaneous QHS Money, Lowry Ram, FNP   Stopped at 08/31/18 2144  . insulin aspart (novoLOG) injection 0-9 Units  0-9 Units Subcutaneous TID WC Money, Lowry Ram, FNP   1 Units at 09/06/18 1716  . insulin glargine (LANTUS) injection 15 Units  15 Units Subcutaneous QHS Sharma Covert, MD   15 Units at 09/06/18 2145  . lamoTRIgine (LAMICTAL) tablet 175 mg  175 mg Oral QHS Sharma Covert, MD   175 mg at 09/06/18 2143  . levothyroxine (SYNTHROID) tablet 150 mcg  150 mcg Oral Q0600 Money, Lowry Ram, FNP   150 mcg at 09/07/18 2355  . loratadine (CLARITIN) tablet 10 mg  10 mg Oral Daily Justyce Yeater, Madie Reno, MD   10 mg at 09/07/18 1627  . LORazepam (ATIVAN) tablet 2 mg  2 mg Oral Q4H PRN Aelyn Stanaland, Madie Reno, MD   2 mg at 09/06/18 2143   Or  . LORazepam (ATIVAN) injection 2 mg  2 mg Intramuscular Q4H PRN Shermeka Rutt, Madie Reno, MD   2 mg at 09/07/18 0732  . lurasidone (LATUDA) tablet 80 mg  80 mg Oral BID WC Sharma Covert, MD   80 mg at 09/07/18 1628  . magnesium hydroxide (MILK OF MAGNESIA) suspension 30 mL  30 mL Oral Daily PRN Money, Darnelle Maffucci B, FNP      . metFORMIN (GLUCOPHAGE) tablet 1,000 mg  1,000 mg Oral BID WC Sharma Covert, MD   1,000 mg at 09/07/18 1627  . moxifloxacin (VIGAMOX) 0.5 % ophthalmic solution 1 drop  1 drop Right Eye TID Money, Lowry Ram, FNP   1 drop at 09/07/18 1629  . neomycin-bacitracin-polymyxin (NEOSPORIN) ointment   Topical BID Sharma Covert, MD      . pantoprazole (PROTONIX) EC  tablet 40 mg  40 mg Oral Daily Money, Lowry Ram, FNP   40 mg at 09/07/18 1627  . traZODone (DESYREL) tablet 100 mg  100 mg Oral QHS Money, Travis B, FNP   100 mg at 09/06/18 2143  . ziprasidone (GEODON) injection  20 mg  20 mg Intramuscular Q12H PRN Masen Salvas, Madie Reno, MD   20 mg at 09/07/18 0732    Lab Results:  Results for orders placed or performed during the hospital encounter of 08/04/18 (from the past 48 hour(s))  Glucose, capillary     Status: Abnormal   Collection Time: 09/05/18  9:03 PM  Result Value Ref Range   Glucose-Capillary 100 (H) 70 - 99 mg/dL   Comment 1 Notify RN   Glucose, capillary     Status: Abnormal   Collection Time: 09/06/18  7:12 AM  Result Value Ref Range   Glucose-Capillary 184 (H) 70 - 99 mg/dL   Comment 1 Notify RN   Glucose, capillary     Status: Abnormal   Collection Time: 09/06/18 11:28 AM  Result Value Ref Range   Glucose-Capillary 159 (H) 70 - 99 mg/dL  Glucose, capillary     Status: Abnormal   Collection Time: 09/06/18  4:19 PM  Result Value Ref Range   Glucose-Capillary 137 (H) 70 - 99 mg/dL   Comment 1 Notify RN   Glucose, capillary     Status: Abnormal   Collection Time: 09/06/18  9:09 PM  Result Value Ref Range   Glucose-Capillary 108 (H) 70 - 99 mg/dL   Comment 1 Notify RN   Glucose, capillary     Status: Abnormal   Collection Time: 09/07/18  6:52 AM  Result Value Ref Range   Glucose-Capillary 151 (H) 70 - 99 mg/dL   Comment 1 Notify RN   Glucose, capillary     Status: Abnormal   Collection Time: 09/07/18 11:32 AM  Result Value Ref Range   Glucose-Capillary 134 (H) 70 - 99 mg/dL   Comment 1 Document in Chart   Glucose, capillary     Status: Abnormal   Collection Time: 09/07/18  4:15 PM  Result Value Ref Range   Glucose-Capillary 105 (H) 70 - 99 mg/dL   Comment 1 Notify RN     Blood Alcohol level:  Lab Results  Component Value Date   ETH <10 07/19/2018   ETH <10 26/37/8588    Metabolic Disorder Labs: Lab Results  Component  Value Date   HGBA1C 5.3 06/05/2018   MPG 105.41 06/05/2018   MPG 102.54 12/30/2017   No results found for: PROLACTIN Lab Results  Component Value Date   CHOL 155 06/05/2018   TRIG 93 06/05/2018   HDL 54 06/05/2018   CHOLHDL 2.9 06/05/2018   VLDL 19 06/05/2018   LDLCALC 82 06/05/2018   Page 90 12/30/2017    Physical Findings: AIMS:  , ,  ,  ,    CIWA:    COWS:     Musculoskeletal: Strength & Muscle Tone: within normal limits Gait & Station: normal Patient leans: N/A  Psychiatric Specialty Exam: Physical Exam  Nursing note and vitals reviewed. Constitutional: She appears well-developed and well-nourished.  HENT:  Head: Normocephalic and atraumatic.  Eyes: Pupils are equal, round, and reactive to light. Conjunctivae are normal.  Neck: Normal range of motion.  Cardiovascular: Regular rhythm and normal heart sounds.  Respiratory: Effort normal. No respiratory distress.  GI: Soft.  Musculoskeletal: Normal range of motion.  Neurological: She is alert.  Skin: Skin is warm and dry.  Psychiatric: Her affect is blunt. Her speech is tangential. She is agitated. Cognition and memory are impaired. She expresses impulsivity and inappropriate judgment. She is noncommunicative.    Review of Systems  Constitutional: Negative.   HENT: Negative.   Eyes:  Negative.   Respiratory: Negative.   Cardiovascular: Negative.   Gastrointestinal: Negative.   Musculoskeletal: Negative.   Skin: Negative.   Neurological: Negative.   Psychiatric/Behavioral: Positive for depression.    Blood pressure (!) 145/83, pulse 66, temperature 97.8 F (36.6 C), temperature source Oral, resp. rate 18, height 4\' 10"  (1.473 m), weight 72.6 kg, SpO2 100 %.Body mass index is 33.44 kg/m.  General Appearance: Disheveled  Eye Contact:  None  Speech:  Slow  Volume:  Decreased  Mood:  Dysphoric  Affect:  Constricted  Thought Process:  Disorganized  Orientation:  Full (Time, Place, and Person)  Thought  Content:  Illogical  Suicidal Thoughts:  Yes.  without intent/plan  Homicidal Thoughts:  Yes.  without intent/plan  Memory:  Immediate;   Poor Recent;   Poor Remote;   Poor  Judgement:  Impaired  Insight:  Shallow  Psychomotor Activity:  Restlessness  Concentration:  Concentration: Poor  Recall:  Poor  Fund of Knowledge:  Fair  Language:  Fair  Akathisia:  No  Handed:  Right  AIMS (if indicated):     Assets:  Resilience  ADL's:  Impaired  Cognition:  Impaired,  Moderate  Sleep:  Number of Hours: 7.15     Treatment Plan Summary: Daily contact with patient to assess and evaluate symptoms and progress in treatment, Medication management and Plan Today I spoke with Dr. Lacinda Axon the director of [facility] who agreed that our current assessment was indication for the patient to be on priority list.  I am told that she will be placed on [facility] for [facility].  Reviewed with social workers the patient's treatment.  The number of medicines that have been tried have been relatively limited but in part this is because most of her visits to the hospital or for a day or less and that this particular one was largely in the emergency room.  Medicines have never seemed to be of any help in preventing her explosive behavior and when she is not having explosions she does not necessarily have target symptoms.  Continue current medicines.  Follow-up with daily supportive therapy.  Nursing has requested that her sitter be changed to while awake only so I will make that change.  Alethia Berthold, MD 09/07/2018, 5:14 PM

## 2018-09-07 NOTE — [facility] Group Notes (Signed)
Emotional Regulation 09/07/2018 1PM  Type of Therapy/Topic:  Group Therapy:  Emotion Regulation  Participation Level:  Did Not Attend   Description of Group:   The purpose of this group is to assist patients in learning to regulate negative emotions and experience positive emotions. Patients will be guided to discuss ways in which they have been vulnerable to their negative emotions. These vulnerabilities will be juxtaposed with experiences of positive emotions or situations, and patients will be challenged to use positive emotions to combat negative ones. Special emphasis will be placed on coping with negative emotions in conflict situations, and patients will process healthy conflict resolution skills.  Therapeutic Goals: 1. Patient will identify two positive emotions or experiences to reflect on in order to balance out negative emotions 2. Patient will label two or more emotions that they find the most difficult to experience 3. Patient will demonstrate positive conflict resolution skills through discussion and/or role plays  Summary of Patient Progress:       Therapeutic Modalities:   Cognitive Behavioral Therapy Feelings Identification Dialectical Behavioral Therapy   Yvette Rack, LCSW 09/07/2018 1:54 PM

## 2018-09-07 NOTE — Plan of Care (Signed)
Continue to have anger outburst   unable to identify  ownership. Limited interaction with peers , difficulties processing  information Compliant  with  taking  her medication .   Problem: Education: Goal: Knowledge of [facility] Education information/materials will improve Outcome: Not Progressing   Problem: Coping: Goal: Ability to verbalize frustrations and anger appropriately will improve Outcome: Not Progressing Goal: Ability to demonstrate self-control will improve Outcome: Not Progressing   Problem: Safety: Goal: Periods of time without injury will increase Outcome: Not Progressing   Problem: Safety: Goal: Ability to redirect hostility and anger into socially appropriate behaviors will improve Outcome: Not Progressing   Problem: Activity: Goal: Interest or engagement in leisure activities will improve Outcome: Not Progressing Goal: Imbalance in normal sleep/wake cycle will improve Outcome: Not Progressing

## 2018-09-08 LAB — GLUCOSE, CAPILLARY
Glucose-Capillary: 309 mg/dL — ABNORMAL HIGH (ref 70–99)
Glucose-Capillary: 143 mg/dL — ABNORMAL HIGH (ref 70–99)
Glucose-Capillary: 124 mg/dL — ABNORMAL HIGH (ref 70–99)
Glucose-Capillary: 105 mg/dL — ABNORMAL HIGH (ref 70–99)

## 2018-09-08 NOTE — Progress Notes (Signed)
D: Pt is in the dinning area eating breakfast. Pt is calm and cooperative.   A: Pt remains on 1:1 with sitter at side.  R: 1:1 continues, pt calm and cooperative, will continue to monitor.

## 2018-09-08 NOTE — Progress Notes (Signed)
Patient is in her room and monitored to keep from physical  aggression to staff and patient, patient has been   Inflicting physical ham to staff and making threats to do more, therefore has been placed on 1&1 for her safety and that of other residents , no distress.

## 2018-09-08 NOTE — Progress Notes (Signed)
D - Patient was in the dayroom upon arrival to the unit. Patient was pleasant during assessment. Patient denies SI/HI/AVH, pain, anxiety and depression with this Probation officer. Patient had no outbursts on the unit this evening.   A - Patient compliant with medication administration per MD orders and procedures on the unit. Patient given education. Patient given support and encouragement to be active in her treatment plan. Patient informed to let staff know if there are any issues or problems on the unit.   R - Patient being monitored Q 15 minutes for safety per unit protocol. Patient remains safe on the unit.

## 2018-09-08 NOTE — Plan of Care (Signed)
Patient didn't have any outbursts on the unit this evening.   Problem: Coping: Goal: Ability to demonstrate self-control will improve Outcome: Progressing

## 2018-09-08 NOTE — [facility] Group Notes (Signed)
LCSW Group Therapy Note  09/08/2018 11:15 AM  Type of Therapy/Topic:  Group Therapy:  Balance in Life  Participation Level:  Active  Description of Group:    This group will address the concept of balance and how it feels and looks when one is unbalanced. Patients will be encouraged to process areas in their lives that are out of balance and identify reasons for remaining unbalanced. Facilitators will guide patients in utilizing problem-solving interventions to address and correct the stressor making their life unbalanced. Understanding and applying boundaries will be explored and addressed for obtaining and maintaining a balanced life. Patients will be encouraged to explore ways to assertively make their unbalanced needs known to significant others in their lives, using other group members and facilitator for support and feedback.  Therapeutic Goals: 1. Patient will identify two or more emotions or situations they have that consume much of in their lives. 2. Patient will identify signs/triggers that life has become out of balance:  3. Patient will identify two ways to set boundaries in order to achieve balance in their lives:  4. Patient will demonstrate ability to communicate their needs through discussion and/or role plays  Summary of Patient Progress: Pt was appropriate and respectful in group. Pt was able to identify losing weight as something she wants to do for her physical health. Pt discussed her life vision being getting her own place and having a 2 bedroom place and turning one of the rooms into her art studio. Pt was respectful in group to other group members and was very engaged throughout the entire group.     Therapeutic Modalities:   Cognitive Behavioral Therapy Solution-Focused Therapy Assertiveness Training  Evalina Field, MSW, LCSW Clinical Social Work 09/08/2018 11:15 AM

## 2018-09-08 NOTE — Progress Notes (Signed)
D: Pt alert and oriented.Pt affect/mood appears as anxious.Pt verbally reports sleep last night as being poor and as having a good appetite. Pt denies experiencing any pain, HI, or AH at this time.   Pt reports have SI this morning to strangle herself, however contracts for safety and will let staff know if anything changes. Pt endorses VH of seeing snakes in her bed and room, however no longer sees them. As the day progressed this pt became preoccupied with her brother coming to visit.   Pt has been cooperative and redirectable.  A: Scheduled medications administered to pt, per MD orders. Support and encouragement provided. Frequent verbal contact made. Routine safety checks conducted q15 minutes.   R: No adverse drug reactions noted. Pt verbally contracts for safety at this time. Pt complaint with medications and treatment plan. Pt interacts well with others on the unit. Pt remains safe at this time. Will continue to monitor.
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
## 2018-09-08 NOTE — Progress Notes (Signed)
D: Pt is in the dinning area eating lunch. Pt is calm and cooperative.   A: Pt remains on 1:1 with sitter at side.  R: 1:1 continues, pt calm and cooperative, will continue to monitor.

## 2018-09-08 NOTE — Progress Notes (Signed)
Patient slept through the night with out any incidents only requiring  Routine 15 minutes and frequent safety checks for safety no distress. Marland Kitchen

## 2018-09-08 NOTE — Progress Notes (Signed)
Recreation Therapy Notes  Date: 09/08/2018  Time: 9:30 am  Location: Outside  Behavioral response: Appropriate   Intervention Topic: Relaxation   Discussion/Intervention:  Group content today was focused on relaxation. The group defined relaxation and identified healthy ways to relax. Individuals expressed how much time they spend relaxing. Patients expressed how much their life would be if they did not make time for themselves to relax. The group stated ways they could improve their relaxation techniques in the future.  Individuals participated in the intervention "Time to Relax" where they had a chance to experience different relaxation techniques.  Clinical Observations/Feedback:  Patient came to group and explained that relaxation keep her mind at ease. She stated she spends an hour a day relaxing. Individual was social with peers and staff while participating in the intervention.  Rontavious Albright LRT/CTRS         Jaslynn Thome 09/08/2018 11:39 AM

## 2018-09-08 NOTE — [facility] Counselor (Signed)
CSW spoke with the [place] at [place] confirmed that the patient was on the priority list.  She reports that she can not tell where the patient is at on the list, however, the list is "slow moving but not lengthy".    Assunta Curtis, MSW, LCSW 09/08/2018 12:13 PM

## 2018-09-09 LAB — GLUCOSE, CAPILLARY
Glucose-Capillary: 180 mg/dL — ABNORMAL HIGH (ref 70–99)
Glucose-Capillary: 143 mg/dL — ABNORMAL HIGH (ref 70–99)
Glucose-Capillary: 134 mg/dL — ABNORMAL HIGH (ref 70–99)
Glucose-Capillary: 111 mg/dL — ABNORMAL HIGH (ref 70–99)

## 2018-09-09 NOTE — Progress Notes (Signed)
D - Patient was in the dayroom upon arrival to the unit. Patient was pleasant during assessment. Patient denies SI/HI/AVH, pain, anxiety and depression with this writer. Patient had no outbursts on the unit this evening.   A - Patient compliant with medication administration per MD orders and procedures on the unit. Patient given education. Patient given support and encouragement to be active in her treatment plan. Patient informed to let staff know if there are any issues or problems on the unit.   R - Patient being monitored Q 15 minutes for safety per unit protocol. Patient remains safe on the unit.  

## 2018-09-09 NOTE — Progress Notes (Signed)
[facility] MD Progress Note  09/09/2018 4:33 PM Jessica [name]  MRN:  [id number] Subjective: Follow-up for this patient with schizoaffective disorder and developmental disability.  Patient had a good day today.  She did not have an explosive episode today.  I spoke with her a couple of times and gave her a lot of validation which she appreciates.  She is awake and interactive with others but has not been agitated.  No new medical complaints.  Tolerating medicine well. Principal Problem: Schizoaffective disorder, bipolar type ([place]) Diagnosis: Principal Problem:   Schizoaffective disorder, bipolar type ([place]) Active Problems:   Diabetes mellitus without complication ([place])   Intellectual disability   Agitation   Borderline personality disorder (HCC)   HTN (hypertension)   Hypothyroidism   Bipolar disorder, unspecified ([place])  Total Time spent with patient: 20 minutes  Past Psychiatric History: History of repeated episodes of explosiveness  Past Medical History:  Past Medical History:  Diagnosis Date  . Anxiety   . Depression   . Diabetes mellitus without complication ([place])   . GERD (gastroesophageal reflux disease)   . Homicidal ideation   . Hypertension   . Hypothyroidism     Past Surgical History:  Procedure Laterality Date  . COLPOSCOPY    . FRACTURE SURGERY    . TRACHEOSTOMY     Family History: History reviewed. No pertinent family history. Family Psychiatric  History: See previous Social History:  Social History   Substance and Sexual Activity  Alcohol Use No  . Frequency: Never     Social History   Substance and Sexual Activity  Drug Use Yes  . Types: Marijuana, "Crack" cocaine   Comment: reports she has not done anything in a long time    Social History   Socioeconomic History  . Marital status: Single    Spouse name: Not on file  . Number of children: Not on file  . Years of education: Not on file  . Highest education level: Not on file  Occupational History   . Not on file  Social Needs  . Financial resource strain: Somewhat hard  . Food insecurity    Worry: Patient refused    Inability: Patient refused  . Transportation needs    Medical: Patient refused    Non-medical: Patient refused  Tobacco Use  . Smoking status: Former Research scientist (life sciences)  . Smokeless tobacco: Never Used  Substance and Sexual Activity  . Alcohol use: No    Frequency: Never  . Drug use: Yes    Types: Marijuana, "Crack" cocaine    Comment: reports she has not done anything in a long time  . Sexual activity: Not Currently    Birth control/protection: Abstinence  Lifestyle  . Physical activity    Days per week: Patient refused    Minutes per session: Patient refused  . Stress: Rather much  Relationships  . Social Herbalist on phone: Patient refused    Gets together: Patient refused    Attends religious service: Patient refused    Active member of club or organization: Patient refused    Attends meetings of clubs or organizations: Patient refused    Relationship status: Patient refused  Other Topics Concern  . Not on file  Social History Narrative  . Not on file   Additional Social History:                         Sleep: Fair  Appetite:  Fair  Current Medications: Current Facility-Administered Medications  Medication Dose Route Frequency Provider Last Rate Last Dose  . acetaminophen (TYLENOL) tablet 650 mg  650 mg Oral Q6H PRN Money, Lowry Ram, FNP   650 mg at 09/02/18 [id number]  . alum & mag hydroxide-simeth (MAALOX/MYLANTA) 200-200-20 MG/5ML suspension 30 mL  30 mL Oral Q4H PRN Money, Lowry Ram, FNP      . atorvastatin (LIPITOR) tablet 10 mg  10 mg Oral q1800 Money, Lowry Ram, FNP   10 mg at 09/08/18 1818  . carvedilol (COREG) tablet 3.125 mg  3.125 mg Oral BID WC Sharma Covert, MD   3.125 mg at 09/09/18 [id number]  . DULoxetine (CYMBALTA) DR capsule 60 mg  60 mg Oral Daily Anureet Bruington, Madie Reno, MD   60 mg at 09/09/18 0824  . ferrous sulfate tablet 325  mg  325 mg Oral BID WC Money, Lowry Ram, FNP   325 mg at 09/09/18 [id number]  . gabapentin (NEURONTIN) capsule 900 mg  900 mg Oral TID Vishaal Strollo, Madie Reno, MD   900 mg at 09/09/18 1212  . guanFACINE (INTUNIV) ER tablet 2 mg  2 mg Oral Daily Pierrette Scheu T, MD   2 mg at 09/09/18 [id number]  . hydrOXYzine (ATARAX/VISTARIL) tablet 50 mg  50 mg Oral Q6H PRN Bill Mcvey, Madie Reno, MD   50 mg at 09/02/18 0736  . insulin aspart (novoLOG) injection 0-5 Units  0-5 Units Subcutaneous QHS Money, Lowry Ram, FNP   Stopped at 08/31/18 2144  . insulin aspart (novoLOG) injection 0-9 Units  0-9 Units Subcutaneous TID WC Money, Lowry Ram, FNP   1 Units at 09/09/18 1212  . insulin glargine (LANTUS) injection 15 Units  15 Units Subcutaneous QHS Sharma Covert, MD   15 Units at 09/08/18 2125  . lamoTRIgine (LAMICTAL) tablet 175 mg  175 mg Oral QHS Sharma Covert, MD   175 mg at 09/08/18 2125  . levothyroxine (SYNTHROID) tablet 150 mcg  150 mcg Oral Q0600 Money, Lowry Ram, FNP   150 mcg at 09/09/18 [id number]  . loratadine (CLARITIN) tablet 10 mg  10 mg Oral Daily Fadumo Heng, Madie Reno, MD   10 mg at 09/09/18 [id number]  . LORazepam (ATIVAN) tablet 2 mg  2 mg Oral Q4H PRN Tilak Oakley, Madie Reno, MD   2 mg at 09/06/18 2143   Or  . LORazepam (ATIVAN) injection 2 mg  2 mg Intramuscular Q4H PRN Monda Chastain, Madie Reno, MD   2 mg at 09/07/18 0732  . lurasidone (LATUDA) tablet 80 mg  80 mg Oral BID WC Sharma Covert, MD   80 mg at 09/09/18 0829  . magnesium hydroxide (MILK OF MAGNESIA) suspension 30 mL  30 mL Oral Daily PRN Money, Darnelle Maffucci B, FNP      . metFORMIN (GLUCOPHAGE) tablet 1,000 mg  1,000 mg Oral BID WC Sharma Covert, MD   1,000 mg at 09/09/18 0824  . moxifloxacin (VIGAMOX) 0.5 % ophthalmic solution 1 drop  1 drop Right Eye TID Money, Lowry Ram, FNP   1 drop at 09/09/18 1213  . neomycin-bacitracin-polymyxin (NEOSPORIN) ointment   Topical BID Sharma Covert, MD      . pantoprazole (PROTONIX) EC tablet 40 mg  40 mg Oral Daily Money, Lowry Ram, FNP   40 mg at  09/09/18 [id number]  . traZODone (DESYREL) tablet 100 mg  100 mg Oral QHS Money, Travis B, FNP   100 mg at 09/08/18 2125  . ziprasidone (GEODON) injection 20 mg  20 mg Intramuscular Q12H  PRN Whitley Strycharz, Madie Reno, MD   20 mg at 09/07/18 0732    Lab Results:  Results for orders placed or performed during the hospital encounter of 08/04/18 (from the past 48 hour(s))  Glucose, capillary     Status: Abnormal   Collection Time: 09/07/18  8:49 PM  Result Value Ref Range   Glucose-Capillary 113 (H) 70 - 99 mg/dL  Glucose, capillary     Status: Abnormal   Collection Time: 09/08/18  9:22 AM  Result Value Ref Range   Glucose-Capillary 309 (H) 70 - 99 mg/dL  Glucose, capillary     Status: Abnormal   Collection Time: 09/08/18 12:32 PM  Result Value Ref Range   Glucose-Capillary 143 (H) 70 - 99 mg/dL  Glucose, capillary     Status: Abnormal   Collection Time: 09/08/18  5:17 PM  Result Value Ref Range   Glucose-Capillary 124 (H) 70 - 99 mg/dL  Glucose, capillary     Status: Abnormal   Collection Time: 09/08/18  8:11 PM  Result Value Ref Range   Glucose-Capillary 105 (H) 70 - 99 mg/dL   Comment 1 Notify RN   Glucose, capillary     Status: Abnormal   Collection Time: 09/09/18  7:06 AM  Result Value Ref Range   Glucose-Capillary 180 (H) 70 - 99 mg/dL  Glucose, capillary     Status: Abnormal   Collection Time: 09/09/18 11:31 AM  Result Value Ref Range   Glucose-Capillary 143 (H) 70 - 99 mg/dL   Comment 1 Notify RN   Glucose, capillary     Status: Abnormal   Collection Time: 09/09/18  4:13 PM  Result Value Ref Range   Glucose-Capillary 134 (H) 70 - 99 mg/dL   Comment 1 Notify RN     Blood Alcohol level:  Lab Results  Component Value Date   ETH <10 07/19/2018   ETH <10 [id number]    Metabolic Disorder Labs: Lab Results  Component Value Date   HGBA1C 5.3 06/05/2018   MPG 105.41 06/05/2018   MPG 102.54 12/30/2017   No results found for: PROLACTIN Lab Results  Component Value Date   CHOL  155 06/05/2018   TRIG 93 06/05/2018   HDL 54 06/05/2018   CHOLHDL 2.9 06/05/2018   VLDL 19 06/05/2018   LDLCALC 82 06/05/2018   LDLCALC 90 12/30/2017    Physical Findings: AIMS: Facial and Oral Movements Muscles of Facial Expression: None, normal Lips and Perioral Area: None, normal Jaw: None, normal Tongue: None, normal,Extremity Movements Upper (arms, wrists, hands, fingers): None, normal Lower (legs, knees, ankles, toes): None, normal, Trunk Movements Neck, shoulders, hips: None, normal, Overall Severity Severity of abnormal movements (highest score from questions above): None, normal Incapacitation due to abnormal movements: None, normal Patient's awareness of abnormal movements (rate only patient's report): No Awareness, Dental Status Current problems with teeth and/or dentures?: Yes Does patient usually wear dentures?: No  CIWA:    COWS:     Musculoskeletal: Strength & Muscle Tone: within normal limits Gait & Station: normal Patient leans: N/A  Psychiatric Specialty Exam: Physical Exam  Nursing note and vitals reviewed. Constitutional: She appears well-developed and well-nourished.  HENT:  Head: Normocephalic and atraumatic.  Eyes: Pupils are equal, round, and reactive to light. Conjunctivae are normal.  Neck: Normal range of motion.  Cardiovascular: Regular rhythm and normal heart sounds.  Respiratory: Effort normal. No respiratory distress.  GI: Soft.  Musculoskeletal: Normal range of motion.  Neurological: She is alert.  Skin: Skin is warm  and dry.  Psychiatric: She has a normal mood and affect. Her behavior is normal. Judgment and thought content normal.    Review of Systems  Constitutional: Negative.   HENT: Negative.   Eyes: Negative.   Respiratory: Negative.   Cardiovascular: Negative.   Gastrointestinal: Negative.   Musculoskeletal: Negative.   Skin: Negative.   Neurological: Negative.   Psychiatric/Behavioral: Negative.     Blood pressure  132/68, pulse 77, temperature 98.2 F (36.8 C), temperature source Oral, resp. rate 18, height 4\' 10"  (1.473 m), weight 72.6 kg, SpO2 95 %.Body mass index is 33.44 kg/m.  General Appearance: Casual  Eye Contact:  Fair  Speech:  Clear and Coherent  Volume:  Normal  Mood:  Euthymic  Affect:  Constricted  Thought Process:  Coherent  Orientation:  Full (Time, Place, and Person)  Thought Content:  Logical  Suicidal Thoughts:  No  Homicidal Thoughts:  No  Memory:  Immediate;   Fair Recent;   Fair Remote;   Fair  Judgement:  Fair  Insight:  Fair  Psychomotor Activity:  Normal  Concentration:  Concentration: Fair  Recall:  [place] of Knowledge:  Poor  Language:  Fair  Akathisia:  No  Handed:  Right  AIMS (if indicated):     Assets:  Desire for Improvement  ADL's:  Impaired  Cognition:  Impaired,  Mild  Sleep:  Number of Hours: 6.75     Treatment Plan Summary: Daily contact with patient to assess and evaluate symptoms and progress in treatment, Medication management and Plan No change to medication.  Supportive therapy and counseling lots of encouragement.  We are still hoping that she may get into [facility] but we are not focusing on that with her rather we also are looking for a group home placement possibly.  Alethia Berthold, MD 09/09/2018, 4:33 PM

## 2018-09-09 NOTE — Progress Notes (Signed)
Recreation Therapy Notes  Date: 09/09/2018  Time: 9:30 am   Location: Craft room   Behavioral response: N/A   Intervention Topic: Leisure   Discussion/Intervention: Patient did not attend group.   Clinical Observations/Feedback:  Patient did not attend group.   Yuna Pizzolato LRT/CTRS        Bralon Antkowiak 09/09/2018 12:06 PM

## 2018-09-09 NOTE — Plan of Care (Signed)
Pt denies depression, anxiety, SI, HI and AVH. Pt was educated on care plan and verbalizes understanding. Kanai Berrios RN Problem: Education: Goal: Knowledge of [facility] Education information/materials will improve Outcome: Progressing   Problem: Coping: Goal: Ability to verbalize frustrations and anger appropriately will improve Outcome: Progressing Goal: Ability to demonstrate self-control will improve Outcome: Progressing   Problem: Safety: Goal: Periods of time without injury will increase Outcome: Progressing   Problem: Safety: Goal: Ability to redirect hostility and anger into socially appropriate behaviors will improve Outcome: Progressing   Problem: Activity: Goal: Interest or engagement in leisure activities will improve Outcome: Progressing Goal: Imbalance in normal sleep/wake cycle will improve Outcome: Progressing   

## 2018-09-09 NOTE — Plan of Care (Signed)
Patient has had no outbursts on the unit for second evening in a row.   Problem: Coping: Goal: Ability to demonstrate self-control will improve Outcome: Progressing

## 2018-09-09 NOTE — [facility] Group Notes (Signed)
LCSW Group Therapy Note  09/09/2018 1:00 PM  Type of Therapy and Topic:  Group Therapy:  Feelings around Relapse and Recovery  Participation Level:  Active   Description of Group:    Patients in this group will discuss emotions they experience before and after a relapse. They will process how experiencing these feelings, or avoidance of experiencing them, relates to having a relapse. Facilitator will guide patients to explore emotions they have related to recovery. Patients will be encouraged to process which emotions are more powerful. They will be guided to discuss the emotional reaction significant others in their lives may have to their relapse or recovery. Patients will be assisted in exploring ways to respond to the emotions of others without this contributing to a relapse.  Therapeutic Goals: 1. Patient will identify two or more emotions that lead to a relapse for them 2. Patient will identify two emotions that result when they relapse 3. Patient will identify two emotions related to recovery 4. Patient will demonstrate ability to communicate their needs through discussion and/or role plays   Summary of Patient Progress: Patient was an active participant in group.  Patient was supportive of other group members.  Patient shared how she relapses with her mental health, specifically her anger. Patient engaged in discussion on her coping skills and shared positive thoughts about using her coping skills effectively going forward.    Therapeutic Modalities:   Cognitive Behavioral Therapy Solution-Focused Therapy Assertiveness Training Relapse Prevention Therapy   Assunta Curtis, MSW, LCSW 09/09/2018 12:26 PM

## 2018-09-09 NOTE — Progress Notes (Signed)
After this morning's outburst pt has been compliant all day. She asked if I would call her brother and sister to tell them that she was "sorry". I did get in touch with Alver Fisher and she said the Kaleena could call her. They spoke.  I called the brother but I did not leave a message. Collier Bullock RN
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
## 2018-09-10 LAB — GLUCOSE, CAPILLARY
Glucose-Capillary: 208 mg/dL — ABNORMAL HIGH (ref 70–99)
Glucose-Capillary: 158 mg/dL — ABNORMAL HIGH (ref 70–99)
Glucose-Capillary: 180 mg/dL — ABNORMAL HIGH (ref 70–99)
Glucose-Capillary: 123 mg/dL — ABNORMAL HIGH (ref 70–99)

## 2018-09-10 NOTE — [facility] Group Notes (Signed)
[facility] LCSW Group Therapy Note    Date/Time: 09/10/2018 12:50PM   Type of Therapy and Topic: Group Therapy: Trust and Honesty    Participation Level:  Active   Description of Group:  In this group patients will be asked to explore value of being honest. Patients will be guided to discuss their thoughts, feelings, and behaviors related to honesty and trusting in others. Patients will process together how trust and honesty relate to how we form relationships with peers, family members, and self. Each patient will be challenged to identify and express feelings of being vulnerable. Patients will discuss reasons why people are dishonest and identify alternative outcomes if one was truthful (to self or others). This group will be process-oriented, with patients participating in exploration of their own experiences as well as giving and receiving support and challenge from other group members.    Therapeutic Goals:  1. Patient will identify why honesty is important to relationships and how honesty overall affects relationships.  2. Patient will identify a situation where they lied or were lied too and the feelings, thought process, and behaviors surrounding the situation  3. Patient will identify the meaning of being vulnerable, how that feels, and how that correlates to being honest with self and others.  4. Patient will identify situations where they could have told the truth, but instead lied and explain reasons of dishonesty.    Summary of Patient Progress  Group members engaged in discussion on trust and honesty. Group members shared times where they have been dishonest or people have broken their trust and how the relationship was effected. Group members shared why people break trust, and the importance of trust in a relationship. Each group member shared a person in their life that they can trust. Patient was present for about 35 minutes of group. While present, she participated in group discussion.  She identified those who are part of her support system who she can trust and will be able to be honest with about her thoughts and feelings after she discharges.    Therapeutic Modalities:  Cognitive Behavioral Therapy  Solution Focused Therapy  Motivational Interviewing  Brief Therapy    Netta Neat MSW, LCSW

## 2018-09-10 NOTE — Progress Notes (Signed)
[facility] MD Progress Note  09/10/2018 11:06 AM CHUMY MOMENT  MRN:  [id number] Subjective: Patient is a 53 year old female with a history of moderate degree of intellectual disability, personality disorder, bipolar disorder who was admitted to the psychiatric unit on 08/05/2018.  Objective: Patient is seen and examined.  Patient is a 53 year old female with the above-stated past psychiatric history who is seen in follow-up.  Patient is familiar to me from previous times that have worked on the unit.  Review of the electronic medical record revealed that she is still awaiting placement.  She had a good day the day before yesterday and also yesterday.  She denied any explosive behaviors.  She is taking her medications, and denied any side effects from them.  She denied any auditory or visual hallucinations.  She denied any suicidal or homicidal ideation.  She did state that she apparently had heard that her brother is abusing substances on the outside, and she is worried for him.  Her vital signs are stable, she is afebrile.  She slept 5 hours last night.  No new laboratories.  Principal Problem: Schizoaffective disorder, bipolar type ([place]) Diagnosis: Principal Problem:   Schizoaffective disorder, bipolar type ([place]) Active Problems:   Diabetes mellitus without complication ([place])   Intellectual disability   Agitation   Borderline personality disorder (H[facility])   HTN (hypertension)   Hypothyroidism   Bipolar disorder, unspecified ([place])  Total Time spent with patient: 20 minutes  Past Psychiatric History: See admission H&P  Past Medical History:  Past Medical History:  Diagnosis Date  . Anxiety   . Depression   . Diabetes mellitus without complication ([place])   . GERD (gastroesophageal reflux disease)   . Homicidal ideation   . Hypertension   . Hypothyroidism     Past Surgical History:  Procedure Laterality Date  . COLPOSCOPY    . FRACTURE SURGERY    . TRACHEOSTOMY     Family History: History  reviewed. No pertinent family history. Family Psychiatric  History: See admission H&P Social History:  Social History   Substance and Sexual Activity  Alcohol Use No  . Frequency: Never     Social History   Substance and Sexual Activity  Drug Use Yes  . Types: Marijuana, "Crack" cocaine   Comment: reports she has not done anything in a long time    Social History   Socioeconomic History  . Marital status: Single    Spouse name: Not on file  . Number of children: Not on file  . Years of education: Not on file  . Highest education level: Not on file  Occupational History  . Not on file  Social Needs  . Financial resource strain: Somewhat hard  . Food insecurity    Worry: Patient refused    Inability: Patient refused  . Transportation needs    Medical: Patient refused    Non-medical: Patient refused  Tobacco Use  . Smoking status: Former Research scientist (life sciences)  . Smokeless tobacco: Never Used  Substance and Sexual Activity  . Alcohol use: No    Frequency: Never  . Drug use: Yes    Types: Marijuana, "Crack" cocaine    Comment: reports she has not done anything in a long time  . Sexual activity: Not Currently    Birth control/protection: Abstinence  Lifestyle  . Physical activity    Days per week: Patient refused    Minutes per session: Patient refused  . Stress: Rather much  Relationships  . Social connections  Talks on phone: Patient refused    Gets together: Patient refused    Attends religious service: Patient refused    Active member of club or organization: Patient refused    Attends meetings of clubs or organizations: Patient refused    Relationship status: Patient refused  Other Topics Concern  . Not on file  Social History Narrative  . Not on file   Additional Social History:                         Sleep: Fair  Appetite:  Good  Current Medications: Current Facility-Administered Medications  Medication Dose Route Frequency Provider Last Rate  Last Dose  . acetaminophen (TYLENOL) tablet 650 mg  650 mg Oral Q6H PRN Money, Lowry Ram, FNP   650 mg at 09/02/18 [id number]  . alum & mag hydroxide-simeth (MAALOX/MYLANTA) 200-200-20 MG/5ML suspension 30 mL  30 mL Oral Q4H PRN Money, Lowry Ram, FNP      . atorvastatin (LIPITOR) tablet 10 mg  10 mg Oral q1800 Money, Lowry Ram, FNP   10 mg at 09/09/18 1657  . carvedilol (COREG) tablet 3.125 mg  3.125 mg Oral BID WC Sharma Covert, MD   3.125 mg at 09/10/18 0731  . DULoxetine (CYMBALTA) DR capsule 60 mg  60 mg Oral Daily Clapacs, Madie Reno, MD   60 mg at 09/10/18 0731  . ferrous sulfate tablet 325 mg  325 mg Oral BID WC Money, Lowry Ram, FNP   325 mg at 09/10/18 0731  . gabapentin (NEURONTIN) capsule 900 mg  900 mg Oral TID Clapacs, Madie Reno, MD   900 mg at 09/10/18 0731  . guanFACINE (INTUNIV) ER tablet 2 mg  2 mg Oral Daily Clapacs, John T, MD   2 mg at 09/10/18 0731  . hydrOXYzine (ATARAX/VISTARIL) tablet 50 mg  50 mg Oral Q6H PRN Clapacs, Madie Reno, MD   50 mg at 09/02/18 0736  . insulin aspart (novoLOG) injection 0-5 Units  0-5 Units Subcutaneous QHS Money, Lowry Ram, FNP   Stopped at 08/31/18 2144  . insulin aspart (novoLOG) injection 0-9 Units  0-9 Units Subcutaneous TID WC Money, Lowry Ram, FNP   3 Units at 09/10/18 0730  . insulin glargine (LANTUS) injection 15 Units  15 Units Subcutaneous QHS Sharma Covert, MD   15 Units at 09/09/18 2157  . lamoTRIgine (LAMICTAL) tablet 175 mg  175 mg Oral QHS Sharma Covert, MD   175 mg at 09/09/18 2157  . levothyroxine (SYNTHROID) tablet 150 mcg  150 mcg Oral Q0600 Money, Lowry Ram, FNP   150 mcg at 09/10/18 [id number]  . loratadine (CLARITIN) tablet 10 mg  10 mg Oral Daily Clapacs, Madie Reno, MD   10 mg at 09/10/18 0730  . LORazepam (ATIVAN) tablet 2 mg  2 mg Oral Q4H PRN Clapacs, Madie Reno, MD   2 mg at 09/06/18 2143   Or  . LORazepam (ATIVAN) injection 2 mg  2 mg Intramuscular Q4H PRN Clapacs, Madie Reno, MD   2 mg at 09/07/18 0732  . lurasidone (LATUDA) tablet 80 mg  80 mg  Oral BID WC Sharma Covert, MD   80 mg at 09/10/18 0731  . magnesium hydroxide (MILK OF MAGNESIA) suspension 30 mL  30 mL Oral Daily PRN Money, Darnelle Maffucci B, FNP      . metFORMIN (GLUCOPHAGE) tablet 1,000 mg  1,000 mg Oral BID WC Sharma Covert, MD   1,000 mg at 09/10/18 0731  .  moxifloxacin (VIGAMOX) 0.5 % ophthalmic solution 1 drop  1 drop Right Eye TID Money, Lowry Ram, FNP   1 drop at 09/10/18 0731  . neomycin-bacitracin-polymyxin (NEOSPORIN) ointment   Topical BID Sharma Covert, MD      . pantoprazole (PROTONIX) EC tablet 40 mg  40 mg Oral Daily Money, Lowry Ram, FNP   40 mg at 09/10/18 0731  . traZODone (DESYREL) tablet 100 mg  100 mg Oral QHS Money, Lowry Ram, FNP   100 mg at 09/09/18 2157  . ziprasidone (GEODON) injection 20 mg  20 mg Intramuscular Q12H PRN Clapacs, Madie Reno, MD   20 mg at 09/07/18 0732    Lab Results:  Results for orders placed or performed during the hospital encounter of 08/04/18 (from the past 48 hour(s))  Glucose, capillary     Status: Abnormal   Collection Time: 09/08/18 12:32 PM  Result Value Ref Range   Glucose-Capillary 143 (H) 70 - 99 mg/dL  Glucose, capillary     Status: Abnormal   Collection Time: 09/08/18  5:17 PM  Result Value Ref Range   Glucose-Capillary 124 (H) 70 - 99 mg/dL  Glucose, capillary     Status: Abnormal   Collection Time: 09/08/18  8:11 PM  Result Value Ref Range   Glucose-Capillary 105 (H) 70 - 99 mg/dL   Comment 1 Notify RN   Glucose, capillary     Status: Abnormal   Collection Time: 09/09/18  7:06 AM  Result Value Ref Range   Glucose-Capillary 180 (H) 70 - 99 mg/dL  Glucose, capillary     Status: Abnormal   Collection Time: 09/09/18 11:31 AM  Result Value Ref Range   Glucose-Capillary 143 (H) 70 - 99 mg/dL   Comment 1 Notify RN   Glucose, capillary     Status: Abnormal   Collection Time: 09/09/18  4:13 PM  Result Value Ref Range   Glucose-Capillary 134 (H) 70 - 99 mg/dL   Comment 1 Notify RN   Glucose, capillary      Status: Abnormal   Collection Time: 09/09/18  9:54 PM  Result Value Ref Range   Glucose-Capillary 111 (H) 70 - 99 mg/dL  Glucose, capillary     Status: Abnormal   Collection Time: 09/10/18  7:05 AM  Result Value Ref Range   Glucose-Capillary 208 (H) 70 - 99 mg/dL   Comment 1 Notify RN     Blood Alcohol level:  Lab Results  Component Value Date   ETH <10 07/19/2018   ETH <10 [id number]    Metabolic Disorder Labs: Lab Results  Component Value Date   HGBA1C 5.3 06/05/2018   MPG 105.41 06/05/2018   MPG 102.54 12/30/2017   No results found for: PROLACTIN Lab Results  Component Value Date   CHOL 155 06/05/2018   TRIG 93 06/05/2018   HDL 54 06/05/2018   CHOLHDL 2.9 06/05/2018   VLDL 19 06/05/2018   LDLCALC 82 06/05/2018   LDLCALC 90 12/30/2017    Physical Findings: AIMS: Facial and Oral Movements Muscles of Facial Expression: None, normal Lips and Perioral Area: None, normal Jaw: None, normal Tongue: None, normal,Extremity Movements Upper (arms, wrists, hands, fingers): None, normal Lower (legs, knees, ankles, toes): None, normal, Trunk Movements Neck, shoulders, hips: None, normal, Overall Severity Severity of abnormal movements (highest score from questions above): None, normal Incapacitation due to abnormal movements: None, normal Patient's awareness of abnormal movements (rate only patient's report): No Awareness, Dental Status Current problems with teeth and/or dentures?: Yes Does  patient usually wear dentures?: No  CIWA:    COWS:     Musculoskeletal: Strength & Muscle Tone: within normal limits Gait & Station: normal Patient leans: N/A  Psychiatric Specialty Exam: Physical Exam  Nursing note and vitals reviewed. Constitutional: She is oriented to person, place, and time. She appears well-developed and well-nourished.  HENT:  Head: Normocephalic and atraumatic.  Respiratory: Effort normal.  Neurological: She is alert and oriented to person, place, and  time.    ROS  Blood pressure 111/67, pulse 72, temperature 97.9 F (36.6 C), temperature source Oral, resp. rate 18, height 4\' 10"  (1.473 m), weight 72.6 kg, SpO2 98 %.Body mass index is 33.44 kg/m.  General Appearance: Casual  Eye Contact:  Fair  Speech:  Normal Rate  Volume:  Normal  Mood:  Euthymic  Affect:  Congruent  Thought Process:  Coherent and Descriptions of Associations: Intact  Orientation:  Full (Time, Place, and Person)  Thought Content:  Logical  Suicidal Thoughts:  No  Homicidal Thoughts:  No  Memory:  Immediate;   Fair Recent;   Fair Remote;   Fair  Judgement:  Intact  Insight:  Fair  Psychomotor Activity:  Normal  Concentration:  Concentration: Fair and Attention Span: Fair  Recall:  [employer] of Knowledge:  Fair  Language:  Good  Akathisia:  Negative  Handed:  Right  AIMS (if indicated):     Assets:  Desire for Improvement Resilience  ADL's:  Intact  Cognition:  Impaired,  Mild  Sleep:  Number of Hours: 5     Treatment Plan Summary: Daily contact with patient to assess and evaluate symptoms and progress in treatment, Medication management and Plan : Patient is seen and examined.  Patient is a 53 year old female with the above-stated past psychiatric history who is seen in follow-up.   Diagnosis: #1 schizoaffective disorder; bipolar type, #2 diabetes mellitus, #3 intellectual disability, #4 borderline personality disorder, #5 hypertension, #6 hypothyroidism.  Patient is seen in follow-up.  She is essentially unchanged from last visit.  She remained stable.  She denied any side effects to her current medications.  She denied any auditory or visual hallucinations.  She denied any suicidal or homicidal ideation.  No change to her current medications. 1.  Continue Lipitor 10 mg p.o. daily for hyperlipidemia. 2.  Continue carvedilol 3.125 mg p.o. twice daily for hypertension. 3.  Continue Cymbalta 60 mg p.o. daily for mood and depression as well as  anxiety. 4.  Continue ferrous sulfate 325 mg p.o. twice daily for anemia. 5.  Continue gabapentin 900 mg p.o. 3 times daily for mood stability and chronic pain. 6.  Continue Intuniv extended release 2 mg p.o. daily for focus, attention and mood regulation. 7.  Continue hydroxyzine 50 mg p.o. every 6 hours as needed anxiety. 8.  Continue sliding scale insulin for diabetes mellitus. 9.  Continue Lantus insulin 15 units subcu nightly for diabetes mellitus. 10.  Continue Lamictal 175 mg p.o. nightly for mood regulation. 11.  Continue levothyroxine 150 mcg p.o. daily for hypothyroidism. 12.  Continue Claritin 10 mg p.o. daily for seasonal allergies. 13.  Continue lorazepam 2 mg p.o. or IM every 4 hours as needed agitation. 13.  Continue Latuda 80 mg p.o. twice daily for mood stability and psychosis. 14.  Continue Protonix 40 mg p.o. daily for gastric protection. 15.  Continue Metformin 1000 mg p.o. twice daily for diabetes mellitus. 16.  Continue moxifloxacin 0.5% ophthalmic solution 1 drop in right eye 3 times  daily for ophthalmologic treatment. 17.  Continue Neosporin ointment to knees bilaterally. 18.  Continue trazodone 100 mg p.o. nightly for insomnia. 19.  Continue Geodon 20 mg IM every 12 hours as needed agitation. 20.  Disposition planning-in progress.  Sharma Covert, MD 09/10/2018, 11:06 AM

## 2018-09-10 NOTE — Plan of Care (Signed)
D: Patient denies SI/HI/AVH. Patient verbally contracts for safety. Patient is calm, cooperative and pleasant. Patient is seen in milieu interacting with peers. Patient has no complaints at this time. Patient is frequently at nurses station with requestions.  A: Patient was assessed by this nurse. Patient was oriented to unit. Patient's safety was maintained on unit. Q x 15 minute observation checks were completed for safety. Patient care plan was reviewed. Patient was offered support and encouragement. Patient was encourage to attend groups, participate in unit activities and continue with plan of care.    R: Patient has no complaints of pain at this time. Patient is receptive to treatment and safety maintained on unit. Patient adheres with scheduled medications.    Problem: Education: Goal: Knowledge of [place] [facility] Education information/materials will improve Outcome: Not Progressing   Problem: Coping: Goal: Ability to verbalize frustrations and anger appropriately will improve Outcome: Progressing Goal: Ability to demonstrate self-control will improve Outcome: Progressing   Problem: Safety: Goal: Periods of time without injury will increase Outcome: Progressing

## 2018-09-11 LAB — GLUCOSE, CAPILLARY
Glucose-Capillary: 137 mg/dL — ABNORMAL HIGH (ref 70–99)
Glucose-Capillary: 79 mg/dL (ref 70–99)
Glucose-Capillary: 96 mg/dL (ref 70–99)

## 2018-09-11 NOTE — Progress Notes (Signed)
[facility] MD Progress Note  09/11/2018 10:11 AM Jessica [name]  MRN:  [id number] Subjective:  Patient is a 53 year old female with a history of moderate degree of intellectual disability, personality disorder, bipolar disorder who was admitted to the psychiatric unit on 08/05/2018.  Objective: Patient is seen and examined.  Patient is a 53 year old female with the above-stated past psychiatric history who is seen in follow-up.  She is doing well.  She had no episodes of agitation yesterday.  She denied any suicidal or homicidal ideation.  She denied any auditory or visual hallucinations.  She denied any side effects to her current medications.  Her vital signs are stable, she is afebrile.  She slept 5 hours last night.  No new laboratories.  Principal Problem: Schizoaffective disorder, bipolar type ([place]) Diagnosis: Principal Problem:   Schizoaffective disorder, bipolar type ([place]) Active Problems:   Diabetes mellitus without complication ([place])   Intellectual disability   Agitation   Borderline personality disorder (H[facility])   HTN (hypertension)   Hypothyroidism   Bipolar disorder, unspecified ([place])  Total Time spent with patient: 15 minutes  Past Psychiatric History: See admission H&P  Past Medical History:  Past Medical History:  Diagnosis Date  . Anxiety   . Depression   . Diabetes mellitus without complication ([place])   . GERD (gastroesophageal reflux disease)   . Homicidal ideation   . Hypertension   . Hypothyroidism     Past Surgical History:  Procedure Laterality Date  . COLPOSCOPY    . FRACTURE SURGERY    . TRACHEOSTOMY     Family History: History reviewed. No pertinent family history. Family Psychiatric  History: See admission H&P Social History:  Social History   Substance and Sexual Activity  Alcohol Use No  . Frequency: Never     Social History   Substance and Sexual Activity  Drug Use Yes  . Types: Marijuana, "Crack" cocaine   Comment: reports she has not done  anything in a long time    Social History   Socioeconomic History  . Marital status: Single    Spouse name: Not on file  . Number of children: Not on file  . Years of education: Not on file  . Highest education level: Not on file  Occupational History  . Not on file  Social Needs  . Financial resource strain: Somewhat hard  . Food insecurity    Worry: Patient refused    Inability: Patient refused  . Transportation needs    Medical: Patient refused    Non-medical: Patient refused  Tobacco Use  . Smoking status: Former Research scientist (life sciences)  . Smokeless tobacco: Never Used  Substance and Sexual Activity  . Alcohol use: No    Frequency: Never  . Drug use: Yes    Types: Marijuana, "Crack" cocaine    Comment: reports she has not done anything in a long time  . Sexual activity: Not Currently    Birth control/protection: Abstinence  Lifestyle  . Physical activity    Days per week: Patient refused    Minutes per session: Patient refused  . Stress: Rather much  Relationships  . Social Herbalist on phone: Patient refused    Gets together: Patient refused    Attends religious service: Patient refused    Active member of club or organization: Patient refused    Attends meetings of clubs or organizations: Patient refused    Relationship status: Patient refused  Other Topics Concern  . Not on file  Social  History Narrative  . Not on file   Additional Social History:                         Sleep: Fair  Appetite:  Good  Current Medications: Current Facility-Administered Medications  Medication Dose Route Frequency Provider Last Rate Last Dose  . acetaminophen (TYLENOL) tablet 650 mg  650 mg Oral Q6H PRN Money, Lowry Ram, FNP   650 mg at 09/02/18 [id number]  . alum & mag hydroxide-simeth (MAALOX/MYLANTA) 200-200-20 MG/5ML suspension 30 mL  30 mL Oral Q4H PRN Money, Lowry Ram, FNP      . atorvastatin (LIPITOR) tablet 10 mg  10 mg Oral q1800 Money, Lowry Ram, FNP   10 mg at  09/10/18 1605  . carvedilol (COREG) tablet 3.125 mg  3.125 mg Oral BID WC Sharma Covert, MD   3.125 mg at 09/11/18 0816  . DULoxetine (CYMBALTA) DR capsule 60 mg  60 mg Oral Daily Clapacs, Madie Reno, MD   60 mg at 09/11/18 0815  . ferrous sulfate tablet 325 mg  325 mg Oral BID WC Money, Lowry Ram, FNP   325 mg at 09/11/18 0816  . gabapentin (NEURONTIN) capsule 900 mg  900 mg Oral TID Clapacs, Madie Reno, MD   900 mg at 09/11/18 0816  . guanFACINE (INTUNIV) ER tablet 2 mg  2 mg Oral Daily Clapacs, John T, MD   2 mg at 09/11/18 0815  . hydrOXYzine (ATARAX/VISTARIL) tablet 50 mg  50 mg Oral Q6H PRN Clapacs, Madie Reno, MD   50 mg at 09/10/18 1131  . insulin aspart (novoLOG) injection 0-5 Units  0-5 Units Subcutaneous QHS Money, Lowry Ram, FNP   Stopped at 08/31/18 2144  . insulin aspart (novoLOG) injection 0-9 Units  0-9 Units Subcutaneous TID WC Money, Lowry Ram, FNP   1 Units at 09/11/18 0815  . insulin glargine (LANTUS) injection 15 Units  15 Units Subcutaneous QHS Sharma Covert, MD   15 Units at 09/10/18 2125  . lamoTRIgine (LAMICTAL) tablet 175 mg  175 mg Oral QHS Sharma Covert, MD   175 mg at 09/10/18 2125  . levothyroxine (SYNTHROID) tablet 150 mcg  150 mcg Oral Q0600 Money, Lowry Ram, FNP   150 mcg at 09/11/18 [id number]  . loratadine (CLARITIN) tablet 10 mg  10 mg Oral Daily Clapacs, Madie Reno, MD   10 mg at 09/11/18 0816  . LORazepam (ATIVAN) tablet 2 mg  2 mg Oral Q4H PRN Clapacs, Madie Reno, MD   2 mg at 09/06/18 2143   Or  . LORazepam (ATIVAN) injection 2 mg  2 mg Intramuscular Q4H PRN Clapacs, Madie Reno, MD   2 mg at 09/07/18 0732  . lurasidone (LATUDA) tablet 80 mg  80 mg Oral BID WC Sharma Covert, MD   80 mg at 09/11/18 0815  . magnesium hydroxide (MILK OF MAGNESIA) suspension 30 mL  30 mL Oral Daily PRN Money, Darnelle Maffucci B, FNP      . metFORMIN (GLUCOPHAGE) tablet 1,000 mg  1,000 mg Oral BID WC Sharma Covert, MD   1,000 mg at 09/11/18 0816  . moxifloxacin (VIGAMOX) 0.5 % ophthalmic solution 1  drop  1 drop Right Eye TID Money, Lowry Ram, FNP   1 drop at 09/11/18 0815  . neomycin-bacitracin-polymyxin (NEOSPORIN) ointment   Topical BID Sharma Covert, MD      . pantoprazole (PROTONIX) EC tablet 40 mg  40 mg Oral Daily Money, Lowry Ram,  FNP   40 mg at 09/11/18 0816  . traZODone (DESYREL) tablet 100 mg  100 mg Oral QHS Money, Travis B, FNP   100 mg at 09/10/18 2124  . ziprasidone (GEODON) injection 20 mg  20 mg Intramuscular Q12H PRN Clapacs, Madie Reno, MD   20 mg at 09/07/18 0732    Lab Results:  Results for orders placed or performed during the hospital encounter of 08/04/18 (from the past 48 hour(s))  Glucose, capillary     Status: Abnormal   Collection Time: 09/09/18 11:31 AM  Result Value Ref Range   Glucose-Capillary 143 (H) 70 - 99 mg/dL   Comment 1 Notify RN   Glucose, capillary     Status: Abnormal   Collection Time: 09/09/18  4:13 PM  Result Value Ref Range   Glucose-Capillary 134 (H) 70 - 99 mg/dL   Comment 1 Notify RN   Glucose, capillary     Status: Abnormal   Collection Time: 09/09/18  9:54 PM  Result Value Ref Range   Glucose-Capillary 111 (H) 70 - 99 mg/dL  Glucose, capillary     Status: Abnormal   Collection Time: 09/10/18  7:05 AM  Result Value Ref Range   Glucose-Capillary 208 (H) 70 - 99 mg/dL   Comment 1 Notify RN   Glucose, capillary     Status: Abnormal   Collection Time: 09/10/18 11:19 AM  Result Value Ref Range   Glucose-Capillary 158 (H) 70 - 99 mg/dL   Comment 1 Notify RN   Glucose, capillary     Status: Abnormal   Collection Time: 09/10/18  4:04 PM  Result Value Ref Range   Glucose-Capillary 180 (H) 70 - 99 mg/dL   Comment 1 Notify RN   Glucose, capillary     Status: Abnormal   Collection Time: 09/10/18  8:53 PM  Result Value Ref Range   Glucose-Capillary 123 (H) 70 - 99 mg/dL   Comment 1 Notify RN   Glucose, capillary     Status: Abnormal   Collection Time: 09/11/18  7:36 AM  Result Value Ref Range   Glucose-Capillary 137 (H) 70 - 99  mg/dL    Blood Alcohol level:  Lab Results  Component Value Date   ETH <10 07/19/2018   ETH <10 [id number]    Metabolic Disorder Labs: Lab Results  Component Value Date   HGBA1C 5.3 06/05/2018   MPG 105.41 06/05/2018   MPG 102.54 12/30/2017   No results found for: PROLACTIN Lab Results  Component Value Date   CHOL 155 06/05/2018   TRIG 93 06/05/2018   HDL 54 06/05/2018   CHOLHDL 2.9 06/05/2018   VLDL 19 06/05/2018   LDLCALC 82 06/05/2018   LDLCALC 90 12/30/2017    Physical Findings: AIMS: Facial and Oral Movements Muscles of Facial Expression: None, normal Lips and Perioral Area: None, normal Jaw: None, normal Tongue: None, normal,Extremity Movements Upper (arms, wrists, hands, fingers): None, normal Lower (legs, knees, ankles, toes): None, normal, Trunk Movements Neck, shoulders, hips: None, normal, Overall Severity Severity of abnormal movements (highest score from questions above): None, normal Incapacitation due to abnormal movements: None, normal Patient's awareness of abnormal movements (rate only patient's report): No Awareness, Dental Status Current problems with teeth and/or dentures?: Yes Does patient usually wear dentures?: No  CIWA:    COWS:     Musculoskeletal: Strength & Muscle Tone: within normal limits Gait & Station: normal Patient leans: N/A  Psychiatric Specialty Exam: Physical Exam  Nursing note and vitals reviewed. Constitutional: She  is oriented to person, place, and time. She appears well-developed and well-nourished.  HENT:  Head: Normocephalic and atraumatic.  Respiratory: Effort normal.  Neurological: She is alert and oriented to person, place, and time.    ROS  Blood pressure 104/65, pulse 86, temperature 98.7 F (37.1 C), temperature source Oral, resp. rate 16, height 4\' 10"  (1.473 m), weight 72.6 kg, SpO2 97 %.Body mass index is 33.44 kg/m.  General Appearance: Casual  Eye Contact:  Fair  Speech:  Normal Rate  Volume:   Normal  Mood:  Euthymic  Affect:  Congruent  Thought Process:  Goal Directed and Descriptions of Associations: Intact  Orientation:  Full (Time, Place, and Person)  Thought Content:  Logical  Suicidal Thoughts:  No  Homicidal Thoughts:  No  Memory:  Immediate;   Fair Recent;   Fair Remote;   Fair  Judgement:  Intact  Insight:  Fair  Psychomotor Activity:  Normal  Concentration:  Concentration: Fair and Attention Span: Fair  Recall:  [employer] of Knowledge:  Fair  Language:  Good  Akathisia:  Negative  Handed:  Right  AIMS (if indicated):     Assets:  Desire for Improvement Resilience  ADL's:  Intact  Cognition:  Impaired,  Mild  Sleep:  Number of Hours: 5     Treatment Plan Summary: Daily contact with patient to assess and evaluate symptoms and progress in treatment, Medication management and Plan : Patient is seen and examined.  Patient is a 53 year old female with the above-stated past psychiatric history who is seen in follow-up.   Diagnosis: #1 schizoaffective disorder; bipolar type, #2 diabetes mellitus, #3 intellectual disability, #4 borderline personality disorder, #5 hypertension, #6 hypothyroidism.  Patient is seen in follow-up.  She is essentially unchanged from previous visits.  She is stable at this point.  No psychotic symptoms, no suicidal or homicidal ideation.  She has not had any outburst in a significant amount of time.  No change in her medications today. 1.  Continue Lipitor 10 mg p.o. daily for hyperlipidemia. 2.  Continue carvedilol 3.125 mg p.o. twice daily for hypertension. 3.  Continue Cymbalta 60 mg p.o. daily for mood and depression as well as anxiety. 4.  Continue ferrous sulfate 325 mg p.o. twice daily for anemia. 5.  Continue gabapentin 900 mg p.o. 3 times daily for mood stability and chronic pain. 6.  Continue Intuniv extended release 2 mg p.o. daily for focus, attention and mood regulation. 7.  Continue hydroxyzine 50 mg p.o. every 6 hours as  needed anxiety. 8.  Continue sliding scale insulin for diabetes mellitus. 9.  Continue Lantus insulin 15 units subcu nightly for diabetes mellitus. 10.  Continue Lamictal 175 mg p.o. nightly for mood regulation. 11.  Continue levothyroxine 150 mcg p.o. daily for hypothyroidism. 12.  Continue Claritin 10 mg p.o. daily for seasonal allergies. 13.  Continue lorazepam 2 mg p.o. or IM every 4 hours as needed agitation. 13.  Continue Latuda 80 mg p.o. twice daily for mood stability and psychosis. 14.  Continue Protonix 40 mg p.o. daily for gastric protection. 15.  Continue Metformin 1000 mg p.o. twice daily for diabetes mellitus. 16.  Continue moxifloxacin 0.5% ophthalmic solution 1 drop in right eye 3 times daily for ophthalmologic treatment. 17.  Continue Neosporin ointment to knees bilaterally. 18.  Continue trazodone 100 mg p.o. nightly for insomnia. 19.  Continue Geodon 20 mg IM every 12 hours as needed agitation. 20.  Disposition planning-in progress.  Sharma Covert,  MD 09/11/2018, 10:11 AM

## 2018-09-11 NOTE — BHH Group Notes (Signed)
LCSW Group Therapy Note 09/11/2018  1:00 PM   Type of Therapy and Topic: Group Therapy: Feelings Around Returning Home & Establishing a Supportive Framework and Supporting Oneself When Supports Not Available   Participation Level: Minimal   Description of Group:  Patients first processed thoughts and feelings about upcoming discharge. These included fears of upcoming changes, lack of change, new living environments, judgements and expectations from others and overall stigma of mental health issues. The group then discussed the definition of a supportive framework, what that looks and feels like, and how do to discern it from an unhealthy non-supportive network. The group identified different types of supports as well as what to do when your family/friends are less than helpful or unavailable   Therapeutic Goals  1. Patient will identify one healthy supportive network that they can use at discharge. 2. Patient will identify one factor of a supportive framework and how to tell it from an unhealthy network. 3. Patient able to identify one coping skill to use when they do not have positive supports from others. 4. Patient will demonstrate ability to communicate their needs through discussion and/or role plays.   Summary of Patient Progress:  Patient was presnt for the beginning of group and supportive of others.  Patient shared how she is frustrated with not having found a group home placement, yet.  Patient left group early.     Therapeutic Modalities Cognitive Behavioral Therapy Motivational Interviewing  Assunta Curtis, MSW, LCSW 09/11/2018 10:37 AM

## 2018-09-11 NOTE — Plan of Care (Signed)
D- Patient alert and oriented. Patient presents in a pleasant mood on assessment stating that she slept good last night and had no major complaints to voice to this Probation officer. Patient denies SI, HI, AVH, and pain at this time. Patient also denies any signs/symptoms of depression/anxiety. Patient's goal for today is to "work on goals; trying not to call anyone names (n-word); hitting anyone", in which she will "go to groups; go outside" in order to accomplish her goals.  A- Scheduled medications administered to patient, per MD orders. Support and encouragement provided.  Routine safety checks conducted every 15 minutes.  Patient informed to notify staff with problems or concerns.  R- No adverse drug reactions noted. Patient contracts for safety at this time. Patient compliant with medications and treatment plan. Patient receptive, calm, and cooperative. Patient interacts well with others on the unit.  Patient remains safe at this time.  Problem: Education: Goal: Knowledge of [place] [facility] Education information/materials will improve Outcome: Progressing   Problem: Coping: Goal: Ability to verbalize frustrations and anger appropriately will improve Outcome: Progressing Goal: Ability to demonstrate self-control will improve Outcome: Progressing   Problem: Safety: Goal: Periods of time without injury will increase Outcome: Progressing   Problem: Safety: Goal: Ability to redirect hostility and anger into socially appropriate behaviors will improve Outcome: Progressing   Problem: Activity: Goal: Interest or engagement in leisure activities will improve Outcome: Progressing Goal: Imbalance in normal sleep/wake cycle will improve Outcome: Progressing

## 2018-09-11 NOTE — Progress Notes (Signed)
Patient has been pleasant upon approach , calm and cooperating , found sitting in the milieu with other peer members, socialization was appropriate with out any issues and  No out burst , mood and affect is good , compliant with her medications , denies any SI/HI/AVH at this time encourage patient with support , and education, patient has no complains , states I am doing fine today, patient states appetite is good and no distress.

## 2018-09-12 LAB — GLUCOSE, CAPILLARY
Glucose-Capillary: 150 mg/dL — ABNORMAL HIGH (ref 70–99)
Glucose-Capillary: 92 mg/dL (ref 70–99)
Glucose-Capillary: 116 mg/dL — ABNORMAL HIGH (ref 70–99)
Glucose-Capillary: 117 mg/dL — ABNORMAL HIGH (ref 70–99)

## 2018-09-12 NOTE — BHH Group Notes (Signed)
Overcoming Obstacles  09/12/2018 1PM  Type of Therapy and Topic:  Group Therapy:  Overcoming Obstacles  Participation Level:  Active    Description of Group:    In this group patients will be encouraged to explore what they see as obstacles to their own wellness and recovery. They will be guided to discuss their thoughts, feelings, and behaviors related to these obstacles. The group will process together ways to cope with barriers, with attention given to specific choices patients can make. Each patient will be challenged to identify changes they are motivated to make in order to overcome their obstacles. This group will be process-oriented, with patients participating in exploration of their own experiences as well as giving and receiving support and challenge from other group members.   Therapeutic Goals: 1. Patient will identify personal and current obstacles as they relate to admission. 2. Patient will identify barriers that currently interfere with their wellness or overcoming obstacles.  3. Patient will identify feelings, thought process and behaviors related to these barriers. 4. Patient will identify two changes they are willing to make to overcome these obstacles:      Summary of Patient Progress Patient discussed with group how she overcame unhealthy relationships. Patient discussed a past drug addiction but says she has been sober for over 20 years. Patient says one of her relatives and attending 12 step programs assisted her in overcoming her addiction.     Therapeutic Modalities:   Cognitive Behavioral Therapy Solution Focused Therapy Motivational Interviewing Relapse Prevention Therapy    Sanjuana Kava, MSW, LCSW 09/12/2018 2:07 PM

## 2018-09-12 NOTE — Progress Notes (Signed)
Recreation Therapy Notes   Date: 09/12/2018  Time: 9:30 am  Location: Craft room  Behavioral response: Appropriate   Intervention Topic: Communication   Discussion/Intervention:  Group content today was focused on communication. The group defined communication and ways to communicate with others. Individuals stated reason why communication is important and some reasons to communicate with others. Patients expressed if they thought they were good at communicating with others and ways they could improve their communication skills. The group identified important parts of communication and some experiences they have had in the past with communication. The group participated in the intervention "What is that?", where they had a chance to test out their communication skills and identify ways to improve their communication techniques.  Clinical Observations/Feedback:  Patient came to group and defined communication as explaining. She stated that saying the right thing could help a lot with communication.  Individual was social with peers and staff while participating in the intervention.  Raylene Carmickle LRT/CTRS            Enoch Moffa 09/12/2018 11:28 AM

## 2018-09-12 NOTE — Plan of Care (Signed)
D- Patient alert and oriented. Patient presents in a pleasant mood on assessment reporting that she slept "good" last night and had no complaints to voice to this Probation officer. Patient denies any signs/symptoms of depression, however, she did report an anxiety level of "1/10", but did not elaborate on this during assessment. Patient also denies SI, HI, AVH, and pain at this time. Patient's goal for today is to "go to group, doctor, and social worker", in which she will "not being anger" in order to accomplish her goal.  A- Scheduled medications administered to patient, per MD orders. Support and encouragement provided.  Routine safety checks conducted every 15 minutes.  Patient informed to notify staff with problems or concerns.  R- No adverse drug reactions noted. Patient contracts for safety at this time. Patient compliant with medications and treatment plan. Patient receptive, calm, and cooperative. Patient interacts well with others on the unit.  Patient remains safe at this time.  Problem: Education: Goal: Knowledge of [place] [facility] Education information/materials will improve Outcome: Progressing   Problem: Coping: Goal: Ability to verbalize frustrations and anger appropriately will improve Outcome: Progressing Goal: Ability to demonstrate self-control will improve Outcome: Progressing   Problem: Safety: Goal: Periods of time without injury will increase Outcome: Progressing   Problem: Safety: Goal: Ability to redirect hostility and anger into socially appropriate behaviors will improve Outcome: Progressing   Problem: Activity: Goal: Interest or engagement in leisure activities will improve Outcome: Progressing Goal: Imbalance in normal sleep/wake cycle will improve Outcome: Progressing

## 2018-09-12 NOTE — Plan of Care (Signed)
  Problem: Education: Goal: Knowledge of [place] [facility] Education information/materials will improve Outcome: Progressing   Problem: Coping: Goal: Ability to verbalize frustrations and anger appropriately will improve Outcome: Progressing Goal: Ability to demonstrate self-control will improve Outcome: Progressing   Problem: Safety: Goal: Periods of time without injury will increase Outcome: Progressing   Problem: Safety: Goal: Ability to redirect hostility and anger into socially appropriate behaviors will improve Outcome: Progressing   Problem: Activity: Goal: Interest or engagement in leisure activities will improve Outcome: Progressing Goal: Imbalance in normal sleep/wake cycle will improve Outcome: Progressing   

## 2018-09-12 NOTE — Progress Notes (Signed)
Patient has been maintaining at base line , no out burst , maintaining routine ADLs and safety in the unit, takes her medications regularly without any side effects , appetite is good affect and mood has been good , patient has not requested any PRNs at this time . Denies SI/HI/AVH and no signs of delusions or erratic behaviors , support and encouragement is provided, encouraged coping skills, monitored every 15 minutes for safety no distress.

## 2018-09-12 NOTE — BHH Counselor (Signed)
CSW spoke with Gwyndolyn Saxon at [facility] to check on the status of pt referral. Gwyndolyn Saxon reports the Director of the Acute Unit is requesting notes for past 72 hours to further review and  determine if pt will need to be prioritized a bed at [facility]. CSW explained to Gwyndolyn Saxon that the notes from this past weekend reflect the pt did not display behavior concerns however the pt has an extensive history of verbal and physical aggression that has been displayed in and out of the hospital that warrants a bed placement and being on the high priority list.

## 2018-09-12 NOTE — Progress Notes (Signed)
[facility] MD Progress Note  09/12/2018 2:25 PM Jessica [name]  MRN:  [id number] Subjective: Follow-up for this patient with unpredictable mood explosiveness.  At the moment I saw her she was doing good but she continues to be prone to explosive episodes of mood and behavior on a regular basis.  No specific new complaint herself.  Still frustrated about lack of movement Principal Problem: Schizoaffective disorder, bipolar type ([place]) Diagnosis: Principal Problem:   Schizoaffective disorder, bipolar type ([place]) Active Problems:   Diabetes mellitus without complication ([place])   Intellectual disability   Agitation   Borderline personality disorder (HCC)   HTN (hypertension)   Hypothyroidism   Bipolar disorder, unspecified ([place])  Total Time spent with patient: 15 minutes  Past Psychiatric History: Patient has a long history of extreme behavior problems multiple hospitalizations  Past Medical History:  Past Medical History:  Diagnosis Date  . Anxiety   . Depression   . Diabetes mellitus without complication ([place])   . GERD (gastroesophageal reflux disease)   . Homicidal ideation   . Hypertension   . Hypothyroidism     Past Surgical History:  Procedure Laterality Date  . COLPOSCOPY    . FRACTURE SURGERY    . TRACHEOSTOMY     Family History: History reviewed. No pertinent family history. Family Psychiatric  History: See previous Social History:  Social History   Substance and Sexual Activity  Alcohol Use No  . Frequency: Never     Social History   Substance and Sexual Activity  Drug Use Yes  . Types: Marijuana, "Crack" cocaine   Comment: reports she has not done anything in a long time    Social History   Socioeconomic History  . Marital status: Single    Spouse name: Not on file  . Number of children: Not on file  . Years of education: Not on file  . Highest education level: Not on file  Occupational History  . Not on file  Social Needs  . Financial resource strain:  Somewhat hard  . Food insecurity    Worry: Patient refused    Inability: Patient refused  . Transportation needs    Medical: Patient refused    Non-medical: Patient refused  Tobacco Use  . Smoking status: Former Research scientist (life sciences)  . Smokeless tobacco: Never Used  Substance and Sexual Activity  . Alcohol use: No    Frequency: Never  . Drug use: Yes    Types: Marijuana, "Crack" cocaine    Comment: reports she has not done anything in a long time  . Sexual activity: Not Currently    Birth control/protection: Abstinence  Lifestyle  . Physical activity    Days per week: Patient refused    Minutes per session: Patient refused  . Stress: Rather much  Relationships  . Social Herbalist on phone: Patient refused    Gets together: Patient refused    Attends religious service: Patient refused    Active member of club or organization: Patient refused    Attends meetings of clubs or organizations: Patient refused    Relationship status: Patient refused  Other Topics Concern  . Not on file  Social History Narrative  . Not on file   Additional Social History:                         Sleep: Fair  Appetite:  Fair  Current Medications: Current Facility-Administered Medications  Medication Dose Route Frequency Provider Last Rate  Last Dose  . acetaminophen (TYLENOL) tablet 650 mg  650 mg Oral Q6H PRN Money, Lowry Ram, FNP   650 mg at 09/02/18 [id number]  . alum & mag hydroxide-simeth (MAALOX/MYLANTA) 200-200-20 MG/5ML suspension 30 mL  30 mL Oral Q4H PRN Money, Lowry Ram, FNP      . atorvastatin (LIPITOR) tablet 10 mg  10 mg Oral q1800 Money, Lowry Ram, FNP   10 mg at 09/11/18 1718  . carvedilol (COREG) tablet 3.125 mg  3.125 mg Oral BID WC Sharma Covert, MD   3.125 mg at 09/12/18 0803  . DULoxetine (CYMBALTA) DR capsule 60 mg  60 mg Oral Daily Jarmal Lewelling, Madie Reno, MD   60 mg at 09/12/18 0803  . ferrous sulfate tablet 325 mg  325 mg Oral BID WC Money, Lowry Ram, FNP   325 mg at  09/12/18 [id number]  . gabapentin (NEURONTIN) capsule 900 mg  900 mg Oral TID Seham Gardenhire, Madie Reno, MD   900 mg at 09/12/18 1205  . guanFACINE (INTUNIV) ER tablet 2 mg  2 mg Oral Daily Kimyatta Lecy, Madie Reno, MD   2 mg at 09/12/18 0803  . hydrOXYzine (ATARAX/VISTARIL) tablet 50 mg  50 mg Oral Q6H PRN Katalina Magri, Madie Reno, MD   50 mg at 09/10/18 1131  . insulin aspart (novoLOG) injection 0-5 Units  0-5 Units Subcutaneous QHS Money, Lowry Ram, FNP   Stopped at 08/31/18 2144  . insulin aspart (novoLOG) injection 0-9 Units  0-9 Units Subcutaneous TID WC Money, Lowry Ram, FNP   1 Units at 09/12/18 0802  . insulin glargine (LANTUS) injection 15 Units  15 Units Subcutaneous QHS Sharma Covert, MD   15 Units at 09/11/18 2139  . lamoTRIgine (LAMICTAL) tablet 175 mg  175 mg Oral QHS Sharma Covert, MD   175 mg at 09/11/18 2138  . levothyroxine (SYNTHROID) tablet 150 mcg  150 mcg Oral Q0600 Money, Lowry Ram, FNP   150 mcg at 09/12/18 [id number]  . loratadine (CLARITIN) tablet 10 mg  10 mg Oral Daily Judy Pollman, Madie Reno, MD   10 mg at 09/12/18 0804  . LORazepam (ATIVAN) tablet 2 mg  2 mg Oral Q4H PRN Mahira Petras, Madie Reno, MD   2 mg at 09/06/18 2143   Or  . LORazepam (ATIVAN) injection 2 mg  2 mg Intramuscular Q4H PRN Cortland Crehan, Madie Reno, MD   2 mg at 09/07/18 0732  . lurasidone (LATUDA) tablet 80 mg  80 mg Oral BID WC Sharma Covert, MD   80 mg at 09/12/18 0803  . magnesium hydroxide (MILK OF MAGNESIA) suspension 30 mL  30 mL Oral Daily PRN Money, Darnelle Maffucci B, FNP      . metFORMIN (GLUCOPHAGE) tablet 1,000 mg  1,000 mg Oral BID WC Sharma Covert, MD   1,000 mg at 09/12/18 0803  . moxifloxacin (VIGAMOX) 0.5 % ophthalmic solution 1 drop  1 drop Right Eye TID Money, Lowry Ram, FNP   1 drop at 09/12/18 1206  . neomycin-bacitracin-polymyxin (NEOSPORIN) ointment   Topical BID Sharma Covert, MD      . pantoprazole (PROTONIX) EC tablet 40 mg  40 mg Oral Daily Money, Lowry Ram, FNP   40 mg at 09/12/18 [id number]  . traZODone (DESYREL) tablet 100 mg   100 mg Oral QHS Money, Travis B, FNP   100 mg at 09/11/18 2138  . ziprasidone (GEODON) injection 20 mg  20 mg Intramuscular Q12H PRN Laronn Devonshire, Madie Reno, MD   20 mg at 09/07/18 [phone]  Lab Results:  Results for orders placed or performed during the hospital encounter of 08/04/18 (from the past 48 hour(s))  Glucose, capillary     Status: Abnormal   Collection Time: 09/10/18  4:04 PM  Result Value Ref Range   Glucose-Capillary 180 (H) 70 - 99 mg/dL   Comment 1 Notify RN   Glucose, capillary     Status: Abnormal   Collection Time: 09/10/18  8:53 PM  Result Value Ref Range   Glucose-Capillary 123 (H) 70 - 99 mg/dL   Comment 1 Notify RN   Glucose, capillary     Status: Abnormal   Collection Time: 09/11/18  7:36 AM  Result Value Ref Range   Glucose-Capillary 137 (H) 70 - 99 mg/dL  Glucose, capillary     Status: None   Collection Time: 09/11/18 11:35 AM  Result Value Ref Range   Glucose-Capillary 79 70 - 99 mg/dL   Comment 1 Notify RN   Glucose, capillary     Status: None   Collection Time: 09/11/18  4:33 PM  Result Value Ref Range   Glucose-Capillary 96 70 - 99 mg/dL  Glucose, capillary     Status: Abnormal   Collection Time: 09/12/18  7:03 AM  Result Value Ref Range   Glucose-Capillary 150 (H) 70 - 99 mg/dL   Comment 1 Notify RN   Glucose, capillary     Status: None   Collection Time: 09/12/18 11:18 AM  Result Value Ref Range   Glucose-Capillary 92 70 - 99 mg/dL   Comment 1 Notify RN     Blood Alcohol level:  Lab Results  Component Value Date   ETH <10 07/19/2018   ETH <10 [id number]    Metabolic Disorder Labs: Lab Results  Component Value Date   HGBA1C 5.3 06/05/2018   MPG 105.41 06/05/2018   MPG 102.54 12/30/2017   No results found for: PROLACTIN Lab Results  Component Value Date   CHOL 155 06/05/2018   TRIG 93 06/05/2018   HDL 54 06/05/2018   CHOLHDL 2.9 06/05/2018   VLDL 19 06/05/2018   LDLCALC 82 06/05/2018   LDLCALC 90 12/30/2017    Physical  Findings: AIMS: Facial and Oral Movements Muscles of Facial Expression: None, normal Lips and Perioral Area: None, normal Jaw: None, normal Tongue: None, normal,Extremity Movements Upper (arms, wrists, hands, fingers): None, normal Lower (legs, knees, ankles, toes): None, normal, Trunk Movements Neck, shoulders, hips: None, normal, Overall Severity Severity of abnormal movements (highest score from questions above): None, normal Incapacitation due to abnormal movements: None, normal Patient's awareness of abnormal movements (rate only patient's report): No Awareness, Dental Status Current problems with teeth and/or dentures?: Yes Does patient usually wear dentures?: No  CIWA:    COWS:     Musculoskeletal: Strength & Muscle Tone: within normal limits Gait & Station: normal Patient leans: N/A  Psychiatric Specialty Exam: Physical Exam  Nursing note and vitals reviewed. Constitutional: She appears well-developed and well-nourished.  HENT:  Head: Normocephalic and atraumatic.  Eyes: Pupils are equal, round, and reactive to light. Conjunctivae are normal.  Neck: Normal range of motion.  Cardiovascular: Regular rhythm and normal heart sounds.  Respiratory: Effort normal. No respiratory distress.  GI: Soft.  Musculoskeletal: Normal range of motion.  Neurological: She is alert.  Skin: Skin is warm and dry.  Psychiatric: Her affect is blunt. Her speech is delayed. She is slowed. Cognition and memory are impaired. She expresses impulsivity. She expresses no homicidal and no suicidal ideation.    Review  of Systems  Constitutional: Negative.   HENT: Negative.   Eyes: Negative.   Respiratory: Negative.   Cardiovascular: Negative.   Gastrointestinal: Negative.   Musculoskeletal: Negative.   Skin: Negative.   Neurological: Negative.   Psychiatric/Behavioral: Negative.     Blood pressure 116/74, pulse 76, temperature 98.8 F (37.1 C), temperature source Oral, resp. rate 16, height  4\' 10"  (1.473 m), weight 72.6 kg, SpO2 95 %.Body mass index is 33.44 kg/m.  General Appearance: Casual  Eye Contact:  Fair  Speech:  Slow  Volume:  Decreased  Mood:  Euthymic  Affect:  Blunt  Thought Process:  Coherent  Orientation:  Full (Time, Place, and Person)  Thought Content:  Logical  Suicidal Thoughts:  No  Homicidal Thoughts:  No  Memory:  Immediate;   Fair Recent;   Fair Remote;   Poor  Judgement:  Impaired  Insight:  Shallow  Psychomotor Activity:  Decreased  Concentration:  Concentration: Fair  Recall:  [employer] of Knowledge:  Fair  Language:  Fair  Akathisia:  No  Handed:  Right  AIMS (if indicated):     Assets:  Desire for Improvement Resilience  ADL's:  Impaired  Cognition:  Impaired,  Mild  Sleep:  Number of Hours: 8     Treatment Plan Summary: Daily contact with patient to assess and evaluate symptoms and progress in treatment, Medication management and Plan Continue current medicine.  Continue close monitoring for behavior problems and tendency towards violence.  We had been told that the patient was on the [facility].  Unclear what is going on with that.  We will follow-up with that while we also look into other options for longer term treatment  Alethia Berthold, MD 09/12/2018, 2:25 PM

## 2018-09-13 LAB — GLUCOSE, CAPILLARY
Glucose-Capillary: 202 mg/dL — ABNORMAL HIGH (ref 70–99)
Glucose-Capillary: 136 mg/dL — ABNORMAL HIGH (ref 70–99)
Glucose-Capillary: 120 mg/dL — ABNORMAL HIGH (ref 70–99)
Glucose-Capillary: 116 mg/dL — ABNORMAL HIGH (ref 70–99)

## 2018-09-13 NOTE — Progress Notes (Signed)
D - Patient was in the dayroom upon arrival to the unit. Patient was pleasant during assessment. Patient denies SI/HI/AVH, pain, anxiety and depression with this writer. Patient had no outbursts on the unit this evening.   A - Patient compliant with medication administration per MD orders and procedures on the unit. Patient given education. Patient given support and encouragement to be active in her treatment plan. Patient informed to let staff know if there are any issues or problems on the unit.   R - Patient being monitored Q 15 minutes for safety per unit protocol. Patient remains safe on the unit.  

## 2018-09-13 NOTE — Progress Notes (Signed)
Supervisor spoke with [facility] staff in admissions who confirmed that pt remains on the priority waiting list. Winferd Humphrey, MSW, LCSW Advanced Care Supervisor 09/13/2018 3:08 PM

## 2018-09-13 NOTE — Progress Notes (Signed)
Patient came up to the nurse's station during report and stated "I need to talk to [facility] now". This Probation officer asked patient what was wrong and she stated "I'm tired of being here, I don't want to be here anymore". This writer attempted to say to patient that finding her a new group home is taking longer than expected, but patient stated "I don't care anymore" and walked off and went down to her room and slammed her door. About five minutes went by and patient came out of her room throwing her pictures, journal book, and her glasses out in the hallway. This writer went into the medication room to draw up PRN Geodon, and patient came out of her room throwing her laundry and laundry basket into the hallway yelling "I'm tired of being here with you niggers". This writer went down to administer PRN injection and patient stated "I don't want to be here anymore, yall aren't doing anything". This writer attempted to talk to patient and she stated "I'm not eating today or taking my medicine". This writer walked out of patient's room and she stated "you bitch".
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
## 2018-09-13 NOTE — Plan of Care (Signed)
Patient continues to show improvement with this Probation officer. Patient didn't have any outbursts on the unit and was compliant with procedures of the unit.   Problem: Coping: Goal: Ability to demonstrate self-control will improve Outcome: Progressing

## 2018-09-13 NOTE — Plan of Care (Signed)
Patient stated to this writer that she lost her temper today had to be given shots. Patient says she is tired of being here and it makes her angry.   Problem: Coping: Goal: Ability to demonstrate self-control will improve Outcome: Not Progressing

## 2018-09-13 NOTE — Progress Notes (Signed)
D - Patient was in the dayroom upon arrival to the unit. Patient was pleasant during assessment. Patient denies SI/HI/AVH, pain, anxiety and depression with this Probation officer. Patient had no outbursts on the unit this evening. Patient asked for more clothes, patient given education.   A - Patient compliant with medication administration per MD orders and procedures on the unit. Patient given education. Patient given support and encouragement to be active in her treatment plan. Patient informed to let staff know if there are any issues or problems on the unit.   R - Patient being monitored Q 15 minutes for safety per unit protocol. Patient remains safe on the unit.

## 2018-09-13 NOTE — Plan of Care (Signed)
D- Patient alert and oriented. Patient presented in an angry/agitated at the time of assessment because she's tired of being in the hospital. Patient will not answer any of this writer's questions. Patient is stating that she doesn't care anymore and has not used any of her coping skills when angry. Patient normally denies SI/HI/AVH and pain, however, she is not answering any questions from this writer, just yelling at this writer saying "I don't like you niggers or that white nurse".   A- Patient refused medications after her tantrum. Support and encouragement provided to patient. Routine safety checks conducted every 15 minutes.  Patient informed to notify staff with problems or concerns.  R- No adverse drug reactions noted. Patient contracts for safety at this time. Patient non compliant with medications this morning. Patient not receptive to this Probation officer, nor other staff, and uncooperative. Patient remains safe at this time.  Problem: Education: Goal: Knowledge of [facility] Education information/materials will improve 09/13/2018 1608 by Lyda Kalata, RN Outcome: Not Progressing 09/13/2018 1043 by Lyda Kalata, RN Outcome: Progressing   Problem: Coping: Goal: Ability to verbalize frustrations and anger appropriately will improve 09/13/2018 1608 by Lyda Kalata, RN Outcome: Not Progressing 09/13/2018 1043 by Lyda Kalata, RN Outcome: Progressing Goal: Ability to demonstrate self-control will improve 09/13/2018 1608 by Lyda Kalata, RN Outcome: Not Progressing 09/13/2018 1043 by Lyda Kalata, RN Outcome: Progressing   Problem: Safety: Goal: Periods of time without injury will increase 09/13/2018 1608 by Lyda Kalata, RN Outcome: Not Progressing 09/13/2018 1043 by Lyda Kalata, RN Outcome: Progressing   Problem: Safety: Goal: Ability to redirect hostility and anger into socially appropriate behaviors will improve 09/13/2018 1608 by  Lyda Kalata, RN Outcome: Not Progressing 09/13/2018 1043 by Lyda Kalata, RN Outcome: Progressing   Problem: Activity: Goal: Interest or engagement in leisure activities will improve 09/13/2018 1608 by Lyda Kalata, RN Outcome: Not Progressing 09/13/2018 1043 by Lyda Kalata, RN Outcome: Progressing Goal: Imbalance in normal sleep/wake cycle will improve 09/13/2018 1608 by Lyda Kalata, RN Outcome: Not Progressing 09/13/2018 1043 by Lyda Kalata, RN Outcome: Progressing

## 2018-09-13 NOTE — Progress Notes (Signed)
Recreation Therapy Notes   Date: 09/13/2018  Time: 9:30 am   Location: Craft room   Behavioral response: N/A   Intervention Topic: Goals  Discussion/Intervention: Patient did not attend group.   Clinical Observations/Feedback:  Patient did not attend group.   Grayton Lobo LRT/CTRS        Georgia Baria 09/13/2018 11:45 AM

## 2018-09-14 LAB — GLUCOSE, CAPILLARY
Glucose-Capillary: 139 mg/dL — ABNORMAL HIGH (ref 70–99)
Glucose-Capillary: 182 mg/dL — ABNORMAL HIGH (ref 70–99)
Glucose-Capillary: 161 mg/dL — ABNORMAL HIGH (ref 70–99)
Glucose-Capillary: 141 mg/dL — ABNORMAL HIGH (ref 70–99)

## 2018-09-14 NOTE — Progress Notes (Signed)
[facility] MD Progress Note  09/14/2018 1:40 PM Jessica [name]  MRN:  [id number] Subjective: Patient seen chart reviewed.  Patient came to my office to have a little chat today which I welcome home.  She wants to tell me that she is having a good day today.  Has not lost her temper or blown up at anybody.  I unfortunately had to tell her we had no progress in finding a disposition for her.  Medically she seems to be doing well.  Tolerating medicine okay.  We had a little talk today since she was in a relatively calm mood about her use of the "n" word, and how that was probably the single most hurtful and unacceptable thing that she did and would be a good thing for her to work on.  She showed some insight and agreed with me that that is 1 of her goals. Principal Problem: Schizoaffective disorder, bipolar type ([place]) Diagnosis: Principal Problem:   Schizoaffective disorder, bipolar type ([place]) Active Problems:   Diabetes mellitus without complication ([place])   Intellectual disability   Agitation   Borderline personality disorder ([facility])   HTN (hypertension)   Hypothyroidism   Bipolar disorder, unspecified ([place])  Total Time spent with patient: 30 minutes  Past Psychiatric History: Patient has a longtime history of developmental disability and behavior problems.  Past Medical History:  Past Medical History:  Diagnosis Date  . Anxiety   . Depression   . Diabetes mellitus without complication ([place])   . GERD (gastroesophageal reflux disease)   . Homicidal ideation   . Hypertension   . Hypothyroidism     Past Surgical History:  Procedure Laterality Date  . COLPOSCOPY    . FRACTURE SURGERY    . TRACHEOSTOMY     Family History: History reviewed. No pertinent family history. Family Psychiatric  History: None reported Social History:  Social History   Substance and Sexual Activity  Alcohol Use No  . Frequency: Never     Social History   Substance and Sexual Activity  Drug Use Yes  . Types:  Marijuana, "Crack" cocaine   Comment: reports she has not done anything in a long time    Social History   Socioeconomic History  . Marital status: Single    Spouse name: Not on file  . Number of children: Not on file  . Years of education: Not on file  . Highest education level: Not on file  Occupational History  . Not on file  Social Needs  . Financial resource strain: Somewhat hard  . Food insecurity    Worry: Patient refused    Inability: Patient refused  . Transportation needs    Medical: Patient refused    Non-medical: Patient refused  Tobacco Use  . Smoking status: Former Research scientist (life sciences)  . Smokeless tobacco: Never Used  Substance and Sexual Activity  . Alcohol use: No    Frequency: Never  . Drug use: Yes    Types: Marijuana, "Crack" cocaine    Comment: reports she has not done anything in a long time  . Sexual activity: Not Currently    Birth control/protection: Abstinence  Lifestyle  . Physical activity    Days per week: Patient refused    Minutes per session: Patient refused  . Stress: Rather much  Relationships  . Social connections    Talks on phone: Patient refused    Gets together: Patient refused    Attends religious service: Patient refused    Active member of club  or organization: Patient refused    Attends meetings of clubs or organizations: Patient refused    Relationship status: Patient refused  Other Topics Concern  . Not on file  Social History Narrative  . Not on file   Additional Social History:                         Sleep: Fair  Appetite:  Fair  Current Medications: Current Facility-Administered Medications  Medication Dose Route Frequency Provider Last Rate Last Dose  . acetaminophen (TYLENOL) tablet 650 mg  650 mg Oral Q6H PRN Money, Lowry Ram, FNP   650 mg at 09/02/18 [id number]  . alum & mag hydroxide-simeth (MAALOX/MYLANTA) 200-200-20 MG/5ML suspension 30 mL  30 mL Oral Q4H PRN Money, Lowry Ram, FNP      . atorvastatin (LIPITOR)  tablet 10 mg  10 mg Oral q1800 Money, Lowry Ram, FNP   10 mg at 09/13/18 1700  . carvedilol (COREG) tablet 3.125 mg  3.125 mg Oral BID WC Sharma Covert, MD   3.125 mg at 09/14/18 [id number]  . DULoxetine (CYMBALTA) DR capsule 60 mg  60 mg Oral Daily , Madie Reno, MD   60 mg at 09/14/18 [id number]  . ferrous sulfate tablet 325 mg  325 mg Oral BID WC Money, Lowry Ram, FNP   325 mg at 09/14/18 0727  . gabapentin (NEURONTIN) capsule 900 mg  900 mg Oral TID ,  T, MD   900 mg at 09/14/18 1140  . guanFACINE (INTUNIV) ER tablet 2 mg  2 mg Oral Daily ,  T, MD   2 mg at 09/14/18 0729  . hydrOXYzine (ATARAX/VISTARIL) tablet 50 mg  50 mg Oral Q6H PRN , Madie Reno, MD   50 mg at 09/10/18 1131  . insulin aspart (novoLOG) injection 0-5 Units  0-5 Units Subcutaneous QHS Money, Lowry Ram, FNP   Stopped at 08/31/18 2144  . insulin aspart (novoLOG) injection 0-9 Units  0-9 Units Subcutaneous TID WC Money, Lowry Ram, FNP   2 Units at 09/14/18 1140  . insulin glargine (LANTUS) injection 15 Units  15 Units Subcutaneous QHS Sharma Covert, MD   15 Units at 09/13/18 2058  . lamoTRIgine (LAMICTAL) tablet 175 mg  175 mg Oral QHS Sharma Covert, MD   175 mg at 09/13/18 2058  . levothyroxine (SYNTHROID) tablet 150 mcg  150 mcg Oral Q0600 Money, Lowry Ram, FNP   150 mcg at 09/14/18 [id number]  . loratadine (CLARITIN) tablet 10 mg  10 mg Oral Daily , Madie Reno, MD   10 mg at 09/14/18 0727  . LORazepam (ATIVAN) tablet 2 mg  2 mg Oral Q4H PRN , Madie Reno, MD   2 mg at 09/14/18 1140   Or  . LORazepam (ATIVAN) injection 2 mg  2 mg Intramuscular Q4H PRN , Madie Reno, MD   2 mg at 09/07/18 0732  . lurasidone (LATUDA) tablet 80 mg  80 mg Oral BID WC Sharma Covert, MD   80 mg at 09/14/18 0727  . magnesium hydroxide (MILK OF MAGNESIA) suspension 30 mL  30 mL Oral Daily PRN Money, Darnelle Maffucci B, FNP      . metFORMIN (GLUCOPHAGE) tablet 1,000 mg  1,000 mg Oral BID WC Sharma Covert, MD   1,000 mg at  09/14/18 0729  . moxifloxacin (VIGAMOX) 0.5 % ophthalmic solution 1 drop  1 drop Right Eye TID Money, Lowry Ram, FNP   1 drop at 09/14/18 1140  .  neomycin-bacitracin-polymyxin (NEOSPORIN) ointment   Topical BID Sharma Covert, MD   1 application at [id number] [phone]  . pantoprazole (PROTONIX) EC tablet 40 mg  40 mg Oral Daily Money, Lowry Ram, FNP   40 mg at 09/14/18 0729  . traZODone (DESYREL) tablet 100 mg  100 mg Oral QHS Money, Travis B, FNP   100 mg at 09/13/18 2058  . ziprasidone (GEODON) injection 20 mg  20 mg Intramuscular Q12H PRN , Madie Reno, MD   20 mg at 09/13/18 0755    Lab Results:  Results for orders placed or performed during the hospital encounter of 08/04/18 (from the past 48 hour(s))  Glucose, capillary     Status: Abnormal   Collection Time: 09/12/18  4:20 PM  Result Value Ref Range   Glucose-Capillary 116 (H) 70 - 99 mg/dL  Glucose, capillary     Status: Abnormal   Collection Time: 09/12/18  8:35 PM  Result Value Ref Range   Glucose-Capillary 117 (H) 70 - 99 mg/dL  Glucose, capillary     Status: Abnormal   Collection Time: 09/13/18  7:02 AM  Result Value Ref Range   Glucose-Capillary 202 (H) 70 - 99 mg/dL  Glucose, capillary     Status: Abnormal   Collection Time: 09/13/18 11:29 AM  Result Value Ref Range   Glucose-Capillary 136 (H) 70 - 99 mg/dL  Glucose, capillary     Status: Abnormal   Collection Time: 09/13/18  4:16 PM  Result Value Ref Range   Glucose-Capillary 120 (H) 70 - 99 mg/dL  Glucose, capillary     Status: Abnormal   Collection Time: 09/13/18  8:43 PM  Result Value Ref Range   Glucose-Capillary 116 (H) 70 - 99 mg/dL  Glucose, capillary     Status: Abnormal   Collection Time: 09/14/18  6:47 AM  Result Value Ref Range   Glucose-Capillary 139 (H) 70 - 99 mg/dL  Glucose, capillary     Status: Abnormal   Collection Time: 09/14/18 11:38 AM  Result Value Ref Range   Glucose-Capillary 182 (H) 70 - 99 mg/dL    Blood Alcohol level:  Lab Results   Component Value Date   ETH <10 07/19/2018   ETH <10 [id number]    Metabolic Disorder Labs: Lab Results  Component Value Date   HGBA1C 5.3 06/05/2018   MPG 105.41 06/05/2018   MPG 102.54 12/30/2017   No results found for: PROLACTIN Lab Results  Component Value Date   CHOL 155 06/05/2018   TRIG 93 06/05/2018   HDL 54 06/05/2018   CHOLHDL 2.9 06/05/2018   VLDL 19 06/05/2018   LDLCALC 82 06/05/2018   LDLCALC 90 12/30/2017    Physical Findings: AIMS: Facial and Oral Movements Muscles of Facial Expression: None, normal Lips and Perioral Area: None, normal Jaw: None, normal Tongue: None, normal,Extremity Movements Upper (arms, wrists, hands, fingers): None, normal Lower (legs, knees, ankles, toes): None, normal, Trunk Movements Neck, shoulders, hips: None, normal, Overall Severity Severity of abnormal movements (highest score from questions above): None, normal Incapacitation due to abnormal movements: None, normal Patient's awareness of abnormal movements (rate only patient's report): No Awareness, Dental Status Current problems with teeth and/or dentures?: Yes Does patient usually wear dentures?: No  CIWA:    COWS:     Musculoskeletal: Strength & Muscle Tone: within normal limits Gait & Station: normal Patient leans: N/A  Psychiatric Specialty Exam: Physical Exam  Nursing note and vitals reviewed. Constitutional: She appears well-developed and well-nourished.  HENT:  Head:  Normocephalic and atraumatic.  Eyes: Pupils are equal, round, and reactive to light. Conjunctivae are normal.  Neck: Normal range of motion.  Cardiovascular: Regular rhythm and normal heart sounds.  Respiratory: Effort normal. No respiratory distress.  GI: Soft.  Musculoskeletal: Normal range of motion.  Neurological: She is alert.  Skin: Skin is warm and dry.  Psychiatric: Her affect is blunt. Her speech is delayed. She is slowed. Thought content is not paranoid. Cognition and memory are  impaired. She expresses impulsivity. She expresses no homicidal and no suicidal ideation.    Review of Systems  Constitutional: Negative.   HENT: Negative.   Eyes: Negative.   Respiratory: Negative.   Cardiovascular: Negative.   Gastrointestinal: Negative.   Musculoskeletal: Negative.   Skin: Negative.   Neurological: Negative.   Psychiatric/Behavioral: Negative.     Blood pressure 123/84, pulse 71, temperature 98.2 F (36.8 C), temperature source Oral, resp. rate 16, height 4\' 10"  (1.473 m), weight 72.6 kg, SpO2 97 %.Body mass index is 33.44 kg/m.  General Appearance: Casual  Eye Contact:  Fair  Speech:  Slow  Volume:  Decreased  Mood:  Euthymic  Affect:  Constricted  Thought Process:  Goal Directed  Orientation:  Full (Time, Place, and Person)  Thought Content:  Tangential  Suicidal Thoughts:  No  Homicidal Thoughts:  No  Memory:  Immediate;   Fair Recent;   Fair Remote;   Fair  Judgement:  Fair  Insight:  Fair  Psychomotor Activity:  Normal  Concentration:  Concentration: Poor  Recall:  [employer] of Knowledge:  Fair  Language:  Fair  Akathisia:  No  Handed:  Right  AIMS (if indicated):     Assets:  Desire for Improvement Physical Health  ADL's:  Impaired  Cognition:  Impaired,  Mild  Sleep:  Number of Hours: 6.75     Treatment Plan Summary: Daily contact with patient to assess and evaluate symptoms and progress in treatment, Medication management and Plan No change to medicine which seems to be adequately tolerated.  Continue individual and group counseling focusing on containing behaviors and recognizing mood states.  Gave her a lot of praise at the progress she is making but she continues to be at high risk for explosive violence.  Alethia Berthold, MD 09/14/2018, 1:40 PM

## 2018-09-14 NOTE — Progress Notes (Signed)
D: Pt. During assessments denies si/hi/avh, able to contract for safety. Pt. Endorses a mostly normal mood, but also expresses anxieties. Pt. Engagement is fair and appropriate. Pt. Is mostly pleasant and cooperative.   A: Q x 15 minute observation checks in place for safety. Patient was provided with education.  Patient was given/offered medications per orders. Patient  was encourage to attend groups, participate in unit activities and continue with plan of care. Pt. Chart and plans of care reviewed. Pt. Given support and encouragement.   R: Patient is complaint with medications and unit procedures. Pt. Interactions with staff improved. Pt. Observed eating good. Pt. Blood sugars monitored per orders. Pt. Given PRN medications several times to manage anxiety endorsed and at other times visibly present.

## 2018-09-14 NOTE — Progress Notes (Signed)
Patient this morning endorsing an anxious mood, will give PRN medication for comfort and safety.

## 2018-09-14 NOTE — [facility] Group Notes (Signed)
LCSW Group Therapy Note  09/14/2018 1:00 PM  Type of Therapy/Topic:  Group Therapy:  Emotion Regulation  Participation Level:  None   Description of Group:   The purpose of this group is to assist patients in learning to regulate negative emotions and experience positive emotions. Patients will be guided to discuss ways in which they have been vulnerable to their negative emotions. These vulnerabilities will be juxtaposed with experiences of positive emotions or situations, and patients will be challenged to use positive emotions to combat negative ones. Special emphasis will be placed on coping with negative emotions in conflict situations, and patients will process healthy conflict resolution skills.  Therapeutic Goals: 1. Patient will identify two positive emotions or experiences to reflect on in order to balance out negative emotions 2. Patient will label two or more emotions that they find the most difficult to experience 3. Patient will demonstrate positive conflict resolution skills through discussion and/or role plays  Summary of Patient Progress: Patient was present for group, however did not participate in discussion. At one time it appeared that patient fell asleep washing her hands in the sink and needed to be redirected.  At a another instance it appeared that the patient fell asleep while in group, though she was quickly roused.    Therapeutic Modalities:   Cognitive Behavioral Therapy Feelings Identification Dialectical Behavioral Therapy  Assunta Curtis, MSW, LCSW 09/14/2018 12:40 PM

## 2018-09-14 NOTE — Plan of Care (Signed)
Patient ability to communicate how she is feeling observed to be improved, able to manage and show more self-control overall. Pt. Denies si/hi/avh, able to contract for safety. Pt. Participation observed improved, interactions more appropriate today.    Problem: Coping: Goal: Ability to demonstrate self-control will improve Outcome: Progressing   Problem: Safety: Goal: Periods of time without injury will increase Outcome: Progressing   Problem: Activity: Goal: Interest or engagement in leisure activities will improve Outcome: Progressing

## 2018-09-14 NOTE — Progress Notes (Addendum)
Patient endorsing anxiety, will give additional PRN medication for comfort and safety.
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
## 2018-09-15 LAB — GLUCOSE, CAPILLARY
Glucose-Capillary: 139 mg/dL — ABNORMAL HIGH (ref 70–99)
Glucose-Capillary: 182 mg/dL — ABNORMAL HIGH (ref 70–99)
Glucose-Capillary: 223 mg/dL — ABNORMAL HIGH (ref 70–99)
Glucose-Capillary: 125 mg/dL — ABNORMAL HIGH (ref 70–99)
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
## 2018-09-15 NOTE — BHH Group Notes (Signed)
LCSW Group Therapy Note  09/15/2018 12:28 PM  Type of Therapy/Topic:  Group Therapy:  Balance in Life  Participation Level:  Did Not Attend  Description of Group:    This group will address the concept of balance and how it feels and looks when one is unbalanced. Patients will be encouraged to process areas in their lives that are out of balance and identify reasons for remaining unbalanced. Facilitators will guide patients in utilizing problem-solving interventions to address and correct the stressor making their life unbalanced. Understanding and applying boundaries will be explored and addressed for obtaining and maintaining a balanced life. Patients will be encouraged to explore ways to assertively make their unbalanced needs known to significant others in their lives, using other group members and facilitator for support and feedback.  Therapeutic Goals: 1. Patient will identify two or more emotions or situations they have that consume much of in their lives. 2. Patient will identify signs/triggers that life has become out of balance:  3. Patient will identify two ways to set boundaries in order to achieve balance in their lives:  4. Patient will demonstrate ability to communicate their needs through discussion and/or role plays  Summary of Patient Progress: x     Therapeutic Modalities:   Cognitive Behavioral Therapy Solution-Focused Therapy Assertiveness Training  Evalina Field, MSW, LCSW Clinical Social Work 09/15/2018 12:28 PM

## 2018-09-15 NOTE — Progress Notes (Signed)
Recreation Therapy Notes  Date: 09/15/2018  Time: 9:30 am   Location: Craft room   Behavioral response: N/A   Intervention Topic: Happiness   Discussion/Intervention: Patient did not attend group.   Clinical Observations/Feedback:  Patient did not attend group.   Saivon Prowse LRT/CTRS         [place] Dowd 09/15/2018 10:45 AM

## 2018-09-15 NOTE — Plan of Care (Signed)
Patient is stable and coherently responding well to treatment regimen, poor participations with group activities, encouragement is provided , patient complied with her medications and no noticeable side effects noted , mood is good and affect is congruent with mood , patient request no PRNs , sleep is long and good , denies any SI/HI/AVH no signs of delusions , 15 minutes safety checks is maintained , and encouragement is provided.no distress..   Problem: Education: Goal: Knowledge of [place] [facility] Education information/materials will improve Outcome: Progressing   Problem: Coping: Goal: Ability to verbalize frustrations and anger appropriately will improve Outcome: Progressing Goal: Ability to demonstrate self-control will improve Outcome: Progressing   Problem: Safety: Goal: Periods of time without injury will increase Outcome: Progressing   Problem: Safety: Goal: Ability to redirect hostility and anger into socially appropriate behaviors will improve Outcome: Progressing   Problem: Activity: Goal: Interest or engagement in leisure activities will improve Outcome: Progressing Goal: Imbalance in normal sleep/wake cycle will improve Outcome: Progressing

## 2018-09-15 NOTE — BHH Counselor (Signed)
CSW sent medical notes to [facility] and spoke with Luci Bank reports the pt is at the top of the priority list and awaiting a bed to open.

## 2018-09-15 NOTE — Progress Notes (Signed)
Recreation Therapy Notes  Date: 09/15/2018  Time: 9:30 am   Location: Craft room   Behavioral response: N/A   Intervention Topic: Coping-Skills  Discussion/Intervention: Patient did not attend group.   Clinical Observations/Feedback:  Patient did not attend group.   Corinda Ammon LRT/CTRS         [place] Grayer 09/15/2018 10:45 AM
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
## 2018-09-15 NOTE — Plan of Care (Signed)
Pt rates depression and anxiety 10/10. Pt admits to SI, HI  and AVH with a negative nature. Pt says her plan is "to take all of her pills". She would not contract for safety. Pt was educated on care plan and verbalizes understanding. Will monitor patient and keep safe. Collier Bullock RN Problem: Education: Goal: Knowledge of  General Education information/materials will improve Outcome: Progressing   Problem: Activity: Goal: Interest or engagement in leisure activities will improve Outcome: Progressing Goal: Imbalance in normal sleep/wake cycle will improve Outcome: Progressing   Problem: Coping: Goal: Ability to verbalize frustrations and anger appropriately will improve Outcome: Not Progressing Goal: Ability to demonstrate self-control will improve Outcome: Not Progressing   Problem: Safety: Goal: Periods of time without injury will increase Outcome: Not Progressing   Problem: Safety: Goal: Ability to redirect hostility and anger into socially appropriate behaviors will improve Outcome: Not Progressing

## 2018-09-16 LAB — CBC WITH DIFFERENTIAL/PLATELET
Abs Immature Granulocytes: 0.02 10*3/uL (ref 0.00–0.07)
Basophils Absolute: 0.1 10*3/uL (ref 0.0–0.1)
Basophils Relative: 1 %
Eosinophils Absolute: 0.1 10*3/uL (ref 0.0–0.5)
Eosinophils Relative: 3 %
HCT: 37.2 % (ref 36.0–46.0)
Hemoglobin: 12.2 g/dL (ref 12.0–15.0)
Immature Granulocytes: 0 %
Lymphocytes Relative: 35 %
Lymphs Abs: 1.7 10*3/uL (ref 0.7–4.0)
MCH: 31.4 pg (ref 26.0–34.0)
MCHC: 32.8 g/dL (ref 30.0–36.0)
MCV: 95.6 fL (ref 80.0–100.0)
Monocytes Absolute: 0.6 10*3/uL (ref 0.1–1.0)
Monocytes Relative: 12 %
Neutro Abs: 2.4 10*3/uL (ref 1.7–7.7)
Neutrophils Relative %: 49 %
Platelets: 87 10*3/uL — ABNORMAL LOW (ref 150–400)
RBC: 3.89 MIL/uL (ref 3.87–5.11)
RDW: 12.9 % (ref 11.5–15.5)
Smear Review: DECREASED
WBC: 4.8 10*3/uL (ref 4.0–10.5)
nRBC: 0 % (ref 0.0–0.2)

## 2018-09-16 LAB — BASIC METABOLIC PANEL
Anion gap: 11 (ref 5–15)
BUN: 26 mg/dL — ABNORMAL HIGH (ref 6–20)
CO2: 30 mmol/L (ref 22–32)
Calcium: 8.8 mg/dL — ABNORMAL LOW (ref 8.9–10.3)
Chloride: 96 mmol/L — ABNORMAL LOW (ref 98–111)
Creatinine, Ser: 0.73 mg/dL (ref 0.44–1.00)
GFR calc Af Amer: >60 mL/min (ref >60)
GFR calc non Af Amer: >60 mL/min (ref >60)
Glucose, Bld: 115 mg/dL — ABNORMAL HIGH (ref 70–99)
Potassium: 4.5 mmol/L (ref 3.5–5.1)
Sodium: 137 mmol/L (ref 135–145)

## 2018-09-16 LAB — GLUCOSE, CAPILLARY
Glucose-Capillary: 181 mg/dL — ABNORMAL HIGH (ref 70–99)
Glucose-Capillary: 276 mg/dL — ABNORMAL HIGH (ref 70–99)
Glucose-Capillary: 135 mg/dL — ABNORMAL HIGH (ref 70–99)
Glucose-Capillary: 149 mg/dL — ABNORMAL HIGH (ref 70–99)

## 2018-09-16 LAB — SARS CORONAVIRUS 2 BY RT PCR (HOSPITAL ORDER, PERFORMED IN ~~LOC~~ HOSPITAL LAB): SARS Coronavirus 2: NEGATIVE

## 2018-09-16 MED ORDER — LAMOTRIGINE 25 MG PO TABS: 175.0000 mg | ORAL_TABLET | Freq: Every day | ORAL | 0 refills | Status: AC

## 2018-09-16 MED ORDER — DULOXETINE HCL 60 MG PO CPEP: 60.0000 mg | ORAL_CAPSULE | Freq: Every day | ORAL | 0 refills | Status: AC

## 2018-09-16 MED ORDER — GABAPENTIN 300 MG PO CAPS: 900.0000 mg | ORAL_CAPSULE | Freq: Three times a day (TID) | ORAL | 0 refills | Status: AC

## 2018-09-16 MED ORDER — LURASIDONE HCL 80 MG PO TABS: 80.0000 mg | ORAL_TABLET | Freq: Two times a day (BID) | ORAL | 0 refills | Status: AC

## 2018-09-16 MED ORDER — PANTOPRAZOLE SODIUM 40 MG PO TBEC: 40.0000 mg | DELAYED_RELEASE_TABLET | Freq: Every day | ORAL | 0 refills | Status: AC

## 2018-09-16 MED ORDER — METFORMIN HCL 1000 MG PO TABS: 1000.0000 mg | ORAL_TABLET | Freq: Two times a day (BID) | ORAL | 0 refills | Status: AC

## 2018-09-16 MED ORDER — LORATADINE 10 MG PO TABS: 10.0000 mg | ORAL_TABLET | Freq: Every day | ORAL | 0 refills | Status: AC

## 2018-09-16 MED ORDER — ATORVASTATIN CALCIUM 10 MG PO TABS: 10.0000 mg | ORAL_TABLET | Freq: Every day | ORAL | 0 refills | Status: AC

## 2018-09-16 MED ORDER — CARVEDILOL 3.125 MG PO TABS: 3.1250 mg | ORAL_TABLET | Freq: Two times a day (BID) | ORAL | 0 refills | Status: AC

## 2018-09-16 MED ORDER — TRAZODONE HCL 100 MG PO TABS: 100.0000 mg | ORAL_TABLET | Freq: Every day | ORAL | 0 refills | Status: AC

## 2018-09-16 MED ORDER — INSULIN ASPART 100 UNIT/ML ~~LOC~~ SOLN: 0.0000 [IU] | Freq: Every day | SUBCUTANEOUS | 11 refills | Status: AC

## 2018-09-16 MED ORDER — HYDROXYZINE HCL 50 MG PO TABS: 50.0000 mg | ORAL_TABLET | Freq: Four times a day (QID) | ORAL | 0 refills | Status: AC | PRN

## 2018-09-16 MED ORDER — INSULIN GLARGINE 100 UNIT/ML ~~LOC~~ SOLN: 15.0000 [IU] | Freq: Every day | SUBCUTANEOUS | 11 refills | Status: AC

## 2018-09-16 MED ORDER — FERROUS SULFATE 325 (65 FE) MG PO TABS: 325.0000 mg | ORAL_TABLET | Freq: Two times a day (BID) | ORAL | 0 refills | Status: AC

## 2018-09-16 MED ORDER — INSULIN ASPART 100 UNIT/ML ~~LOC~~ SOLN: 0.0000 [IU] | Freq: Three times a day (TID) | SUBCUTANEOUS | 11 refills | Status: AC

## 2018-09-16 MED ORDER — LEVOTHYROXINE SODIUM 150 MCG PO TABS: 150.0000 ug | ORAL_TABLET | Freq: Every day | ORAL | 0 refills | Status: AC

## 2018-09-16 MED ORDER — GUANFACINE HCL ER 2 MG PO TB24: 2.0000 mg | ORAL_TABLET | Freq: Every day | ORAL | 0 refills | Status: AC

## 2018-09-16 NOTE — BHH Suicide Risk Assessment (Signed)
[facility] Discharge Suicide Risk Assessment   Principal Problem: Schizoaffective disorder, bipolar type ([place]) Discharge Diagnoses: Principal Problem:   Schizoaffective disorder, bipolar type ([place]) Active Problems:   Diabetes mellitus without complication ([place])   Intellectual disability   Agitation   Borderline personality disorder ([facility])   HTN (hypertension)   Hypothyroidism   Bipolar disorder, unspecified ([place])   Total Time spent with patient: 30 minutes  Musculoskeletal: Strength & Muscle Tone: within normal limits Gait & Station: normal Patient leans: N/A  Psychiatric Specialty Exam: Review of Systems  Constitutional: Negative.   HENT: Negative.   Eyes: Negative.   Respiratory: Negative.   Cardiovascular: Negative.   Gastrointestinal: Negative.   Musculoskeletal: Negative.   Skin: Negative.   Neurological: Negative.   Psychiatric/Behavioral: Negative.     Blood pressure 132/65, pulse 74, temperature 98.5 F (36.9 C), temperature source Oral, resp. rate 18, height 4\' 10"  (1.473 m), weight 72.6 kg, SpO2 97 %.Body mass index is 33.44 kg/m.  General Appearance: Casual  Eye Contact::  Fair  Speech:  Slow409  Volume:  Decreased  Mood:  Anxious and Dysphoric  Affect:  Congruent  Thought Process:  Goal Directed  Orientation:  Full (Time, Place, and Person)  Thought Content:  Rumination  Suicidal Thoughts:  No  Homicidal Thoughts:  No  Memory:  Immediate;   Fair Recent;   Fair Remote;   Fair  Judgement:  Impaired  Insight:  Shallow  Psychomotor Activity:  Normal  Concentration:  Fair  Recall:  [employer] of Knowledge:Fair  Language: Fair  Akathisia:  No  Handed:  Right  AIMS (if indicated):     Assets:  Desire for Improvement Resilience  Sleep:  Number of Hours: 6  Cognition: Impaired,  Moderate  ADL's:  Intact   Mental Status Per Nursing Assessment::   On Admission:  NA  Demographic Factors:  Low socioeconomic status and Living alone  Loss Factors: Loss  of significant relationship  Historical Factors: Impulsivity  Risk Reduction Factors:   Positive therapeutic relationship  Continued Clinical Symptoms:  Depression:   Impulsivity Schizophrenia:   Paranoid or undifferentiated type  Cognitive Features That Contribute To Risk:  Thought constriction (tunnel vision)    Suicide Risk:  Mild:  Suicidal ideation of limited frequency, intensity, duration, and specificity.  There are no identifiable plans, no associated intent, mild dysphoria and related symptoms, good self-control (both objective and subjective assessment), few other risk factors, and identifiable protective factors, including available and accessible social support.    Plan Of Care/Follow-up recommendations:  Activity:  Activity as tolerated Diet:  Regular diet Other:  [facility]up [facility].  Discharge summary will be provided with list of medication.  Alethia Berthold, MD 09/16/2018, 4:28 PM

## 2018-09-16 NOTE — BHH Counselor (Addendum)
CSw spoke with Romani at [facility] to confirm that he received the paperwork for the patient. He reports that he has received all paperwork, however a new exclusion form needs to be completed. CSW pointed out that per Gwyndolyn Saxon this was not discussed at all and CSW team was asked to resend the paperwork that had already been signed.  Romani stated that that was a mistake and new paperwork is needed.    CSW asked if this would deny the patient from being able to be transferred on 8/29 and he stated that it would.  Patient will not be transferred as expected.  Assunta Curtis, MSW, LCSW 09/16/2018 4:48 PM
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
## 2018-09-16 NOTE — BHH Counselor (Signed)
CSW called [facility] back and spoke with Romani to confirm this from his supervisor that this will stop the patient from being transferred and was informed that patient would need a new Covid test, copy of discharge summary and Exception form for approval. They will hold bed until Monday.  Assunta Curtis, MSW, LCSW 09/19/2018 8:49 AM

## 2018-09-16 NOTE — Progress Notes (Signed)
D - Patient was in the dayroom upon arrival to the unit. Patient was pleasant during assessment. Patient denies SI/HI/AVH, pain, anxiety and depression with this Probation officer. Patient had no outbursts on the unit this evening. Patient is happy to be going to [place] next week.   A - Patient compliant with medication administration per MD orders and procedures on the unit. Patient given education. Patient given support and encouragement to be active in her treatment plan. Patient informed to let staff know if there are any issues or problems on the unit.   R - Patient being monitored Q 15 minutes for safety per unit protocol. Patient remains safe on the unit.

## 2018-09-16 NOTE — BHH Counselor (Signed)
CSW spoke with [place] who reports that he will attempt to hold the patients bed until Monday, however, exclusion doucment will need to be prioritized and sent on Monday 09/19/18.  He reports that patient will also need a new Covid test on Monday as well as a copy of her discharge summary sent over.  CSW expressed frustration at being told differing infomraiton from other staff and need to repeat processes that have already been completed.  CSW will inform CSW team.  Assunta Curtis, MSW, LCSW 09/16/2018 5:06 PM

## 2018-09-16 NOTE — Plan of Care (Signed)
Patient observed interacting appropriately with staff and peers this evening. Patient was in the day room being socially active this evening.   Problem: Activity: Goal: Interest or engagement in leisure activities will improve Outcome: Progressing
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
## 2018-09-16 NOTE — BHH Group Notes (Signed)
[place] [facility] Group Notes:  (Nursing/MHT/Case Management/Adjunct)  Date:  09/16/2018  Time:  9:29 PM  Type of Therapy:  Wrap Up Group  Participation Level:  Active  Participation Quality:  Appropriate, Attentive and Sharing  Affect:  Appropriate  Cognitive:  Alert and Appropriate  Insight:  Appropriate  Engagement in Group:  Engaged  Modes of Intervention:  Discussion and Support  Summary of Progress/Problems:  Adela Lank [facility] 09/16/2018, 9:29 PM

## 2018-09-16 NOTE — BHH Counselor (Signed)
CSW called [facility] at 3:48pm to confirm receipt of fax sent approximately 1 hour ago regarding the patient.  CSW asked if it has been reviewed and if patient was formally accepted so CSW could plan transportation for pick up on 09/17/2018.    CSW was informed that paperwork had not been reviewed.  CSW spoke with [place].  At the time of this update, no changes.  Assunta Curtis, MSW, LCSW 09/16/2018 4:06 PM

## 2018-09-16 NOTE — Progress Notes (Signed)
[facility] MD Progress Note  09/16/2018 4:23 PM TWANA KUBEK  MRN:  [id number] Subjective: Follow-up for this patient with schizoaffective disorder and developmental disability.  Treatment team learned today that the patient is likely to be accepted at [facility] tomorrow.  The patient was informed of this and is actually quite happy about it.  Appropriately so I think because as I told her she probably is going to be treated better there than she would be in any other setting outside of the hospital.  Additionally I think this will give her a chance to really work on some behavior changes.  Patient has had a relatively good day not losing her temper too badly today.  Affect this afternoon was calm.  Not reporting psychotic symptoms.  Blood sugars are still been a little bit up and down mostly in the 100s. Principal Problem: Schizoaffective disorder, bipolar type ([place]) Diagnosis: Principal Problem:   Schizoaffective disorder, bipolar type ([place]) Active Problems:   Diabetes mellitus without complication ([place])   Intellectual disability   Agitation   Borderline personality disorder (HCC)   HTN (hypertension)   Hypothyroidism   Bipolar disorder, unspecified ([place])  Total Time spent with patient: 30 minutes  Past Psychiatric History: Long history of behavior problems with worsening dependency on treatment after the death of her mother  Past Medical History:  Past Medical History:  Diagnosis Date  . Anxiety   . Depression   . Diabetes mellitus without complication ([place])   . GERD (gastroesophageal reflux disease)   . Homicidal ideation   . Hypertension   . Hypothyroidism     Past Surgical History:  Procedure Laterality Date  . COLPOSCOPY    . FRACTURE SURGERY    . TRACHEOSTOMY     Family History: History reviewed. No pertinent family history. Family Psychiatric  History: See previous Social History:  Social History   Substance and Sexual Activity  Alcohol Use No  .  Frequency: Never     Social History   Substance and Sexual Activity  Drug Use Yes  . Types: Marijuana, "Crack" cocaine   Comment: reports she has not done anything in a long time    Social History   Socioeconomic History  . Marital status: Single    Spouse name: Not on file  . Number of children: Not on file  . Years of education: Not on file  . Highest education level: Not on file  Occupational History  . Not on file  Social Needs  . Financial resource strain: Somewhat hard  . Food insecurity    Worry: Patient refused    Inability: Patient refused  . Transportation needs    Medical: Patient refused    Non-medical: Patient refused  Tobacco Use  . Smoking status: Former Research scientist (life sciences)  . Smokeless tobacco: Never Used  Substance and Sexual Activity  . Alcohol use: No    Frequency: Never  . Drug use: Yes    Types: Marijuana, "Crack" cocaine    Comment: reports she has not done anything in a long time  . Sexual activity: Not Currently    Birth control/protection: Abstinence  Lifestyle  . Physical activity    Days per week: Patient refused    Minutes per session: Patient refused  . Stress: Rather much  Relationships  . Social connections    Talks on phone: Patient refused    Gets together: Patient refused    Attends religious service: Patient refused    Active member of club or  organization: Patient refused    Attends meetings of clubs or organizations: Patient refused    Relationship status: Patient refused  Other Topics Concern  . Not on file  Social History Narrative  . Not on file   Additional Social History:                         Sleep: Fair  Appetite:  Fair  Current Medications: Current Facility-Administered Medications  Medication Dose Route Frequency Provider Last Rate Last Dose  . acetaminophen (TYLENOL) tablet 650 mg  650 mg Oral Q6H PRN Money, Lowry Ram, FNP   650 mg at 09/02/18 [id number]  . alum & mag hydroxide-simeth (MAALOX/MYLANTA) 200-200-20  MG/5ML suspension 30 mL  30 mL Oral Q4H PRN Money, Lowry Ram, FNP      . atorvastatin (LIPITOR) tablet 10 mg  10 mg Oral q1800 Money, Lowry Ram, FNP   10 mg at 09/15/18 1659  . carvedilol (COREG) tablet 3.125 mg  3.125 mg Oral BID WC Sharma Covert, MD   3.125 mg at 09/16/18 1144  . DULoxetine (CYMBALTA) DR capsule 60 mg  60 mg Oral Daily , Madie Reno, MD   60 mg at 09/16/18 1142  . ferrous sulfate tablet 325 mg  325 mg Oral BID WC Money, Lowry Ram, FNP   325 mg at 09/16/18 1143  . gabapentin (NEURONTIN) capsule 900 mg  900 mg Oral TID , Madie Reno, MD   900 mg at 09/16/18 1141  . guanFACINE (INTUNIV) ER tablet 2 mg  2 mg Oral Daily ,  T, MD   2 mg at 09/16/18 1144  . hydrOXYzine (ATARAX/VISTARIL) tablet 50 mg  50 mg Oral Q6H PRN , Madie Reno, MD   50 mg at 09/10/18 1131  . insulin aspart (novoLOG) injection 0-5 Units  0-5 Units Subcutaneous QHS Money, Lowry Ram, FNP   Stopped at 08/31/18 2144  . insulin aspart (novoLOG) injection 0-9 Units  0-9 Units Subcutaneous TID WC Money, Lowry Ram, FNP   1 Units at 09/16/18 1620  . insulin glargine (LANTUS) injection 15 Units  15 Units Subcutaneous QHS Sharma Covert, MD   15 Units at 09/15/18 2120  . lamoTRIgine (LAMICTAL) tablet 175 mg  175 mg Oral QHS Sharma Covert, MD   175 mg at 09/15/18 2121  . levothyroxine (SYNTHROID) tablet 150 mcg  150 mcg Oral Q0600 Money, Lowry Ram, FNP   150 mcg at 09/16/18 [id number]  . loratadine (CLARITIN) tablet 10 mg  10 mg Oral Daily , Madie Reno, MD   10 mg at 09/16/18 1143  . LORazepam (ATIVAN) tablet 2 mg  2 mg Oral Q4H PRN , Madie Reno, MD   2 mg at 09/14/18 1140   Or  . LORazepam (ATIVAN) injection 2 mg  2 mg Intramuscular Q4H PRN , Madie Reno, MD   2 mg at 09/07/18 0732  . lurasidone (LATUDA) tablet 80 mg  80 mg Oral BID WC Sharma Covert, MD   80 mg at 09/16/18 1145  . magnesium hydroxide (MILK OF MAGNESIA) suspension 30 mL  30 mL Oral Daily PRN Money, Darnelle Maffucci B, FNP      .  metFORMIN (GLUCOPHAGE) tablet 1,000 mg  1,000 mg Oral BID WC Sharma Covert, MD   1,000 mg at 09/16/18 1142  . moxifloxacin (VIGAMOX) 0.5 % ophthalmic solution 1 drop  1 drop Right Eye TID Money, Lowry Ram, FNP   1 drop at 09/16/18 1141  .  neomycin-bacitracin-polymyxin (NEOSPORIN) ointment   Topical BID Sharma Covert, MD   1 application at [id number]  . pantoprazole (PROTONIX) EC tablet 40 mg  40 mg Oral Daily Money, Lowry Ram, FNP   40 mg at 09/16/18 1142  . traZODone (DESYREL) tablet 100 mg  100 mg Oral QHS Money, Travis B, FNP   100 mg at 09/15/18 2122  . ziprasidone (GEODON) injection 20 mg  20 mg Intramuscular Q12H PRN , Madie Reno, MD   20 mg at 09/13/18 [id number]    Lab Results:  Results for orders placed or performed during the hospital encounter of 08/04/18 (from the past 48 hour(s))  Glucose, capillary     Status: Abnormal   Collection Time: 09/14/18  8:39 PM  Result Value Ref Range   Glucose-Capillary 141 (H) 70 - 99 mg/dL   Comment 1 Notify RN   Glucose, capillary     Status: Abnormal   Collection Time: 09/15/18  7:45 AM  Result Value Ref Range   Glucose-Capillary 139 (H) 70 - 99 mg/dL  Glucose, capillary     Status: Abnormal   Collection Time: 09/15/18 11:40 AM  Result Value Ref Range   Glucose-Capillary 182 (H) 70 - 99 mg/dL  Glucose, capillary     Status: Abnormal   Collection Time: 09/15/18  4:14 PM  Result Value Ref Range   Glucose-Capillary 223 (H) 70 - 99 mg/dL  Glucose, capillary     Status: Abnormal   Collection Time: 09/15/18  8:07 PM  Result Value Ref Range   Glucose-Capillary 125 (H) 70 - 99 mg/dL   Comment 1 Document in Chart   Glucose, capillary     Status: Abnormal   Collection Time: 09/16/18  7:01 AM  Result Value Ref Range   Glucose-Capillary 181 (H) 70 - 99 mg/dL   Comment 1 Notify RN   Glucose, capillary     Status: Abnormal   Collection Time: 09/16/18 11:33 AM  Result Value Ref Range   Glucose-Capillary 276 (H) 70 - 99 mg/dL  SARS  Coronavirus 2 [facility] order, Performed in [facility] hospital lab) Nasopharyngeal Nasopharyngeal Swab     Status: None   Collection Time: 09/16/18 12:12 PM   Specimen: Nasopharyngeal Swab  Result Value Ref Range   SARS Coronavirus 2 NEGATIVE NEGATIVE    Comment: (NOTE) If result is NEGATIVE SARS-CoV-2 target nucleic acids are NOT DETECTED. The SARS-CoV-2 RNA is generally detectable in upper and lower  respiratory specimens during the acute phase of infection. The lowest  concentration of SARS-CoV-2 viral copies this assay can detect is 250  copies / mL. A negative result does not preclude SARS-CoV-2 infection  and should not be used as the sole basis for treatment or other  patient management decisions.  A negative result may occur with  improper specimen collection / handling, submission of specimen other  than nasopharyngeal swab, presence of viral mutation(s) within the  areas targeted by this assay, and inadequate number of viral copies  (<250 copies / mL). A negative result must be combined with clinical  observations, patient history, and epidemiological information. If result is POSITIVE SARS-CoV-2 target nucleic acids are DETECTED. The SARS-CoV-2 RNA is generally detectable in upper and lower  respiratory specimens dur ing the acute phase of infection.  Positive  results are indicative of active infection with SARS-CoV-2.  Clinical  correlation with patient history and other diagnostic information is  necessary to determine patient infection status.  Positive results do  not rule  out bacterial infection or co-infection with other viruses. If result is PRESUMPTIVE POSTIVE SARS-CoV-2 nucleic acids MAY BE PRESENT.   A presumptive positive result was obtained on the submitted specimen  and confirmed on repeat testing.  While 2019 novel coronavirus  (SARS-CoV-2) nucleic acids may be present in the submitted sample  additional confirmatory testing may be necessary for  epidemiological  and / or clinical management purposes  to differentiate between  SARS-CoV-2 and other Sarbecovirus currently known to infect humans.  If clinically indicated additional testing with an alternate test  methodology [phone]) is advised. The SARS-CoV-2 RNA is generally  detectable in upper and lower respiratory sp ecimens during the acute  phase of infection. The expected result is Negative. Fact Sheet for Patients:  StrictlyIdeas.no Fact Sheet for Healthcare Providers: BankingDealers.co.za This test is not yet approved or cleared by the [place] FDA and has been authorized for detection and/or diagnosis of SARS-CoV-2 by FDA under an Emergency Use Authorization (EUA).  This EUA will remain in effect (meaning this test can be used) for the duration of the COVID-19 declaration under Section 564(b)(1) of the Act, 21 U.S.C. section 360bbb-3(b)(1), unless the authorization is terminated or revoked sooner. Performed at [facility], [address] [place], [place] [id number]   Basic metabolic panel     Status: Abnormal   Collection Time: 09/16/18  2:23 PM  Result Value Ref Range   Sodium 137 135 - 145 mmol/L   Potassium 4.5 3.5 - 5.1 mmol/L   Chloride 96 (L) 98 - 111 mmol/L   CO2 30 22 - 32 mmol/L   Glucose, Bld 115 (H) 70 - 99 mg/dL   BUN 26 (H) 6 - 20 mg/dL   Creatinine, Ser 0.73 0.44 - 1.00 mg/dL   Calcium 8.8 (L) 8.9 - 10.3 mg/dL   GFR calc non Af Amer >60 >60 mL/min   GFR calc Af Amer >60 >60 mL/min   Anion gap 11 5 - 15    Comment: Performed at [facility], [address] [place], [place] [address]  CBC with Differential/Platelet     Status: Abnormal   Collection Time: 09/16/18  2:23 PM  Result Value Ref Range   WBC 4.8 4.0 - 10.5 K/uL   RBC 3.89 3.87 - 5.11 MIL/uL   Hemoglobin 12.2 12.0 - 15.0 g/dL   HCT 37.2 36.0 - 46.0 %   MCV 95.6 80.0 - 100.0 fL   MCH 31.4 26.0 - 34.0 pg   MCHC  32.8 30.0 - 36.0 g/dL   RDW 12.9 11.5 - 15.5 %   Platelets 87 (L) 150 - 400 K/uL    Comment: Immature Platelet Fraction may be clinically indicated, consider ordering this additional test [id number]    nRBC 0.0 0.0 - 0.2 %   Neutrophils Relative % 49 %   Neutro Abs 2.4 1.7 - 7.7 K/uL   Lymphocytes Relative 35 %   Lymphs Abs 1.7 0.7 - 4.0 K/uL   Monocytes Relative 12 %   Monocytes Absolute 0.6 0.1 - 1.0 K/uL   Eosinophils Relative 3 %   Eosinophils Absolute 0.1 0.0 - 0.5 K/uL   Basophils Relative 1 %   Basophils Absolute 0.1 0.0 - 0.1 K/uL   Smear Review PLATELETS APPEAR DECREASED    Immature Granulocytes 0 %   Abs Immature Granulocytes 0.02 0.00 - 0.07 K/uL    Comment: Performed at [facility], [address] [place], [place] [address]  Glucose, capillary  Status: Abnormal   Collection Time: 09/16/18  4:15 PM  Result Value Ref Range   Glucose-Capillary 135 (H) 70 - 99 mg/dL    Blood Alcohol level:  Lab Results  Component Value Date   ETH <10 07/19/2018   ETH <10 [id number]    Metabolic Disorder Labs: Lab Results  Component Value Date   HGBA1C 5.3 06/05/2018   MPG 105.41 06/05/2018   MPG 102.54 12/30/2017   No results found for: PROLACTIN Lab Results  Component Value Date   CHOL 155 06/05/2018   TRIG 93 06/05/2018   HDL 54 06/05/2018   CHOLHDL 2.9 06/05/2018   VLDL 19 06/05/2018   LDLCALC 82 06/05/2018   LDLCALC 90 12/30/2017    Physical Findings: AIMS: Facial and Oral Movements Muscles of Facial Expression: None, normal Lips and Perioral Area: None, normal Jaw: None, normal Tongue: None, normal,Extremity Movements Upper (arms, wrists, hands, fingers): None, normal Lower (legs, knees, ankles, toes): None, normal, Trunk Movements Neck, shoulders, hips: None, normal, Overall Severity Severity of abnormal movements (highest score from questions above): None, normal Incapacitation due to abnormal movements: None, normal Patient's awareness of  abnormal movements (rate only patient's report): No Awareness, Dental Status Current problems with teeth and/or dentures?: Yes Does patient usually wear dentures?: No  CIWA:    COWS:     Musculoskeletal: Strength & Muscle Tone: within normal limits Gait & Station: normal Patient leans: N/A  Psychiatric Specialty Exam: Physical Exam  Nursing note and vitals reviewed. Constitutional: She appears well-developed and well-nourished.  HENT:  Head: Normocephalic and atraumatic.  Eyes: Pupils are equal, round, and reactive to light. Conjunctivae are normal.  Neck: Normal range of motion.  Cardiovascular: Regular rhythm and normal heart sounds.  Respiratory: Effort normal. No respiratory distress.  GI: Soft.  Musculoskeletal: Normal range of motion.  Neurological: She is alert.  Skin: Skin is warm and dry.  Psychiatric: She has a normal mood and affect. Her speech is normal and behavior is normal. Judgment and thought content normal. Cognition and memory are normal.    Review of Systems  Constitutional: Negative.   HENT: Negative.   Eyes: Negative.   Respiratory: Negative.   Cardiovascular: Negative.   Gastrointestinal: Negative.   Musculoskeletal: Negative.   Skin: Negative.   Neurological: Negative.   Psychiatric/Behavioral: Negative.     Blood pressure 132/65, pulse 74, temperature 98.5 F (36.9 C), temperature source Oral, resp. rate 18, height 4\' 10"  (1.473 m), weight 72.6 kg, SpO2 97 %.Body mass index is 33.44 kg/m.  General Appearance: Casual  Eye Contact:  Fair  Speech:  Clear and Coherent  Volume:  Decreased  Mood:  Anxious and Dysphoric  Affect:  Congruent  Thought Process:  Coherent  Orientation:  Full (Time, Place, and Person)  Thought Content:  Logical  Suicidal Thoughts:  No  Homicidal Thoughts:  No  Memory:  Immediate;   Fair Recent;   Fair Remote;   Fair  Judgement:  Impaired  Insight:  Shallow  Psychomotor Activity:  Normal  Concentration:   Concentration: Fair  Recall:  [employer] of Knowledge:  Fair  Language:  Fair  Akathisia:  No  Handed:  Right  AIMS (if indicated):     Assets:  Desire for Improvement Resilience  ADL's:  Impaired  Cognition:  Impaired,  Mild and Moderate  Sleep:  Number of Hours: 6     Treatment Plan Summary: Daily contact with patient to assess and evaluate symptoms and progress in treatment, Medication management  and Plan No change to medicine.  Supportive counseling with the patient.  Encour[facility] to be [facility]  We had to get another coronavirus test on the patient today which should be done.  We had to get some more basic labs on her at their request.  Filed commitment paperwork to facilitate the ease of transfer.  I will make sure everything is lined up so that she would be ready to go tomorrow if everything is good to go.  Alethia Berthold, MD 09/16/2018, 4:23 PM

## 2018-09-16 NOTE — BHH Counselor (Signed)
CSW spoke with Gwyndolyn Saxon at [facility] who reports before the pt can be offered a bed they will need copy of IVC paper and negative Covid test. This information has been relayed to the physician and nursing staff.

## 2018-09-16 NOTE — Progress Notes (Signed)
Patient is and has been anticipating discharge , when she keep asking for when am I going to be discharged and at the same time maintaining safety and doing her ADLs , patient is taking her medicines with out any noticeable side effects , patient  Denies any SI/HI/ AVH did not ask for any PRNs only requiring 15 minutes safety checks.

## 2018-09-16 NOTE — Progress Notes (Signed)
Recreation Therapy Notes   Date: 09/16/2018  Time: 1:00 pm  Location: Craft room  Behavioral response: Appropriate  Group Type: Leisure  Participation level: Active  Communication: Patient was social with peers and staff.  Comments: N/A  Zilla Shartzer LRT/CTRS        Salinda Snedeker 09/16/2018 2:01 PM

## 2018-09-16 NOTE — Progress Notes (Signed)
Pt has been cooperative since she came out of her room at 1130. She took her meds and has been socializing. Pt is looking forward to the fact that she might have found placement today. Collier Bullock RN

## 2018-09-16 NOTE — Progress Notes (Signed)
Recreation Therapy Notes  Date: 09/16/2018  Time: 9:30 am   Location: Craft room   Behavioral response: N/A   Intervention Topic: Emotions    Discussion/Intervention: Patient did not attend group.   Clinical Observations/Feedback:  Patient did not attend group.   Eugen Jeansonne LRT/CTRS          Carita Sollars 09/16/2018 10:56 AM

## 2018-09-17 LAB — GLUCOSE, CAPILLARY
Glucose-Capillary: 180 mg/dL — ABNORMAL HIGH (ref 70–99)
Glucose-Capillary: 254 mg/dL — ABNORMAL HIGH (ref 70–99)
Glucose-Capillary: 145 mg/dL — ABNORMAL HIGH (ref 70–99)
Glucose-Capillary: 133 mg/dL — ABNORMAL HIGH (ref 70–99)

## 2018-09-17 NOTE — Plan of Care (Signed)
Patient observed interacting appropriately with staff and peers this evening. Patient was active on the unit.   Problem: Safety: Goal: Ability to redirect hostility and anger into socially appropriate behaviors will improve Outcome: Progressing   Problem: Activity: Goal: Interest or engagement in leisure activities will improve Outcome: Progressing

## 2018-09-17 NOTE — B[facility] Group Notes (Signed)
Type of Therapy and Topic:  Group Therapy:  Trust and Honesty 09/17/2018  Participation Level:  Active - Off topic   Description of Group:     In this group patients will be asked to explore the value of being honest.  Patients will be guided to discuss their thoughts, feelings, and behaviors related to honesty and trusting in others. Patients will process together how trust and honesty relate to forming relationships with peers, family members, and self. Each patient will be challenged to identify and express feelings of being vulnerable. Patients will discuss reasons why people are dishonest and identify alternative outcomes if one was truthful (to self or others). This group will be process-oriented, with patients participating in exploration of their own experiences, giving and receiving support, and processing challenge from other group members.    Therapeutic Goals:  1.  Patient will identify why honesty is important to relationships and how honesty overall affects relationships.  2.  Patient will identify a situation where they lied or were lied too and the  feelings, thought process, and behaviors surrounding the situation  3.  Patient will identify the meaning of being vulnerable, how that feels, and how that correlates to being honest with self and others.  4.  Patient will identify situations where they could have told the truth, but instead lied and explain reasons of dishonesty.     Summary of Patient Progress: Jessica [name] remained present throughout most of group and excused herself toward the end. She was an active participant, though off topic at times and shared a lengthy story about overdosing and being in a coma. She shared that in order for her to trust someone, she has to know that their conversation will be kept confidential.   Therapeutic Modalities:   Cognitive Behavioral Therapy Solution Focused Therapy Motivational Interviewing Brief Therapy

## 2018-09-17 NOTE — Progress Notes (Signed)
D - Patient was in the dayroom upon arrival to the unit. Patient was pleasant during assessment. Patient denies SI/HI/AVH, pain, anxiety and depression with this Probation officer. Patient had no outbursts on the unit this evening.Patient seen interacting appropriately with staff and peers this evening.   A - Patient compliant with medication administration per MD orders and procedures on the unit. Patient given education. Patient given support and encouragement to be active in her treatment plan. Patient informed to let staff know if there are any issues or problems on the unit.   R - Patient being monitored Q 15 minutes for safety per unit protocol. Patient remains safe on the unit.

## 2018-09-17 NOTE — Progress Notes (Signed)
[facility] MD Progress Note  09/17/2018 8:36 AM Jessica [name]  MRN:  [id number] Subjective:   Patient has had a protracted hospital stay, she is known to have a bipolar type condition complicated by intellectual disability and a chronic personality disorder as well.  She states that she is ready to go home tomorrow, she believes she has placement arranged.  She is calm and cooperative she displays no dangerous behaviors at this point in time.  She denies auditory or visual hallucination she is oriented to general situation not exact date. No involuntary movements.  Basic risk-benefit side effects of meds discussed Principal Problem: Schizoaffective disorder, bipolar type ([place]) Diagnosis: Principal Problem:   Schizoaffective disorder, bipolar type ([place]) Active Problems:   Diabetes mellitus without complication ([place])   Intellectual disability   Agitation   Borderline personality disorder (HCC)   HTN (hypertension)   Hypothyroidism   Bipolar disorder, unspecified ([place])  Total Time spent with patient: 20 minutes  Past Psychiatric History: Extensive with multiple admissions  Past Medical History:  Past Medical History:  Diagnosis Date  . Anxiety   . Depression   . Diabetes mellitus without complication ([place])   . GERD (gastroesophageal reflux disease)   . Homicidal ideation   . Hypertension   . Hypothyroidism     Past Surgical History:  Procedure Laterality Date  . COLPOSCOPY    . FRACTURE SURGERY    . TRACHEOSTOMY     Family History: History reviewed. No pertinent family history. Family Psychiatric  History: no new data Social History:  Social History   Substance and Sexual Activity  Alcohol Use No  . Frequency: Never     Social History   Substance and Sexual Activity  Drug Use Yes  . Types: Marijuana, "Crack" cocaine   Comment: reports she has not done anything in a long time    Social History   Socioeconomic History  . Marital status: Single    Spouse name: Not on  file  . Number of children: Not on file  . Years of education: Not on file  . Highest education level: Not on file  Occupational History  . Not on file  Social Needs  . Financial resource strain: Somewhat hard  . Food insecurity    Worry: Patient refused    Inability: Patient refused  . Transportation needs    Medical: Patient refused    Non-medical: Patient refused  Tobacco Use  . Smoking status: Former Research scientist (life sciences)  . Smokeless tobacco: Never Used  Substance and Sexual Activity  . Alcohol use: No    Frequency: Never  . Drug use: Yes    Types: Marijuana, "Crack" cocaine    Comment: reports she has not done anything in a long time  . Sexual activity: Not Currently    Birth control/protection: Abstinence  Lifestyle  . Physical activity    Days per week: Patient refused    Minutes per session: Patient refused  . Stress: Rather much  Relationships  . Social Herbalist on phone: Patient refused    Gets together: Patient refused    Attends religious service: Patient refused    Active member of club or organization: Patient refused    Attends meetings of clubs or organizations: Patient refused    Relationship status: Patient refused  Other Topics Concern  . Not on file  Social History Narrative  . Not on file   Additional Social History:  Sleep: Fair  Appetite:  Fair  Current Medications: Current Facility-Administered Medications  Medication Dose Route Frequency Provider Last Rate Last Dose  . acetaminophen (TYLENOL) tablet 650 mg  650 mg Oral Q6H PRN Money, Lowry Ram, FNP   650 mg at 09/02/18 [id number]  . alum & mag hydroxide-simeth (MAALOX/MYLANTA) 200-200-20 MG/5ML suspension 30 mL  30 mL Oral Q4H PRN Money, Lowry Ram, FNP      . atorvastatin (LIPITOR) tablet 10 mg  10 mg Oral q1800 Money, Lowry Ram, FNP   10 mg at 09/16/18 1757  . carvedilol (COREG) tablet 3.125 mg  3.125 mg Oral BID WC Sharma Covert, MD   Stopped at 09/17/18  [phone]  . DULoxetine (CYMBALTA) DR capsule 60 mg  60 mg Oral Daily Clapacs, Madie Reno, MD   60 mg at 09/17/18 0726  . ferrous sulfate tablet 325 mg  325 mg Oral BID WC Money, Lowry Ram, FNP   325 mg at 09/17/18 0726  . gabapentin (NEURONTIN) capsule 900 mg  900 mg Oral TID Clapacs, Madie Reno, MD   900 mg at 09/17/18 0726  . guanFACINE (INTUNIV) ER tablet 2 mg  2 mg Oral Daily Clapacs, John T, MD   2 mg at 09/17/18 0727  . hydrOXYzine (ATARAX/VISTARIL) tablet 50 mg  50 mg Oral Q6H PRN Clapacs, Madie Reno, MD   50 mg at 09/10/18 1131  . insulin aspart (novoLOG) injection 0-5 Units  0-5 Units Subcutaneous QHS Money, Lowry Ram, FNP   Stopped at 08/31/18 2144  . insulin aspart (novoLOG) injection 0-9 Units  0-9 Units Subcutaneous TID WC Money, Lowry Ram, FNP   2 Units at 09/17/18 0725  . insulin glargine (LANTUS) injection 15 Units  15 Units Subcutaneous QHS Sharma Covert, MD   15 Units at 09/16/18 2124  . lamoTRIgine (LAMICTAL) tablet 175 mg  175 mg Oral QHS Sharma Covert, MD   175 mg at 09/16/18 2125  . levothyroxine (SYNTHROID) tablet 150 mcg  150 mcg Oral Q0600 Money, Lowry Ram, FNP   150 mcg at 09/17/18 [id number]  . loratadine (CLARITIN) tablet 10 mg  10 mg Oral Daily Clapacs, Madie Reno, MD   10 mg at 09/17/18 0727  . LORazepam (ATIVAN) tablet 2 mg  2 mg Oral Q4H PRN Clapacs, Madie Reno, MD   2 mg at 09/14/18 1140   Or  . LORazepam (ATIVAN) injection 2 mg  2 mg Intramuscular Q4H PRN Clapacs, Madie Reno, MD   2 mg at 09/07/18 0732  . lurasidone (LATUDA) tablet 80 mg  80 mg Oral BID WC Sharma Covert, MD   80 mg at 09/17/18 0727  . magnesium hydroxide (MILK OF MAGNESIA) suspension 30 mL  30 mL Oral Daily PRN Money, Darnelle Maffucci B, FNP      . metFORMIN (GLUCOPHAGE) tablet 1,000 mg  1,000 mg Oral BID WC Sharma Covert, MD   1,000 mg at 09/17/18 0726  . moxifloxacin (VIGAMOX) 0.5 % ophthalmic solution 1 drop  1 drop Right Eye TID Money, Lowry Ram, FNP   1 drop at 09/17/18 0727  . neomycin-bacitracin-polymyxin (NEOSPORIN)  ointment   Topical BID Sharma Covert, MD   1 application at [id number]  . pantoprazole (PROTONIX) EC tablet 40 mg  40 mg Oral Daily Money, Lowry Ram, FNP   40 mg at 09/17/18 0726  . traZODone (DESYREL) tablet 100 mg  100 mg Oral QHS Money, Travis B, FNP   100 mg at 09/16/18 2125  . ziprasidone (  GEODON) injection 20 mg  20 mg Intramuscular Q12H PRN Clapacs, Madie Reno, MD   20 mg at 09/13/18 [id number]    Lab Results:  Results for orders placed or performed during the hospital encounter of 08/04/18 (from the past 48 hour(s))  Glucose, capillary     Status: Abnormal   Collection Time: 09/15/18 11:40 AM  Result Value Ref Range   Glucose-Capillary 182 (H) 70 - 99 mg/dL  Glucose, capillary     Status: Abnormal   Collection Time: 09/15/18  4:14 PM  Result Value Ref Range   Glucose-Capillary 223 (H) 70 - 99 mg/dL  Glucose, capillary     Status: Abnormal   Collection Time: 09/15/18  8:07 PM  Result Value Ref Range   Glucose-Capillary 125 (H) 70 - 99 mg/dL   Comment 1 Document in Chart   Glucose, capillary     Status: Abnormal   Collection Time: 09/16/18  7:01 AM  Result Value Ref Range   Glucose-Capillary 181 (H) 70 - 99 mg/dL   Comment 1 Notify RN   Glucose, capillary     Status: Abnormal   Collection Time: 09/16/18 11:33 AM  Result Value Ref Range   Glucose-Capillary 276 (H) 70 - 99 mg/dL  SARS Coronavirus 2 [facility] order, Performed in [facility] hospital lab) Nasopharyngeal Nasopharyngeal Swab     Status: None   Collection Time: 09/16/18 12:12 PM   Specimen: Nasopharyngeal Swab  Result Value Ref Range   SARS Coronavirus 2 NEGATIVE NEGATIVE    Comment: (NOTE) If result is NEGATIVE SARS-CoV-2 target nucleic acids are NOT DETECTED. The SARS-CoV-2 RNA is generally detectable in upper and lower  respiratory specimens during the acute phase of infection. The lowest  concentration of SARS-CoV-2 viral copies this assay can detect is 250  copies / mL. A negative result does not preclude  SARS-CoV-2 infection  and should not be used as the sole basis for treatment or other  patient management decisions.  A negative result may occur with  improper specimen collection / handling, submission of specimen other  than nasopharyngeal swab, presence of viral mutation(s) within the  areas targeted by this assay, and inadequate number of viral copies  (<250 copies / mL). A negative result must be combined with clinical  observations, patient history, and epidemiological information. If result is POSITIVE SARS-CoV-2 target nucleic acids are DETECTED. The SARS-CoV-2 RNA is generally detectable in upper and lower  respiratory specimens dur ing the acute phase of infection.  Positive  results are indicative of active infection with SARS-CoV-2.  Clinical  correlation with patient history and other diagnostic information is  necessary to determine patient infection status.  Positive results do  not rule out bacterial infection or co-infection with other viruses. If result is PRESUMPTIVE POSTIVE SARS-CoV-2 nucleic acids MAY BE PRESENT.   A presumptive positive result was obtained on the submitted specimen  and confirmed on repeat testing.  While 2019 novel coronavirus  (SARS-CoV-2) nucleic acids may be present in the submitted sample  additional confirmatory testing may be necessary for epidemiological  and / or clinical management purposes  to differentiate between  SARS-CoV-2 and other Sarbecovirus currently known to infect humans.  If clinically indicated additional testing with an alternate test  methodology [phone]) is advised. The SARS-CoV-2 RNA is generally  detectable in upper and lower respiratory sp ecimens during the acute  phase of infection. The expected result is Negative. Fact Sheet for Patients:  StrictlyIdeas.no Fact Sheet for Healthcare Providers: BankingDealers.co.za This  test is not yet approved or cleared by the  [place] and has been authorized for detection and/or diagnosis of SARS-CoV-2 by FDA under an Emergency Use Authorization (EUA).  This EUA will remain in effect (meaning this test can be used) for the duration of the COVID-19 declaration under Section 564(b)(1) of the Act, 21 U.S.C. section 360bbb-3(b)(1), unless the authorization is terminated or revoked sooner. Performed at [facility], [address] [place], [place] [id number]   Basic metabolic panel     Status: Abnormal   Collection Time: 09/16/18  2:23 PM  Result Value Ref Range   Sodium 137 135 - 145 mmol/L   Potassium 4.5 3.5 - 5.1 mmol/L   Chloride 96 (L) 98 - 111 mmol/L   CO2 30 22 - 32 mmol/L   Glucose, Bld 115 (H) 70 - 99 mg/dL   BUN 26 (H) 6 - 20 mg/dL   Creatinine, Ser 0.73 0.44 - 1.00 mg/dL   Calcium 8.8 (L) 8.9 - 10.3 mg/dL   GFR calc non Af Amer >60 >60 mL/min   GFR calc Af Amer >60 >60 mL/min   Anion gap 11 5 - 15    Comment: Performed at [facility], [address] [place], [place] [address]  CBC with Differential/Platelet     Status: Abnormal   Collection Time: 09/16/18  2:23 PM  Result Value Ref Range   WBC 4.8 4.0 - 10.5 K/uL   RBC 3.89 3.87 - 5.11 MIL/uL   Hemoglobin 12.2 12.0 - 15.0 g/dL   HCT 37.2 36.0 - 46.0 %   MCV 95.6 80.0 - 100.0 fL   MCH 31.4 26.0 - 34.0 pg   MCHC 32.8 30.0 - 36.0 g/dL   RDW 12.9 11.5 - 15.5 %   Platelets 87 (L) 150 - 400 K/uL    Comment: Immature Platelet Fraction may be clinically indicated, consider ordering this additional test [id number]    nRBC 0.0 0.0 - 0.2 %   Neutrophils Relative % 49 %   Neutro Abs 2.4 1.7 - 7.7 K/uL   Lymphocytes Relative 35 %   Lymphs Abs 1.7 0.7 - 4.0 K/uL   Monocytes Relative 12 %   Monocytes Absolute 0.6 0.1 - 1.0 K/uL   Eosinophils Relative 3 %   Eosinophils Absolute 0.1 0.0 - 0.5 K/uL   Basophils Relative 1 %   Basophils Absolute 0.1 0.0 - 0.1 K/uL   Smear Review PLATELETS APPEAR DECREASED    Immature  Granulocytes 0 %   Abs Immature Granulocytes 0.02 0.00 - 0.07 K/uL    Comment: Performed at [facility] [address] [place], [place] [address]  Glucose, capillary     Status: Abnormal   Collection Time: 09/16/18  4:15 PM  Result Value Ref Range   Glucose-Capillary 135 (H) 70 - 99 mg/dL  Glucose, capillary     Status: Abnormal   Collection Time: 09/16/18  8:46 PM  Result Value Ref Range   Glucose-Capillary 149 (H) 70 - 99 mg/dL  Glucose, capillary     Status: Abnormal   Collection Time: 09/17/18  6:59 AM  Result Value Ref Range   Glucose-Capillary 180 (H) 70 - 99 mg/dL   Comment 1 Notify RN     Blood Alcohol level:  Lab Results  Component Value Date   ETH <10 07/19/2018   ETH <10 [id number]    Metabolic Disorder Labs: Lab Results  Component Value Date   HGBA1C 5.3 06/05/2018   MPG  105.41 06/05/2018   MPG 102.54 12/30/2017   No results found for: PROLACTIN Lab Results  Component Value Date   CHOL 155 06/05/2018   TRIG 93 06/05/2018   HDL 54 06/05/2018   CHOLHDL 2.9 06/05/2018   VLDL 19 06/05/2018   LDLCALC 82 06/05/2018   LDLCALC 90 12/30/2017    Physical Findings: AIMS: Facial and Oral Movements Muscles of Facial Expression: None, normal Lips and Perioral Area: None, normal Jaw: None, normal Tongue: None, normal,Extremity Movements Upper (arms, wrists, hands, fingers): None, normal Lower (legs, knees, ankles, toes): None, normal, Trunk Movements Neck, shoulders, hips: None, normal, Overall Severity Severity of abnormal movements (highest score from questions above): None, normal Incapacitation due to abnormal movements: None, normal Patient's awareness of abnormal movements (rate only patient's report): No Awareness, Dental Status Current problems with teeth and/or dentures?: Yes Does patient usually wear dentures?: No  CIWA:    COWS:     Musculoskeletal: Strength & Muscle Tone: within normal limits Gait & Station: normal Patient leans:  N/A  Psychiatric Specialty Exam: Physical Exam  ROS  Blood pressure (!) 125/57, pulse 71, temperature 97.8 F (36.6 C), temperature source Oral, resp. rate 18, height 4\' 10"  (1.473 m), weight 72.6 kg, SpO2 93 %.Body mass index is 33.44 kg/m.  General Appearance: Casual  Eye Contact:  Fair  Speech:  Slow  Volume:  Decreased  Mood:  Euthymic  Affect:  Constricted  Thought Process:  Linear and Descriptions of Associations: Circumstantial  Orientation:  Full (Time, Place, and Person)  Thought Content:  Logical and Tangential  Suicidal Thoughts: Denies  Homicidal Thoughts:  No  Memory:  Immediate;   Fair Recent;   Fair Remote;   Fair  Judgement:  Fair  Insight:  Fair  Psychomotor Activity:  Normal  Concentration:  Concentration: Fair  Recall:  [employer] of Knowledge:  Fair  Language:  Fair  Akathisia:  Negative  Handed:  Right  AIMS (if indicated):     Assets:  Leisure Time Physical Health Resilience  ADL's:  Intact  Cognition:  WNL  Sleep:  Number of Hours: 8     Treatment Plan Summary: Daily contact with patient to assess and evaluate symptoms and progress in treatment, Medication management and Plan Continue current mood stabilizer therapy continue rehab/cognitive-based therapies continue to monitor on current precautions anticipate discharge tomorrow according to treatment plan.  No change in treatment regimen/basic risk-benefit side effects are discussed though generally lost due to intellectual delay  Johnn Hai, MD 09/17/2018, 8:36 AM

## 2018-09-17 NOTE — Plan of Care (Signed)
D: Patient denies SI/HI/AVH. Patient verbally contracts for safety. Patient is calm, cooperative and pleasant. Patient is seen in milieu interacting with peers. Patient has no complaints at this time.  A: Patient was assessed by this nurse. Patient was oriented to unit. Patient's safety was maintained on unit. Q x 15 minute observation checks were completed for safety. Patient care plan was reviewed. Patient was offered support and encouragement. Patient was encourage to attend groups, participate in unit activities and continue with plan of care.    R: Patient has no complaints of pain at this time. Patient is receptive to treatment and safety maintained on unit.     Problem: Education: Goal: Knowledge of [place] General Education information/materials will improve Outcome: Not Progressing   Problem: Coping: Goal: Ability to verbalize frustrations and anger appropriately will improve Outcome: Not Progressing Goal: Ability to demonstrate self-control will improve Outcome: Progressing   Problem: Safety: Goal: Periods of time without injury will increase Outcome: Progressing

## 2018-09-18 LAB — GLUCOSE, CAPILLARY
Glucose-Capillary: 160 mg/dL — ABNORMAL HIGH (ref 70–99)
Glucose-Capillary: 121 mg/dL — ABNORMAL HIGH (ref 70–99)
Glucose-Capillary: 158 mg/dL — ABNORMAL HIGH (ref 70–99)
Glucose-Capillary: 138 mg/dL — ABNORMAL HIGH (ref 70–99)

## 2018-09-18 NOTE — Progress Notes (Signed)
Supervisor spoke with Christean Grief, Agricultural consultant at [facility].  In addition to exception form they also need discharge summary and covid test for Monday.  Supervisor spoke with Conchita Paris in psych triage at [facility].  Their inpt unit remains at capacity.    Supervisor faxed exception form to [facility], spoke to Allen, Pension scheme manager, to confirm that they had received it.  Anderson Malta will have form completed and fax back to [phone].   Winferd Humphrey, MSW, LCSW Advanced Care Supervisor 09/18/2018 3:25 PM

## 2018-09-18 NOTE — Plan of Care (Signed)
D- Patient alert and oriented. Patient presents in a pleasant mood on assessment reporting that she slept good last night and had no complaints to voice to this Probation officer. Patient reported no signs/symptoms of depression/anxiety on her self-inventory. Patient states she is trying to be good. Patient denies SI, HI, AVH, and pain at this time. Patient's goal for today is "leaving tomorrow".  A- Scheduled medications administered to patient, per MD orders. Support and encouragement provided.  Routine safety checks conducted every 15 minutes.  Patient informed to notify staff with problems or concerns.  R- No adverse drug reactions noted. Patient contracts for safety at this time. Patient compliant with medications and treatment plan. Patient receptive, calm, and cooperative. Patient interacts well with others on the unit.  Patient remains safe at this time.  Problem: Education: Goal: Knowledge of [place] General Education information/materials will improve Outcome: Progressing   Problem: Coping: Goal: Ability to verbalize frustrations and anger appropriately will improve Outcome: Progressing Goal: Ability to demonstrate self-control will improve Outcome: Progressing   Problem: Safety: Goal: Periods of time without injury will increase Outcome: Progressing   Problem: Safety: Goal: Ability to redirect hostility and anger into socially appropriate behaviors will improve Outcome: Progressing   Problem: Activity: Goal: Interest or engagement in leisure activities will improve Outcome: Progressing Goal: Imbalance in normal sleep/wake cycle will improve Outcome: Progressing

## 2018-09-18 NOTE — Progress Notes (Signed)
[facility] MD Progress Note  09/18/2018 9:43 AM Jessica [name]  MRN:  [id number] Subjective:    Generally pleasant alert oriented to person place time situation focused on discharge and requesting discharge tomorrow.  Denies current auditory or visual hallucinations, at times little intrusive but overall redirectable.  No EPS or TD.  Denies thoughts of harming self or others Principal Problem: Schizoaffective disorder, bipolar type ([place]) Diagnosis: Principal Problem:   Schizoaffective disorder, bipolar type ([place]) Active Problems:   Diabetes mellitus without complication ([place])   Intellectual disability   Agitation   Borderline personality disorder (HCC)   HTN (hypertension)   Hypothyroidism   Bipolar disorder, unspecified ([place])  Total Time spent with patient: 20 minutes  Past Psychiatric History: Extensive  Past Medical History:  Past Medical History:  Diagnosis Date  . Anxiety   . Depression   . Diabetes mellitus without complication ([place])   . GERD (gastroesophageal reflux disease)   . Homicidal ideation   . Hypertension   . Hypothyroidism     Past Surgical History:  Procedure Laterality Date  . COLPOSCOPY    . FRACTURE SURGERY    . TRACHEOSTOMY     Family History: History reviewed. No pertinent family history. Family Psychiatric  History: No new data shared Social History:  Social History   Substance and Sexual Activity  Alcohol Use No  . Frequency: Never     Social History   Substance and Sexual Activity  Drug Use Yes  . Types: Marijuana, "Crack" cocaine   Comment: reports she has not done anything in a long time    Social History   Socioeconomic History  . Marital status: Single    Spouse name: Not on file  . Number of children: Not on file  . Years of education: Not on file  . Highest education level: Not on file  Occupational History  . Not on file  Social Needs  . Financial resource strain: Somewhat hard  . Food insecurity    Worry: Patient refused     Inability: Patient refused  . Transportation needs    Medical: Patient refused    Non-medical: Patient refused  Tobacco Use  . Smoking status: Former Research scientist (life sciences)  . Smokeless tobacco: Never Used  Substance and Sexual Activity  . Alcohol use: No    Frequency: Never  . Drug use: Yes    Types: Marijuana, "Crack" cocaine    Comment: reports she has not done anything in a long time  . Sexual activity: Not Currently    Birth control/protection: Abstinence  Lifestyle  . Physical activity    Days per week: Patient refused    Minutes per session: Patient refused  . Stress: Rather much  Relationships  . Social Herbalist on phone: Patient refused    Gets together: Patient refused    Attends religious service: Patient refused    Active member of club or organization: Patient refused    Attends meetings of clubs or organizations: Patient refused    Relationship status: Patient refused  Other Topics Concern  . Not on file  Social History Narrative  . Not on file   Additional Social History:                         Sleep: Fair  Appetite:  Fair  Current Medications: Current Facility-Administered Medications  Medication Dose Route Frequency Provider Last Rate Last Dose  . acetaminophen (TYLENOL) tablet 650 mg  650  mg Oral Q6H PRN Money, Lowry Ram, FNP   650 mg at 09/02/18 [id number]  . alum & mag hydroxide-simeth (MAALOX/MYLANTA) 200-200-20 MG/5ML suspension 30 mL  30 mL Oral Q4H PRN Money, Lowry Ram, FNP      . atorvastatin (LIPITOR) tablet 10 mg  10 mg Oral q1800 Money, Lowry Ram, FNP   10 mg at 09/17/18 1607  . carvedilol (COREG) tablet 3.125 mg  3.125 mg Oral BID WC Sharma Covert, MD   3.125 mg at 09/18/18 0831  . DULoxetine (CYMBALTA) DR capsule 60 mg  60 mg Oral Daily Clapacs, Madie Reno, MD   60 mg at 09/18/18 0831  . ferrous sulfate tablet 325 mg  325 mg Oral BID WC Money, Lowry Ram, FNP   325 mg at 09/18/18 0831  . gabapentin (NEURONTIN) capsule 900 mg  900 mg Oral  TID Clapacs, Madie Reno, MD   900 mg at 09/18/18 [phone]  . guanFACINE (INTUNIV) ER tablet 2 mg  2 mg Oral Daily Clapacs, John T, MD   2 mg at 09/18/18 0831  . hydrOXYzine (ATARAX/VISTARIL) tablet 50 mg  50 mg Oral Q6H PRN Clapacs, Madie Reno, MD   50 mg at 09/10/18 1131  . insulin aspart (novoLOG) injection 0-5 Units  0-5 Units Subcutaneous QHS Money, Lowry Ram, FNP   Stopped at 08/31/18 2144  . insulin aspart (novoLOG) injection 0-9 Units  0-9 Units Subcutaneous TID WC Money, Lowry Ram, FNP   2 Units at 09/18/18 0830  . insulin glargine (LANTUS) injection 15 Units  15 Units Subcutaneous QHS Sharma Covert, MD   15 Units at 09/17/18 2124  . lamoTRIgine (LAMICTAL) tablet 175 mg  175 mg Oral QHS Sharma Covert, MD   175 mg at 09/17/18 2124  . levothyroxine (SYNTHROID) tablet 150 mcg  150 mcg Oral Q0600 Money, Lowry Ram, FNP   150 mcg at 09/18/18 0604  . loratadine (CLARITIN) tablet 10 mg  10 mg Oral Daily Clapacs, Madie Reno, MD   10 mg at 09/18/18 0831  . LORazepam (ATIVAN) tablet 2 mg  2 mg Oral Q4H PRN Clapacs, Madie Reno, MD   2 mg at 09/17/18 2124   Or  . LORazepam (ATIVAN) injection 2 mg  2 mg Intramuscular Q4H PRN Clapacs, Madie Reno, MD   2 mg at 09/07/18 0732  . lurasidone (LATUDA) tablet 80 mg  80 mg Oral BID WC Sharma Covert, MD   80 mg at 09/18/18 0831  . magnesium hydroxide (MILK OF MAGNESIA) suspension 30 mL  30 mL Oral Daily PRN Money, Darnelle Maffucci B, FNP      . metFORMIN (GLUCOPHAGE) tablet 1,000 mg  1,000 mg Oral BID WC Sharma Covert, MD   1,000 mg at 09/18/18 0830  . moxifloxacin (VIGAMOX) 0.5 % ophthalmic solution 1 drop  1 drop Right Eye TID Money, Lowry Ram, FNP   1 drop at 09/18/18 [id number]  . neomycin-bacitracin-polymyxin (NEOSPORIN) ointment   Topical BID Sharma Covert, MD   1 application at [id number]  . pantoprazole (PROTONIX) EC tablet 40 mg  40 mg Oral Daily Money, Lowry Ram, FNP   40 mg at 09/18/18 0831  . traZODone (DESYREL) tablet 100 mg  100 mg Oral QHS Money, Travis B, FNP   100  mg at 09/17/18 2124  . ziprasidone (GEODON) injection 20 mg  20 mg Intramuscular Q12H PRN Clapacs, Madie Reno, MD   20 mg at 09/13/18 [id number]    Lab Results:  Results for orders  placed or performed during the hospital encounter of 08/04/18 (from the past 48 hour(s))  Glucose, capillary     Status: Abnormal   Collection Time: 09/16/18 11:33 AM  Result Value Ref Range   Glucose-Capillary 276 (H) 70 - 99 mg/dL  SARS Coronavirus 2 [facility] order, Performed in [facility] hospital lab) Nasopharyngeal Nasopharyngeal Swab     Status: None   Collection Time: 09/16/18 12:12 PM   Specimen: Nasopharyngeal Swab  Result Value Ref Range   SARS Coronavirus 2 NEGATIVE NEGATIVE    Comment: (NOTE) If result is NEGATIVE SARS-CoV-2 target nucleic acids are NOT DETECTED. The SARS-CoV-2 RNA is generally detectable in upper and lower  respiratory specimens during the acute phase of infection. The lowest  concentration of SARS-CoV-2 viral copies this assay can detect is 250  copies / mL. A negative result does not preclude SARS-CoV-2 infection  and should not be used as the sole basis for treatment or other  patient management decisions.  A negative result may occur with  improper specimen collection / handling, submission of specimen other  than nasopharyngeal swab, presence of viral mutation(s) within the  areas targeted by this assay, and inadequate number of viral copies  (<250 copies / mL). A negative result must be combined with clinical  observations, patient history, and epidemiological information. If result is POSITIVE SARS-CoV-2 target nucleic acids are DETECTED. The SARS-CoV-2 RNA is generally detectable in upper and lower  respiratory specimens dur ing the acute phase of infection.  Positive  results are indicative of active infection with SARS-CoV-2.  Clinical  correlation with patient history and other diagnostic information is  necessary to determine patient infection status.  Positive results  do  not rule out bacterial infection or co-infection with other viruses. If result is PRESUMPTIVE POSTIVE SARS-CoV-2 nucleic acids MAY BE PRESENT.   A presumptive positive result was obtained on the submitted specimen  and confirmed on repeat testing.  While 2019 novel coronavirus  (SARS-CoV-2) nucleic acids may be present in the submitted sample  additional confirmatory testing may be necessary for epidemiological  and / or clinical management purposes  to differentiate between  SARS-CoV-2 and other Sarbecovirus currently known to infect humans.  If clinically indicated additional testing with an alternate test  methodology [phone]) is advised. The SARS-CoV-2 RNA is generally  detectable in upper and lower respiratory sp ecimens during the acute  phase of infection. The expected result is Negative. Fact Sheet for Patients:  StrictlyIdeas.no Fact Sheet for Healthcare Providers: BankingDealers.co.za This test is not yet approved or cleared by the [place] FDA and has been authorized for detection and/or diagnosis of SARS-CoV-2 by FDA under an Emergency Use Authorization (EUA).  This EUA will remain in effect (meaning this test can be used) for the duration of the COVID-19 declaration under Section 564(b)(1) of the Act, 21 U.S.C. section 360bbb-3(b)(1), unless the authorization is terminated or revoked sooner. Performed at [facility], [address] [place], [place] [id number]   Basic metabolic panel     Status: Abnormal   Collection Time: 09/16/18  2:23 PM  Result Value Ref Range   Sodium 137 135 - 145 mmol/L   Potassium 4.5 3.5 - 5.1 mmol/L   Chloride 96 (L) 98 - 111 mmol/L   CO2 30 22 - 32 mmol/L   Glucose, Bld 115 (H) 70 - 99 mg/dL   BUN 26 (H) 6 - 20 mg/dL   Creatinine, Ser 0.73 0.44 - 1.00 mg/dL   Calcium  8.8 (L) 8.9 - 10.3 mg/dL   GFR calc non Af Amer >60 >60 mL/min   GFR calc Af Amer >60 >60 mL/min   Anion  gap 11 5 - 15    Comment: Performed at [facility], [address] [place], [place] [address]  CBC with Differential/Platelet     Status: Abnormal   Collection Time: 09/16/18  2:23 PM  Result Value Ref Range   WBC 4.8 4.0 - 10.5 K/uL   RBC 3.89 3.87 - 5.11 MIL/uL   Hemoglobin 12.2 12.0 - 15.0 g/dL   HCT 37.2 36.0 - 46.0 %   MCV 95.6 80.0 - 100.0 fL   MCH 31.4 26.0 - 34.0 pg   MCHC 32.8 30.0 - 36.0 g/dL   RDW 12.9 11.5 - 15.5 %   Platelets 87 (L) 150 - 400 K/uL    Comment: Immature Platelet Fraction may be clinically indicated, consider ordering this additional test [id number]    nRBC 0.0 0.0 - 0.2 %   Neutrophils Relative % 49 %   Neutro Abs 2.4 1.7 - 7.7 K/uL   Lymphocytes Relative 35 %   Lymphs Abs 1.7 0.7 - 4.0 K/uL   Monocytes Relative 12 %   Monocytes Absolute 0.6 0.1 - 1.0 K/uL   Eosinophils Relative 3 %   Eosinophils Absolute 0.1 0.0 - 0.5 K/uL   Basophils Relative 1 %   Basophils Absolute 0.1 0.0 - 0.1 K/uL   Smear Review PLATELETS APPEAR DECREASED    Immature Granulocytes 0 %   Abs Immature Granulocytes 0.02 0.00 - 0.07 K/uL    Comment: Performed at [facility], [address] [place], [place] [address]  Glucose, capillary     Status: Abnormal   Collection Time: 09/16/18  4:15 PM  Result Value Ref Range   Glucose-Capillary 135 (H) 70 - 99 mg/dL  Glucose, capillary     Status: Abnormal   Collection Time: 09/16/18  8:46 PM  Result Value Ref Range   Glucose-Capillary 149 (H) 70 - 99 mg/dL  Glucose, capillary     Status: Abnormal   Collection Time: 09/17/18  6:59 AM  Result Value Ref Range   Glucose-Capillary 180 (H) 70 - 99 mg/dL   Comment 1 Notify RN   Glucose, capillary     Status: Abnormal   Collection Time: 09/17/18 11:13 AM  Result Value Ref Range   Glucose-Capillary 254 (H) 70 - 99 mg/dL  Glucose, capillary     Status: Abnormal   Collection Time: 09/17/18  4:03 PM  Result Value Ref Range   Glucose-Capillary 145 (H) 70 - 99 mg/dL   Glucose, capillary     Status: Abnormal   Collection Time: 09/17/18  9:01 PM  Result Value Ref Range   Glucose-Capillary 133 (H) 70 - 99 mg/dL   Comment 1 Notify RN   Glucose, capillary     Status: Abnormal   Collection Time: 09/18/18  7:31 AM  Result Value Ref Range   Glucose-Capillary 160 (H) 70 - 99 mg/dL    Blood Alcohol level:  Lab Results  Component Value Date   ETH <10 07/19/2018   ETH <10 [id number]    Metabolic Disorder Labs: Lab Results  Component Value Date   HGBA1C 5.3 06/05/2018   MPG 105.41 06/05/2018   MPG 102.54 12/30/2017   No results found for: PROLACTIN Lab Results  Component Value Date   CHOL 155 06/05/2018   TRIG 93 06/05/2018   HDL 54 06/05/2018   CHOLHDL  2.9 06/05/2018   VLDL 19 06/05/2018   LDLCALC 82 06/05/2018   LDLCALC 90 12/30/2017    Physical Findings: AIMS: Facial and Oral Movements Muscles of Facial Expression: None, normal Lips and Perioral Area: None, normal Jaw: None, normal Tongue: None, normal,Extremity Movements Upper (arms, wrists, hands, fingers): None, normal Lower (legs, knees, ankles, toes): None, normal, Trunk Movements Neck, shoulders, hips: None, normal, Overall Severity Severity of abnormal movements (highest score from questions above): None, normal Incapacitation due to abnormal movements: None, normal Patient's awareness of abnormal movements (rate only patient's report): No Awareness, Dental Status Current problems with teeth and/or dentures?: Yes Does patient usually wear dentures?: No  CIWA:    COWS:    Musculoskeletal: Strength & Muscle Tone: within normal limits Gait & Station: normal Patient leans: N/A  Psychiatric Specialty Exam: Physical Exam  ROS  Blood pressure (!) 125/57, pulse 71, temperature 97.8 F (36.6 C), temperature source Oral, resp. rate 18, height 4\' 10"  (1.473 m), weight 72.6 kg, SpO2 93 %.Body mass index is 33.44 kg/m.  General Appearance: Casual  Eye Contact:  Fair  Speech:   Slow  Volume:  Decreased  Mood:  Euthymic  Affect:  Constricted  Thought Process:  Linear and Descriptions of Associations: Circumstantial  Orientation:  Full (Time, Place, and Person)  Thought Content:  Logical and Tangential  Suicidal Thoughts: Denies  Homicidal Thoughts:  No  Memory:  Immediate;   Fair Recent;   Fair Remote;   Fair  Judgement:  Fair  Insight:  Fair  Psychomotor Activity:  Normal  Concentration:  Concentration: Fair  Recall:  [employer] of Knowledge:  Fair  Language:  Fair  Akathisia:  Negative  Handed:  Right  AIMS (if indicated):     Assets:  Leisure Time Physical Health Resilience  ADL's:  Intact  Cognition:  WNL  Sleep:  Number of Hours: 8     Treatment Plan Summary: Daily contact with patient to assess and evaluate symptoms and progress in treatment and Medication management  Continue current cognitive-based and reality based therapies continue current med regimen probable discouraged tomorrow if planned and continues to show this much improvement.  No change in precautions, basic risk-benefit side effects of meds known to patient now  Johnn Hai, MD 09/18/2018, 9:43 AM

## 2018-09-18 NOTE — [facility] Group Notes (Signed)
LCSW Aftercare Discharge Planning Group Note   09/18/2018 1315  Type of Group and Topic: Psychoeducational Group:  Discharge Planning  Participation Level:  Active  Description of Group  Discharge planning group reviews patient's anticipated discharge plans and assists patients to anticipate and address any barriers to wellness/recovery in the community.  Suicide prevention education is reviewed with patients in group.  Therapeutic Goals 1. Patients will state their anticipated discharge plan and mental health aftercare 2. Patients will identify potential barriers to wellness in the community setting 3. Patients will engage in problem solving, solution focused discussion of ways to anticipate and address barriers to wellness/recovery  Summary of Patient Progress: Pt well behaved and attentive throughout group today, focused on talking about need to take her medication after discharge.  Appropriate throughout.    Plan for Discharge/Comments:  Pt aware she is being transferred to [facility].  Transportation Means:   Supports:  Therapeutic Modalities: Motivational Interviewing    Joanne Chars, LCSW 09/18/2018 2:25 PM

## 2018-09-19 LAB — GLUCOSE, CAPILLARY
Glucose-Capillary: 192 mg/dL — ABNORMAL HIGH (ref 70–99)
Glucose-Capillary: 110 mg/dL — ABNORMAL HIGH (ref 70–99)

## 2018-09-19 NOTE — Discharge Summary (Signed)
Physician Discharge Summary Note  Patient:  Jessica [name] is an 53 y.o., female MRN:  [id number] DOB:  April 29, 1965 Patient phone:  [phone] (home)  Patient address:   [place] [address],  Total Time spent with patient: 45 minutes  Date of Admission:  08/04/2018 Date of Discharge: September 19, 2018  Reason for Admission: Patient admitted to the psychiatric service after a brief stay on medicine.  She had been in the emergency room initially because of agitation and violence and uncontrolled mood symptoms at her group home.  Principal Problem: Schizoaffective disorder, bipolar type [facility]) Discharge Diagnoses: Principal Problem:   Schizoaffective disorder, bipolar type ([place]) Active Problems:   Diabetes mellitus without complication ([place])   Intellectual disability   Agitation   Borderline personality disorder (HCC)   HTN (hypertension)   Hypothyroidism   Bipolar disorder, unspecified ([place])   Past Psychiatric History: Patient has a long history of impulsivity anger mood instability failure to respond to standard treatment.  Has become aggressive at multiple group home settings and remains aggressive intermittently on the psychiatric unit including aggression towards staff and peers.  Diagnosis of schizoaffective disorder.  Some response to medication.  Also diagnosis of mild to moderate developmental disability  Past Medical History:  Past Medical History:  Diagnosis Date  . Anxiety   . Depression   . Diabetes mellitus without complication ([place])   . GERD (gastroesophageal reflux disease)   . Homicidal ideation   . Hypertension   . Hypothyroidism     Past Surgical History:  Procedure Laterality Date  . COLPOSCOPY    . FRACTURE SURGERY    . TRACHEOSTOMY     Family History: History reviewed. No pertinent family history. Family Psychiatric  History: None reported Social History:  Social History   Substance and Sexual Activity  Alcohol Use No  . Frequency:  Never     Social History   Substance and Sexual Activity  Drug Use Yes  . Types: Marijuana, "Crack" cocaine   Comment: reports she has not done anything in a long time    Social History   Socioeconomic History  . Marital status: Single    Spouse name: Not on file  . Number of children: Not on file  . Years of education: Not on file  . Highest education level: Not on file  Occupational History  . Not on file  Social Needs  . Financial resource strain: Somewhat hard  . Food insecurity    Worry: Patient refused    Inability: Patient refused  . Transportation needs    Medical: Patient refused    Non-medical: Patient refused  Tobacco Use  . Smoking status: Former Research scientist (life sciences)  . Smokeless tobacco: Never Used  Substance and Sexual Activity  . Alcohol use: No    Frequency: Never  . Drug use: Yes    Types: Marijuana, "Crack" cocaine    Comment: reports she has not done anything in a long time  . Sexual activity: Not Currently    Birth control/protection: Abstinence  Lifestyle  . Physical activity    Days per week: Patient refused    Minutes per session: Patient refused  . Stress: Rather much  Relationships  . Social Herbalist on phone: Patient refused    Gets together: Patient refused    Attends religious service: Patient refused    Active member of club or organization: Patient refused    Attends meetings of clubs or organizations: Patient refused  Relationship status: Patient refused  Other Topics Concern  . Not on file  Social History Narrative  . Not on file    Hospital Course: This is an updated note.  See previous discharge summary.  Since the last discharge summary there have been no significant clinical changes.  Patient continues to be intermittently angry and impulsive but not consistently psychotic.  She has been cooperative with medications.  No psychiatric medication changes have occurred.  Her blood sugars have been running in the low 100s without  any symptoms of hypoglycemia or hyperglycemia.  Her blood pressures have been stable.  No changes to medication.  Patient continues to be aware that the plan is transfer to [facility] and is cooperative.  Physical Findings: AIMS: Facial and Oral Movements Muscles of Facial Expression: None, normal Lips and Perioral Area: None, normal Jaw: None, normal Tongue: None, normal,Extremity Movements Upper (arms, wrists, hands, fingers): None, normal Lower (legs, knees, ankles, toes): None, normal, Trunk Movements Neck, shoulders, hips: None, normal, Overall Severity Severity of abnormal movements (highest score from questions above): None, normal Incapacitation due to abnormal movements: None, normal Patient's awareness of abnormal movements (rate only patient's report): No Awareness, Dental Status Current problems with teeth and/or dentures?: Yes Does patient usually wear dentures?: No  CIWA:    COWS:     Musculoskeletal: Strength & Muscle Tone: within normal limits Gait & Station: normal Patient leans: N/A  Psychiatric Specialty Exam: Physical Exam  Nursing note and vitals reviewed. Constitutional: She appears well-developed and well-nourished.  HENT:  Head: Normocephalic and atraumatic.  Eyes: Pupils are equal, round, and reactive to light. Conjunctivae are normal.  Neck: Normal range of motion.  Cardiovascular: Regular rhythm and normal heart sounds.  Respiratory: Effort normal. No respiratory distress.  GI: Soft.  Musculoskeletal: Normal range of motion.  Neurological: She is alert.  Skin: Skin is warm and dry.  Psychiatric: Her affect is blunt. Her speech is delayed. She is slowed. Cognition and memory are impaired. She expresses impulsivity. She expresses no homicidal and no suicidal ideation.    Review of Systems  Constitutional: Negative.   HENT: Negative.   Eyes: Negative.   Respiratory: Negative.   Cardiovascular: Negative.   Gastrointestinal: Negative.    Musculoskeletal: Negative.   Skin: Negative.   Neurological: Negative.   Psychiatric/Behavioral: Negative for depression, hallucinations, memory loss, substance abuse and suicidal ideas. The patient is nervous/anxious. The patient does not have insomnia.     Blood pressure 111/68, pulse 69, temperature 98.2 F (36.8 C), temperature source Oral, resp. rate 16, height 4\' 10"  (1.473 m), weight 72.6 kg, SpO2 96 %.Body mass index is 33.44 kg/m.  General Appearance: Casual  Eye Contact:  Good  Speech:  Clear and Coherent  Volume:  Normal  Mood:  Euthymic  Affect:  Constricted  Thought Process:  Goal Directed  Orientation:  Full (Time, Place, and Person)  Thought Content:  Rumination  Suicidal Thoughts:  No  Homicidal Thoughts:  No  Memory:  Immediate;   Fair Recent;   Fair Remote;   Fair  Judgement:  Impaired  Insight:  Shallow  Psychomotor Activity:  Normal  Concentration:  Concentration: Fair  Recall:  [employer] of Knowledge:  Fair  Language:  Fair  Akathisia:  No  Handed:  Right  AIMS (if indicated):     Assets:  Desire for Improvement Resilience  ADL's:  Impaired  Cognition:  Impaired,  Mild and Moderate  Sleep:  Number of Hours: 8.25  Have you used any form of tobacco in the last 30 days? (Cigarettes, Smokeless Tobacco, Cigars, and/or Pipes): No  Has this patient used any form of tobacco in the last 30 days? (Cigarettes, Smokeless Tobacco, Cigars, and/or Pipes) Yes, No  Blood Alcohol level:  Lab Results  Component Value Date   ETH <10 07/19/2018   ETH <10 [id number]    Metabolic Disorder Labs:  Lab Results  Component Value Date   HGBA1C 5.3 06/05/2018   MPG 105.41 06/05/2018   MPG 102.54 12/30/2017   No results found for: PROLACTIN Lab Results  Component Value Date   CHOL 155 06/05/2018   TRIG 93 06/05/2018   HDL 54 06/05/2018   CHOLHDL 2.9 06/05/2018   VLDL 19 06/05/2018   [place] 82 06/05/2018   [place] 90 12/30/2017    See Psychiatric  Specialty Exam and Suicide Risk Assessment completed by Attending Physician prior to discharge.  Discharge destination:  [facility] hospital  Is patient on multiple antipsychotic therapies at discharge:  No   Has Patient had three or more failed trials of antipsychotic monotherapy by history:  No  Recommended Plan for Multiple Antipsychotic Therapies: NA  Discharge Instructions    Diet - low sodium heart healthy   Complete by: As directed    Increase activity slowly   Complete by: As directed      Allergies as of 09/19/2018      Reactions   Abilify [aripiprazole] Other (See Comments)   Chokes on food   Haldol [haloperidol] Other (See Comments)   Dizziness   Risperdal [risperidone] Other (See Comments)   Leg weakness      Medication List    STOP taking these medications   divalproex 250 MG DR tablet Commonly known as: DEPAKOTE   gabapentin 600 MG tablet Commonly known as: NEURONTIN Replaced by: gabapentin 300 MG capsule   glipiZIDE 5 MG 24 hr tablet Commonly known as: GLUCOTROL XL   insulin detemir 100 UNIT/ML injection Commonly known as: LEVEMIR   lisinopril 10 MG tablet Commonly known as: ZESTRIL   moxifloxacin 0.5 % ophthalmic solution Commonly known as: VIGAMOX   paliperidone 3 MG 24 hr tablet Commonly known as: INVEGA     TAKE these medications     Indication  atorvastatin 10 MG tablet Commonly known as: LIPITOR Take 1 tablet (10 mg total) by mouth daily at 6 PM. What changed: when to take this  Indication: High Amount of Fats in the Blood   carvedilol 3.125 MG tablet Commonly known as: COREG Take 1 tablet (3.125 mg total) by mouth 2 (two) times daily with a meal. What changed:   medication strength  how much to take  when to take this  Indication: High Blood Pressure Disorder   DULoxetine 60 MG capsule Commonly known as: CYMBALTA Take 1 capsule (60 mg total) by mouth daily.  Indication: Major Depressive Disorder   ferrous sulfate 325 (65  FE) MG tablet Take 1 tablet (325 mg total) by mouth 2 (two) times daily with a meal. What changed: when to take this  Indication: Anemia From Inadequate Iron in the Body   gabapentin 300 MG capsule Commonly known as: NEURONTIN Take 3 capsules (900 mg total) by mouth 3 (three) times daily. Replaces: gabapentin 600 MG tablet  Indication: Fibromyalgia Syndrome   guanFACINE 2 MG Tb24 ER tablet Commonly known as: INTUNIV Take 1 tablet (2 mg total) by mouth daily.  Indication: Attention Deficit Hyperactivity Disorder   hydrOXYzine 50 MG tablet Commonly known  as: ATARAX/VISTARIL Take 1 tablet (50 mg total) by mouth every 6 (six) hours as needed for anxiety.  Indication: Feeling Anxious   insulin aspart 100 UNIT/ML injection Commonly known as: novoLOG Inject 0-5 Units into the skin at bedtime.  Indication: Type 2 Diabetes   insulin aspart 100 UNIT/ML injection Commonly known as: novoLOG Inject 0-9 Units into the skin 3 (three) times daily with meals.  Indication: Type 2 Diabetes   insulin glargine 100 UNIT/ML injection Commonly known as: LANTUS Inject 0.15 mLs (15 Units total) into the skin at bedtime.  Indication: Type 2 Diabetes   lamoTRIgine 25 MG tablet Commonly known as: LAMICTAL Take 7 tablets (175 mg total) by mouth at bedtime. What changed:   how much to take  when to take this  Indication: Depressive Phase of Manic-Depression   levothyroxine 150 MCG tablet Commonly known as: SYNTHROID Take 1 tablet (150 mcg total) by mouth daily at 6 (six) AM.  Indication: Underactive Thyroid   loratadine 10 MG tablet Commonly known as: CLARITIN Take 1 tablet (10 mg total) by mouth daily.  Indication: Perennial Allergic Rhinitis   lurasidone 80 MG Tabs tablet Commonly known as: LATUDA Take 1 tablet (80 mg total) by mouth 2 (two) times daily with a meal.  Indication: Depressive Phase of Manic-Depression, Schizophrenia   metFORMIN 1000 MG tablet Commonly known as:  GLUCOPHAGE Take 1 tablet (1,000 mg total) by mouth 2 (two) times daily with a meal. What changed:   medication strength  how much to take  when to take this  Indication: Type 2 Diabetes   pantoprazole 40 MG tablet Commonly known as: PROTONIX Take 1 tablet (40 mg total) by mouth daily.  Indication: Gastroesophageal Reflux Disease   traZODone 100 MG tablet Commonly known as: DESYREL Take 1 tablet (100 mg total) by mouth at bedtime.  Indication: Trouble Sleeping        Follow-up recommendations:  Activity:  Activity as tolerated Diet:  Carbohydrate controlled diet Other:  [facility]  Comments: Patient agreeable to plan.  Case reviewed with social work.  This concludes the updated discharge summary  Signed: Alethia Berthold, MD 09/19/2018, 11:57 AM

## 2018-09-19 NOTE — Progress Notes (Signed)
  [facility] Adult Case Management Discharge Plan :  Will you be returning to the same living situation after discharge:  No. Pt going to [place] At discharge, do you have transportation home?: Yes,  [place] Do you have the ability to pay for your medications: Yes,  Medicaid and medicare  Release of information consent forms completed and in the chart;    Patient to Follow up at: [facility] Follow up.   Specialty: Behavioral Health Why: You have been accepted at [facility]! Contact information: [place] [place] [phone]          Next level of care provider has access to [place] and Suicide Prevention discussed: Yes,  SPE completed with pts legal guardian  Have you used any form of tobacco in the last 30 days? (Cigarettes, Smokeless Tobacco, Cigars, and/or Pipes): No  Has patient been referred to the Quitline?: N/A patient is not a smoker  Patient has been referred for addiction treatment: [place], LCSW 09/19/2018, 2:49 PM

## 2018-09-19 NOTE — Progress Notes (Signed)
Patient ID: Jessica [name], female   DOB: [birth year], 53 y.o.   MRN: [id number]  Discharge Note:  Patient denies SI/HI/AVH at this time. Discharge instructions, AVS, and transition record gone over with patient. Patient agrees to comply with medication management, follow-up visit, and outpatient therapy. Patient belongings returned to patient. Patient questions and concerns addressed and answered. Patient ambulatory off unit. Patient discharged to [facility] via [facility].

## 2018-09-19 NOTE — BHH Counselor (Signed)
CSW received voicemail from April at [facility] requesting a return call to discuss the exception form as she had quetions.  CSW called ([phone]) and asked to speak with April.  CSW was informed that April was not in and the reception reviewed the chart to see who else CSW could speak with.  CSW was transferred to the UM line at [phone] option 2).  CSW spoke with [facility] and asked for any clarification on the questions that April had or issues with the patients diversion forms.  CSW was informed that Laqueta Linden was not sure and would look into it and return call.  Assunta Curtis, MSW, LCSW 09/19/2018 9:08 AM

## 2018-09-19 NOTE — Plan of Care (Signed)
  Problem: Disruptive/Impulsive Goal: STG - Patient will demonstrate no violent or destructive behaviors during recreation therapy group sessions for duration of admission Description: STG - Patient will demonstrate no violent or destructive behaviors during recreation therapy group sessions for duration of admission Outcome: Completed/Met

## 2018-09-19 NOTE — Progress Notes (Signed)
Recreation Therapy Notes   Date: 09/19/2018  Time: 9:30 am  Location: Craft room  Behavioral response: Appropriate   Intervention Topic: Relaxation   Discussion/Intervention:  Group content today was focused on relaxation. The group defined relaxation and identified healthy ways to relax. Individuals expressed how much time they spend relaxing. Patients expressed how much their life would be if they did not make time for themselves to relax. The group stated ways they could improve their relaxation techniques in the future.  Individuals participated in the intervention "Time to Relax" where they had a chance to experience different relaxation techniques.  Clinical Observations/Feedback:  Patient came to group and defined relaxation as exercise and mediatation. She explained that she likes to pet animals to relax. Participant left group early due to unknown reasons but later returned. Individual was social with peers and staff while participating in the intervention.    [place] LRT/CTRS         Allye Hoyos 09/19/2018 11:16 AM

## 2018-09-19 NOTE — Progress Notes (Signed)
Recreation Therapy Notes  INPATIENT RECREATION TR PLAN  Patient Details Name: Jessica [name] MRN: [id number] DOB: 14-Feb-1965 Today's Date: 09/19/2018  Rec Therapy Plan Is patient appropriate for Therapeutic Recreation?: Yes Treatment times per week: at least 3 Estimated Length of Stay: 5-7 days TR Treatment/Interventions: Group participation (Comment)  Discharge Criteria Pt will be discharged from therapy if:: Discharged Treatment plan/goals/alternatives discussed and agreed upon by:: Patient/family  Discharge Summary Short term goals set: Patient will demonstrate no violent or destructive behaviors during recreation therapy group sessions for duration of admission Short term goals met: Complete Progress toward goals comments: Groups attended Which groups?: Communication, Coping skills, Goal setting, Anger management, Other (Comment)(Relaxation, Time-Management, Self-care) Reason goals not met: N/A Therapeutic equipment acquired: N/A Reason patient discharged from therapy: Discharge from hospital Pt/family agrees with progress & goals achieved: Yes Date patient discharged from therapy: 09/26/18   Demitris Pokorny 09/19/2018, 1:54 PM
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
## 2018-09-19 NOTE — [facility] Counselor (Signed)
CSW spoke with Pamala Hurry at [facility] confirmed that paperwork was received and that medical was reviewing it.    CSW was asked if better copy of guardianship paperwork was received and CSW informed that she sent what she had.  CSW pointed out that the paperwork was lefible for her and provided the date the form was signed as CSW could read it.  Pamala Hurry asked that patient be given a copy to give to [employer].  CSW placed a copy in the patient's discharge packet.    CSW also provided contact for Tedra Coupe at [employer], the patient's guardian.  Assunta Curtis, MSW, LCSW 09/19/2018 2:04 PM

## 2018-09-19 NOTE — Progress Notes (Signed)
CSW spoke with Pamala Hurry from [facility] who reported that pt is cleared to come to [facility] today 09/19/2018. CSW informed attending physician, nurse, and had transportation arraged via sheriff to pick pt up.   Evalina Field, MSW, LCSW Clinical Social Work 09/19/2018 2:47 PM

## 2018-09-19 NOTE — Progress Notes (Signed)
Patient has being doing fine coping coping in the unit  have not get into any argument  Or any recent out burst, very much excited about her discharge to another facility, patient has physical complains, patient is complying with her therapeutic regimen, and taking her medications with no noticeable side effects . Patient denies any SI/HI/AVH patient is appropriate , mood and affect is good , has not  Ask for any PRNs , sleep is appropriate , education and support is provided, 15 minutes safety check is maintained no distress.

## 2018-09-19 NOTE — [facility] Group Notes (Signed)
Overcoming Obstacles  09/19/2018 1PM  Type of Therapy and Topic:  Group Therapy:  Overcoming Obstacles  Participation Level:  Active    Description of Group:    In this group patients will be encouraged to explore what they see as obstacles to their own wellness and recovery. They will be guided to discuss their thoughts, feelings, and behaviors related to these obstacles. The group will process together ways to cope with barriers, with attention given to specific choices patients can make. Each patient will be challenged to identify changes they are motivated to make in order to overcome their obstacles. This group will be process-oriented, with patients participating in exploration of their own experiences as well as giving and receiving support and challenge from other group members.   Therapeutic Goals: 1. Patient will identify personal and current obstacles as they relate to admission. 2. Patient will identify barriers that currently interfere with their wellness or overcoming obstacles.  3. Patient will identify feelings, thought process and behaviors related to these barriers. 4. Patient will identify two changes they are willing to make to overcome these obstacles:      Summary of Patient Progress Pt identified having little patience as a challenge she would like to overcome. Pt says she becomes anxious if something does not occur in that moment. She discussed with group members her past drug addiction and 20 years of sobriety.    Therapeutic Modalities:   Cognitive Behavioral Therapy Solution Focused Therapy Motivational Interviewing Relapse Prevention Therapy    Sanjuana Kava, MSW, LCSW 09/19/2018 1:55 PM

## 2019-11-02 DIAGNOSIS — I10 Essential (primary) hypertension: Secondary | ICD-10-CM | POA: Diagnosis not present

## 2019-11-02 DIAGNOSIS — Z0389 Encounter for observation for other suspected diseases and conditions ruled out: Secondary | ICD-10-CM | POA: Diagnosis not present

## 2019-11-02 DIAGNOSIS — E1165 Type 2 diabetes mellitus with hyperglycemia: Secondary | ICD-10-CM | POA: Diagnosis not present

## 2019-11-02 DIAGNOSIS — E785 Hyperlipidemia, unspecified: Secondary | ICD-10-CM | POA: Diagnosis not present

## 2019-11-26 IMAGING — DX PORTABLE CHEST - 1 VIEW
1 series · 1 of 1 positions shown · non-contrast
Comparison: 10/07/2016

CLINICAL DATA: Cough. Former smoker. History of diabetes and
hypertension.

EXAM:
PORTABLE CHEST 1 VIEW

[chest ap]
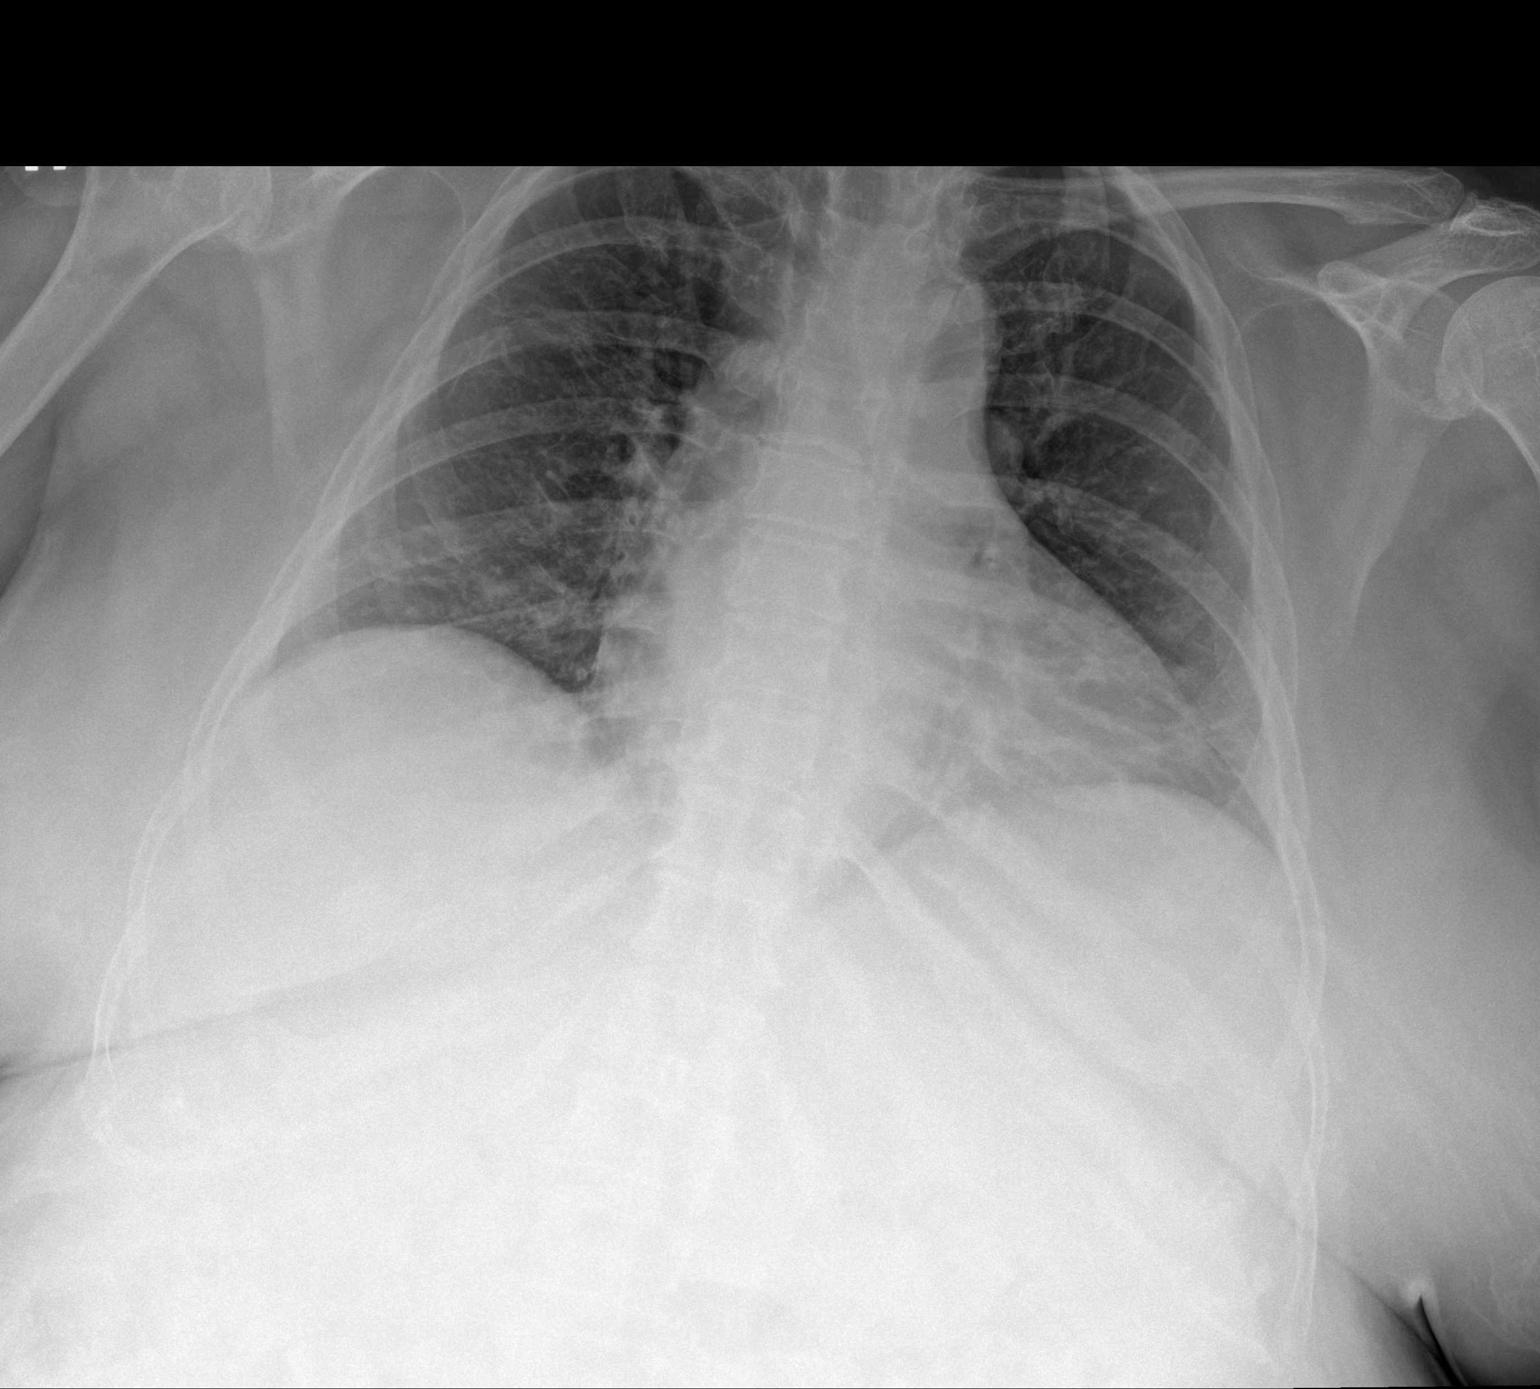

[1 of 1 positions shown; findings below may reference images not displayed]

FINDINGS: Cardiac silhouette top-normal in size. No mediastinal or hilar
masses or convincing adenopathy.

Clear lungs.  No pleural effusion or pneumothorax.

Skeletal structures are grossly intact.
IMPRESSION: No active disease.

## 2019-12-07 DIAGNOSIS — Z794 Long term (current) use of insulin: Secondary | ICD-10-CM | POA: Diagnosis not present

## 2019-12-07 DIAGNOSIS — Z0389 Encounter for observation for other suspected diseases and conditions ruled out: Secondary | ICD-10-CM | POA: Diagnosis not present

## 2019-12-07 DIAGNOSIS — E1165 Type 2 diabetes mellitus with hyperglycemia: Secondary | ICD-10-CM | POA: Diagnosis not present

## 2019-12-07 DIAGNOSIS — E209 Hypoparathyroidism, unspecified: Secondary | ICD-10-CM | POA: Diagnosis not present

## 2019-12-07 DIAGNOSIS — I10 Essential (primary) hypertension: Secondary | ICD-10-CM | POA: Diagnosis not present

## 2019-12-11 DIAGNOSIS — R2243 Localized swelling, mass and lump, lower limb, bilateral: Secondary | ICD-10-CM | POA: Diagnosis present

## 2019-12-11 DIAGNOSIS — I2510 Atherosclerotic heart disease of native coronary artery without angina pectoris: Secondary | ICD-10-CM | POA: Diagnosis present

## 2019-12-11 DIAGNOSIS — I10 Essential (primary) hypertension: Secondary | ICD-10-CM | POA: Diagnosis present

## 2019-12-11 DIAGNOSIS — F339 Major depressive disorder, recurrent, unspecified: Secondary | ICD-10-CM | POA: Diagnosis not present

## 2019-12-11 DIAGNOSIS — R45850 Homicidal ideations: Secondary | ICD-10-CM | POA: Diagnosis present

## 2019-12-11 DIAGNOSIS — E119 Type 2 diabetes mellitus without complications: Secondary | ICD-10-CM | POA: Diagnosis present

## 2019-12-11 DIAGNOSIS — R45851 Suicidal ideations: Secondary | ICD-10-CM | POA: Diagnosis present

## 2019-12-11 DIAGNOSIS — F209 Schizophrenia, unspecified: Secondary | ICD-10-CM | POA: Diagnosis present

## 2019-12-11 DIAGNOSIS — E785 Hyperlipidemia, unspecified: Secondary | ICD-10-CM | POA: Diagnosis present

## 2019-12-11 DIAGNOSIS — Z7984 Long term (current) use of oral hypoglycemic drugs: Secondary | ICD-10-CM | POA: Diagnosis not present

## 2019-12-11 DIAGNOSIS — E559 Vitamin D deficiency, unspecified: Secondary | ICD-10-CM | POA: Diagnosis present

## 2019-12-11 DIAGNOSIS — E039 Hypothyroidism, unspecified: Secondary | ICD-10-CM | POA: Diagnosis present

## 2019-12-11 DIAGNOSIS — Z62810 Personal history of physical and sexual abuse in childhood: Secondary | ICD-10-CM | POA: Diagnosis present

## 2019-12-11 DIAGNOSIS — F909 Attention-deficit hyperactivity disorder, unspecified type: Secondary | ICD-10-CM | POA: Diagnosis present
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
## 2019-12-29 DIAGNOSIS — N390 Urinary tract infection, site not specified: Secondary | ICD-10-CM | POA: Diagnosis not present

## 2020-01-08 DIAGNOSIS — R180 Malignant ascites: Secondary | ICD-10-CM | POA: Diagnosis not present

## 2020-01-08 DIAGNOSIS — C801 Malignant (primary) neoplasm, unspecified: Secondary | ICD-10-CM | POA: Diagnosis not present

## 2020-01-08 DIAGNOSIS — C569 Malignant neoplasm of unspecified ovary: Secondary | ICD-10-CM | POA: Diagnosis not present

## 2020-01-09 DIAGNOSIS — E1169 Type 2 diabetes mellitus with other specified complication: Secondary | ICD-10-CM | POA: Diagnosis not present

## 2020-01-09 DIAGNOSIS — G9340 Encephalopathy, unspecified: Secondary | ICD-10-CM | POA: Diagnosis not present

## 2020-01-09 DIAGNOSIS — I959 Hypotension, unspecified: Secondary | ICD-10-CM | POA: Diagnosis not present

## 2020-01-09 DIAGNOSIS — E669 Obesity, unspecified: Secondary | ICD-10-CM | POA: Diagnosis not present

## 2020-01-09 DIAGNOSIS — C530 Malignant neoplasm of endocervix: Secondary | ICD-10-CM | POA: Diagnosis not present

## 2020-01-09 DIAGNOSIS — C786 Secondary malignant neoplasm of retroperitoneum and peritoneum: Secondary | ICD-10-CM | POA: Diagnosis not present

## 2020-01-09 DIAGNOSIS — J9601 Acute respiratory failure with hypoxia: Secondary | ICD-10-CM | POA: Diagnosis not present

## 2020-01-09 DIAGNOSIS — F209 Schizophrenia, unspecified: Secondary | ICD-10-CM | POA: Diagnosis not present

## 2020-01-09 DIAGNOSIS — E876 Hypokalemia: Secondary | ICD-10-CM | POA: Diagnosis not present

## 2020-01-09 DIAGNOSIS — G9009 Other idiopathic peripheral autonomic neuropathy: Secondary | ICD-10-CM | POA: Diagnosis not present

## 2020-01-09 DIAGNOSIS — R180 Malignant ascites: Secondary | ICD-10-CM | POA: Diagnosis not present

## 2020-01-14 DIAGNOSIS — R9431 Abnormal electrocardiogram [ECG] [EKG]: Secondary | ICD-10-CM | POA: Diagnosis not present

## 2020-01-17 DIAGNOSIS — R5383 Other fatigue: Secondary | ICD-10-CM | POA: Diagnosis not present

## 2020-01-17 DIAGNOSIS — Z20822 Contact with and (suspected) exposure to covid-19: Secondary | ICD-10-CM | POA: Diagnosis not present

## 2020-01-17 DIAGNOSIS — Z859 Personal history of malignant neoplasm, unspecified: Secondary | ICD-10-CM | POA: Diagnosis not present

## 2020-01-20 DIAGNOSIS — F209 Schizophrenia, unspecified: Secondary | ICD-10-CM | POA: Diagnosis not present

## 2020-01-20 DIAGNOSIS — R404 Transient alteration of awareness: Secondary | ICD-10-CM | POA: Diagnosis not present

## 2020-01-20 DIAGNOSIS — R918 Other nonspecific abnormal finding of lung field: Secondary | ICD-10-CM | POA: Diagnosis not present

## 2020-01-20 DIAGNOSIS — C530 Malignant neoplasm of endocervix: Secondary | ICD-10-CM | POA: Diagnosis not present

## 2020-01-20 DIAGNOSIS — R180 Malignant ascites: Secondary | ICD-10-CM | POA: Diagnosis not present

## 2020-01-20 DIAGNOSIS — Z20822 Contact with and (suspected) exposure to covid-19: Secondary | ICD-10-CM | POA: Diagnosis not present

## 2020-01-20 DIAGNOSIS — E039 Hypothyroidism, unspecified: Secondary | ICD-10-CM | POA: Diagnosis not present

## 2020-01-20 DIAGNOSIS — R9431 Abnormal electrocardiogram [ECG] [EKG]: Secondary | ICD-10-CM | POA: Diagnosis not present

## 2020-01-20 DIAGNOSIS — R45851 Suicidal ideations: Secondary | ICD-10-CM | POA: Diagnosis not present

## 2020-01-20 DIAGNOSIS — N179 Acute kidney failure, unspecified: Secondary | ICD-10-CM | POA: Diagnosis not present

## 2020-01-20 DIAGNOSIS — E871 Hypo-osmolality and hyponatremia: Secondary | ICD-10-CM | POA: Diagnosis not present

## 2020-01-20 DIAGNOSIS — R930 Abnormal findings on diagnostic imaging of skull and head, not elsewhere classified: Secondary | ICD-10-CM | POA: Diagnosis not present

## 2020-01-20 DIAGNOSIS — C7989 Secondary malignant neoplasm of other specified sites: Secondary | ICD-10-CM | POA: Diagnosis not present

## 2020-01-20 DIAGNOSIS — C541 Malignant neoplasm of endometrium: Secondary | ICD-10-CM | POA: Diagnosis not present

## 2020-01-22 DIAGNOSIS — R9439 Abnormal result of other cardiovascular function study: Secondary | ICD-10-CM | POA: Diagnosis not present

## 2020-01-22 DIAGNOSIS — I358 Other nonrheumatic aortic valve disorders: Secondary | ICD-10-CM | POA: Diagnosis not present

## 2020-01-22 DIAGNOSIS — I342 Nonrheumatic mitral (valve) stenosis: Secondary | ICD-10-CM | POA: Diagnosis not present

## 2020-01-23 DIAGNOSIS — F2089 Other schizophrenia: Secondary | ICD-10-CM | POA: Diagnosis not present

## 2020-01-23 DIAGNOSIS — R45851 Suicidal ideations: Secondary | ICD-10-CM | POA: Diagnosis not present

## 2020-01-23 DIAGNOSIS — R188 Other ascites: Secondary | ICD-10-CM | POA: Diagnosis not present

## 2020-01-23 DIAGNOSIS — F203 Undifferentiated schizophrenia: Secondary | ICD-10-CM | POA: Diagnosis not present

## 2020-01-24 DIAGNOSIS — F2089 Other schizophrenia: Secondary | ICD-10-CM | POA: Diagnosis not present

## 2020-01-24 DIAGNOSIS — F203 Undifferentiated schizophrenia: Secondary | ICD-10-CM | POA: Diagnosis not present

## 2020-01-24 DIAGNOSIS — R45851 Suicidal ideations: Secondary | ICD-10-CM | POA: Diagnosis not present

## 2020-01-25 DIAGNOSIS — R45851 Suicidal ideations: Secondary | ICD-10-CM | POA: Diagnosis not present

## 2020-01-25 DIAGNOSIS — F2089 Other schizophrenia: Secondary | ICD-10-CM | POA: Diagnosis not present

## 2020-01-26 DIAGNOSIS — R180 Malignant ascites: Secondary | ICD-10-CM | POA: Diagnosis not present

## 2020-01-26 DIAGNOSIS — F203 Undifferentiated schizophrenia: Secondary | ICD-10-CM | POA: Diagnosis not present

## 2020-01-27 DIAGNOSIS — F203 Undifferentiated schizophrenia: Secondary | ICD-10-CM | POA: Diagnosis not present

## 2020-01-27 DIAGNOSIS — E871 Hypo-osmolality and hyponatremia: Secondary | ICD-10-CM | POA: Diagnosis not present

## 2020-01-27 DIAGNOSIS — R180 Malignant ascites: Secondary | ICD-10-CM | POA: Diagnosis not present

## 2020-01-27 DIAGNOSIS — E119 Type 2 diabetes mellitus without complications: Secondary | ICD-10-CM | POA: Diagnosis not present

## 2020-01-27 DIAGNOSIS — E039 Hypothyroidism, unspecified: Secondary | ICD-10-CM | POA: Diagnosis not present

## 2020-01-27 DIAGNOSIS — F209 Schizophrenia, unspecified: Secondary | ICD-10-CM | POA: Diagnosis not present

## 2020-01-28 DIAGNOSIS — E039 Hypothyroidism, unspecified: Secondary | ICD-10-CM | POA: Diagnosis not present

## 2020-01-28 DIAGNOSIS — E871 Hypo-osmolality and hyponatremia: Secondary | ICD-10-CM | POA: Diagnosis not present

## 2020-01-28 DIAGNOSIS — R180 Malignant ascites: Secondary | ICD-10-CM | POA: Diagnosis not present

## 2020-01-28 DIAGNOSIS — F203 Undifferentiated schizophrenia: Secondary | ICD-10-CM | POA: Diagnosis not present

## 2020-01-28 DIAGNOSIS — F209 Schizophrenia, unspecified: Secondary | ICD-10-CM | POA: Diagnosis not present

## 2020-01-28 DIAGNOSIS — E119 Type 2 diabetes mellitus without complications: Secondary | ICD-10-CM | POA: Diagnosis not present

## 2020-01-29 DIAGNOSIS — E871 Hypo-osmolality and hyponatremia: Secondary | ICD-10-CM | POA: Diagnosis not present

## 2020-01-29 DIAGNOSIS — F209 Schizophrenia, unspecified: Secondary | ICD-10-CM | POA: Diagnosis not present

## 2020-01-29 DIAGNOSIS — E039 Hypothyroidism, unspecified: Secondary | ICD-10-CM | POA: Diagnosis not present

## 2020-01-29 DIAGNOSIS — R180 Malignant ascites: Secondary | ICD-10-CM | POA: Diagnosis not present

## 2020-01-29 DIAGNOSIS — E119 Type 2 diabetes mellitus without complications: Secondary | ICD-10-CM | POA: Diagnosis not present

## 2020-01-29 DIAGNOSIS — F203 Undifferentiated schizophrenia: Secondary | ICD-10-CM | POA: Diagnosis not present

## 2020-01-30 DIAGNOSIS — E039 Hypothyroidism, unspecified: Secondary | ICD-10-CM | POA: Diagnosis not present

## 2020-01-30 DIAGNOSIS — F203 Undifferentiated schizophrenia: Secondary | ICD-10-CM | POA: Diagnosis not present

## 2020-01-30 DIAGNOSIS — E871 Hypo-osmolality and hyponatremia: Secondary | ICD-10-CM | POA: Diagnosis not present

## 2020-01-30 DIAGNOSIS — E119 Type 2 diabetes mellitus without complications: Secondary | ICD-10-CM | POA: Diagnosis not present

## 2020-01-30 DIAGNOSIS — R188 Other ascites: Secondary | ICD-10-CM | POA: Diagnosis not present

## 2020-01-30 DIAGNOSIS — R180 Malignant ascites: Secondary | ICD-10-CM | POA: Diagnosis not present

## 2020-01-30 DIAGNOSIS — F209 Schizophrenia, unspecified: Secondary | ICD-10-CM | POA: Diagnosis not present

## 2020-01-31 DIAGNOSIS — E119 Type 2 diabetes mellitus without complications: Secondary | ICD-10-CM | POA: Diagnosis not present

## 2020-01-31 DIAGNOSIS — F209 Schizophrenia, unspecified: Secondary | ICD-10-CM | POA: Diagnosis not present

## 2020-01-31 DIAGNOSIS — E871 Hypo-osmolality and hyponatremia: Secondary | ICD-10-CM | POA: Diagnosis not present

## 2020-01-31 DIAGNOSIS — R180 Malignant ascites: Secondary | ICD-10-CM | POA: Diagnosis not present

## 2020-01-31 DIAGNOSIS — R918 Other nonspecific abnormal finding of lung field: Secondary | ICD-10-CM | POA: Diagnosis not present

## 2020-01-31 DIAGNOSIS — E039 Hypothyroidism, unspecified: Secondary | ICD-10-CM | POA: Diagnosis not present

## 2020-02-01 DIAGNOSIS — R180 Malignant ascites: Secondary | ICD-10-CM | POA: Diagnosis not present

## 2020-02-01 DIAGNOSIS — R45851 Suicidal ideations: Secondary | ICD-10-CM | POA: Diagnosis not present

## 2020-02-01 DIAGNOSIS — R9431 Abnormal electrocardiogram [ECG] [EKG]: Secondary | ICD-10-CM | POA: Diagnosis not present

## 2020-02-01 DIAGNOSIS — E871 Hypo-osmolality and hyponatremia: Secondary | ICD-10-CM | POA: Diagnosis not present

## 2020-02-01 DIAGNOSIS — E119 Type 2 diabetes mellitus without complications: Secondary | ICD-10-CM | POA: Diagnosis not present

## 2020-02-01 DIAGNOSIS — F209 Schizophrenia, unspecified: Secondary | ICD-10-CM | POA: Diagnosis not present

## 2020-02-01 DIAGNOSIS — E039 Hypothyroidism, unspecified: Secondary | ICD-10-CM | POA: Diagnosis not present

## 2020-02-02 DIAGNOSIS — Z79899 Other long term (current) drug therapy: Secondary | ICD-10-CM | POA: Diagnosis not present

## 2020-02-02 DIAGNOSIS — E875 Hyperkalemia: Secondary | ICD-10-CM | POA: Diagnosis present

## 2020-02-02 DIAGNOSIS — K5900 Constipation, unspecified: Secondary | ICD-10-CM | POA: Diagnosis not present

## 2020-02-02 DIAGNOSIS — F209 Schizophrenia, unspecified: Secondary | ICD-10-CM | POA: Diagnosis present

## 2020-02-02 DIAGNOSIS — E1122 Type 2 diabetes mellitus with diabetic chronic kidney disease: Secondary | ICD-10-CM | POA: Diagnosis present

## 2020-02-02 DIAGNOSIS — R188 Other ascites: Secondary | ICD-10-CM | POA: Diagnosis not present

## 2020-02-02 DIAGNOSIS — E669 Obesity, unspecified: Secondary | ICD-10-CM | POA: Diagnosis present

## 2020-02-02 DIAGNOSIS — C530 Malignant neoplasm of endocervix: Secondary | ICD-10-CM | POA: Diagnosis present

## 2020-02-02 DIAGNOSIS — N189 Chronic kidney disease, unspecified: Secondary | ICD-10-CM | POA: Diagnosis present

## 2020-02-02 DIAGNOSIS — F17210 Nicotine dependence, cigarettes, uncomplicated: Secondary | ICD-10-CM | POA: Diagnosis present

## 2020-02-02 DIAGNOSIS — E871 Hypo-osmolality and hyponatremia: Secondary | ICD-10-CM | POA: Diagnosis present

## 2020-02-02 DIAGNOSIS — R45851 Suicidal ideations: Secondary | ICD-10-CM | POA: Diagnosis present

## 2020-02-02 DIAGNOSIS — R180 Malignant ascites: Secondary | ICD-10-CM | POA: Diagnosis present

## 2020-02-02 DIAGNOSIS — K7460 Unspecified cirrhosis of liver: Secondary | ICD-10-CM | POA: Diagnosis present

## 2020-02-02 DIAGNOSIS — R9431 Abnormal electrocardiogram [ECG] [EKG]: Secondary | ICD-10-CM | POA: Diagnosis present

## 2020-02-02 DIAGNOSIS — T50905A Adverse effect of unspecified drugs, medicaments and biological substances, initial encounter: Secondary | ICD-10-CM | POA: Diagnosis present

## 2020-02-02 DIAGNOSIS — Z888 Allergy status to other drugs, medicaments and biological substances status: Secondary | ICD-10-CM | POA: Diagnosis not present

## 2020-02-02 DIAGNOSIS — E1142 Type 2 diabetes mellitus with diabetic polyneuropathy: Secondary | ICD-10-CM | POA: Diagnosis present

## 2020-02-02 DIAGNOSIS — N390 Urinary tract infection, site not specified: Secondary | ICD-10-CM | POA: Diagnosis present

## 2020-02-02 DIAGNOSIS — K5641 Fecal impaction: Secondary | ICD-10-CM | POA: Diagnosis not present

## 2020-02-02 DIAGNOSIS — E119 Type 2 diabetes mellitus without complications: Secondary | ICD-10-CM | POA: Diagnosis not present

## 2020-02-02 DIAGNOSIS — I129 Hypertensive chronic kidney disease with stage 1 through stage 4 chronic kidney disease, or unspecified chronic kidney disease: Secondary | ICD-10-CM | POA: Diagnosis present

## 2020-02-02 DIAGNOSIS — G9341 Metabolic encephalopathy: Secondary | ICD-10-CM | POA: Diagnosis present

## 2020-02-02 DIAGNOSIS — E869 Volume depletion, unspecified: Secondary | ICD-10-CM | POA: Diagnosis present

## 2020-02-02 DIAGNOSIS — C786 Secondary malignant neoplasm of retroperitoneum and peritoneum: Secondary | ICD-10-CM | POA: Diagnosis present

## 2020-02-02 DIAGNOSIS — Z20822 Contact with and (suspected) exposure to covid-19: Secondary | ICD-10-CM | POA: Diagnosis present

## 2020-02-02 DIAGNOSIS — E039 Hypothyroidism, unspecified: Secondary | ICD-10-CM | POA: Diagnosis present

## 2020-02-02 DIAGNOSIS — G4700 Insomnia, unspecified: Secondary | ICD-10-CM | POA: Diagnosis present

## 2020-02-03 DIAGNOSIS — E039 Hypothyroidism, unspecified: Secondary | ICD-10-CM | POA: Diagnosis not present

## 2020-02-03 DIAGNOSIS — E119 Type 2 diabetes mellitus without complications: Secondary | ICD-10-CM | POA: Diagnosis not present

## 2020-02-03 DIAGNOSIS — R188 Other ascites: Secondary | ICD-10-CM | POA: Diagnosis not present

## 2020-02-03 DIAGNOSIS — F209 Schizophrenia, unspecified: Secondary | ICD-10-CM | POA: Diagnosis not present

## 2020-02-03 DIAGNOSIS — E871 Hypo-osmolality and hyponatremia: Secondary | ICD-10-CM | POA: Diagnosis not present

## 2020-02-04 DIAGNOSIS — R45851 Suicidal ideations: Secondary | ICD-10-CM | POA: Diagnosis not present

## 2020-02-04 DIAGNOSIS — E871 Hypo-osmolality and hyponatremia: Secondary | ICD-10-CM | POA: Diagnosis not present

## 2020-02-04 DIAGNOSIS — F209 Schizophrenia, unspecified: Secondary | ICD-10-CM | POA: Diagnosis not present

## 2020-02-04 DIAGNOSIS — E039 Hypothyroidism, unspecified: Secondary | ICD-10-CM | POA: Diagnosis not present

## 2020-02-04 DIAGNOSIS — K5641 Fecal impaction: Secondary | ICD-10-CM | POA: Diagnosis not present

## 2020-02-04 DIAGNOSIS — E119 Type 2 diabetes mellitus without complications: Secondary | ICD-10-CM | POA: Diagnosis not present

## 2020-02-05 DIAGNOSIS — F209 Schizophrenia, unspecified: Secondary | ICD-10-CM | POA: Diagnosis not present

## 2020-02-05 DIAGNOSIS — E119 Type 2 diabetes mellitus without complications: Secondary | ICD-10-CM | POA: Diagnosis not present

## 2020-02-05 DIAGNOSIS — E039 Hypothyroidism, unspecified: Secondary | ICD-10-CM | POA: Diagnosis not present

## 2020-02-05 DIAGNOSIS — E871 Hypo-osmolality and hyponatremia: Secondary | ICD-10-CM | POA: Diagnosis not present

## 2020-02-06 DIAGNOSIS — E119 Type 2 diabetes mellitus without complications: Secondary | ICD-10-CM | POA: Diagnosis not present

## 2020-02-06 DIAGNOSIS — F209 Schizophrenia, unspecified: Secondary | ICD-10-CM | POA: Diagnosis not present

## 2020-02-06 DIAGNOSIS — E871 Hypo-osmolality and hyponatremia: Secondary | ICD-10-CM | POA: Diagnosis not present

## 2020-02-06 DIAGNOSIS — R188 Other ascites: Secondary | ICD-10-CM | POA: Diagnosis not present

## 2020-02-06 DIAGNOSIS — E039 Hypothyroidism, unspecified: Secondary | ICD-10-CM | POA: Diagnosis not present

## 2020-02-07 DIAGNOSIS — F200 Paranoid schizophrenia: Secondary | ICD-10-CM | POA: Diagnosis present

## 2020-02-07 DIAGNOSIS — I10 Essential (primary) hypertension: Secondary | ICD-10-CM | POA: Diagnosis present

## 2020-02-07 DIAGNOSIS — C539 Malignant neoplasm of cervix uteri, unspecified: Secondary | ICD-10-CM | POA: Diagnosis present

## 2020-02-07 DIAGNOSIS — R45851 Suicidal ideations: Secondary | ICD-10-CM | POA: Diagnosis present

## 2020-02-07 DIAGNOSIS — Z736 Limitation of activities due to disability: Secondary | ICD-10-CM | POA: Diagnosis not present

## 2020-02-07 DIAGNOSIS — F332 Major depressive disorder, recurrent severe without psychotic features: Secondary | ICD-10-CM | POA: Diagnosis not present

## 2020-02-07 DIAGNOSIS — R45850 Homicidal ideations: Secondary | ICD-10-CM | POA: Diagnosis present

## 2020-02-07 DIAGNOSIS — M79604 Pain in right leg: Secondary | ICD-10-CM | POA: Diagnosis not present

## 2020-02-07 DIAGNOSIS — M79605 Pain in left leg: Secondary | ICD-10-CM | POA: Diagnosis not present

## 2020-02-07 DIAGNOSIS — Z9151 Personal history of suicidal behavior: Secondary | ICD-10-CM | POA: Diagnosis not present

## 2020-02-07 DIAGNOSIS — Z20822 Contact with and (suspected) exposure to covid-19: Secondary | ICD-10-CM | POA: Diagnosis not present

## 2020-02-07 DIAGNOSIS — Z593 Problems related to living in residential institution: Secondary | ICD-10-CM | POA: Diagnosis not present

## 2020-02-07 DIAGNOSIS — C786 Secondary malignant neoplasm of retroperitoneum and peritoneum: Secondary | ICD-10-CM | POA: Diagnosis present

## 2020-02-07 DIAGNOSIS — E039 Hypothyroidism, unspecified: Secondary | ICD-10-CM | POA: Diagnosis present

## 2020-02-07 DIAGNOSIS — E119 Type 2 diabetes mellitus without complications: Secondary | ICD-10-CM | POA: Diagnosis present

## 2020-02-07 DIAGNOSIS — K7460 Unspecified cirrhosis of liver: Secondary | ICD-10-CM | POA: Diagnosis present

## 2020-02-07 DIAGNOSIS — F209 Schizophrenia, unspecified: Secondary | ICD-10-CM | POA: Diagnosis not present

## 2020-02-07 DIAGNOSIS — R180 Malignant ascites: Secondary | ICD-10-CM | POA: Diagnosis present

## 2020-02-07 DIAGNOSIS — N183 Chronic kidney disease, stage 3 unspecified: Secondary | ICD-10-CM | POA: Diagnosis not present

## 2020-02-08 DIAGNOSIS — Z79890 Hormone replacement therapy (postmenopausal): Secondary | ICD-10-CM | POA: Diagnosis not present

## 2020-02-08 DIAGNOSIS — R140 Abdominal distension (gaseous): Secondary | ICD-10-CM | POA: Diagnosis not present

## 2020-02-08 DIAGNOSIS — Z736 Limitation of activities due to disability: Secondary | ICD-10-CM | POA: Diagnosis not present

## 2020-02-08 DIAGNOSIS — Z515 Encounter for palliative care: Secondary | ICD-10-CM | POA: Diagnosis not present

## 2020-02-08 DIAGNOSIS — F17210 Nicotine dependence, cigarettes, uncomplicated: Secondary | ICD-10-CM | POA: Diagnosis present

## 2020-02-08 DIAGNOSIS — C7931 Secondary malignant neoplasm of brain: Secondary | ICD-10-CM | POA: Diagnosis present

## 2020-02-08 DIAGNOSIS — C787 Secondary malignant neoplasm of liver and intrahepatic bile duct: Secondary | ICD-10-CM | POA: Diagnosis not present

## 2020-02-08 DIAGNOSIS — Z66 Do not resuscitate: Secondary | ICD-10-CM | POA: Diagnosis not present

## 2020-02-08 DIAGNOSIS — R451 Restlessness and agitation: Secondary | ICD-10-CM | POA: Diagnosis not present

## 2020-02-08 DIAGNOSIS — E222 Syndrome of inappropriate secretion of antidiuretic hormone: Secondary | ICD-10-CM | POA: Diagnosis not present

## 2020-02-08 DIAGNOSIS — F209 Schizophrenia, unspecified: Secondary | ICD-10-CM | POA: Diagnosis not present

## 2020-02-08 DIAGNOSIS — M79604 Pain in right leg: Secondary | ICD-10-CM | POA: Diagnosis not present

## 2020-02-08 DIAGNOSIS — E039 Hypothyroidism, unspecified: Secondary | ICD-10-CM | POA: Diagnosis present

## 2020-02-08 DIAGNOSIS — F259 Schizoaffective disorder, unspecified: Secondary | ICD-10-CM | POA: Diagnosis present

## 2020-02-08 DIAGNOSIS — E875 Hyperkalemia: Secondary | ICD-10-CM | POA: Diagnosis present

## 2020-02-08 DIAGNOSIS — R180 Malignant ascites: Secondary | ICD-10-CM | POA: Diagnosis not present

## 2020-02-08 DIAGNOSIS — M79605 Pain in left leg: Secondary | ICD-10-CM | POA: Diagnosis not present

## 2020-02-08 DIAGNOSIS — I10 Essential (primary) hypertension: Secondary | ICD-10-CM | POA: Diagnosis not present

## 2020-02-08 DIAGNOSIS — I129 Hypertensive chronic kidney disease with stage 1 through stage 4 chronic kidney disease, or unspecified chronic kidney disease: Secondary | ICD-10-CM | POA: Diagnosis present

## 2020-02-08 DIAGNOSIS — F603 Borderline personality disorder: Secondary | ICD-10-CM | POA: Diagnosis present

## 2020-02-08 DIAGNOSIS — G629 Polyneuropathy, unspecified: Secondary | ICD-10-CM | POA: Diagnosis present

## 2020-02-08 DIAGNOSIS — K7460 Unspecified cirrhosis of liver: Secondary | ICD-10-CM | POA: Diagnosis not present

## 2020-02-08 DIAGNOSIS — E1122 Type 2 diabetes mellitus with diabetic chronic kidney disease: Secondary | ICD-10-CM | POA: Diagnosis present

## 2020-02-08 DIAGNOSIS — F332 Major depressive disorder, recurrent severe without psychotic features: Secondary | ICD-10-CM | POA: Diagnosis not present

## 2020-02-08 DIAGNOSIS — E871 Hypo-osmolality and hyponatremia: Secondary | ICD-10-CM | POA: Diagnosis not present

## 2020-02-08 DIAGNOSIS — Z593 Problems related to living in residential institution: Secondary | ICD-10-CM | POA: Diagnosis not present

## 2020-02-08 DIAGNOSIS — C7800 Secondary malignant neoplasm of unspecified lung: Secondary | ICD-10-CM | POA: Diagnosis not present

## 2020-02-08 DIAGNOSIS — C539 Malignant neoplasm of cervix uteri, unspecified: Secondary | ICD-10-CM | POA: Diagnosis not present

## 2020-02-08 DIAGNOSIS — C786 Secondary malignant neoplasm of retroperitoneum and peritoneum: Secondary | ICD-10-CM | POA: Diagnosis present

## 2020-02-08 DIAGNOSIS — N183 Chronic kidney disease, stage 3 unspecified: Secondary | ICD-10-CM | POA: Diagnosis not present

## 2020-02-08 DIAGNOSIS — R410 Disorientation, unspecified: Secondary | ICD-10-CM | POA: Diagnosis not present

## 2020-02-08 DIAGNOSIS — R188 Other ascites: Secondary | ICD-10-CM | POA: Diagnosis not present

## 2020-02-08 DIAGNOSIS — Z794 Long term (current) use of insulin: Secondary | ICD-10-CM | POA: Diagnosis not present

## 2020-02-08 DIAGNOSIS — Z20822 Contact with and (suspected) exposure to covid-19: Secondary | ICD-10-CM | POA: Diagnosis not present

## 2020-02-08 DIAGNOSIS — Z79899 Other long term (current) drug therapy: Secondary | ICD-10-CM | POA: Diagnosis not present

## 2020-02-08 DIAGNOSIS — N189 Chronic kidney disease, unspecified: Secondary | ICD-10-CM | POA: Diagnosis present

## 2020-02-08 DIAGNOSIS — C530 Malignant neoplasm of endocervix: Secondary | ICD-10-CM | POA: Diagnosis not present

## 2020-02-08 DIAGNOSIS — R1084 Generalized abdominal pain: Secondary | ICD-10-CM | POA: Diagnosis not present

## 2020-02-09 DIAGNOSIS — C530 Malignant neoplasm of endocervix: Secondary | ICD-10-CM | POA: Diagnosis present

## 2020-02-09 DIAGNOSIS — G629 Polyneuropathy, unspecified: Secondary | ICD-10-CM | POA: Diagnosis present

## 2020-02-09 DIAGNOSIS — Z79890 Hormone replacement therapy (postmenopausal): Secondary | ICD-10-CM | POA: Diagnosis not present

## 2020-02-09 DIAGNOSIS — E871 Hypo-osmolality and hyponatremia: Secondary | ICD-10-CM | POA: Diagnosis not present

## 2020-02-09 DIAGNOSIS — E222 Syndrome of inappropriate secretion of antidiuretic hormone: Secondary | ICD-10-CM | POA: Diagnosis present

## 2020-02-09 DIAGNOSIS — N189 Chronic kidney disease, unspecified: Secondary | ICD-10-CM | POA: Diagnosis present

## 2020-02-09 DIAGNOSIS — C786 Secondary malignant neoplasm of retroperitoneum and peritoneum: Secondary | ICD-10-CM | POA: Diagnosis present

## 2020-02-09 DIAGNOSIS — R410 Disorientation, unspecified: Secondary | ICD-10-CM | POA: Diagnosis not present

## 2020-02-09 DIAGNOSIS — C539 Malignant neoplasm of cervix uteri, unspecified: Secondary | ICD-10-CM | POA: Diagnosis not present

## 2020-02-09 DIAGNOSIS — R451 Restlessness and agitation: Secondary | ICD-10-CM | POA: Diagnosis not present

## 2020-02-09 DIAGNOSIS — E039 Hypothyroidism, unspecified: Secondary | ICD-10-CM | POA: Diagnosis present

## 2020-02-09 DIAGNOSIS — F259 Schizoaffective disorder, unspecified: Secondary | ICD-10-CM | POA: Diagnosis present

## 2020-02-09 DIAGNOSIS — F603 Borderline personality disorder: Secondary | ICD-10-CM | POA: Diagnosis present

## 2020-02-09 DIAGNOSIS — E875 Hyperkalemia: Secondary | ICD-10-CM | POA: Diagnosis present

## 2020-02-09 DIAGNOSIS — R188 Other ascites: Secondary | ICD-10-CM | POA: Diagnosis not present

## 2020-02-09 DIAGNOSIS — R140 Abdominal distension (gaseous): Secondary | ICD-10-CM | POA: Diagnosis not present

## 2020-02-09 DIAGNOSIS — Z515 Encounter for palliative care: Secondary | ICD-10-CM | POA: Diagnosis not present

## 2020-02-09 DIAGNOSIS — R180 Malignant ascites: Secondary | ICD-10-CM | POA: Diagnosis not present

## 2020-02-09 DIAGNOSIS — Z79899 Other long term (current) drug therapy: Secondary | ICD-10-CM | POA: Diagnosis not present

## 2020-02-09 DIAGNOSIS — C787 Secondary malignant neoplasm of liver and intrahepatic bile duct: Secondary | ICD-10-CM | POA: Diagnosis present

## 2020-02-09 DIAGNOSIS — Z794 Long term (current) use of insulin: Secondary | ICD-10-CM | POA: Diagnosis not present

## 2020-02-09 DIAGNOSIS — C7931 Secondary malignant neoplasm of brain: Secondary | ICD-10-CM | POA: Diagnosis present

## 2020-02-09 DIAGNOSIS — Z20822 Contact with and (suspected) exposure to covid-19: Secondary | ICD-10-CM | POA: Diagnosis present

## 2020-02-09 DIAGNOSIS — Z66 Do not resuscitate: Secondary | ICD-10-CM | POA: Diagnosis not present

## 2020-02-09 DIAGNOSIS — R1084 Generalized abdominal pain: Secondary | ICD-10-CM | POA: Diagnosis not present

## 2020-02-09 DIAGNOSIS — F17210 Nicotine dependence, cigarettes, uncomplicated: Secondary | ICD-10-CM | POA: Diagnosis present

## 2020-02-09 DIAGNOSIS — I129 Hypertensive chronic kidney disease with stage 1 through stage 4 chronic kidney disease, or unspecified chronic kidney disease: Secondary | ICD-10-CM | POA: Diagnosis present

## 2020-02-09 DIAGNOSIS — E1122 Type 2 diabetes mellitus with diabetic chronic kidney disease: Secondary | ICD-10-CM | POA: Diagnosis present

## 2020-02-09 DIAGNOSIS — C7800 Secondary malignant neoplasm of unspecified lung: Secondary | ICD-10-CM | POA: Diagnosis present

## 2020-02-09 DIAGNOSIS — F209 Schizophrenia, unspecified: Secondary | ICD-10-CM | POA: Diagnosis not present

## 2020-02-20 DEATH — deceased

## 2020-03-19 DEATH — deceased
